# Patient Record
Sex: Female | Born: 1950
Health system: Southern US, Community
[De-identification: ages and names within clinical notes are randomized; demographics above are authoritative.]

## PROBLEM LIST (undated history)

## (undated) DIAGNOSIS — E559 Vitamin D deficiency, unspecified: Secondary | ICD-10-CM

## (undated) DIAGNOSIS — N921 Excessive and frequent menstruation with irregular cycle: Secondary | ICD-10-CM

## (undated) DIAGNOSIS — I2510 Atherosclerotic heart disease of native coronary artery without angina pectoris: Secondary | ICD-10-CM

## (undated) DIAGNOSIS — K5792 Diverticulitis of intestine, part unspecified, without perforation or abscess without bleeding: Secondary | ICD-10-CM

## (undated) DIAGNOSIS — E039 Hypothyroidism, unspecified: Secondary | ICD-10-CM

## (undated) DIAGNOSIS — R011 Cardiac murmur, unspecified: Secondary | ICD-10-CM

## (undated) DIAGNOSIS — G4730 Sleep apnea, unspecified: Secondary | ICD-10-CM

## (undated) DIAGNOSIS — Z9221 Personal history of antineoplastic chemotherapy: Secondary | ICD-10-CM

## (undated) DIAGNOSIS — Z7901 Long term (current) use of anticoagulants: Secondary | ICD-10-CM

## (undated) DIAGNOSIS — I499 Cardiac arrhythmia, unspecified: Secondary | ICD-10-CM

## (undated) DIAGNOSIS — H9190 Unspecified hearing loss, unspecified ear: Secondary | ICD-10-CM

## (undated) DIAGNOSIS — G43909 Migraine, unspecified, not intractable, without status migrainosus: Secondary | ICD-10-CM

## (undated) DIAGNOSIS — N979 Female infertility, unspecified: Secondary | ICD-10-CM

## (undated) DIAGNOSIS — I219 Acute myocardial infarction, unspecified: Secondary | ICD-10-CM

## (undated) DIAGNOSIS — I509 Heart failure, unspecified: Secondary | ICD-10-CM

## (undated) DIAGNOSIS — C50919 Malignant neoplasm of unspecified site of unspecified female breast: Secondary | ICD-10-CM

## (undated) DIAGNOSIS — I10 Essential (primary) hypertension: Secondary | ICD-10-CM

## (undated) DIAGNOSIS — Z923 Personal history of irradiation: Secondary | ICD-10-CM

## (undated) DIAGNOSIS — A64 Unspecified sexually transmitted disease: Secondary | ICD-10-CM

## (undated) DIAGNOSIS — I639 Cerebral infarction, unspecified: Secondary | ICD-10-CM

## (undated) DIAGNOSIS — I4821 Permanent atrial fibrillation: Principal | ICD-10-CM

## (undated) DIAGNOSIS — M1990 Unspecified osteoarthritis, unspecified site: Secondary | ICD-10-CM

## (undated) HISTORY — DX: Essential (primary) hypertension: I10

## (undated) HISTORY — PX: PORT-A-CATH REMOVAL: SHX5289

## (undated) HISTORY — DX: Vitamin D deficiency, unspecified: E55.9

## (undated) HISTORY — DX: Excessive and frequent menstruation with irregular cycle: N92.1

## (undated) HISTORY — DX: Unspecified sexually transmitted disease: A64

## (undated) HISTORY — DX: Migraine, unspecified, not intractable, without status migrainosus: G43.909

## (undated) HISTORY — DX: Female infertility, unspecified: N97.9

## (undated) HISTORY — PX: BREAST LUMPECTOMY: SHX2

## (undated) HISTORY — DX: Permanent atrial fibrillation: I48.21

## (undated) HISTORY — DX: Cerebral infarction, unspecified: I63.9

## (undated) HISTORY — DX: Malignant neoplasm of unspecified site of unspecified female breast: C50.919

## (undated) HISTORY — DX: Acute myocardial infarction, unspecified: I21.9

## (undated) HISTORY — DX: Diverticulitis of intestine, part unspecified, without perforation or abscess without bleeding: K57.92

---
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
## 1974-06-10 DIAGNOSIS — A64 Unspecified sexually transmitted disease: Secondary | ICD-10-CM

## 1974-06-10 HISTORY — DX: Unspecified sexually transmitted disease: A64

## 2000-02-09 DIAGNOSIS — N979 Female infertility, unspecified: Secondary | ICD-10-CM

## 2000-02-09 DIAGNOSIS — G43909 Migraine, unspecified, not intractable, without status migrainosus: Secondary | ICD-10-CM

## 2000-02-09 HISTORY — DX: Female infertility, unspecified: N97.9

## 2000-02-09 HISTORY — DX: Migraine, unspecified, not intractable, without status migrainosus: G43.909

## 2000-02-26 ENCOUNTER — Other Ambulatory Visit: Admission: RE | Admit: 2000-02-26 | Discharge: 2000-02-26 | Payer: Self-pay | Admitting: *Deleted

## 2001-03-11 ENCOUNTER — Other Ambulatory Visit: Admission: RE | Admit: 2001-03-11 | Discharge: 2001-03-11 | Payer: Self-pay | Admitting: Obstetrics and Gynecology

## 2001-06-10 DIAGNOSIS — Z923 Personal history of irradiation: Secondary | ICD-10-CM

## 2001-06-10 DIAGNOSIS — Z9221 Personal history of antineoplastic chemotherapy: Secondary | ICD-10-CM

## 2001-06-10 HISTORY — PX: PORTA CATH INSERTION: CATH118285

## 2001-06-10 HISTORY — DX: Personal history of antineoplastic chemotherapy: Z92.21

## 2001-06-10 HISTORY — DX: Personal history of irradiation: Z92.3

## 2001-11-08 DIAGNOSIS — C50919 Malignant neoplasm of unspecified site of unspecified female breast: Secondary | ICD-10-CM

## 2001-11-08 HISTORY — DX: Malignant neoplasm of unspecified site of unspecified female breast: C50.919

## 2001-11-08 HISTORY — PX: BREAST SURGERY: SHX581

## 2001-11-27 ENCOUNTER — Encounter: Payer: Self-pay | Admitting: General Surgery

## 2001-11-27 ENCOUNTER — Encounter: Admission: RE | Admit: 2001-11-27 | Discharge: 2001-11-27 | Payer: Self-pay | Admitting: General Surgery

## 2001-11-27 ENCOUNTER — Encounter (INDEPENDENT_AMBULATORY_CARE_PROVIDER_SITE_OTHER): Payer: Self-pay | Admitting: Specialist

## 2001-11-30 ENCOUNTER — Other Ambulatory Visit: Admission: RE | Admit: 2001-11-30 | Discharge: 2001-11-30 | Payer: Self-pay | Admitting: *Deleted

## 2001-12-07 ENCOUNTER — Encounter: Payer: Self-pay | Admitting: General Surgery

## 2001-12-07 ENCOUNTER — Encounter: Admission: RE | Admit: 2001-12-07 | Discharge: 2001-12-07 | Payer: Self-pay | Admitting: General Surgery

## 2001-12-08 ENCOUNTER — Encounter (INDEPENDENT_AMBULATORY_CARE_PROVIDER_SITE_OTHER): Payer: Self-pay | Admitting: *Deleted

## 2001-12-08 ENCOUNTER — Encounter: Payer: Self-pay | Admitting: General Surgery

## 2001-12-08 ENCOUNTER — Ambulatory Visit (HOSPITAL_BASED_OUTPATIENT_CLINIC_OR_DEPARTMENT_OTHER): Admission: RE | Admit: 2001-12-08 | Discharge: 2001-12-08 | Payer: Self-pay | Admitting: General Surgery

## 2001-12-15 ENCOUNTER — Ambulatory Visit: Admission: RE | Admit: 2001-12-15 | Discharge: 2002-03-15 | Payer: Self-pay | Admitting: Radiation Oncology

## 2002-01-07 ENCOUNTER — Encounter: Payer: Self-pay | Admitting: General Surgery

## 2002-01-07 ENCOUNTER — Ambulatory Visit (HOSPITAL_BASED_OUTPATIENT_CLINIC_OR_DEPARTMENT_OTHER): Admission: RE | Admit: 2002-01-07 | Discharge: 2002-01-07 | Payer: Self-pay | Admitting: General Surgery

## 2002-01-15 ENCOUNTER — Inpatient Hospital Stay (HOSPITAL_COMMUNITY): Admission: EM | Admit: 2002-01-15 | Discharge: 2002-01-18 | Payer: Self-pay | Admitting: Oncology

## 2002-01-15 ENCOUNTER — Encounter: Payer: Self-pay | Admitting: Oncology

## 2002-02-27 ENCOUNTER — Encounter: Payer: Self-pay | Admitting: *Deleted

## 2002-02-27 ENCOUNTER — Inpatient Hospital Stay (HOSPITAL_COMMUNITY): Admission: EM | Admit: 2002-02-27 | Discharge: 2002-03-01 | Payer: Self-pay

## 2002-03-29 ENCOUNTER — Ambulatory Visit: Admission: RE | Admit: 2002-03-29 | Discharge: 2002-05-28 | Payer: Self-pay | Admitting: Radiation Oncology

## 2002-04-22 ENCOUNTER — Ambulatory Visit (HOSPITAL_BASED_OUTPATIENT_CLINIC_OR_DEPARTMENT_OTHER): Admission: RE | Admit: 2002-04-22 | Discharge: 2002-04-22 | Payer: Self-pay | Admitting: General Surgery

## 2002-05-25 ENCOUNTER — Other Ambulatory Visit: Admission: RE | Admit: 2002-05-25 | Discharge: 2002-05-25 | Payer: Self-pay | Admitting: Obstetrics and Gynecology

## 2002-06-10 HISTORY — PX: PORT-A-CATH REMOVAL: SHX5289

## 2002-07-01 ENCOUNTER — Ambulatory Visit: Admission: RE | Admit: 2002-07-01 | Discharge: 2002-07-01 | Payer: Self-pay | Admitting: Radiation Oncology

## 2002-07-26 ENCOUNTER — Ambulatory Visit (HOSPITAL_COMMUNITY): Admission: RE | Admit: 2002-07-26 | Discharge: 2002-07-26 | Payer: Self-pay | Admitting: Gastroenterology

## 2002-08-24 ENCOUNTER — Encounter: Admission: RE | Admit: 2002-08-24 | Discharge: 2002-08-24 | Payer: Self-pay | Admitting: General Surgery

## 2002-08-24 ENCOUNTER — Encounter: Payer: Self-pay | Admitting: General Surgery

## 2003-06-13 ENCOUNTER — Other Ambulatory Visit: Admission: RE | Admit: 2003-06-13 | Discharge: 2003-06-13 | Payer: Self-pay | Admitting: Obstetrics and Gynecology

## 2003-08-25 ENCOUNTER — Encounter: Admission: RE | Admit: 2003-08-25 | Discharge: 2003-08-25 | Payer: Self-pay | Admitting: General Surgery

## 2003-11-03 ENCOUNTER — Ambulatory Visit (HOSPITAL_BASED_OUTPATIENT_CLINIC_OR_DEPARTMENT_OTHER): Admission: RE | Admit: 2003-11-03 | Discharge: 2003-11-03 | Payer: Self-pay | Admitting: Otolaryngology

## 2003-11-03 ENCOUNTER — Ambulatory Visit (HOSPITAL_COMMUNITY): Admission: RE | Admit: 2003-11-03 | Discharge: 2003-11-03 | Payer: Self-pay | Admitting: Otolaryngology

## 2004-06-22 ENCOUNTER — Ambulatory Visit: Payer: Self-pay | Admitting: Hematology & Oncology

## 2004-06-25 ENCOUNTER — Other Ambulatory Visit: Admission: RE | Admit: 2004-06-25 | Discharge: 2004-06-25 | Payer: Self-pay | Admitting: Obstetrics and Gynecology

## 2004-08-27 ENCOUNTER — Encounter: Admission: RE | Admit: 2004-08-27 | Discharge: 2004-08-27 | Payer: Self-pay | Admitting: General Surgery

## 2004-12-14 ENCOUNTER — Ambulatory Visit: Payer: Self-pay | Admitting: Hematology & Oncology

## 2005-02-27 ENCOUNTER — Encounter: Admission: RE | Admit: 2005-02-27 | Discharge: 2005-02-27 | Payer: Self-pay | Admitting: Family Medicine

## 2005-06-14 ENCOUNTER — Ambulatory Visit: Payer: Self-pay | Admitting: Hematology & Oncology

## 2005-07-08 ENCOUNTER — Other Ambulatory Visit: Admission: RE | Admit: 2005-07-08 | Discharge: 2005-07-08 | Payer: Self-pay | Admitting: Obstetrics and Gynecology

## 2005-08-29 ENCOUNTER — Encounter: Admission: RE | Admit: 2005-08-29 | Discharge: 2005-08-29 | Payer: Self-pay | Admitting: General Surgery

## 2005-09-01 ENCOUNTER — Encounter: Admission: RE | Admit: 2005-09-01 | Discharge: 2005-09-01 | Payer: Self-pay | Admitting: General Surgery

## 2005-12-05 ENCOUNTER — Ambulatory Visit: Payer: Self-pay | Admitting: Hematology & Oncology

## 2005-12-13 LAB — COMPREHENSIVE METABOLIC PANEL
ALT: 15 U/L (ref 0–40)
AST: 16 U/L (ref 0–37)
Albumin: 4.3 g/dL (ref 3.5–5.2)
Alkaline Phosphatase: 46 U/L (ref 39–117)
BUN: 13 mg/dL (ref 6–23)
CO2: 25 mEq/L (ref 19–32)
Calcium: 9.3 mg/dL (ref 8.4–10.5)
Chloride: 104 mEq/L (ref 96–112)
Creatinine, Ser: 0.81 mg/dL (ref 0.40–1.20)
Glucose, Bld: 106 mg/dL — ABNORMAL HIGH (ref 70–99)
Potassium: 4.5 mEq/L (ref 3.5–5.3)
Sodium: 142 mEq/L (ref 135–145)
Total Bilirubin: 0.4 mg/dL (ref 0.3–1.2)
Total Protein: 7.5 g/dL (ref 6.0–8.3)

## 2005-12-13 LAB — CBC WITH DIFFERENTIAL/PLATELET
BASO%: 0.7 % (ref 0.0–2.0)
Basophils Absolute: 0 10*3/uL (ref 0.0–0.1)
EOS%: 1.9 % (ref 0.0–7.0)
Eosinophils Absolute: 0.1 10*3/uL (ref 0.0–0.5)
HCT: 37.3 % (ref 34.8–46.6)
HGB: 12.8 g/dL (ref 11.6–15.9)
LYMPH%: 46.5 % (ref 14.0–48.0)
MCH: 33.4 pg (ref 26.0–34.0)
MCHC: 34.3 g/dL (ref 32.0–36.0)
MCV: 97.4 fL (ref 81.0–101.0)
MONO#: 0.4 10*3/uL (ref 0.1–0.9)
MONO%: 8.4 % (ref 0.0–13.0)
NEUT#: 1.9 10*3/uL (ref 1.5–6.5)
NEUT%: 42.5 % (ref 39.6–76.8)
Platelets: 290 10*3/uL (ref 145–400)
RBC: 3.82 10*6/uL (ref 3.70–5.32)
RDW: 13.8 % (ref 11.3–14.5)
WBC: 4.4 10*3/uL (ref 3.9–10.0)
lymph#: 2 10*3/uL (ref 0.9–3.3)

## 2005-12-13 LAB — LACTATE DEHYDROGENASE: LDH: 129 U/L (ref 94–250)

## 2006-03-02 ENCOUNTER — Encounter: Admission: RE | Admit: 2006-03-02 | Discharge: 2006-03-02 | Payer: Self-pay | Admitting: General Surgery

## 2006-07-11 DIAGNOSIS — E559 Vitamin D deficiency, unspecified: Secondary | ICD-10-CM

## 2006-07-11 HISTORY — DX: Vitamin D deficiency, unspecified: E55.9

## 2006-07-28 ENCOUNTER — Other Ambulatory Visit: Admission: RE | Admit: 2006-07-28 | Discharge: 2006-07-28 | Payer: Self-pay | Admitting: Obstetrics & Gynecology

## 2006-09-01 ENCOUNTER — Encounter: Admission: RE | Admit: 2006-09-01 | Discharge: 2006-09-01 | Payer: Self-pay | Admitting: General Surgery

## 2006-12-10 ENCOUNTER — Ambulatory Visit: Payer: Self-pay | Admitting: Hematology & Oncology

## 2007-01-16 LAB — CBC WITH DIFFERENTIAL/PLATELET
BASO%: 0.3 % (ref 0.0–2.0)
Basophils Absolute: 0 10*3/uL (ref 0.0–0.1)
EOS%: 1.7 % (ref 0.0–7.0)
Eosinophils Absolute: 0.1 10*3/uL (ref 0.0–0.5)
HCT: 37.5 % (ref 34.8–46.6)
HGB: 13.4 g/dL (ref 11.6–15.9)
LYMPH%: 39.6 % (ref 14.0–48.0)
MCH: 33.9 pg (ref 26.0–34.0)
MCHC: 35.6 g/dL (ref 32.0–36.0)
MCV: 95 fL (ref 81.0–101.0)
MONO#: 0.4 10*3/uL (ref 0.1–0.9)
MONO%: 7.1 % (ref 0.0–13.0)
NEUT#: 2.5 10*3/uL (ref 1.5–6.5)
NEUT%: 51.3 % (ref 39.6–76.8)
Platelets: 293 10*3/uL (ref 145–400)
RBC: 3.94 10*6/uL (ref 3.70–5.32)
RDW: 12.8 % (ref 11.3–14.5)
WBC: 4.9 10*3/uL (ref 3.9–10.0)
lymph#: 2 10*3/uL (ref 0.9–3.3)

## 2007-01-16 LAB — COMPREHENSIVE METABOLIC PANEL
ALT: 30 U/L (ref 0–35)
AST: 27 U/L (ref 0–37)
Albumin: 4.5 g/dL (ref 3.5–5.2)
Alkaline Phosphatase: 51 U/L (ref 39–117)
BUN: 10 mg/dL (ref 6–23)
CO2: 23 mEq/L (ref 19–32)
Calcium: 9.4 mg/dL (ref 8.4–10.5)
Chloride: 103 mEq/L (ref 96–112)
Creatinine, Ser: 0.76 mg/dL (ref 0.40–1.20)
Glucose, Bld: 102 mg/dL — ABNORMAL HIGH (ref 70–99)
Potassium: 4.6 mEq/L (ref 3.5–5.3)
Sodium: 140 mEq/L (ref 135–145)
Total Bilirubin: 0.3 mg/dL (ref 0.3–1.2)
Total Protein: 7.5 g/dL (ref 6.0–8.3)

## 2007-06-11 DIAGNOSIS — K5792 Diverticulitis of intestine, part unspecified, without perforation or abscess without bleeding: Secondary | ICD-10-CM

## 2007-06-11 HISTORY — DX: Diverticulitis of intestine, part unspecified, without perforation or abscess without bleeding: K57.92

## 2007-07-30 ENCOUNTER — Other Ambulatory Visit: Admission: RE | Admit: 2007-07-30 | Discharge: 2007-07-30 | Payer: Self-pay | Admitting: Obstetrics and Gynecology

## 2007-09-02 ENCOUNTER — Encounter: Admission: RE | Admit: 2007-09-02 | Discharge: 2007-09-02 | Payer: Self-pay | Admitting: Obstetrics and Gynecology

## 2008-01-13 ENCOUNTER — Ambulatory Visit: Payer: Self-pay | Admitting: Hematology & Oncology

## 2008-01-15 LAB — CBC WITH DIFFERENTIAL (CANCER CENTER ONLY)
BASO#: 0 10*3/uL (ref 0.0–0.2)
BASO%: 0.5 % (ref 0.0–2.0)
EOS%: 2.4 % (ref 0.0–7.0)
Eosinophils Absolute: 0.1 10*3/uL (ref 0.0–0.5)
HCT: 39.5 % (ref 34.8–46.6)
HGB: 13.4 g/dL (ref 11.6–15.9)
LYMPH#: 2.1 10*3/uL (ref 0.9–3.3)
LYMPH%: 41.5 % (ref 14.0–48.0)
MCH: 32.8 pg (ref 26.0–34.0)
MCHC: 34 g/dL (ref 32.0–36.0)
MCV: 97 fL (ref 81–101)
MONO#: 0.4 10*3/uL (ref 0.1–0.9)
MONO%: 7.1 % (ref 0.0–13.0)
NEUT#: 2.5 10*3/uL (ref 1.5–6.5)
NEUT%: 48.5 % (ref 39.6–80.0)
Platelets: 303 10*3/uL (ref 145–400)
RBC: 4.08 10*6/uL (ref 3.70–5.32)
RDW: 11.2 % (ref 10.5–14.6)
WBC: 5.2 10*3/uL (ref 3.9–10.0)

## 2008-01-15 LAB — COMPREHENSIVE METABOLIC PANEL
ALT: 21 U/L (ref 0–35)
AST: 19 U/L (ref 0–37)
Albumin: 4.3 g/dL (ref 3.5–5.2)
Alkaline Phosphatase: 45 U/L (ref 39–117)
BUN: 13 mg/dL (ref 6–23)
CO2: 22 mEq/L (ref 19–32)
Calcium: 9.2 mg/dL (ref 8.4–10.5)
Chloride: 102 mEq/L (ref 96–112)
Creatinine, Ser: 0.81 mg/dL (ref 0.40–1.20)
Glucose, Bld: 121 mg/dL — ABNORMAL HIGH (ref 70–99)
Potassium: 4.4 mEq/L (ref 3.5–5.3)
Sodium: 138 mEq/L (ref 135–145)
Total Bilirubin: 0.4 mg/dL (ref 0.3–1.2)
Total Protein: 7.2 g/dL (ref 6.0–8.3)

## 2008-08-17 ENCOUNTER — Other Ambulatory Visit: Admission: RE | Admit: 2008-08-17 | Discharge: 2008-08-17 | Payer: Self-pay | Admitting: Obstetrics and Gynecology

## 2008-09-02 ENCOUNTER — Encounter: Admission: RE | Admit: 2008-09-02 | Discharge: 2008-09-02 | Payer: Self-pay | Admitting: Obstetrics and Gynecology

## 2009-01-12 ENCOUNTER — Ambulatory Visit: Payer: Self-pay | Admitting: Hematology & Oncology

## 2009-01-13 LAB — CBC WITH DIFFERENTIAL (CANCER CENTER ONLY)
BASO#: 0 10*3/uL (ref 0.0–0.2)
BASO%: 0.5 % (ref 0.0–2.0)
EOS%: 3.3 % (ref 0.0–7.0)
Eosinophils Absolute: 0.2 10*3/uL (ref 0.0–0.5)
HCT: 40.8 % (ref 34.8–46.6)
HGB: 13.6 g/dL (ref 11.6–15.9)
LYMPH#: 2.2 10*3/uL (ref 0.9–3.3)
LYMPH%: 39.5 % (ref 14.0–48.0)
MCH: 32.4 pg (ref 26.0–34.0)
MCHC: 33.3 g/dL (ref 32.0–36.0)
MCV: 97 fL (ref 81–101)
MONO#: 0.5 10*3/uL (ref 0.1–0.9)
MONO%: 8.2 % (ref 0.0–13.0)
NEUT#: 2.7 10*3/uL (ref 1.5–6.5)
NEUT%: 48.5 % (ref 39.6–80.0)
Platelets: 260 10*3/uL (ref 145–400)
RBC: 4.2 10*6/uL (ref 3.70–5.32)
RDW: 10.6 % (ref 10.5–14.6)
WBC: 5.5 10*3/uL (ref 3.9–10.0)

## 2009-01-13 LAB — COMPREHENSIVE METABOLIC PANEL
ALT: 18 U/L (ref 0–35)
AST: 17 U/L (ref 0–37)
Albumin: 4.5 g/dL (ref 3.5–5.2)
Alkaline Phosphatase: 46 U/L (ref 39–117)
BUN: 11 mg/dL (ref 6–23)
CO2: 25 mEq/L (ref 19–32)
Calcium: 9.5 mg/dL (ref 8.4–10.5)
Chloride: 103 mEq/L (ref 96–112)
Creatinine, Ser: 0.75 mg/dL (ref 0.40–1.20)
Glucose, Bld: 102 mg/dL — ABNORMAL HIGH (ref 70–99)
Potassium: 4.7 mEq/L (ref 3.5–5.3)
Sodium: 139 mEq/L (ref 135–145)
Total Bilirubin: 0.8 mg/dL (ref 0.3–1.2)
Total Protein: 7.5 g/dL (ref 6.0–8.3)

## 2009-04-18 ENCOUNTER — Emergency Department (HOSPITAL_COMMUNITY): Admission: EM | Admit: 2009-04-18 | Discharge: 2009-04-18 | Payer: Self-pay | Admitting: Emergency Medicine

## 2009-06-29 ENCOUNTER — Encounter: Admission: RE | Admit: 2009-06-29 | Discharge: 2009-06-29 | Payer: Self-pay | Admitting: Family Medicine

## 2009-09-22 ENCOUNTER — Encounter: Admission: RE | Admit: 2009-09-22 | Discharge: 2009-09-22 | Payer: Self-pay | Admitting: Hematology & Oncology

## 2010-01-12 ENCOUNTER — Ambulatory Visit: Payer: Self-pay | Admitting: Hematology & Oncology

## 2010-01-12 LAB — CBC WITH DIFFERENTIAL (CANCER CENTER ONLY)
BASO#: 0 10*3/uL (ref 0.0–0.2)
BASO%: 0.6 % (ref 0.0–2.0)
EOS%: 1.7 % (ref 0.0–7.0)
Eosinophils Absolute: 0.1 10*3/uL (ref 0.0–0.5)
HCT: 40 % (ref 34.8–46.6)
HGB: 13.4 g/dL (ref 11.6–15.9)
LYMPH#: 2.8 10*3/uL (ref 0.9–3.3)
LYMPH%: 44.8 % (ref 14.0–48.0)
MCH: 33.7 pg (ref 26.0–34.0)
MCHC: 33.4 g/dL (ref 32.0–36.0)
MCV: 101 fL (ref 81–101)
MONO#: 0.4 10*3/uL (ref 0.1–0.9)
MONO%: 6.1 % (ref 0.0–13.0)
NEUT#: 2.9 10*3/uL (ref 1.5–6.5)
NEUT%: 46.8 % (ref 39.6–80.0)
Platelets: 265 10*3/uL (ref 145–400)
RBC: 3.96 10*6/uL (ref 3.70–5.32)
RDW: 10.8 % (ref 10.5–14.6)
WBC: 6.2 10*3/uL (ref 3.9–10.0)

## 2010-01-13 LAB — COMPREHENSIVE METABOLIC PANEL
ALT: 26 U/L (ref 0–35)
AST: 22 U/L (ref 0–37)
Albumin: 4.4 g/dL (ref 3.5–5.2)
Alkaline Phosphatase: 51 U/L (ref 39–117)
BUN: 13 mg/dL (ref 6–23)
CO2: 26 mEq/L (ref 19–32)
Calcium: 9.3 mg/dL (ref 8.4–10.5)
Chloride: 105 mEq/L (ref 96–112)
Creatinine, Ser: 0.86 mg/dL (ref 0.40–1.20)
Glucose, Bld: 106 mg/dL — ABNORMAL HIGH (ref 70–99)
Potassium: 4.6 mEq/L (ref 3.5–5.3)
Sodium: 141 mEq/L (ref 135–145)
Total Bilirubin: 0.6 mg/dL (ref 0.3–1.2)
Total Protein: 7 g/dL (ref 6.0–8.3)

## 2010-01-13 LAB — VITAMIN D 25 HYDROXY (VIT D DEFICIENCY, FRACTURES): Vit D, 25-Hydroxy: 55 ng/mL (ref 30–89)

## 2010-01-17 ENCOUNTER — Encounter: Admission: RE | Admit: 2010-01-17 | Discharge: 2010-01-17 | Payer: Self-pay | Admitting: Hematology & Oncology

## 2010-09-12 LAB — POCT I-STAT, CHEM 8
BUN: 14 mg/dL (ref 6–23)
Calcium, Ion: 1.18 mmol/L (ref 1.12–1.32)
Chloride: 103 mEq/L (ref 96–112)
Creatinine, Ser: 0.8 mg/dL (ref 0.4–1.2)
Glucose, Bld: 96 mg/dL (ref 70–99)
HCT: 40 % (ref 36.0–46.0)
Hemoglobin: 13.6 g/dL (ref 12.0–15.0)
Potassium: 3.7 mEq/L (ref 3.5–5.1)
Sodium: 139 mEq/L (ref 135–145)
TCO2: 26 mmol/L (ref 0–100)

## 2010-09-12 LAB — POCT CARDIAC MARKERS
CKMB, poc: <1 ng/mL — ABNORMAL LOW (ref 1.0–8.0)
Myoglobin, poc: 47.1 ng/mL (ref 12–200)
Troponin i, poc: <0.05 ng/mL (ref 0.00–0.09)

## 2010-10-08 ENCOUNTER — Other Ambulatory Visit: Payer: Self-pay | Admitting: Hematology & Oncology

## 2010-10-08 DIAGNOSIS — Z1231 Encounter for screening mammogram for malignant neoplasm of breast: Secondary | ICD-10-CM

## 2010-10-17 ENCOUNTER — Ambulatory Visit
Admission: RE | Admit: 2010-10-17 | Discharge: 2010-10-17 | Disposition: A | Discharge status: Home / Self Care | Payer: BC Managed Care – PPO | Source: Ambulatory Visit | Attending: Hematology & Oncology | Admitting: Hematology & Oncology

## 2010-10-17 DIAGNOSIS — Z1231 Encounter for screening mammogram for malignant neoplasm of breast: Secondary | ICD-10-CM

## 2010-10-26 NOTE — Op Note (Signed)
   NAME:  Rhonda Paul, Rhonda Paul                        ACCOUNT NO.:  [id number]   MEDICAL RECORD NO.:  [id number]                   PATIENT TYPE:   LOCATION:                                       FACILITY:   PHYSICIAN:  Rose Phi. Maple Hudson, M.D.                DATE OF BIRTH:  1951-01-24   DATE OF PROCEDURE:  01/07/2002  DATE OF DISCHARGE:                                 OPERATIVE REPORT   PREOPERATIVE DIAGNOSIS:  Stage 2 carcinoma of the right breast.   POSTOPERATIVE DIAGNOSIS:  Stage 2 carcinoma of the right breast.   OPERATION:  Insertion of Port-A-Cath.   SURGEON:  Rose Phi. Maple Hudson, M.D.   ANESTHESIA:  MAC.   DESCRIPTION OF PROCEDURE:  The patient was placed on the operating table  with a roll between the shoulders and the left upper chest and neck prepped  and draped in the usual fashion.   Under local anesthesia a left subclavian puncture was carried out without  difficulty and the guidewire inserted and a proper position of the guidewire  confirmed by fl uroscopy.   We then made a transverse incision on the upper chest wall and developed a  pocket for the implantable port.  I tunneled between the newly-developed  pocket and the subclavian puncture site and then we passed the catheter  through this.  We attached it to the [employer] and then anchored the port  in the pocket with two 2-0 Prolene sutures.  The catheter tip was then  trimmed to go to the fourth interspace.  The dilator and peel-away sheath  were then passed over the wire and the wire removed, followed by the  dilator, and then the catheter passed through the peel-away sheath which was  then removed.  Proper positioning of the catheter was then confirmed by  fluoroscopy and there was no kinking in the system.   The incisions were closed with 3-0 Vicryl and subcuticular 4-0 Monocryl and  Steri-Strips.  The system did aspirate for blood, and we then fully  heparinized it and left it accessed for chemotherapy tomorrow.   Dressings  were applied and the patient was transferred to the recovery room in  satisfactory condition, having tolerated the procedure well.                                               Rose Phi. Maple Hudson, M.D.    PRY/MEDQ  D:  01/07/2002  T:  01/13/2002  Job:  [id number]

## 2010-10-26 NOTE — Op Note (Signed)
NAME:  JODELL, WEITMAN                        ACCOUNT NO.:  [id number]   MEDICAL RECORD NO.:  [id number]                   PATIENT TYPE:  AMB   LOCATION:  DSC                                  FACILITY:  [facility]   PHYSICIAN:  Suzanna Obey, M.D.                    DATE OF BIRTH:  [birth year]   DATE OF PROCEDURE:  11/03/2003  DATE OF DISCHARGE:                                 OPERATIVE REPORT   PREOPERATIVE DIAGNOSIS:  Deviated septum and turbinate hypertrophy.   POSTOPERATIVE DIAGNOSIS:  Deviated septum and turbinate hypertrophy.   OPERATION PERFORMED:  Septoplasty and submucous resection of inferior  turbinates.   SURGEON:  Suzanna Obey, M.D.   ANESTHESIA:  General endotracheal tube.   ESTIMATED BLOOD LOSS:  Less than 10 mL.   INDICATIONS FOR PROCEDURE:  This is a 60 year old who has had problems with  chronic nasal obstruction and congestion.  It has been refractory to medical  therapy.  The patient was informed of the risks and benefits of the  procedure including bleeding, infection, perforation of the septum, change  in the external appearance of the nose, tip ptosis chronic crusting and  drying, numbness of the teeth, and risks of the anesthetic.  All questions  were answered and consent was obtained.   DESCRIPTION OF PROCEDURE:  The patient was taken to the operating room and  placed in supine position.  After adequate general endotracheal tube  anesthesia, she was placed in supine position, prepped and draped in the  usual sterile manner.  The oxymetazoline pledgets were placed into the nose  bilaterally and then the septum and inferior turbinates were injected with  1% lidocaine with 1:100,000 epinephrine.  The left hemitransfixion incision  was performed raising mucoperichondrial and mucoperiosteal flap.  The  cartilage was divided about 1 cm posterior to the caudal strut and this  cartilage was removed with a Therapist, nutritional.  The posterior bone was removed  with a  Jansen-Middleton forceps after raising opposite flap.  The spur was  removed with the Glorious Peach as well as a Jansen's.  This corrected the septal  deflection.  The turbinates were infractured, midline incision made with 15  blade, mucosal flap elevated superiorly, inferior mucosa and bone were  removed with the turbinate scissors.  The edge was cauterized with suction  cautery and both flaps were laid back down over the raw surface and  outfractured.  Hemitransfixion incision was closed with interrupted 4-0  chromic and 4-0 plain gut quilting stitch placed to the  septum.  The Telfa roll soaked in bacitracin were placed in the nose  bilaterally and secured with a 3-0 nylon.  Nasopharynx was cleaned out of  all blood and debris in the oral cavity and oropharynx cleaned out under  direct visualization.  The patient was awakened and brought to recovery in  stable condition.  Counts correct.  Suzanna Obey, M.D.    Cordelia Pen  D:  11/03/2003  T:  11/03/2003  Job:  [id number]   cc:   Jethro Bastos, M.D.  [address]  [place]  [place] [address]  Fax: [phone]

## 2010-10-26 NOTE — Op Note (Signed)
NAME:  Rhonda Paul, Rhonda Paul                        ACCOUNT NO.:  [id number]   MEDICAL RECORD NO.:  [id number]                   PATIENT TYPE:  AMB   LOCATION:  ENDO                                 FACILITY:  [facility]   PHYSICIAN:  Anselmo Rod, M.D.               DATE OF BIRTH:  [birth year]   DATE OF PROCEDURE:  07/26/2002  DATE OF DISCHARGE:                                 OPERATIVE REPORT   PROCEDURE:  Screening colonoscopy.   ENDOSCOPIST:  Anselmo Rod, M.D.   INSTRUMENT USED:  Pediatric adjustable Olympus colonoscope.   INDICATION FOR PROCEDURE:  A 60 year old white female who underwent  screening colonoscopy.  The patient has a personal history of breast cancer  and a family history of colon cancer in the maternal grandmother and  maternal aunt.  Rule out colonic polyps, masses, etc.   PREPROCEDURE PREPARATION:  Informed consent was procured from the patient.  The patient had fasted for eight hours prior to the procedure and prepped  with a bottle of magnesium citrate and a gallon of GoLYTELY the night prior  to the procedure.   PREPROCEDURE PHYSICAL:  VITAL SIGNS:  The patient had stable vital signs.  NECK:  Supple.  CHEST:  Clear to auscultation.  S1, S2 regular.  ABDOMEN:  Soft with normal bowel sounds.   DESCRIPTION OF PROCEDURE:  The patient was placed in the left lateral  decubitus position and sedated with 60 mg of Demerol and 6 mg of Versed  intravenously.  Once the patient was adequately sedate and maintained on low-  flow oxygen and continuous cardiac monitoring, the Olympus video colonoscope  was advanced from the rectum to the cecum and terminal ileum without  difficulty.  The patient had a fairly good prep.  No masses, polyps,  erosions, or ulcerations were seen.  A few small early diverticula were seen  in the left colon.  The patient had small internal hemorrhoids seen on  retroflexion in the rectum and a small external hemorrhoid seen on  withdrawal of  the scope.  The rest of the colon including the transverse  colon, right colon, cecum,a nondistended terminal ileum appeared normal and  without lesions.   IMPRESSION:  1. A healthy-appearing colon except for a few early left-sided diverticula.  2. Small internal and external hemorrhoid.  3. Normal terminal ileum.           RECOMMENDATIONS:  1. A high-fiber diet with liberal fluid intake has been advocated, 15-20 g     of fiber have been recommended in the diet.  2. Repeat CRC screening is recommended in the next five years unless the     patient develops any abnormal symptoms in the interim.  3. Outpatient follow-up on a p.r.n. basis.  Anselmo Rod, M.D.    JNM/MEDQ  D:  07/26/2002  T:  07/26/2002  Job:  [id number]   cc:   Laqueta Linden, M.D.  [address]  [place]  [place] [address]  Fax: [phone]   Rose Phi. Myna Hidalgo, M.D.  [address] [facility]  [place], [place] [address]  Fax: [phone]   Rose Phi. Young, M.D.  [address]  [place]  [place] [address]  Fax: [phone]

## 2010-10-26 NOTE — Discharge Summary (Signed)
   NAME:  Rhonda Paul, Rhonda Paul                        ACCOUNT NO.:  [id number]   MEDICAL RECORD NO.:  [id number]                   PATIENT TYPE:  INP   LOCATION:  0281                                 FACILITY:  [facility]   PHYSICIAN:  Rose Phi. Myna Hidalgo, M.D.              DATE OF BIRTH:  [birth year]   DATE OF ADMISSION:  02/27/2002  DATE OF DISCHARGE:  03/01/2002                                 DISCHARGE SUMMARY   FINAL DIAGNOSES:  1. Fever of unknown etiology.  2. Neutropenia secondary to chemotherapy.  3. Anemia secondary to chemotherapy.  4. Stage IIA infiltrating ductal carcinoma of the right breast.   CONDITION ON DISCHARGE:  Stable.   ACTIVITY:  As tolerated.   DIET:  No restrictions.   FOLLOW UP:  I will see the patient back in the office on the 26th prior to  her fifth cycle of chemotherapy.   DISCHARGE MEDICATIONS:  1. Coumadin 1 mg p.o. q.d.  2. Nasonex nasal spray as needed.  3. Skelaxin as needed.   ADMISSION HISTORY AND PHYSICAL:  In the admission H&P, please see that for  full details.   HOSPITAL COURSE:  The patient received her fourth cycle of chemotherapy  (FEC) on February 19, 2002.  She did well during the week but then on the  20th, she began to have some sore throat.  She felt worse throughout the  evening.  On the morning of February 27, 2002 she went to the emergency  room with a temperature of 101.1.  Her white cell count was 500 with an ANC  of less than 100.  She had been receiving Neulasta.  She also was somewhat  anemic with a hemoglobin of 9.4.  After hydration, her hemoglobin was 7.9.  She received a dose of Procrit in the hospital.   She was started on Neupogen.  Cultures were taken, all were negative.   She began to feel better.  She had no problems with nausea and vomiting.  There was no diarrhea.  She had no headache.  She just felt a little  fatigued.   She subsequently defervesced.  She was afebrile on discharge.   Her CBC on discharge  showed a white cell count of 4.7, hemoglobin 8.1,  hematocrit 23.4 and platelet count of 79,000.                                                 Rose Phi. Myna Hidalgo, M.D.    PRE/MEDQ  D:  03/01/2002  T:  03/01/2002  Job:  [id number]

## 2010-10-26 NOTE — H&P (Addendum)
NAME:  Rhonda Paul, Rhonda Paul                        ACCOUNT NO.:  [id number]   MEDICAL RECORD NO.:  [id number]                   PATIENT TYPE:  INP   LOCATION:  0259                                 FACILITY:  [facility]   PHYSICIAN:  Lennis P. Darrold Span, M.D.             DATE OF BIRTH:  August 23, 1950   DATE OF ADMISSION:  01/15/2002  DATE OF DISCHARGE:                                HISTORY & PHYSICAL   HISTORY OF PRESENT ILLNESS:  The patient is a 60 year old white female  recently begun on adjuvant chemotherapy for breast cancer by Dr. Myna Hidalgo,  and also followed by Drs. Francina Ames, Tomasita Crumble, Hillis Range, who was admitted with febrile neutropenia, day #8, cycle #1, adjuvant  Cytoxan, epirubicin, F-5U for T2 N0 right breast carcinoma.   The patient had initially been diagnosed [facility].  She subsequently had lumpectomy with three sentinel node  evaluation by Dr. Francina Ames on 12/08/01.  Tumor was 3.5 cm invasive ductal  grade 3, with high grade DCIF, ERPR negative, Her-2 0, high proliferation  fraction.  She had Port-A-Cath placed, and received her first chemotherapy  with epirubicin, 5-FU, Cytoxan on 01/08/02, at our office, with CBC then  hemoglobin 13.6, white count 6.2, platelets 245.  She did receive Neulasta  and ____ QA MARKER: 97 ____on 8/2  diarrhea or constipation.  Last evening she felt somewhat feverish and cold  with a sore throat that has persisted, but did not take her temperature  then.  This morning, she had a temperature of 101 degrees without shaking  chills.  She has had some sinus congestion and drainage, consistent with her  usual environmental allergies, mild headache not different from what she  usually has with the allergies, but not improved with Tylenol that she used  last evening (she has been avoiding the nonsteroidals since her  chemotherapy, which have usually been helpful for her).  No cough or  shortness of breath, no problem with the Port-A-Cath site which did have  good blood return at access in the office now.  No urinary symptoms, no  bleeding on 1 mg of Coumadin for the Port-A-Cath.   ALLERGIES:  No known drug allergies, though she is allergic to adhesive and  all wool, including Lanolin.  Environmental allergies:  She has been using  Nasonex p.r.n.   PAST SURGICAL HISTORY:  Had an appendectomy in 1959.   REVIEW OF SYMPTOMS:  As above.   FAMILY HISTORY:  Positive for an aunt with breast cancer.   SOCIAL HISTORY:  No tobacco, occasional alcohol, is a Therapist, occupational, is  married, no children.   PHYSICAL EXAMINATION:  GENERAL:  She is a pleasant lady who appears ill, but not in acute distress.  Acute historian, accompanied by her husband who is very supportive.  VITAL SIGNS:  Temperature now 100.2, blood pressure 112/66,  which is about  her baseline.  Heart rate 100 and regular.  Respirations 20 and not labored  on room air.  Weight is stable at 151.  No alopecia.  HEENT:  Pupils equal and reactive.  She is not icteric.  Posterior pharynx  has very marked erythema without exudate or ulcerations.  Mouth is slightly  dry.  NECK:  Supple, no peripheral adenopathy.  LUNGS:  Clear.  Port site is unremarkable.  BREASTS:  Not repeated in the office.  ABDOMEN:  Soft and nontender, few bowel sounds, no hepatosplenomegaly.  EXTREMITIES:  Lower extremities without edema or cords.  SKIN:  Not remarkable.  NEUROLOGIC:  Nonfocal.   LABORATORY DATA:  Today, white count 0.4, absolute neutrophil count 0.008,  hemoglobin 12.6, platelets 101.  Chemistries from 12/07/01, most recent on  our chart, BUN of 11, creatinine 0.9, otherwise all essentially  unremarkable.   IMPRESSION AND PLAN:  1. Fever and profound neutropenia, day #8, cycle #1, CEF chemotherapy in a     patient who did receive Neulasta day #2.  Admit, culture, empiric     intravenous antibiotics, given Neupogen daily  for now.  Source may be     chemotherapy induced esophagitis.  2. T2 N0 breast carcinoma.  3. Port-A-Cath in place.  4. History of environmental allergies.                                               Lennis P. Darrold Span, M.D.    LPL/MEDQ  D:  01/15/2002  T:  01/15/2002  Job:  [id number]   cc:   Rose Phi. Myna Hidalgo, M.D.  [address] [facility]  [place], [place] [address]  Fax: [phone]   Rose Phi. Maple Hudson, M.D.  Fax: [phone]   Jethro Bastos, M.D.   Maryln Gottron, M.D.

## 2010-10-26 NOTE — Op Note (Signed)
[place]. [facility]  Patient:    OMER, MONTER Visit Number: [id number] MRN: [id number]          Service Type: DSU Location: [facility] Attending Physician:  Janalyn Rouse Dictated by:   Rose Phi. Maple Hudson, M.D. Proc. Date: 12/08/01 Admit Date:  12/08/2001 Discharge Date: 12/08/2001   CC:         Jethro Bastos, M.D.  Andres Ege, M.D.   Operative Report  PREOPERATIVE DIAGNOSIS:  Clinical stage I carcinoma of the right breast.  POSTOPERATIVE DIAGNOSIS:  Clinical stage I carcinoma of the right breast.  OPERATION PERFORMED: 1. Blue dye injection. 2. Right partial mastectomy. 3. Right axillary sentinel lymph node biopsy.  SURGEON:  Rose Phi. Maple Hudson, M.D.  ANESTHESIA:  General.  INDICATIONS FOR PROCEDURE:  This patient had presented with a palpable mass at the 12 oclock position of the right breast which, on core biopsy, was an invasive mammary cancer.  She is scheduled now for partial mastectomy and sentinel lymph node biopsy.  DESCRIPTION OF PROCEDURE:  Prior to coming to the operating room 1 mCi of technetium sulfur colloid was injected intradermally.  We injected the blue dye after she had undergone general anesthesia.  After suitable general anesthesia was induced the patient was placed in supine position with the right arm extended on the arm board.  4 cc of Lymphazurin blue was then injected in the subareolar and the breast massaged for about three minutes.  We then massaged for about 3 minutes.  We then prepped and draped the breast and axilla.  A short transverse axillary incision was made with dissection through the subcutaneous tissue to the clavipectoral fascia.  Prior to making the incision, we had scanned the supraclavicular, internal mammary and axilla and only a hot spot was in the axilla.  We then exposed clavipectoral fascia and incised it and ended up with three separate hot and blue lymph nodes.  The primary node  had a count of about 2000 and was quite blue.  There was a second sentinel which had a count of about 1100 and also blue.  Then there was a third sentinel node which was partially blue and had a count of about 3000.  There were no other counts and no palpable nodes in the axilla after removing these three.  That incision was closed with 3-0 Vicryl and subcuticular 4-0 Monocryl.  We then did the right partial mastectomy taking a wedge of skin over the palpable mass at the 12 oclock position.  I did a wide excision of this and then oriented the specimen for the pathologist.  Touch Preps showed clean margins.  With good good hemostasis, we closed the subcutaneous tissues with 3-0 Vicryl suture and the skin with subcuticular 4-0 Monocryl and Steri-Strips were used for both incisions.  Dressings applied and the patient transferred to the recovery room in satisfactory condition having tolerated the procedure well. Dictated by:   Rose Phi. Maple Hudson, M.D. Attending Physician:  Janalyn Rouse DD:  12/08/01 TD:  12/09/01 Job: [id number]

## 2010-10-26 NOTE — H&P (Signed)
   NAME:  Rhonda Paul, Rhonda Paul                        ACCOUNT NO.:  [id number]   MEDICAL RECORD NO.:  [id number]                   PATIENT TYPE:  INP   LOCATION:  0101                                 FACILITY:  [facility]   PHYSICIAN:  Merilynn Finland, M.D.                DATE OF BIRTH:  1950/12/20   DATE OF ADMISSION:  02/27/2002  DATE OF DISCHARGE:                                HISTORY & PHYSICAL   HISTORY OF PRESENT ILLNESS:  The patient is a very pleasant 60 year old  female with a history of a stage II breast cancer now status post fourth  cycle of FEC chemotherapy.  The last dose was given last week.  After cycle  of chemotherapy, she has suffered profound neutropenia even in spite of  Neulasta support which she also received 24 hours after her last dose.  She  comes in today complaining of a history of fever and chills.  She has no  other symptomatology, no cough, and no difficulty urinating, and no bowel  and bladder disturbances.  She is admitted for febrile neutropenia with a  white count of 0.5.   PAST MEDICAL HISTORY:  1. Breast cancer diagnosed June 2003 diagnosed as a T2 N0 right breast     cancer.  2. Appendectomy in 1959.   ALLERGIES:  No known drug allergies.   MEDICATIONS:  1. Coumadin  2. Nasonex.   SOCIAL HISTORY:  No tobacco.   REVIEW OF SYSTEMS:  Fever, chills, and weakness.   PHYSICAL EXAMINATION:  VITAL SIGNS:  T-max was 101.1, blood pressure 100/69,  pulse 69, respirations 18.  HEENT:  Oropharynx clear.  CHEST:  Clear.  HEART:  Regular rate and rhythm.  ABDOMEN:  Soft, nontender, no hepatosplenomegaly.  EXTREMITIES:  Range of motion, no edema.   LABORATORY DATA:  White count 0.5, hemoglobin 9.4, platelet count 87.    IMPRESSION:  Febrile neutropenia.  We are going to start the neutropenia  pathway to include cefepime 2 grams IV q.12 h., Neupogen __________ , and IV  fluids.                                               Merilynn Finland, M.D.    JSS/MEDQ  D:  02/27/2002  T:  02/27/2002  Job:  [id number]   cc:   Rose Phi. Myna Hidalgo, M.D.  [address] [facility]  [place], [place] [address]  Fax: [phone]

## 2010-10-26 NOTE — Discharge Summary (Signed)
   NAME:  Rhonda Paul, Rhonda Paul                        ACCOUNT NO.:  [id number]   MEDICAL RECORD NO.:  [id number]                   PATIENT TYPE:  INP   LOCATION:  0259                                 FACILITY:  [facility]   PHYSICIAN:  Rose Phi. Myna Hidalgo, M.D.              DATE OF BIRTH:  Apr 06, 1951   DATE OF ADMISSION:  01/15/2002  DATE OF DISCHARGE:  01/18/2002                                 DISCHARGE SUMMARY   DISCHARGE DIAGNOSES:  1. Febrile neutropenia.  2. Stage II (T2, N0, M0) infiltrating ductal carcinoma of the right breast.  3. Pharyngitis.   CONDITION ON DISCHARGE:  Stable.   ACTIVITY:  As tolerated.   DIET:  No restrictions.   FOLLOW-UP:  The patient will come back to the office her for her  chemotherapy on January 22, 2002.   MEDICATIONS:  1. Coumadin 1 mg p.o. q.d.  2. Nasonex nasal spray as needed.  3. Skelaxin 800 mg p.o. q.8h. p.r.n.   HISTORY OF PRESENT ILLNESS:  Admission history and physical are stated in  the admit note.  Please see that for further details.   HOSPITAL COURSE:  The patient was admitted because of a temperature of 101.  She had an ANC of basically 8.  She did receive chemotherapy on January 08, 2002.  She got Neulasta.  She did complain of odynophagia.  She had cultures  that were all negative.  She had a strep test from her throat which was  negative.  She was placed on antibiotics with vancomycin and Maxipime.  She  also received Diflucan.  She defervesced quite quickly.  She did receive  Neupogen during the hospital stay.  Her white cell count increased quite  nicely.  On January 17, 2002, her white cell count was 1700.  On the day of  discharge her white cell count was 10,000.  Her hemoglobin was 10.4,  hematocrit was 30, platelet count was 154.  She had normal electrolytes.  Her SGPT was slightly elevated at 154.  SGOT was 41.   She had a chest x-ray which was negative.   The pharyngitis resolved.  She was eating.  She had no diarrhea.  She  had  one episode on January 16, 2002.  This was relieved with Imodium.   She felt well enough that she wished to be discharged on the morning of  January 18, 2002.  I felt that this would be okay.                                                    Rose Phi. Myna Hidalgo, M.D.    PRE/MEDQ  D:  01/18/2002  T:  01/21/2002  Job:  [id number]

## 2011-01-18 ENCOUNTER — Other Ambulatory Visit: Payer: Self-pay | Admitting: Hematology & Oncology

## 2011-01-18 ENCOUNTER — Encounter (HOSPITAL_BASED_OUTPATIENT_CLINIC_OR_DEPARTMENT_OTHER): Payer: BC Managed Care – PPO | Admitting: Hematology & Oncology

## 2011-01-18 DIAGNOSIS — Z853 Personal history of malignant neoplasm of breast: Secondary | ICD-10-CM

## 2011-01-18 LAB — CBC WITH DIFFERENTIAL (CANCER CENTER ONLY)
BASO#: 0 10*3/uL (ref 0.0–0.2)
BASO%: 0.6 % (ref 0.0–2.0)
EOS%: 1.7 % (ref 0.0–7.0)
Eosinophils Absolute: 0.1 10*3/uL (ref 0.0–0.5)
HCT: 40.8 % (ref 34.8–46.6)
HGB: 14.3 g/dL (ref 11.6–15.9)
LYMPH#: 2.6 10*3/uL (ref 0.9–3.3)
LYMPH%: 38.9 % (ref 14.0–48.0)
MCH: 34.5 pg — ABNORMAL HIGH (ref 26.0–34.0)
MCHC: 35 g/dL (ref 32.0–36.0)
MCV: 99 fL (ref 81–101)
MONO#: 0.7 10*3/uL (ref 0.1–0.9)
MONO%: 10.7 % (ref 0.0–13.0)
NEUT#: 3.2 10*3/uL (ref 1.5–6.5)
NEUT%: 48.1 % (ref 39.6–80.0)
Platelets: 244 10*3/uL (ref 145–400)
RBC: 4.14 10*6/uL (ref 3.70–5.32)
RDW: 12.3 % (ref 11.1–15.7)
WBC: 6.6 10*3/uL (ref 3.9–10.0)

## 2011-01-18 LAB — COMPREHENSIVE METABOLIC PANEL
ALT: 21 U/L (ref 0–35)
AST: 19 U/L (ref 0–37)
Albumin: 4.7 g/dL (ref 3.5–5.2)
Alkaline Phosphatase: 52 U/L (ref 39–117)
BUN: 15 mg/dL (ref 6–23)
CO2: 25 mEq/L (ref 19–32)
Calcium: 9.9 mg/dL (ref 8.4–10.5)
Chloride: 102 mEq/L (ref 96–112)
Creatinine, Ser: 0.83 mg/dL (ref 0.50–1.10)
Glucose, Bld: 98 mg/dL (ref 70–99)
Potassium: 4.7 mEq/L (ref 3.5–5.3)
Sodium: 138 mEq/L (ref 135–145)
Total Bilirubin: 0.4 mg/dL (ref 0.3–1.2)
Total Protein: 7.5 g/dL (ref 6.0–8.3)

## 2011-01-18 LAB — VITAMIN D 25 HYDROXY (VIT D DEFICIENCY, FRACTURES): Vit D, 25-Hydroxy: 61 ng/mL (ref 30–89)

## 2011-10-07 ENCOUNTER — Other Ambulatory Visit: Payer: Self-pay | Admitting: Obstetrics & Gynecology

## 2011-10-07 DIAGNOSIS — Z1231 Encounter for screening mammogram for malignant neoplasm of breast: Secondary | ICD-10-CM

## 2011-10-21 ENCOUNTER — Ambulatory Visit
Admission: RE | Admit: 2011-10-21 | Discharge: 2011-10-21 | Disposition: A | Discharge status: Home / Self Care | Payer: BC Managed Care – PPO | Source: Ambulatory Visit | Attending: Obstetrics & Gynecology | Admitting: Obstetrics & Gynecology

## 2011-10-21 DIAGNOSIS — Z1231 Encounter for screening mammogram for malignant neoplasm of breast: Secondary | ICD-10-CM

## 2011-10-21 LAB — HM MAMMOGRAPHY: HM Mammogram: NEGATIVE

## 2011-11-14 LAB — HM PAP SMEAR: HM Pap smear: NORMAL

## 2012-01-17 ENCOUNTER — Ambulatory Visit (HOSPITAL_BASED_OUTPATIENT_CLINIC_OR_DEPARTMENT_OTHER): Payer: BC Managed Care – PPO | Admitting: Medical

## 2012-01-17 ENCOUNTER — Other Ambulatory Visit (HOSPITAL_BASED_OUTPATIENT_CLINIC_OR_DEPARTMENT_OTHER): Payer: BC Managed Care – PPO | Admitting: Lab

## 2012-01-17 VITALS — BP 109/78 | HR 68 | Temp 97.2°F | Resp 18 | Ht 66.0 in | Wt 168.0 lb

## 2012-01-17 DIAGNOSIS — C50911 Malignant neoplasm of unspecified site of right female breast: Secondary | ICD-10-CM

## 2012-01-17 DIAGNOSIS — Z171 Estrogen receptor negative status [ER-]: Secondary | ICD-10-CM

## 2012-01-17 DIAGNOSIS — Z853 Personal history of malignant neoplasm of breast: Secondary | ICD-10-CM

## 2012-01-17 LAB — CBC WITH DIFFERENTIAL (CANCER CENTER ONLY)
BASO#: 0 10*3/uL (ref 0.0–0.2)
BASO%: 0.6 % (ref 0.0–2.0)
EOS%: 1.8 % (ref 0.0–7.0)
Eosinophils Absolute: 0.1 10*3/uL (ref 0.0–0.5)
HCT: 38.9 % (ref 34.8–46.6)
HGB: 13.2 g/dL (ref 11.6–15.9)
LYMPH#: 2.7 10*3/uL (ref 0.9–3.3)
LYMPH%: 43.3 % (ref 14.0–48.0)
MCH: 34 pg (ref 26.0–34.0)
MCHC: 33.9 g/dL (ref 32.0–36.0)
MCV: 100 fL (ref 81–101)
MONO#: 0.5 10*3/uL (ref 0.1–0.9)
MONO%: 8.3 % (ref 0.0–13.0)
NEUT#: 2.9 10*3/uL (ref 1.5–6.5)
NEUT%: 46 % (ref 39.6–80.0)
Platelets: 248 10*3/uL (ref 145–400)
RBC: 3.88 10*6/uL (ref 3.70–5.32)
RDW: 12.3 % (ref 11.1–15.7)
WBC: 6.3 10*3/uL (ref 3.9–10.0)

## 2012-01-17 NOTE — Progress Notes (Signed)
Patient Name : Rhonda Paul, Rhonda Paul MR #[id number] DOB: May 22, 1951 Encounter Date: 01/17/2012 Dictated by Eunice Blase, PA-C  Diagnosis: Stage II a (T2, N0M0) ductal carcinoma of the right breast.  Current therapy observation.  Interim history: Ms. Venturella presents today for an office followup visit.  Overall, she does not report any new problems.  She has been doing quite well.  She's not having any problems with any bony pain.  She continues on her calcium and vitamin D.  We are checking her vitamin D level today.  She is now 10 years out from treatment.  We are following up with her on a yearly basis.  She still continues to receive yearly mammograms.  Her last mammogram was back in April and it was negative.  She denies any changes in bowel or bladder, habits.  She denies any headaches, visual changes, or any rashes.  She denies any nausea, vomiting, diarrhea, constipation, cough, chest pain, shortness of breath.  She denies any fevers, chills, or night sweats.  Review of Systems: Pt. Denies any changes in their vision, hearing, adenopathy, fevers, chills, nausea, vomiting, diarrhea, constipation, chest pain, shortness of breath, passing blood, passing out, blacking out,  any changes in skin, joints, neurologic or psychiatric except as noted.  Physical Exam: This is a pleasant, 61 year old, well-developed, well-nourished, white female, in no obvious distress Vitals: Temperature 97.2 degrees.  Pulse 60, respirations 18, blood pressure 109/78, weight 168 pounds HEENT reveals a normocephalic, atraumatic skull, no scleral icterus, no oral lesions  Neck is supple without any cervical or supraclavicular adenopathy.  Lungs are clear to auscultation bilaterally. There are no wheezes, rales or rhonci Cardiac is regular rate and rhythm with a normal S1 and S2. There are no murmurs, rubs, or bruits.  Abdomen is soft with good bowel sounds, there is no palpable mass. There is no palpable hepatosplenomegaly.  There is no palpable fluid wave.  Musculoskeletal no tenderness of the spine, ribs, or hips.  Extremities there are no clubbing, cyanosis, or edema.  Skin no petechia, purpura or ecchymosis Neurologic is nonfocal. Breast exam:Right-well healed lumpectomy at the 12 o clock position.  There is slight contraction of the right breast.  There is no right axillary adenopathy. Left breast exam-there are no masses, edema, or erythema.  There is no left axillary adenopathy.  Laboratory Data: White count 6.3, hemoglobin 13.2, hematocrit 38.9, platelets 248,000  Current Outpatient Prescriptions on File Prior to Visit  Medication Sig Dispense Refill  . Calcium Carbonate-Vit D-Min (CALCIUM 1200) 1200-1000 MG-UNIT CHEW Chew by mouth 3 (three) times daily.      Marland Kitchen levothyroxine (SYNTHROID, LEVOTHROID) 50 MCG tablet 50 mcg daily.       . metoprolol succinate (TOPROL-XL) 50 MG 24 hr tablet Take 50 mg by mouth. Take 1 1/2 tabs 75 mg daily.       Assessment/Plan: This is a pleasant, 61 year old, white female, with the following issues: #1 stage II A.  Infiltrating ductal carcinoma of the right breast.  She is node negative.  She was ER negative.  She did complete 6 cycles chemotherapy with CEF followed by radiation therapy.  She continues to be in remission.  Given the fact that she is ER negative.  We will like to believe that she is cured.  #2 followup-we will see her back in one year, but before then should there be questions or concerns

## 2012-01-18 LAB — LACTATE DEHYDROGENASE: LDH: 149 U/L (ref 94–250)

## 2012-01-18 LAB — COMPREHENSIVE METABOLIC PANEL
ALT: 40 U/L — ABNORMAL HIGH (ref 0–35)
AST: 34 U/L (ref 0–37)
Albumin: 4.1 g/dL (ref 3.5–5.2)
Alkaline Phosphatase: 46 U/L (ref 39–117)
BUN: 11 mg/dL (ref 6–23)
CO2: 26 mEq/L (ref 19–32)
Calcium: 9.8 mg/dL (ref 8.4–10.5)
Chloride: 104 mEq/L (ref 96–112)
Creatinine, Ser: 0.91 mg/dL (ref 0.50–1.10)
Glucose, Bld: 94 mg/dL (ref 70–99)
Potassium: 4.8 mEq/L (ref 3.5–5.3)
Sodium: 140 mEq/L (ref 135–145)
Total Bilirubin: 0.5 mg/dL (ref 0.3–1.2)
Total Protein: 7.4 g/dL (ref 6.0–8.3)

## 2012-01-18 LAB — VITAMIN D 25 HYDROXY (VIT D DEFICIENCY, FRACTURES): Vit D, 25-Hydroxy: 50 ng/mL (ref 30–89)

## 2012-01-20 ENCOUNTER — Telehealth: Payer: Self-pay | Admitting: *Deleted

## 2012-01-20 NOTE — Telephone Encounter (Signed)
Called patient to let her know that her labs and Vitamin d are normal

## 2012-01-20 NOTE — Telephone Encounter (Signed)
Message copied by Anselm Jungling on Mon Jan 20, 2012  9:22 AM ------      Message from: Josph Macho      Created: Sun Jan 19, 2012  8:12 PM       Call - labs and vit D are normal.  pete

## 2012-09-28 ENCOUNTER — Other Ambulatory Visit: Payer: Self-pay

## 2012-09-28 DIAGNOSIS — Z1231 Encounter for screening mammogram for malignant neoplasm of breast: Secondary | ICD-10-CM

## 2012-09-29 ENCOUNTER — Encounter: Payer: Self-pay | Admitting: *Deleted

## 2012-10-01 ENCOUNTER — Ambulatory Visit: Payer: Self-pay | Admitting: Nurse Practitioner

## 2012-10-21 ENCOUNTER — Ambulatory Visit
Admission: RE | Admit: 2012-10-21 | Discharge: 2012-10-21 | Disposition: A | Discharge status: Home / Self Care | Payer: BC Managed Care – PPO | Source: Ambulatory Visit

## 2012-10-21 DIAGNOSIS — Z1231 Encounter for screening mammogram for malignant neoplasm of breast: Secondary | ICD-10-CM

## 2012-11-08 HISTORY — PX: COLONOSCOPY: SHX174

## 2012-11-10 ENCOUNTER — Encounter: Payer: Self-pay | Admitting: *Deleted

## 2012-11-17 ENCOUNTER — Ambulatory Visit: Payer: Self-pay | Admitting: Nurse Practitioner

## 2012-12-07 LAB — HM COLONOSCOPY: HM Colonoscopy: NORMAL

## 2012-12-10 ENCOUNTER — Other Ambulatory Visit: Payer: Self-pay | Admitting: Nurse Practitioner

## 2012-12-10 NOTE — Telephone Encounter (Signed)
last aex 11/14/2011. next aex 12/28/2012 with P. Berneice Gandy. Levothyroxine #30/0 refills sent to [facility] to maintain until next aex.

## 2012-12-28 ENCOUNTER — Ambulatory Visit (INDEPENDENT_AMBULATORY_CARE_PROVIDER_SITE_OTHER): Payer: BC Managed Care – PPO | Admitting: Nurse Practitioner

## 2012-12-28 ENCOUNTER — Encounter: Payer: Self-pay | Admitting: Nurse Practitioner

## 2012-12-28 VITALS — BP 124/68 | HR 74 | Resp 12 | Ht 65.0 in | Wt 165.4 lb

## 2012-12-28 DIAGNOSIS — N952 Postmenopausal atrophic vaginitis: Secondary | ICD-10-CM

## 2012-12-28 DIAGNOSIS — E039 Hypothyroidism, unspecified: Secondary | ICD-10-CM

## 2012-12-28 DIAGNOSIS — Z0000 Encounter for general adult medical examination without abnormal findings: Secondary | ICD-10-CM

## 2012-12-28 DIAGNOSIS — Z01419 Encounter for gynecological examination (general) (routine) without abnormal findings: Secondary | ICD-10-CM

## 2012-12-28 DIAGNOSIS — E559 Vitamin D deficiency, unspecified: Secondary | ICD-10-CM

## 2012-12-28 LAB — POCT URINALYSIS DIPSTICK
Leukocytes, UA: NEGATIVE
Spec Grav, UA: 1.02
Urobilinogen, UA: NEGATIVE
pH, UA: 5.5

## 2012-12-28 LAB — HEMOGLOBIN, FINGERSTICK: Hemoglobin, fingerstick: 13.4 g/dL (ref 12.0–16.0)

## 2012-12-28 LAB — TSH: TSH: 2.193 u[IU]/mL (ref 0.350–4.500)

## 2012-12-28 MED ORDER — LEVOTHYROXINE SODIUM 50 MCG PO TABS: ORAL_TABLET | ORAL | Status: DC

## 2012-12-28 MED ORDER — ESTRADIOL 2 MG VA RING: 2.0000 mg | VAGINAL_RING | VAGINAL | Status: DC

## 2012-12-28 NOTE — Progress Notes (Signed)
62 y.o. G0P0 Married Caucasian Fe here for annual exam.   New health problems. She continues on Estriig for Atrophic vaginitis.  No LMP recorded. Patient is postmenopausal.          Sexually active: yes  The current method of family planning is post menopausal status.    Exercising: yes  yoga 2x weekly and treadmill 3-4x weekly Smoker:  no  Health Maintenance: Pap:  11/14/2011  Normal with negative HR HPV MMG:  11/2012 normal Colonoscopy:  11/2012 2 polyps and diverticula recheck in 5 years BMD:   01/2010 T Score: spine -1.5/ hip -0.4 TDaP:  10/09/2010 Labs: Hgb- 13.4    reports that she has never smoked. She has never used smokeless tobacco. She reports that  drinks alcohol. She reports that she does not use illicit drugs.  Past Medical History  Diagnosis Date  . Hypertension   . Vitamin D deficiency   . Diverticulitis 2010  . Adenocarcinoma of breast 06/03    RIGHT  . Amenorrhea 09/01  . Migraine 09/01  . Infertility, female 09/01    4 SAB  . STD (sexually transmitted disease)     Hx of HSV  . Amenorrhea   . Metrorrhagia   . Chemotherapy adverse reaction     Past Surgical History  Procedure Laterality Date  . Appendectomy    . Colonoscopy      Current Outpatient Prescriptions  Medication Sig Dispense Refill  . aspirin 325 MG EC tablet Take 325 mg by mouth daily.      Marland Kitchen ESTRING 2 MG vaginal ring       . levothyroxine (SYNTHROID, LEVOTHROID) 50 MCG tablet TAKE 1 TABLET BY MOUTH EVERY DAY  30 tablet  0  . metoprolol succinate (TOPROL-XL) 50 MG 24 hr tablet Take 50 mg by mouth. Take 1 1/2 tabs 75 mg daily.       No current facility-administered medications for this visit.    Family History  Problem Relation Age of Onset  . Breast cancer Mother   . Colon cancer Mother   . Cervical cancer Mother   . Osteoporosis Mother   . Heart disease Father   . Multiple births Maternal Grandmother   . Multiple births Paternal Grandmother     ROS:  Pertinent items are noted in  HPI.  Otherwise, a comprehensive ROS was negative.  Exam:   BP 124/68  Pulse 74  Resp 12  Ht 5\' 5"  (1.651 m)  Wt 165 lb 6.4 oz (75.025 kg)  BMI 27.52 kg/m2 Height: 5\' 5"  (165.1 cm)  Ht Readings from Last 3 Encounters:  12/28/12 5\' 5"  (1.651 m)  01/17/12 5\' 6"  (1.676 m)    General appearance: alert, cooperative and appears stated age Head: Normocephalic, without obvious abnormality, atraumatic Neck: no adenopathy, supple, symmetrical, trachea midline and thyroid normal to inspection and palpation Lungs: clear to auscultation bilaterally Breasts: normal appearance, no masses or tenderness Heart: regular rate and rhythm Abdomen: soft, non-tender; no masses,  no organomegaly Extremities: extremities normal, atraumatic, no cyanosis or edema Skin: Skin color, texture, turgor normal. No rashes or lesions Lymph nodes: Cervical, supraclavicular, and axillary nodes normal. No abnormal inguinal nodes palpated Neurologic: Grossly normal   Pelvic: External genitalia:  no lesions              Urethra:  normal appearing urethra with no masses, tenderness or lesions              Bartholin's and Skene's: normal  Vagina: normal appearing vagina with normal color and discharge, no lesions              Cervix: anteverted              Pap taken: no Bimanual Exam:  Uterus:  normal size, contour, position, consistency, mobility, non-tender              Adnexa: no mass, fullness, tenderness               Rectovaginal: Confirms               Anus:  normal sphincter tone, no lesions  A:  Well Woman with normal exam  Postmenopausal  S/P Right Breast cancer 11/2001 with lumpectomy and treated with chemo and radiation, ER/PR negative  P:   Pap smear as per guidelines   Mammogram due 11/2013  Refill on Estring for 1 year  Patient is again counseled about risks of ERT with history of breast cancer and also DVT, CVA, cancer, etc.  counseled on adequate intake of calcium and vitamin D,  diet and exercise return annually or prn  An After Visit Summary was printed and given to the patient.

## 2012-12-28 NOTE — Patient Instructions (Signed)

## 2012-12-29 LAB — VITAMIN D 25 HYDROXY (VIT D DEFICIENCY, FRACTURES): Vit D, 25-Hydroxy: 47 ng/mL (ref 30–89)

## 2012-12-31 ENCOUNTER — Encounter: Payer: Self-pay | Admitting: Nurse Practitioner

## 2012-12-31 ENCOUNTER — Telehealth: Payer: Self-pay | Admitting: *Deleted

## 2012-12-31 NOTE — Telephone Encounter (Signed)
Message copied by Osie Bond on Thu Dec 31, 2012 10:41 AM ------      Message from: Roanna Banning      Created: Tue Dec 29, 2012  5:44 PM       Let patient know her results. Vit D is good on OTC med's. Continue same dosing of Thyroid med's. ------

## 2012-12-31 NOTE — Telephone Encounter (Signed)
LVM for pt to return my call in regards to lab results.  

## 2013-01-01 ENCOUNTER — Telehealth: Payer: Self-pay | Admitting: *Deleted

## 2013-01-01 NOTE — Telephone Encounter (Signed)
Message copied by Osie Bond on Fri Jan 01, 2013 10:37 AM ------      Message from: Roanna Banning      Created: Tue Dec 29, 2012  5:44 PM       Let patient know her results. Vit D is good on OTC med's. Continue same dosing of Thyroid med's. ------

## 2013-01-01 NOTE — Telephone Encounter (Signed)
Pt is aware of all lab results and will continue Vitamin D (otc) 600-800 IU daily.

## 2013-01-03 NOTE — Progress Notes (Signed)
Encounter reviewed by Dr. Rashied Corallo Silva.  

## 2013-01-15 ENCOUNTER — Ambulatory Visit (HOSPITAL_BASED_OUTPATIENT_CLINIC_OR_DEPARTMENT_OTHER): Payer: BC Managed Care – PPO | Admitting: Hematology & Oncology

## 2013-01-15 ENCOUNTER — Other Ambulatory Visit (HOSPITAL_BASED_OUTPATIENT_CLINIC_OR_DEPARTMENT_OTHER): Payer: BC Managed Care – PPO | Admitting: Lab

## 2013-01-15 ENCOUNTER — Telehealth: Payer: Self-pay | Admitting: Hematology & Oncology

## 2013-01-15 DIAGNOSIS — Z171 Estrogen receptor negative status [ER-]: Secondary | ICD-10-CM

## 2013-01-15 DIAGNOSIS — C50911 Malignant neoplasm of unspecified site of right female breast: Secondary | ICD-10-CM

## 2013-01-15 DIAGNOSIS — Z853 Personal history of malignant neoplasm of breast: Secondary | ICD-10-CM

## 2013-01-15 LAB — CBC WITH DIFFERENTIAL (CANCER CENTER ONLY)
BASO#: 0 10*3/uL (ref 0.0–0.2)
BASO%: 0.7 % (ref 0.0–2.0)
EOS%: 1.7 % (ref 0.0–7.0)
Eosinophils Absolute: 0.1 10*3/uL (ref 0.0–0.5)
HCT: 38.5 % (ref 34.8–46.6)
HGB: 13.6 g/dL (ref 11.6–15.9)
LYMPH#: 2.5 10*3/uL (ref 0.9–3.3)
LYMPH%: 40.7 % (ref 14.0–48.0)
MCH: 35.7 pg — ABNORMAL HIGH (ref 26.0–34.0)
MCHC: 35.3 g/dL (ref 32.0–36.0)
MCV: 101 fL (ref 81–101)
MONO#: 0.6 10*3/uL (ref 0.1–0.9)
MONO%: 10 % (ref 0.0–13.0)
NEUT#: 2.8 10*3/uL (ref 1.5–6.5)
NEUT%: 46.9 % (ref 39.6–80.0)
Platelets: 218 10*3/uL (ref 145–400)
RBC: 3.81 10*6/uL (ref 3.70–5.32)
RDW: 12.2 % (ref 11.1–15.7)
WBC: 6 10*3/uL (ref 3.9–10.0)

## 2013-01-15 NOTE — Progress Notes (Signed)
This office note has been dictated.

## 2013-01-15 NOTE — Telephone Encounter (Signed)
Mailed 01-14-14 schedule

## 2013-01-16 LAB — COMPREHENSIVE METABOLIC PANEL
ALT: 20 U/L (ref 0–35)
AST: 20 U/L (ref 0–37)
Albumin: 4.3 g/dL (ref 3.5–5.2)
Alkaline Phosphatase: 45 U/L (ref 39–117)
BUN: 11 mg/dL (ref 6–23)
CO2: 30 mEq/L (ref 19–32)
Calcium: 9.5 mg/dL (ref 8.4–10.5)
Chloride: 103 mEq/L (ref 96–112)
Creatinine, Ser: 0.91 mg/dL (ref 0.50–1.10)
Glucose, Bld: 109 mg/dL — ABNORMAL HIGH (ref 70–99)
Potassium: 4.6 mEq/L (ref 3.5–5.3)
Sodium: 138 mEq/L (ref 135–145)
Total Bilirubin: 0.7 mg/dL (ref 0.3–1.2)
Total Protein: 7.2 g/dL (ref 6.0–8.3)

## 2013-01-16 LAB — VITAMIN D 25 HYDROXY (VIT D DEFICIENCY, FRACTURES): Vit D, 25-Hydroxy: 51 ng/mL (ref 30–89)

## 2013-01-16 NOTE — Progress Notes (Signed)
CC:   Rhonda Paul, M.D.  DIAGNOSIS:  Stage IIA (T2 N0 M0) ductal carcinoma of the right breast.  CURRENT THERAPY:  Observation.  INTERIM HISTORY:  Rhonda Paul comes in for her followup.  She is doing nicely.  She has had no problems since we last saw her.  She has had a recent exam by Dr. Edward Jolly.  Everything turned out okay on the exam.  She is exercising.  She is doing yoga.  She really enjoys doing yoga. She has been traveling.  She did go to [place] with her 2 grand kids.  She has had no problems with fever, sweats, or chills.  There has been no change in bowel or bladder habits.  She has had no rashes.  Her last, I think, mammogram was done in May.  The mammogram looked fine with no suspicious calcifications.  PHYSICAL EXAMINATION:  General:  This is a well-developed, well- nourished white female in no obvious distress.  Vital Signs:  Show a temperature of 98, pulse 66, respiratory rate 14, blood pressure 116/64. Weight is 165.  Head and Neck:  Show a normocephalic, atraumatic skull. There are no ocular or oral lesions.  Lymph:  There are no palpable, cervical, or supraclavicular lymph nodes.  Lungs:  Clear bilaterally. Cardiac:  Regular rate and rhythm with a normal S1, S2.  There are no murmurs, rubs, or bruits.  Abdomen:  Soft, and she has good bowel sounds.  There is no palpable abdominal mass.  There is no fluid wave. There is no palpable hepatosplenomegaly.  Extremities:  Show no clubbing, cyanosis, or edema.  Breasts:  Show the left breast with no masses, edema, or erythema.  There is no left axillary adenopathy. Right breast is somewhat contracted from radiation surgery.  She has a well-healed lumpectomy scar at about the 12 o'clock position.  There is some firmness at the lumpectomy site but no distinct mass.  There is no right axillary adenopathy.  Skin:  Shows no rashes, ecchymosis, or petechia.  Back:  Shows no kyphosis.  LABORATORY STUDIES:  White cell count is  6.  Hemoglobin 13.6, hematocrit 38.5.  Platelet count is 218.  IMPRESSION:  Rhonda Paul is a very charming 62 year old white female who is postmenopausal.  She presented with node-negative, stage IIA ductal carcinoma.  Her tumor was ER negative.  She completed 6 cycles of CEF. She had radiation therapy following this.  I really have to believe that she is cured.  Typically, with ER-negative tumors recurrences are typically within 10 years.  We will still plan to get her back yearly.  I probably will see her for a total of 15 years before we can let her go.    ______________________________ Josph Macho, M.D. PRE/MEDQ  D:  01/15/2013  T:  01/16/2013  Job:  [id number]

## 2013-11-08 ENCOUNTER — Other Ambulatory Visit: Payer: Self-pay

## 2013-11-08 DIAGNOSIS — Z1231 Encounter for screening mammogram for malignant neoplasm of breast: Secondary | ICD-10-CM

## 2013-11-17 ENCOUNTER — Ambulatory Visit
Admission: RE | Admit: 2013-11-17 | Discharge: 2013-11-17 | Disposition: A | Discharge status: Home / Self Care | Payer: BC Managed Care – PPO | Source: Ambulatory Visit

## 2013-11-17 ENCOUNTER — Encounter (INDEPENDENT_AMBULATORY_CARE_PROVIDER_SITE_OTHER): Payer: Self-pay

## 2013-11-17 DIAGNOSIS — Z1231 Encounter for screening mammogram for malignant neoplasm of breast: Secondary | ICD-10-CM

## 2013-12-15 ENCOUNTER — Other Ambulatory Visit: Payer: Self-pay | Admitting: *Deleted

## 2013-12-15 DIAGNOSIS — E039 Hypothyroidism, unspecified: Secondary | ICD-10-CM

## 2013-12-15 MED ORDER — LEVOTHYROXINE SODIUM 50 MCG PO TABS: ORAL_TABLET | ORAL | Status: DC

## 2013-12-15 NOTE — Telephone Encounter (Addendum)
Fax From: [facility] for Levothyroxine 50 mcg Last Refilled: [facility] 12/28/12 #90/3 refills  Aex Scheduled: 01/03/14 Last TSH level checked at [facility]: 2.193  Okay to refill until [facility]?  (Routed to DL given Ms. Chong Sicilian is out of office today.)

## 2014-01-03 ENCOUNTER — Encounter: Payer: Self-pay | Admitting: Nurse Practitioner

## 2014-01-03 ENCOUNTER — Other Ambulatory Visit: Payer: Self-pay | Admitting: Nurse Practitioner

## 2014-01-03 ENCOUNTER — Ambulatory Visit (INDEPENDENT_AMBULATORY_CARE_PROVIDER_SITE_OTHER): Payer: BC Managed Care – PPO | Admitting: Nurse Practitioner

## 2014-01-03 VITALS — BP 116/66 | HR 60 | Ht 65.25 in | Wt 163.0 lb

## 2014-01-03 DIAGNOSIS — Z01419 Encounter for gynecological examination (general) (routine) without abnormal findings: Secondary | ICD-10-CM

## 2014-01-03 DIAGNOSIS — Z0000 Encounter for general adult medical examination without abnormal findings: Secondary | ICD-10-CM

## 2014-01-03 DIAGNOSIS — M899 Disorder of bone, unspecified: Secondary | ICD-10-CM

## 2014-01-03 DIAGNOSIS — M949 Disorder of cartilage, unspecified: Secondary | ICD-10-CM

## 2014-01-03 DIAGNOSIS — M8580 Other specified disorders of bone density and structure, unspecified site: Secondary | ICD-10-CM

## 2014-01-03 DIAGNOSIS — E039 Hypothyroidism, unspecified: Secondary | ICD-10-CM

## 2014-01-03 DIAGNOSIS — N952 Postmenopausal atrophic vaginitis: Secondary | ICD-10-CM

## 2014-01-03 LAB — POCT URINALYSIS DIPSTICK
Bilirubin, UA: NEGATIVE
Blood, UA: NEGATIVE
Glucose, UA: NEGATIVE
Ketones, UA: NEGATIVE
Leukocytes, UA: NEGATIVE
Nitrite, UA: NEGATIVE
Protein, UA: NEGATIVE
Urobilinogen, UA: NEGATIVE
pH, UA: 5

## 2014-01-03 LAB — LIPID PANEL
Cholesterol: 236 mg/dL — ABNORMAL HIGH (ref 0–200)
HDL: 73 mg/dL (ref >39)
LDL Cholesterol: 123 mg/dL — ABNORMAL HIGH (ref 0–99)
Total CHOL/HDL Ratio: 3.2 Ratio
Triglycerides: 199 mg/dL — ABNORMAL HIGH (ref <150)
VLDL: 40 mg/dL (ref 0–40)

## 2014-01-03 LAB — CBC WITH DIFFERENTIAL/PLATELET
Basophils Absolute: 0.1 10*3/uL (ref 0.0–0.1)
Basophils Relative: 1 % (ref 0–1)
Eosinophils Absolute: 0.2 10*3/uL (ref 0.0–0.7)
Eosinophils Relative: 3 % (ref 0–5)
HCT: 41.8 % (ref 36.0–46.0)
Hemoglobin: 13.9 g/dL (ref 12.0–15.0)
Lymphocytes Relative: 46 % (ref 12–46)
Lymphs Abs: 3.1 10*3/uL (ref 0.7–4.0)
MCH: 33.4 pg (ref 26.0–34.0)
MCHC: 33.3 g/dL (ref 30.0–36.0)
MCV: 100.5 fL — ABNORMAL HIGH (ref 78.0–100.0)
Monocytes Absolute: 0.5 10*3/uL (ref 0.1–1.0)
Monocytes Relative: 8 % (ref 3–12)
Neutro Abs: 2.9 10*3/uL (ref 1.7–7.7)
Neutrophils Relative %: 42 % — ABNORMAL LOW (ref 43–77)
Platelets: 264 10*3/uL (ref 150–400)
RBC: 4.16 MIL/uL (ref 3.87–5.11)
RDW: 13.4 % (ref 11.5–15.5)
WBC: 6.8 10*3/uL (ref 4.0–10.5)

## 2014-01-03 LAB — COMPREHENSIVE METABOLIC PANEL
ALT: 25 U/L (ref 0–35)
AST: 19 U/L (ref 0–37)
Albumin: 3.9 g/dL (ref 3.5–5.2)
Alkaline Phosphatase: 45 U/L (ref 39–117)
BUN: 13 mg/dL (ref 6–23)
CO2: 26 mEq/L (ref 19–32)
Calcium: 9.4 mg/dL (ref 8.4–10.5)
Chloride: 102 mEq/L (ref 96–112)
Creat: 0.92 mg/dL (ref 0.50–1.10)
Glucose, Bld: 109 mg/dL — ABNORMAL HIGH (ref 70–99)
Potassium: 5.4 mEq/L — ABNORMAL HIGH (ref 3.5–5.3)
Sodium: 138 mEq/L (ref 135–145)
Total Bilirubin: 0.4 mg/dL (ref 0.2–1.2)
Total Protein: 7 g/dL (ref 6.0–8.3)

## 2014-01-03 LAB — TSH: TSH: 4.916 u[IU]/mL — ABNORMAL HIGH (ref 0.350–4.500)

## 2014-01-03 LAB — HEMOGLOBIN, FINGERSTICK: Hemoglobin, fingerstick: 13.4 g/dL (ref 12.0–16.0)

## 2014-01-03 MED ORDER — ESTRADIOL 2 MG VA RING: 2.0000 mg | VAGINAL_RING | VAGINAL | Status: DC

## 2014-01-03 MED ORDER — LEVOTHYROXINE SODIUM 50 MCG PO TABS: ORAL_TABLET | ORAL | Status: DC

## 2014-01-03 NOTE — Progress Notes (Signed)
Patient ID: Rhonda Paul, female   DOB: [birth year], 63 y.o.   MRN: [id number] 63 y.o. G1P0 Married Caucasian Fe here for annual exam.  No new health problems.  No problems with Synthroid. Just returned from [place] for step daughter's wedding.   Patient's last menstrual period was 11/08/2001.          Sexually active: yes  The current method of family planning is post menopausal status.  Exercising: yes yoga 2 x weekly and treadmill 3-4 x weekly  Smoker: no   Health Maintenance:  Pap: 11/14/2011 Normal with negative HR HPV  MMG: 11/17/13, Bi-rads 1: negative  Colonoscopy: 11/2012 - 2 polyps and diverticula recheck in 5 years  BMD: 01/2010 T Score: spine -1.5/ hip -0.4  TDaP: 10/09/2010  Labs:  HB:  13.4  Urine: negative Ph: 5.0      reports that she has never smoked. She has never used smokeless tobacco. She reports that she drinks alcohol. She reports that she does not use illicit drugs.  Past Medical History  Diagnosis Date  . Vitamin D deficiency 07/2006  . Diverticulitis 2009    diverticulitis  . Adenocarcinoma of breast 06/03    RIGHT  . Amenorrhea 09/01  . Migraine 09/01  . Infertility, female 09/01    4 SAB  . STD (sexually transmitted disease)     Hx of HSV  . Amenorrhea   . Metrorrhagia   . Chemotherapy adverse reaction   . Hypertension     Past Surgical History  Procedure Laterality Date  . Colonoscopy  11/2012    diverticulitis, 2 polyps negative, repeat in 5 years  . Appendectomy  1959  . Breast surgery Right 11/2001    lumpectomy with sentinel node biopsy X 3 all negative, ER/PR negative , chemo 6 doses, and radiation daily X 6 weeks    Current Outpatient Prescriptions  Medication Sig Dispense Refill  . aspirin 325 MG EC tablet Take 325 mg by mouth daily.      Marland Kitchen estradiol (ESTRING) 2 MG vaginal ring Place 2 mg vaginally every 3 (three) months.  1 each  3  . levothyroxine (SYNTHROID, LEVOTHROID) 50 MCG tablet TAKE 1 TABLET BY MOUTH EVERY DAY  90 tablet  3  .  metoprolol succinate (TOPROL-XL) 50 MG 24 hr tablet Take 75 mg by mouth.        No current facility-administered medications for this visit.    Family History  Problem Relation Age of Onset  . Colon cancer Mother   . Cervical cancer Mother   . Osteoporosis Mother   . Cancer Mother   . Heart disease Father   . Multiple births Maternal Grandmother   . Multiple births Paternal Grandmother   . Heart disease Brother   . Breast cancer Maternal Aunt   . Cancer Maternal Aunt     ROS:  Pertinent items are noted in HPI.  Otherwise, a comprehensive ROS was negative.  Exam:   BP 116/66  Pulse 60  Ht 5' 5.25" (1.657 m)  Wt 163 lb (73.936 kg)  BMI 26.93 kg/m2  LMP 11/08/2001 Height: 5' 5.25" (165.7 cm)  Ht Readings from Last 3 Encounters:  01/03/14 5' 5.25" (1.657 m)  12/28/12 5\' 5"  (1.651 m)  01/17/12 5\' 6"  (1.676 m)    General appearance: alert, cooperative and appears stated age Head: Normocephalic, without obvious abnormality, atraumatic Neck: no adenopathy, supple, symmetrical, trachea midline and thyroid normal to inspection and palpation Lungs: clear to auscultation bilaterally Breasts:  normal appearance, no masses or tenderness, positive findings: on the left.  Surgical and radiation changes on the right. Heart: regular rate and rhythm Abdomen: soft, non-tender; no masses,  no organomegaly Extremities: extremities normal, atraumatic, no cyanosis or edema Skin: Skin color, texture, turgor normal. No rashes or lesions Lymph nodes: Cervical, supraclavicular, and axillary nodes normal. No abnormal inguinal nodes palpated Neurologic: Grossly normal   Pelvic: External genitalia:  no lesions              Urethra:  normal appearing urethra with no masses, tenderness or lesions              Bartholin's and Skene's: normal                 Vagina: slight atrophic appearing vagina with normal color and discharge, no lesions              Cervix: anteverted              Pap taken:  No. Bimanual Exam:  Uterus:  normal size, contour, position, consistency, mobility, non-tender              Adnexa: no mass, fullness, tenderness               Rectovaginal: Confirms               Anus:  normal sphincter tone, no lesions  A:  Well Woman with normal exam  Postmenopausal  Atrophic vaginitis on Estring  P:   Reviewed health and wellness pertinent to exam  Pap smear not taken today  Mammogram is due 6/16  Refill Synthroid 50 mcg daily for a year - pending labs  Order for BMD is placed  Refill on Estring for a year - she will go online and see about a download for a coupon  Counseled about risk of CVA, DVT, cancer, etc  Will follow with fasting labs and take a copy to Dr. Jonette Eva  Counseled on breast self exam, mammography screening. return annually or prn  An After Visit Summary was printed and given to the patient.

## 2014-01-03 NOTE — Patient Instructions (Signed)

## 2014-01-04 LAB — HEMOGLOBIN A1C
Hgb A1c MFr Bld: 5.9 % — ABNORMAL HIGH (ref <5.7)
Mean Plasma Glucose: 123 mg/dL — ABNORMAL HIGH (ref <117)

## 2014-01-04 LAB — VITAMIN D 25 HYDROXY (VIT D DEFICIENCY, FRACTURES): Vit D, 25-Hydroxy: 51 ng/mL (ref 30–89)

## 2014-01-06 NOTE — Progress Notes (Signed)
Encounter reviewed by Dr. Brook Silva.  

## 2014-01-07 ENCOUNTER — Telehealth: Payer: Self-pay | Admitting: Nurse Practitioner

## 2014-01-07 DIAGNOSIS — R7989 Other specified abnormal findings of blood chemistry: Secondary | ICD-10-CM

## 2014-01-07 NOTE — Telephone Encounter (Signed)
Encounter closed in error without routing.   Routing to [employer], FNP for review and advise.

## 2014-01-07 NOTE — Telephone Encounter (Signed)
Spoke with patient. Patient states that she was given refills for synthroid 12mcg but found out her thyroid level was elevated. Wants to make sure that no adjustment needs to be made to medication before she fills and starts taking it. Patient also would like to know if there is something other than the Estring that she can use. Patient states that the Estring will cost $300 and with the savings card is only 100 dollars cheaper. Patient would like to know if there is something else that she can try. Advised would send a message over to [place] Cage, [place]. [place] and give patient a call back with further instructions and recommendations. Patient agreeable.

## 2014-01-07 NOTE — Telephone Encounter (Signed)
We could adjust her Synthroid now but this dose has been very good for her in the past - which is why I suggested a recheck lab in 4 weeks.

## 2014-01-07 NOTE — Telephone Encounter (Signed)
About her Estring - even though quite effective also quite expensive.   She may try Vagifem 10 mcg twice weekly and see if that is less expensive and not as messy as creams.

## 2014-01-07 NOTE — Telephone Encounter (Signed)
Patient has some questions regarding the prescriptions given to her at her appointment this week.

## 2014-01-10 ENCOUNTER — Ambulatory Visit: Payer: BC Managed Care – PPO | Admitting: Hematology & Oncology

## 2014-01-10 ENCOUNTER — Other Ambulatory Visit: Payer: BC Managed Care – PPO | Admitting: Lab

## 2014-01-10 NOTE — Telephone Encounter (Signed)
Spoke with patient. Advised of message as seen below from Milford Cage, [place]. Patient is agreeable and verbalizes understanding. Patient would like to contact her insurance regarding coverage about the Estring and see why it is not covered. Would aldo like to see if anything else will be. Patient will call back if she would like to change prescriptions. Lab appointment made for 4 week follow up TSH level for 8/27 at 8:30am. Patient agreeable to date and time.  Routing to provider for final review. Patient agreeable to disposition. Will close encounter

## 2014-01-10 NOTE — Addendum Note (Signed)
Addended by: Jasmine Awe on: 01/10/2014 09:48 AM   Modules accepted: Orders

## 2014-01-12 ENCOUNTER — Telehealth: Payer: Self-pay | Admitting: Hematology & Oncology

## 2014-01-12 NOTE — Telephone Encounter (Signed)
Pt moved 8-7 to 8-26

## 2014-01-14 ENCOUNTER — Ambulatory Visit: Payer: BC Managed Care – PPO | Admitting: Hematology & Oncology

## 2014-01-14 ENCOUNTER — Other Ambulatory Visit: Payer: BC Managed Care – PPO | Admitting: Lab

## 2014-02-02 ENCOUNTER — Other Ambulatory Visit (HOSPITAL_BASED_OUTPATIENT_CLINIC_OR_DEPARTMENT_OTHER): Payer: BC Managed Care – PPO | Admitting: Lab

## 2014-02-02 ENCOUNTER — Encounter: Payer: Self-pay | Admitting: Hematology & Oncology

## 2014-02-02 ENCOUNTER — Ambulatory Visit (HOSPITAL_BASED_OUTPATIENT_CLINIC_OR_DEPARTMENT_OTHER): Payer: BC Managed Care – PPO | Admitting: Hematology & Oncology

## 2014-02-02 VITALS — BP 120/82 | HR 72 | Temp 98.2°F | Resp 14 | Ht 65.0 in | Wt 164.0 lb

## 2014-02-02 DIAGNOSIS — Z853 Personal history of malignant neoplasm of breast: Secondary | ICD-10-CM

## 2014-02-02 DIAGNOSIS — C50911 Malignant neoplasm of unspecified site of right female breast: Secondary | ICD-10-CM

## 2014-02-02 LAB — CBC WITH DIFFERENTIAL (CANCER CENTER ONLY)
BASO#: 0 10*3/uL (ref 0.0–0.2)
BASO%: 0.6 % (ref 0.0–2.0)
EOS%: 1.3 % (ref 0.0–7.0)
Eosinophils Absolute: 0.1 10*3/uL (ref 0.0–0.5)
HCT: 40.3 % (ref 34.8–46.6)
HGB: 13.6 g/dL (ref 11.6–15.9)
LYMPH#: 2.4 10*3/uL (ref 0.9–3.3)
LYMPH%: 34.9 % (ref 14.0–48.0)
MCH: 34.7 pg — ABNORMAL HIGH (ref 26.0–34.0)
MCHC: 33.7 g/dL (ref 32.0–36.0)
MCV: 103 fL — ABNORMAL HIGH (ref 81–101)
MONO#: 0.7 10*3/uL (ref 0.1–0.9)
MONO%: 10.1 % (ref 0.0–13.0)
NEUT#: 3.6 10*3/uL (ref 1.5–6.5)
NEUT%: 53.1 % (ref 39.6–80.0)
Platelets: 235 10*3/uL (ref 145–400)
RBC: 3.92 10*6/uL (ref 3.70–5.32)
RDW: 12.4 % (ref 11.1–15.7)
WBC: 6.8 10*3/uL (ref 3.9–10.0)

## 2014-02-02 NOTE — Progress Notes (Signed)
Hematology and Oncology Follow Up Visit  Rhonda Paul [id number] 06-09-1951 63 y.o. 02/02/2014   Principle Diagnosis:  Stage IIA (T2 N0 M0) ductal carcinoma of the right breast.  Current Therapy:    Observation     Interim History:  Ms.  Rhonda Paul is back for her yearly followup. She's doing well. Since he last saw her, she really had no problems. She is still working.  She feels good. She's had no problems with nausea vomiting. There's been no bony pain. Patient had her mammogram done in June. Everything was fine.  She recently had a bone density test done. I don't have the results back yet.  There's been no problems with bowels or bladder. She's had no cough. She's had no skin lesions. She's had no leg swelling. There's been no change in her medications.  Medications: Current outpatient prescriptions:aspirin 325 MG EC tablet, Take 325 mg by mouth daily., Disp: , Rfl: ;  estradiol (ESTRING) 2 MG vaginal ring, Place 2 mg vaginally every 3 (three) months., Disp: 1 each, Rfl: 3;  levothyroxine (SYNTHROID, LEVOTHROID) 50 MCG tablet, TAKE 1 TABLET BY MOUTH EVERY DAY, Disp: 90 tablet, Rfl: 3;  metoprolol succinate (TOPROL-XL) 50 MG 24 hr tablet, Take 75 mg by mouth. , Disp: , Rfl:   Allergies: No Known Allergies  Past Medical History, Surgical history, Social history, and Family History were reviewed and updated.  Review of Systems: As above  Physical Exam:  height is 5\' 5"  (1.651 m) and weight is 164 lb (74.39 kg). Her oral temperature is 98.2 F (36.8 C). Her blood pressure is 120/82 and her pulse is 72. Her respiration is 14.    well-developed and well-nourished white female. Head and neck exam shows no ocular or oral lesions. She has no palpable cervical or supraclavicular lymph nodes. Lungs are clear. Cardiac exam regular in rhythm with no murmurs rubs or bruits. Breast exam shows left breast with no masses, edema or erythema. There is no left axillary adenopathy. Right breast  shows a well-healed lumpectomy at the 12:00 position. She has some slight contraction from radiation and surgery of the right breast. No distinct masses noted. There is no right axillary adenopathy. Abdomen is soft. Has good bowel sounds. There is no fluid wave. There is no palpable liver or spleen tip. Extremities shows no clubbing cyanosis or edema. Neurological exam shows no focal neurological deficits.  Lab Results  Component Value Date   WBC 6.8 02/02/2014   HGB 13.6 02/02/2014   HCT 40.3 02/02/2014   MCV 103* 02/02/2014   PLT 235 02/02/2014     Chemistry      Component Value Date/Time   NA 138 01/03/2014 1110   K 5.4* 01/03/2014 1110   CL 102 01/03/2014 1110   CO2 26 01/03/2014 1110   BUN 13 01/03/2014 1110   CREATININE 0.92 01/03/2014 1110   CREATININE 0.91 01/15/2013 0835      Component Value Date/Time   CALCIUM 9.4 01/03/2014 1110   ALKPHOS 45 01/03/2014 1110   AST 19 01/03/2014 1110   ALT 25 01/03/2014 1110   BILITOT 0.4 01/03/2014 1110         Impression and Plan: Ms. Rhonda Paul is a 63 year old female with a history of stage II wave ductal carcinoma right breast. She was diagnosed about 12-13 years ago. Her tumor was ER negative. She received 6 cycles of CEF. She then received radiation.  We will continue to follow her along. I really don't think that she  is going to be recurrent.  We'll see her back in one year.   Rhonda Napoleon, MD 8/26/20155:58 PM

## 2014-02-03 ENCOUNTER — Other Ambulatory Visit (INDEPENDENT_AMBULATORY_CARE_PROVIDER_SITE_OTHER): Payer: BC Managed Care – PPO

## 2014-02-03 DIAGNOSIS — R7989 Other specified abnormal findings of blood chemistry: Secondary | ICD-10-CM

## 2014-02-03 DIAGNOSIS — R946 Abnormal results of thyroid function studies: Secondary | ICD-10-CM

## 2014-02-03 LAB — COMPREHENSIVE METABOLIC PANEL
ALT: 61 U/L — ABNORMAL HIGH (ref 0–35)
AST: 32 U/L (ref 0–37)
Albumin: 4.4 g/dL (ref 3.5–5.2)
Alkaline Phosphatase: 48 U/L (ref 39–117)
BUN: 13 mg/dL (ref 6–23)
CO2: 27 mEq/L (ref 19–32)
Calcium: 9.5 mg/dL (ref 8.4–10.5)
Chloride: 99 mEq/L (ref 96–112)
Creatinine, Ser: 0.87 mg/dL (ref 0.50–1.10)
Glucose, Bld: 100 mg/dL — ABNORMAL HIGH (ref 70–99)
Potassium: 4.4 mEq/L (ref 3.5–5.3)
Sodium: 137 mEq/L (ref 135–145)
Total Bilirubin: 0.9 mg/dL (ref 0.2–1.2)
Total Protein: 7.3 g/dL (ref 6.0–8.3)

## 2014-02-03 LAB — VITAMIN D 25 HYDROXY (VIT D DEFICIENCY, FRACTURES): Vit D, 25-Hydroxy: 41 ng/mL (ref 30–89)

## 2014-02-03 LAB — TSH: TSH: 3.744 u[IU]/mL (ref 0.350–4.500)

## 2014-02-07 ENCOUNTER — Encounter: Payer: Self-pay | Admitting: *Deleted

## 2014-02-11 ENCOUNTER — Telehealth: Payer: Self-pay | Admitting: Nurse Practitioner

## 2014-02-11 NOTE — Telephone Encounter (Signed)
BMD dated 8/13/ from [facility].  BMD results show T Score: spine -0.7; right hip neck -0.3; left hip neck -0.5.  Comparison from 2008 shows a decrease at all sites but this is also a normal postmenopausal bone loss.  We do not want to see further loss.  Her numbers are still in the normal range.  FRAX score: hip 0.3% (risk is > 3% for 10 yrs)  The major osteoporotic fracture is 6.9% (risk is > than 20% in 10 yrs.)  She is to repeat in 3-5 yrs. She must continue calcium, Vit D, exercise including upper body weights and walking on the treadmill, but increase to 45 minutes 4X week.  Avoid falls.

## 2014-02-15 NOTE — Telephone Encounter (Signed)
Patient calling to check on status of results °

## 2014-02-17 NOTE — Telephone Encounter (Signed)
Pt notified of results.  MyChart message sent with BMD values.

## 2014-03-09 ENCOUNTER — Telehealth: Payer: Self-pay | Admitting: *Deleted

## 2014-03-09 MED ORDER — METOPROLOL SUCCINATE ER 50 MG PO TB24: ORAL_TABLET | ORAL | Status: DC

## 2014-03-09 NOTE — Telephone Encounter (Signed)
Patient requests metoprolol refill. Can you check the [facility] system and let me know the dose and strength? Thanks, MI

## 2014-03-09 NOTE — Telephone Encounter (Signed)
Metoprolol refilled for (75mg ) qd 45tab R-1

## 2014-04-01 ENCOUNTER — Encounter: Payer: Self-pay | Admitting: Interventional Cardiology

## 2014-04-01 ENCOUNTER — Ambulatory Visit (INDEPENDENT_AMBULATORY_CARE_PROVIDER_SITE_OTHER): Payer: BC Managed Care – PPO | Admitting: Interventional Cardiology

## 2014-04-01 VITALS — BP 112/76 | HR 62 | Ht 65.0 in | Wt 164.8 lb

## 2014-04-01 DIAGNOSIS — I4821 Permanent atrial fibrillation: Secondary | ICD-10-CM

## 2014-04-01 DIAGNOSIS — Z23 Encounter for immunization: Secondary | ICD-10-CM

## 2014-04-01 DIAGNOSIS — I340 Nonrheumatic mitral (valve) insufficiency: Secondary | ICD-10-CM | POA: Insufficient documentation

## 2014-04-01 DIAGNOSIS — I482 Chronic atrial fibrillation: Secondary | ICD-10-CM

## 2014-04-01 DIAGNOSIS — E785 Hyperlipidemia, unspecified: Secondary | ICD-10-CM

## 2014-04-01 DIAGNOSIS — C50919 Malignant neoplasm of unspecified site of unspecified female breast: Secondary | ICD-10-CM | POA: Insufficient documentation

## 2014-04-01 HISTORY — DX: Permanent atrial fibrillation: I48.21

## 2014-04-01 MED ORDER — METOPROLOL SUCCINATE ER 50 MG PO TB24: ORAL_TABLET | ORAL | Status: DC

## 2014-04-01 NOTE — Patient Instructions (Signed)
Your physician recommends that you continue on your current medications as directed. Please refer to the Current Medication list given to you today.  Your physician has requested that you have an echocardiogram. Echocardiography is a painless test that uses sound waves to create images of your heart. It provides your doctor with information about the size and shape of your heart and how well your heart's chambers and valves are working. This procedure takes approximately one hour. There are no restrictions for this procedure.  Your physician wants you to follow-up in: 1 year with Dr. Smith.  You will receive a reminder letter in the mail two months in advance. If you don't receive a letter, please call our office to schedule the follow-up appointment.  

## 2014-04-01 NOTE — Progress Notes (Signed)
Patient ID: Rhonda Paul, female   DOB: 07/11/1950, 63 y.o.   MRN: [id number]    [address] [place], [place]  [address] Phone: [phone] Fax:  [phone]  Date:  04/01/2014   ID:  Rhonda Paul, DOB [birth year], MRN [id number]  PCP:  No PCP Per Patient   ASSESSMENT:  1. Permanent atrial fibrillation with controlled rate 2. History of breast cancer  PLAN:  1. Chads/vascular score is 1 which with dictate continuation of aspirin daily 2. 2-D Doppler echocardiogram to reassess structurally of her heart and to rule out late chemotherapy side effects 3. Clinical followup in one year   SUBJECTIVE: Rhonda Paul is a 63 y.o. female who is doing well. She is now 13 years status post surgery, radiation, and chemotherapy for breast cancer. She's been found to have atrial fibrillation and is asymptomatic. Echo previously demonstrated mild to moderate mitral regurgitation and 2012. She is totally asymptomatic. She has not had neurological complaints.   Wt Readings from Last 3 Encounters:  04/01/14 164 lb 12.8 oz (74.753 kg)  02/02/14 164 lb (74.39 kg)  01/03/14 163 lb (73.936 kg)     Past Medical History  Diagnosis Date  . Vitamin D deficiency 07/2006  . Diverticulitis 2009    diverticulitis  . Adenocarcinoma of breast 06/03    RIGHT  . Amenorrhea 09/01  . Migraine 09/01  . Infertility, female 09/01    4 SAB  . STD (sexually transmitted disease)     Hx of HSV  . Amenorrhea   . Metrorrhagia   . Chemotherapy adverse reaction   . Hypertension   . Permanent atrial fibrillation 04/01/2014    Current Outpatient Prescriptions  Medication Sig Dispense Refill  . aspirin 325 MG EC tablet Take 325 mg by mouth daily.      Marland Kitchen estradiol (ESTRING) 2 MG vaginal ring Place 2 mg vaginally every 3 (three) months.  1 each  3  . levothyroxine (SYNTHROID, LEVOTHROID) 50 MCG tablet TAKE 1 TABLET BY MOUTH EVERY DAY  90 tablet  3  . metoprolol succinate (TOPROL-XL) 50 MG  24 hr tablet Take 1 and 1/2 tablet (75mg ) Daily  45 tablet  1   No current facility-administered medications for this visit.    Allergies:   No Known Allergies  Social History:  The patient  reports that she has never smoked. She has never used smokeless tobacco. She reports that she drinks alcohol. She reports that she does not use illicit drugs.   ROS:  Please see the history of present illness.   No edema, orthopnea, PND, or other complaints   All other systems reviewed and negative.   OBJECTIVE: VS:  BP 112/76  Pulse 62  Ht 5\' 5"  (1.651 m)  Wt 164 lb 12.8 oz (74.753 kg)  BMI 27.42 kg/m2  LMP 11/08/2001 Well nourished, well developed, in no acute distress, older than stated age 9: normal Neck: JVD flat. Carotid bruit absent  Cardiac:  normal S1, S2; IIRR; no murmur Lungs:  clear to auscultation bilaterally, no wheezing, rhonchi or rales Abd: soft, nontender, no hepatomegaly Ext: Edema absent. Pulses 2+ Skin: warm and dry Neuro:  CNs 2-12 intact, no focal abnormalities noted  EKG:  Atrial fibrillation with controlled rate and leftward axis. Poor R-wave progression.       Signed, Illene Labrador III, MD 04/01/2014 3:00 PM

## 2014-04-05 ENCOUNTER — Ambulatory Visit (HOSPITAL_COMMUNITY): Payer: BC Managed Care – PPO | Attending: Cardiology

## 2014-04-05 DIAGNOSIS — I10 Essential (primary) hypertension: Secondary | ICD-10-CM | POA: Insufficient documentation

## 2014-04-05 DIAGNOSIS — I340 Nonrheumatic mitral (valve) insufficiency: Secondary | ICD-10-CM | POA: Insufficient documentation

## 2014-04-05 NOTE — Progress Notes (Signed)
2D Echo completed. 04/05/2014

## 2014-04-06 ENCOUNTER — Telehealth: Payer: Self-pay | Admitting: Nurse Practitioner

## 2014-04-06 NOTE — Telephone Encounter (Signed)
Spoke with patient. Advised order for Shingles Vaccine signed and ready for pick up. Patient states that she will come by tomorrow morning to pick it up. Order left at front for patient.  Routing to Regina Eck CNM as covering  Routing to provider for final review. Patient agreeable to disposition. Will close encounter

## 2014-04-06 NOTE — Telephone Encounter (Signed)
Patient calling requesting an RX for the shingles vaccine. She wants to come pick it up from our office.

## 2014-04-06 NOTE — Telephone Encounter (Signed)
Order to Regina Eck CNM for review and signature as Milford Cage, FNP is out of the office.

## 2014-04-11 ENCOUNTER — Encounter: Payer: Self-pay | Admitting: Interventional Cardiology

## 2014-04-14 ENCOUNTER — Telehealth: Payer: Self-pay

## 2014-04-14 NOTE — Telephone Encounter (Signed)
-----   Message from Sinclair Grooms, MD sent at 04/13/2014  2:02 PM EST ----- Heart is okay. No change in strategy needed right now.

## 2014-04-14 NOTE — Telephone Encounter (Signed)
Pt aware of echo results.Heart is okay. No change in strategy needed right now.pt verbalized understanding.

## 2014-11-02 ENCOUNTER — Other Ambulatory Visit: Payer: Self-pay

## 2014-11-02 DIAGNOSIS — Z1231 Encounter for screening mammogram for malignant neoplasm of breast: Secondary | ICD-10-CM

## 2014-11-16 ENCOUNTER — Ambulatory Visit: Payer: Self-pay

## 2014-11-22 ENCOUNTER — Ambulatory Visit
Admission: RE | Admit: 2014-11-22 | Discharge: 2014-11-22 | Disposition: A | Discharge status: Home / Self Care | Payer: 59 | Source: Ambulatory Visit

## 2014-11-22 DIAGNOSIS — Z1231 Encounter for screening mammogram for malignant neoplasm of breast: Secondary | ICD-10-CM

## 2015-01-03 ENCOUNTER — Inpatient Hospital Stay (HOSPITAL_COMMUNITY): Payer: 59

## 2015-01-03 ENCOUNTER — Encounter (HOSPITAL_COMMUNITY)
Admission: EM | Disposition: A | Discharge status: Home / Self Care | Payer: Self-pay | Source: Home / Self Care | Attending: Neurology

## 2015-01-03 ENCOUNTER — Emergency Department (HOSPITAL_COMMUNITY): Payer: 59

## 2015-01-03 ENCOUNTER — Encounter (HOSPITAL_COMMUNITY): Payer: Self-pay

## 2015-01-03 ENCOUNTER — Emergency Department (HOSPITAL_COMMUNITY): Payer: 59 | Admitting: Certified Registered Nurse Anesthetist

## 2015-01-03 ENCOUNTER — Inpatient Hospital Stay (HOSPITAL_COMMUNITY)
Admission: EM | Admit: 2015-01-03 | Discharge: 2015-01-06 | DRG: 023 | Disposition: A | Discharge status: Home / Self Care | Payer: 59 | Attending: Neurology | Admitting: Neurology

## 2015-01-03 DIAGNOSIS — Z7982 Long term (current) use of aspirin: Secondary | ICD-10-CM | POA: Diagnosis not present

## 2015-01-03 DIAGNOSIS — G8194 Hemiplegia, unspecified affecting left nondominant side: Secondary | ICD-10-CM | POA: Diagnosis not present

## 2015-01-03 DIAGNOSIS — R4781 Slurred speech: Secondary | ICD-10-CM | POA: Diagnosis present

## 2015-01-03 DIAGNOSIS — I63411 Cerebral infarction due to embolism of right middle cerebral artery: Principal | ICD-10-CM | POA: Diagnosis present

## 2015-01-03 DIAGNOSIS — Z9221 Personal history of antineoplastic chemotherapy: Secondary | ICD-10-CM

## 2015-01-03 DIAGNOSIS — Z79899 Other long term (current) drug therapy: Secondary | ICD-10-CM | POA: Diagnosis not present

## 2015-01-03 DIAGNOSIS — J9601 Acute respiratory failure with hypoxia: Secondary | ICD-10-CM | POA: Diagnosis not present

## 2015-01-03 DIAGNOSIS — Z4659 Encounter for fitting and adjustment of other gastrointestinal appliance and device: Secondary | ICD-10-CM

## 2015-01-03 DIAGNOSIS — H5340 Unspecified visual field defects: Secondary | ICD-10-CM | POA: Diagnosis present

## 2015-01-03 DIAGNOSIS — Z853 Personal history of malignant neoplasm of breast: Secondary | ICD-10-CM

## 2015-01-03 DIAGNOSIS — I639 Cerebral infarction, unspecified: Secondary | ICD-10-CM | POA: Diagnosis not present

## 2015-01-03 DIAGNOSIS — G43909 Migraine, unspecified, not intractable, without status migrainosus: Secondary | ICD-10-CM | POA: Diagnosis present

## 2015-01-03 DIAGNOSIS — Z8249 Family history of ischemic heart disease and other diseases of the circulatory system: Secondary | ICD-10-CM

## 2015-01-03 DIAGNOSIS — I10 Essential (primary) hypertension: Secondary | ICD-10-CM | POA: Diagnosis present

## 2015-01-03 DIAGNOSIS — I482 Chronic atrial fibrillation: Secondary | ICD-10-CM | POA: Diagnosis present

## 2015-01-03 DIAGNOSIS — D7589 Other specified diseases of blood and blood-forming organs: Secondary | ICD-10-CM | POA: Diagnosis present

## 2015-01-03 DIAGNOSIS — E785 Hyperlipidemia, unspecified: Secondary | ICD-10-CM | POA: Diagnosis present

## 2015-01-03 DIAGNOSIS — E039 Hypothyroidism, unspecified: Secondary | ICD-10-CM | POA: Diagnosis present

## 2015-01-03 DIAGNOSIS — Z9011 Acquired absence of right breast and nipple: Secondary | ICD-10-CM

## 2015-01-03 DIAGNOSIS — I63031 Cerebral infarction due to thrombosis of right carotid artery: Secondary | ICD-10-CM | POA: Diagnosis not present

## 2015-01-03 DIAGNOSIS — R29810 Facial weakness: Secondary | ICD-10-CM | POA: Diagnosis present

## 2015-01-03 DIAGNOSIS — J9811 Atelectasis: Secondary | ICD-10-CM | POA: Insufficient documentation

## 2015-01-03 DIAGNOSIS — R739 Hyperglycemia, unspecified: Secondary | ICD-10-CM | POA: Diagnosis present

## 2015-01-03 DIAGNOSIS — Z923 Personal history of irradiation: Secondary | ICD-10-CM | POA: Diagnosis not present

## 2015-01-03 DIAGNOSIS — I6789 Other cerebrovascular disease: Secondary | ICD-10-CM | POA: Diagnosis not present

## 2015-01-03 HISTORY — DX: Cerebral infarction, unspecified: I63.9

## 2015-01-03 HISTORY — PX: RADIOLOGY WITH ANESTHESIA: SHX6223

## 2015-01-03 LAB — COMPREHENSIVE METABOLIC PANEL
ALT: 93 U/L — ABNORMAL HIGH (ref 14–54)
AST: 51 U/L — ABNORMAL HIGH (ref 15–41)
Albumin: 4 g/dL (ref 3.5–5.0)
Alkaline Phosphatase: 50 U/L (ref 38–126)
Anion gap: 10 (ref 5–15)
BUN: 14 mg/dL (ref 6–20)
CO2: 24 mmol/L (ref 22–32)
Calcium: 9.2 mg/dL (ref 8.9–10.3)
Chloride: 102 mmol/L (ref 101–111)
Creatinine, Ser: 0.88 mg/dL (ref 0.44–1.00)
GFR calc Af Amer: >60 mL/min (ref >60)
GFR calc non Af Amer: >60 mL/min (ref >60)
Glucose, Bld: 126 mg/dL — ABNORMAL HIGH (ref 65–99)
Potassium: 4.5 mmol/L (ref 3.5–5.1)
Sodium: 136 mmol/L (ref 135–145)
Total Bilirubin: 1 mg/dL (ref 0.3–1.2)
Total Protein: 7.7 g/dL (ref 6.5–8.1)

## 2015-01-03 LAB — DIFFERENTIAL
Basophils Absolute: 0 10*3/uL (ref 0.0–0.1)
Basophils Relative: 1 % (ref 0–1)
Eosinophils Absolute: 0.1 10*3/uL (ref 0.0–0.7)
Eosinophils Relative: 2 % (ref 0–5)
Lymphocytes Relative: 35 % (ref 12–46)
Lymphs Abs: 2.4 10*3/uL (ref 0.7–4.0)
Monocytes Absolute: 0.7 10*3/uL (ref 0.1–1.0)
Monocytes Relative: 10 % (ref 3–12)
Neutro Abs: 3.7 10*3/uL (ref 1.7–7.7)
Neutrophils Relative %: 52 % (ref 43–77)

## 2015-01-03 LAB — I-STAT CHEM 8, ED
BUN: 19 mg/dL (ref 6–20)
Calcium, Ion: 1.14 mmol/L (ref 1.13–1.30)
Chloride: 102 mmol/L (ref 101–111)
Creatinine, Ser: 0.8 mg/dL (ref 0.44–1.00)
Glucose, Bld: 127 mg/dL — ABNORMAL HIGH (ref 65–99)
HCT: 46 % (ref 36.0–46.0)
Hemoglobin: 15.6 g/dL — ABNORMAL HIGH (ref 12.0–15.0)
Potassium: 4.5 mmol/L (ref 3.5–5.1)
Sodium: 140 mmol/L (ref 135–145)
TCO2: 25 mmol/L (ref 0–100)

## 2015-01-03 LAB — GLUCOSE, CAPILLARY
Glucose-Capillary: 91 mg/dL (ref 65–99)
Glucose-Capillary: 75 mg/dL (ref 65–99)

## 2015-01-03 LAB — RAPID URINE DRUG SCREEN, HOSP PERFORMED
Amphetamines: NOT DETECTED
Barbiturates: NOT DETECTED
Benzodiazepines: NOT DETECTED
Cocaine: NOT DETECTED
Opiates: NOT DETECTED
Tetrahydrocannabinol: NOT DETECTED

## 2015-01-03 LAB — CBC
HCT: 40.5 % (ref 36.0–46.0)
Hemoglobin: 13.6 g/dL (ref 12.0–15.0)
MCH: 34.7 pg — ABNORMAL HIGH (ref 26.0–34.0)
MCHC: 33.6 g/dL (ref 30.0–36.0)
MCV: 103.3 fL — ABNORMAL HIGH (ref 78.0–100.0)
Platelets: 210 10*3/uL (ref 150–400)
RBC: 3.92 MIL/uL (ref 3.87–5.11)
RDW: 13.1 % (ref 11.5–15.5)
WBC: 7 10*3/uL (ref 4.0–10.5)

## 2015-01-03 LAB — BLOOD GAS, ARTERIAL
Acid-base deficit: 2.5 mmol/L — ABNORMAL HIGH (ref 0.0–2.0)
Bicarbonate: 22.1 mEq/L (ref 20.0–24.0)
Drawn by: 225631
FIO2: 0.4
MECHVT: 500 mL
O2 Saturation: 98.6 %
PEEP: 5 cmH2O
Patient temperature: 98.6
RATE: 17 resp/min
TCO2: 23.4 mmol/L (ref 0–100)
pCO2 arterial: 40.2 mmHg (ref 35.0–45.0)
pH, Arterial: 7.36 (ref 7.350–7.450)
pO2, Arterial: 135 mmHg — ABNORMAL HIGH (ref 80.0–100.0)

## 2015-01-03 LAB — URINALYSIS, ROUTINE W REFLEX MICROSCOPIC
Bilirubin Urine: NEGATIVE
Glucose, UA: NEGATIVE mg/dL
Hgb urine dipstick: NEGATIVE
Ketones, ur: NEGATIVE mg/dL
Leukocytes, UA: NEGATIVE
Nitrite: NEGATIVE
Protein, ur: NEGATIVE mg/dL
Specific Gravity, Urine: 1.024 (ref 1.005–1.030)
Urobilinogen, UA: 0.2 mg/dL (ref 0.0–1.0)
pH: 6.5 (ref 5.0–8.0)

## 2015-01-03 LAB — ETHANOL: Alcohol, Ethyl (B): <5 mg/dL (ref <5)

## 2015-01-03 LAB — CBG MONITORING, ED: Glucose-Capillary: 111 mg/dL — ABNORMAL HIGH (ref 65–99)

## 2015-01-03 LAB — PROTIME-INR
INR: 1.02 (ref 0.00–1.49)
Prothrombin Time: 13.6 seconds (ref 11.6–15.2)

## 2015-01-03 LAB — MRSA PCR SCREENING: MRSA by PCR: NEGATIVE

## 2015-01-03 LAB — APTT: aPTT: 27 seconds (ref 24–37)

## 2015-01-03 LAB — I-STAT TROPONIN, ED: Troponin i, poc: 0 ng/mL (ref 0.00–0.08)

## 2015-01-03 LAB — TRIGLYCERIDES: Triglycerides: 151 mg/dL — ABNORMAL HIGH (ref <150)

## 2015-01-03 SURGERY — RADIOLOGY WITH ANESTHESIA
Anesthesia: General

## 2015-01-03 MED ORDER — NICARDIPINE HCL IN NACL 20-0.86 MG/200ML-% IV SOLN
5.0000 mg/h | INTRAVENOUS | Status: DC
  Administered 2015-01-03: 5 mg/h via INTRAVENOUS

## 2015-01-03 MED ORDER — NICARDIPINE HCL IN NACL 20-0.86 MG/200ML-% IV SOLN
INTRAVENOUS | Status: AC
  Filled 2015-01-03: qty 200

## 2015-01-03 MED ORDER — STROKE: EARLY STAGES OF RECOVERY BOOK
Freq: Once | Status: DC
  Filled 2015-01-03: qty 1

## 2015-01-03 MED ORDER — ACETAMINOPHEN 650 MG RE SUPP: 650.0000 mg | RECTAL | Status: DC | PRN

## 2015-01-03 MED ORDER — IOHEXOL 300 MG/ML  SOLN
150.0000 mL | Freq: Once | INTRAMUSCULAR | Status: AC | PRN
  Administered 2015-01-03: 70 mL via INTRAVENOUS

## 2015-01-03 MED ORDER — LIDOCAINE HCL 1 % IJ SOLN
INTRAMUSCULAR | Status: AC
  Filled 2015-01-03: qty 20

## 2015-01-03 MED ORDER — FENTANYL CITRATE (PF) 250 MCG/5ML IJ SOLN
INTRAMUSCULAR | Status: AC
  Filled 2015-01-03: qty 5

## 2015-01-03 MED ORDER — FENTANYL CITRATE (PF) 100 MCG/2ML IJ SOLN
100.0000 ug | INTRAMUSCULAR | Status: DC | PRN
  Administered 2015-01-03: 100 ug via INTRAVENOUS

## 2015-01-03 MED ORDER — SODIUM CHLORIDE 0.9 % IV SOLN: INTRAVENOUS | Status: DC

## 2015-01-03 MED ORDER — PROPOFOL INFUSION 10 MG/ML OPTIME
INTRAVENOUS | Status: DC | PRN
  Administered 2015-01-03: 50 ug/kg/min via INTRAVENOUS

## 2015-01-03 MED ORDER — SUCCINYLCHOLINE CHLORIDE 20 MG/ML IJ SOLN
INTRAMUSCULAR | Status: DC | PRN
  Administered 2015-01-03: 140 mg via INTRAVENOUS

## 2015-01-03 MED ORDER — NITROGLYCERIN 1 MG/10 ML FOR IR/CATH LAB
INTRA_ARTERIAL | Status: AC
  Administered 2015-01-03 (×2): 25 ug
  Filled 2015-01-03: qty 10

## 2015-01-03 MED ORDER — ROCURONIUM BROMIDE 100 MG/10ML IV SOLN
INTRAVENOUS | Status: DC | PRN
  Administered 2015-01-03: 50 mg via INTRAVENOUS

## 2015-01-03 MED ORDER — FAMOTIDINE IN NACL 20-0.9 MG/50ML-% IV SOLN
20.0000 mg | Freq: Two times a day (BID) | INTRAVENOUS | Status: DC
  Administered 2015-01-03 – 2015-01-05 (×5): 20 mg via INTRAVENOUS
  Filled 2015-01-03 (×6): qty 50

## 2015-01-03 MED ORDER — ACETAMINOPHEN 500 MG PO TABS: 1000.0000 mg | ORAL_TABLET | Freq: Four times a day (QID) | ORAL | Status: DC | PRN

## 2015-01-03 MED ORDER — ACETAMINOPHEN 325 MG PO TABS
650.0000 mg | ORAL_TABLET | ORAL | Status: DC | PRN
  Administered 2015-01-05 – 2015-01-06 (×6): 650 mg via ORAL
  Filled 2015-01-03 (×6): qty 2

## 2015-01-03 MED ORDER — SODIUM CHLORIDE 0.9 % IV SOLN
INTRAVENOUS | Status: DC | PRN
  Administered 2015-01-03: 15:00:00 via INTRAVENOUS

## 2015-01-03 MED ORDER — ALTEPLASE (STROKE) FULL DOSE INFUSION: 0.9000 mg/kg | Freq: Once | INTRAVENOUS | Status: DC

## 2015-01-03 MED ORDER — LABETALOL HCL 5 MG/ML IV SOLN: 10.0000 mg | INTRAVENOUS | Status: DC | PRN

## 2015-01-03 MED ORDER — CEFAZOLIN SODIUM-DEXTROSE 2-3 GM-% IV SOLR
2.0000 g | Freq: Once | INTRAVENOUS | Status: AC
  Administered 2015-01-03: 2 g via INTRAVENOUS

## 2015-01-03 MED ORDER — ALTEPLASE (STROKE) FULL DOSE INFUSION
69.0000 mg | Freq: Once | INTRAVENOUS | Status: AC
  Administered 2015-01-03: 62 mg via INTRAVENOUS
  Filled 2015-01-03: qty 69

## 2015-01-03 MED ORDER — FENTANYL CITRATE (PF) 100 MCG/2ML IJ SOLN
100.0000 ug | INTRAMUSCULAR | Status: DC | PRN
  Administered 2015-01-04: 100 ug via INTRAVENOUS
  Filled 2015-01-03 (×3): qty 2

## 2015-01-03 MED ORDER — PHENYLEPHRINE HCL 10 MG/ML IJ SOLN
10.0000 mg | INTRAVENOUS | Status: DC | PRN
  Administered 2015-01-03: 20 ug/min via INTRAVENOUS

## 2015-01-03 MED ORDER — PANTOPRAZOLE SODIUM 40 MG IV SOLR
40.0000 mg | Freq: Every day | INTRAVENOUS | Status: DC
  Administered 2015-01-03 – 2015-01-05 (×3): 40 mg via INTRAVENOUS
  Filled 2015-01-03 (×4): qty 40

## 2015-01-03 MED ORDER — PHENYLEPHRINE HCL 10 MG/ML IJ SOLN
0.0000 ug/min | INTRAMUSCULAR | Status: DC
  Filled 2015-01-03: qty 1

## 2015-01-03 MED ORDER — ACETAMINOPHEN 650 MG RE SUPP: 650.0000 mg | Freq: Four times a day (QID) | RECTAL | Status: DC | PRN

## 2015-01-03 MED ORDER — ALTEPLASE 30 MG/30 ML FOR INTERV. RAD
1.0000 mg | INTRA_ARTERIAL | Status: AC | PRN
  Administered 2015-01-03: 5 mg via INTRA_ARTERIAL
  Administered 2015-01-03: 2 mg via INTRA_ARTERIAL
  Filled 2015-01-03: qty 30

## 2015-01-03 MED ORDER — ONDANSETRON HCL 4 MG/2ML IJ SOLN: 4.0000 mg | Freq: Four times a day (QID) | INTRAMUSCULAR | Status: DC | PRN

## 2015-01-03 MED ORDER — INSULIN ASPART 100 UNIT/ML ~~LOC~~ SOLN: 2.0000 [IU] | SUBCUTANEOUS | Status: DC

## 2015-01-03 MED ORDER — PROPOFOL 1000 MG/100ML IV EMUL
0.0000 ug/kg/min | INTRAVENOUS | Status: DC
  Administered 2015-01-03 – 2015-01-04 (×2): 50 ug/kg/min via INTRAVENOUS
  Administered 2015-01-04: 40 ug/kg/min via INTRAVENOUS
  Administered 2015-01-04: 50 ug/kg/min via INTRAVENOUS
  Administered 2015-01-04: 40 ug/kg/min via INTRAVENOUS
  Filled 2015-01-03 (×6): qty 100

## 2015-01-03 MED ORDER — SODIUM CHLORIDE 0.9 % IV SOLN: 250.0000 mL | INTRAVENOUS | Status: DC | PRN

## 2015-01-03 MED ORDER — SENNOSIDES-DOCUSATE SODIUM 8.6-50 MG PO TABS
1.0000 | ORAL_TABLET | Freq: Every evening | ORAL | Status: DC | PRN
  Filled 2015-01-03: qty 1

## 2015-01-03 MED ORDER — FENTANYL CITRATE (PF) 100 MCG/2ML IJ SOLN
INTRAMUSCULAR | Status: DC | PRN
  Administered 2015-01-03: 100 ug via INTRAVENOUS
  Administered 2015-01-03: 150 ug via INTRAVENOUS

## 2015-01-03 MED ORDER — CETYLPYRIDINIUM CHLORIDE 0.05 % MT LIQD
7.0000 mL | Freq: Four times a day (QID) | OROMUCOSAL | Status: DC
  Administered 2015-01-03 – 2015-01-04 (×3): 7 mL via OROMUCOSAL

## 2015-01-03 MED ORDER — PROPOFOL 10 MG/ML IV BOLUS
INTRAVENOUS | Status: DC | PRN
  Administered 2015-01-03: 150 mg via INTRAVENOUS
  Administered 2015-01-03: 50 mg via INTRAVENOUS

## 2015-01-03 MED ORDER — SODIUM CHLORIDE 0.9 % IV SOLN
INTRAVENOUS | Status: DC
  Administered 2015-01-03 – 2015-01-04 (×3): via INTRAVENOUS

## 2015-01-03 MED ORDER — CEFAZOLIN SODIUM-DEXTROSE 2-3 GM-% IV SOLR
INTRAVENOUS | Status: AC
  Filled 2015-01-03: qty 50

## 2015-01-03 MED ORDER — CHLORHEXIDINE GLUCONATE 0.12 % MT SOLN
15.0000 mL | Freq: Two times a day (BID) | OROMUCOSAL | Status: DC
  Administered 2015-01-03 – 2015-01-04 (×2): 15 mL via OROMUCOSAL
  Filled 2015-01-03 (×2): qty 15

## 2015-01-03 NOTE — Transfer of Care (Signed)
Immediate Anesthesia Transfer of Care Note  Patient: Rhonda Paul  Procedure(s) Performed: Procedure(s): RADIOLOGY WITH ANESTHESIA (N/A)  Patient Location: PACU and NICU  Anesthesia Type:General  Level of Consciousness: Patient remains intubated per anesthesia plan  Airway & Oxygen Therapy: Patient remains intubated per anesthesia plan and Patient placed on Ventilator (see vital sign flow sheet for setting)  Post-op Assessment: Report given to RN and Post -op Vital signs reviewed and stable  Post vital signs: Reviewed and stable  Last Vitals:  Filed Vitals:   01/03/15 1446  BP: 134/75  Pulse:   Resp:     Complications: No apparent anesthesia complications

## 2015-01-03 NOTE — Code Documentation (Signed)
64yo female arriving to [facility] via [place] at 35.  EMS reports that the patient had been at work and went to lunch at her baseline.  After returning to work she went into her coworker's office at 3 and was noted to have garbled speech.  EMS was called and assessed patient to have garbled speech, left hemiplegia and right gaze and activated a Code Stroke.  Patient with h/o atrial fibrillation and noted to be in atrial fibrillation on arrival.  Stroke team at the bedside on arrival.  Labs drawn and patient cleared by Dr. Johnney Killian.  Patient to CT. NIHSS 10, see documentation for details and code stroke times.  Patient reports 3/10 headache.  Patient with improvement in symptoms en route and noted to have left arm and leg drift on arrival, however, patient noted to worsen after CT.  CT showing increased attenuation in the right middle cerebral artery concerning for earliest changes of infarct/thrombosis in this region.  Pharmacy called to mix tPA at 1423.  Foley catheter placed.  NS IVF started.  IR notified of patient.  tPA delivered at 1432.  BP in tPA parameters.  Proceed with tPA per Dr. Armida Sans.  tPA 7mg  bolus given at 1433 over one minute followed by 62mg /hr for a total of 69mg  per pharmacy dosing.  IR RN to the bedside.  Bedside handoff given to Amy, RN and patient transported to IR with IR RN and RRT RN.  Patient's husband to the bedside and updated on plan of care by Dr. Armida Sans.  Dr. Estanislado Pandy explained the endovascular procedure to patient's husband who consented.  Husband shown to radiology waiting area by Stroke RN.

## 2015-01-03 NOTE — ED Notes (Signed)
Pt in via [facility] from work, LSN 13:20 & symptom onset 13:20, hx of a fib without tx, pt reported to have R sided gaze, & L arm & leg flaccid, symptoms improving upon arrival to ED

## 2015-01-03 NOTE — ED Provider Notes (Signed)
CSN: [id number]     Arrival date & time 01/03/15  1409 History   First MD Initiated Contact with Patient 01/03/15 1413     No chief complaint on file.   An emergency department physician performed an initial assessment on this suspected stroke patient at 85. (Consider location/radiation/quality/duration/timing/severity/associated sxs/prior Treatment) HPI Comments: 64 year old female with history of atrial fibrillation on aspirin, lipids, nonsmoker presents with left-sided paralysis and mild right-sided gaze that started 1:20 PM today. No history of stroke, no fevers chills or other symptoms. Symptoms have had mild improvement since EMS arrival. Code stroke was called the field.  The history is provided by the patient.    Past Medical History  Diagnosis Date  . Vitamin D deficiency 07/2006  . Diverticulitis 2009    diverticulitis  . Adenocarcinoma of breast 06/03    RIGHT  . Amenorrhea 09/01  . Migraine 09/01  . Infertility, female 09/01    4 SAB  . STD (sexually transmitted disease)     Hx of HSV  . Amenorrhea   . Metrorrhagia   . Chemotherapy adverse reaction   . Hypertension   . Permanent atrial fibrillation 04/01/2014   Past Surgical History  Procedure Laterality Date  . Colonoscopy  11/2012    diverticulitis, 2 polyps negative, repeat in 5 years  . Appendectomy  1959  . Breast surgery Right 11/2001    lumpectomy with sentinel node biopsy X 3 all negative, ER/PR negative , chemo 6 doses, and radiation daily X 6 weeks   Family History  Problem Relation Age of Onset  . Colon cancer Mother   . Cervical cancer Mother   . Osteoporosis Mother   . Cancer Mother   . Heart disease Father   . Multiple births Maternal Grandmother   . Multiple births Paternal Grandmother   . Heart disease Brother   . Breast cancer Maternal Aunt   . Cancer Maternal Aunt    History  Substance Use Topics  . Smoking status: Never Smoker   . Smokeless tobacco: Never Used     Comment: never  used tobacco  . Alcohol Use: 0.0 oz/week    7-14 Glasses of wine per week   OB History    Gravida Para Term Preterm AB TAB SAB Ectopic Multiple Living   1              Review of Systems  Constitutional: Negative for fever and chills.  HENT: Negative for congestion.   Eyes: Negative for visual disturbance.  Respiratory: Negative for shortness of breath.   Cardiovascular: Negative for chest pain.  Gastrointestinal: Negative for vomiting and abdominal pain.  Genitourinary: Negative for dysuria and flank pain.  Musculoskeletal: Negative for back pain, neck pain and neck stiffness.  Skin: Negative for rash.  Neurological: Positive for weakness and numbness. Negative for light-headedness and headaches.      Allergies  Review of patient's allergies indicates no known allergies.  Home Medications   Prior to Admission medications   Medication Sig Start Date End Date Taking? Authorizing Provider  aspirin 325 MG EC tablet Take 325 mg by mouth daily.    Historical Provider, MD  estradiol (ESTRING) 2 MG vaginal ring Place 2 mg vaginally every 3 (three) months. 01/03/14   Kem Boroughs, FNP  levothyroxine (SYNTHROID, LEVOTHROID) 50 MCG tablet TAKE 1 TABLET BY MOUTH EVERY DAY 01/03/14   Kem Boroughs, FNP  metoprolol succinate (TOPROL-XL) 50 MG 24 hr tablet Take 1 and 1/2 tablet (75mg ) Daily 04/01/14  Belva Crome, MD   BP 120/54 mmHg  Pulse 73  SpO2 98%  LMP 11/08/2001 Physical Exam  Constitutional: She is oriented to person, place, and time. She appears well-developed and well-nourished.  HENT:  Head: Normocephalic and atraumatic.  Eyes: Conjunctivae are normal. Right eye exhibits no discharge. Left eye exhibits no discharge.  Neck: Normal range of motion. Neck supple. No tracheal deviation present.  Cardiovascular: Normal rate and regular rhythm.   Pulmonary/Chest: Effort normal and breath sounds normal.  Abdominal: Soft. She exhibits no distension. There is no tenderness. There  is no guarding.  Musculoskeletal: She exhibits no edema.  Neurological: She is alert and oriented to person, place, and time. A cranial nerve deficit is present. GCS eye subscore is 4. GCS verbal subscore is 5. GCS motor subscore is 6.  Patient has left arm and left leg weakness, 5+ strength right arm and right leg.   Skin: Skin is warm. No rash noted.  Psychiatric: She has a normal mood and affect.  Nursing note and vitals reviewed.   ED Course  Procedures (including critical care time) CRITICAL CARE Performed by: Mariea Clonts   Total critical care time: 30 min  Critical care time was exclusive of separately billable procedures and treating other patients.  Critical care was necessary to treat or prevent imminent or life-threatening deterioration.  Critical care was time spent personally by me on the following activities: development of treatment plan with patient and/or surrogate as well as nursing, discussions with consultants, evaluation of patient's response to treatment, examination of patient, obtaining history from patient or surrogate, ordering and performing treatments and interventions, ordering and review of laboratory studies, ordering and review of radiographic studies, pulse oximetry and re-evaluation of patient's condition.   Labs Review Labs Reviewed  CBC - Abnormal; Notable for the following:    MCV 103.3 (*)    MCH 34.7 (*)    All other components within normal limits  COMPREHENSIVE METABOLIC PANEL - Abnormal; Notable for the following:    Glucose, Bld 126 (*)    AST 51 (*)    ALT 93 (*)    All other components within normal limits  I-STAT CHEM 8, ED - Abnormal; Notable for the following:    Glucose, Bld 127 (*)    Hemoglobin 15.6 (*)    All other components within normal limits  CBG MONITORING, ED - Abnormal; Notable for the following:    Glucose-Capillary 111 (*)    All other components within normal limits  ETHANOL  PROTIME-INR  APTT   DIFFERENTIAL  URINE RAPID DRUG SCREEN, HOSP PERFORMED  URINALYSIS, ROUTINE W REFLEX MICROSCOPIC (NOT AT [facility])  I-STAT TROPOININ, ED    Imaging Review Ct Head Wo Contrast  01/03/2015   CLINICAL DATA:  Left-sided weakness with slurred speech  EXAM: CT HEAD WITHOUT CONTRAST  TECHNIQUE: Contiguous axial images were obtained from the base of the skull through the vertex without intravenous contrast.  COMPARISON:  April 18, 2009  FINDINGS: The ventricles are normal in size and configuration. There is no intracranial mass, hemorrhage, extra-axial fluid collection or midline shift. There is diffuse increased attenuation in the right middle cerebral artery, concerning for thrombosis in this vessel/early right middle cerebral artery dystrophy new shin infarct. There is subtle loss of detail of the right insula, concerning for early edema in this area. Elsewhere gray-white compartments are normal. Bony calvarium appears intact. The mastoid air cells are clear.  IMPRESSION: Increased attenuation in the right middle cerebral  artery concerning for earliest changes of infarct/thrombosis in this region. Subtle loss of right insular cortex detail, and other finding suggesting early right middle cerebral artery distribution infarct. Elsewhere gray-white compartments appear within normal limits. No hemorrhage or mass effect.  Critical Value/emergent results were called by telephone at the time of interpretation on 01/03/2015 at 2:31 pm to Dr. Aram Beecham, neurology, who verbally acknowledged these results.   Electronically Signed   By: Lowella Grip III M.D.   On: 01/03/2015 14:32     EKG Interpretation None     EKG reviewed heart rate 68, atrial fibrillation/flutter, nonspecific ST changes, normal QT MDM   Final diagnoses:  Stroke   Patient presents with code stroke, see stroke team note for further details. Patient protecting airway in the ER, CT scan reviewed by myself and radiology. Radiology concern for  early middle cerebral infarct. Neurologist discussed risk and benefits of TPA, TPA infusing in the ER. With acute presentation with an time window and NIH score of 10 plan for interventional.  The patients results and plan were reviewed and discussed.   Any x-rays performed were independently reviewed by myself.   Differential diagnosis were considered with the presenting HPI.  Medications  alteplase (ACTIVASE) 1 mg/mL infusion 69 mg (62 mg Intravenous New Bag/Given 01/03/15 1433)    Filed Vitals:   01/03/15 1430 01/03/15 1446  BP: 120/54 134/75  Pulse: 73   SpO2: 98%     Final diagnoses:  Stroke    Admission/ observation were discussed with the admitting physician, patient and/or family and they are comfortable with the plan.      Elnora Morrison, MD 01/03/15 [phone]

## 2015-01-03 NOTE — Procedures (Signed)
S/P rt common carotiod arteriogram,followed by complete revascularization of RT MCA  prox M1 occlusion withx1 pass with Solitaire 25mm x 40 mm Fr retrieval device and 7 mg of superselective IA tpa achieving a TICI 3 revascularization

## 2015-01-03 NOTE — Progress Notes (Signed)
eLink Physician-Brief Progress Note Patient Name: Rhonda Paul DOB: 1951-03-02 MRN: [id number]   Date of Service  01/03/2015  HPI/Events of Note  Bedside nurse request R leg and bilateral wrist restraints.  eICU Interventions  Will order requested restraints.      Intervention Category Minor Interventions: Routine modifications to care plan (e.g. PRN medications for pain, fever)  Mael Delap Eugene 01/03/2015, 7:31 PM

## 2015-01-03 NOTE — Progress Notes (Signed)
Pt transported to 3M08 from CT. Pt tolerated well. Placed on current settings, bedside RT aware.  RT will continue to monitor

## 2015-01-03 NOTE — Anesthesia Preprocedure Evaluation (Signed)
Anesthesia Evaluation  Patient identified by MRN, date of birth, ID band Patient awake    Reviewed: Allergy & Precautions, NPO status , Patient's Chart, lab work & pertinent test results  Airway Mallampati: III  TM Distance: >3 FB Neck ROM: Full    Dental   Pulmonary    Pulmonary exam normal       Cardiovascular hypertension, Pt. on medications Normal cardiovascular exam    Neuro/Psych CVA    GI/Hepatic   Endo/Other    Renal/GU      Musculoskeletal   Abdominal   Peds  Hematology   Anesthesia Other Findings   Reproductive/Obstetrics                             Anesthesia Physical Anesthesia Plan  ASA: IV and emergent  Anesthesia Plan: General   Post-op Pain Management:    Induction: Intravenous  Airway Management Planned: Oral ETT and Video Laryngoscope Planned  Additional Equipment:   Intra-op Plan:   Post-operative Plan: Post-operative intubation/ventilation  Informed Consent: I have reviewed the patients History and Physical, chart, labs and discussed the procedure including the risks, benefits and alternatives for the proposed anesthesia with the patient or authorized representative who has indicated his/her understanding and acceptance.     Plan Discussed with: CRNA and Surgeon  Anesthesia Plan Comments:         Anesthesia Quick Evaluation

## 2015-01-03 NOTE — H&P (Signed)
H&P    Chief Complaint: Code stroke  HPI:                                                                                                                                         Rhonda Paul is an 64 y.o. female with history of HTN, Afib taking ASA, breast cancer s/p right mastectomy+XRT+chemotherapy, and migraine.  Today she had gone out to lunch and when she returned to work it was noted she was slurring her words. EMS was called and on arrival she had a right gaze deviation and left flaccid paralysis.  On arrival to ED she no longer had a right gaze preference but continued to show a left hemiplegia and left field cut.CT head was personally reviewed and showed subtle loss of right insular cortex (ASPECTS 9) as well as hyperdense right MCA sign. IV tPA was given at 1434 and IR was consulted for possible intervention .  Complains of HA that has been present since earlier today but denies vertigo, double vision, difficulty swallowing, or visual disturbances. Date last known well: Date: 01/03/2015 Time last known well: Time: 13:20 tPA Given: Yes Modified Rankin: Rankin Score=0  NIHSS 10 ASPECTS: 9    Past Medical History  Diagnosis Date  . Vitamin D deficiency 07/2006  . Diverticulitis 2009    diverticulitis  . Adenocarcinoma of breast 06/03    RIGHT  . Amenorrhea 09/01  . Migraine 09/01  . Infertility, female 09/01    4 SAB  . STD (sexually transmitted disease)     Hx of HSV  . Amenorrhea   . Metrorrhagia   . Chemotherapy adverse reaction   . Hypertension   . Permanent atrial fibrillation 04/01/2014    Past Surgical History  Procedure Laterality Date  . Colonoscopy  11/2012    diverticulitis, 2 polyps negative, repeat in 5 years  . Appendectomy  1959  . Breast surgery Right 11/2001    lumpectomy with sentinel node biopsy X 3 all negative, ER/PR negative , chemo 6 doses, and radiation daily X 6 weeks    Family History  Problem Relation Age of Onset  . Colon  cancer Mother   . Cervical cancer Mother   . Osteoporosis Mother   . Cancer Mother   . Heart disease Father   . Multiple births Maternal Grandmother   . Multiple births Paternal Grandmother   . Heart disease Brother   . Breast cancer Maternal Aunt   . Cancer Maternal Aunt    Social History:  reports that she has never smoked. She has never used smokeless tobacco. She reports that she drinks alcohol. She reports that she does not use illicit drugs.  Allergies: No Known Allergies  Medications:  Current Facility-Administered Medications  Medication Dose Route Frequency Provider Last Rate Last Dose  . alteplase (ACTIVASE) 1 mg/mL infusion 0.9 mg/kg  0.9 mg/kg Intravenous Once Elnora Morrison, MD      . alteplase (ACTIVASE) 1 mg/mL infusion 69 mg  69 mg Intravenous Once Elnora Morrison, MD       Current Outpatient Prescriptions  Medication Sig Dispense Refill  . aspirin 325 MG EC tablet Take 325 mg by mouth daily.    Marland Kitchen estradiol (ESTRING) 2 MG vaginal ring Place 2 mg vaginally every 3 (three) months. 1 each 3  . levothyroxine (SYNTHROID, LEVOTHROID) 50 MCG tablet TAKE 1 TABLET BY MOUTH EVERY DAY 90 tablet 3  . metoprolol succinate (TOPROL-XL) 50 MG 24 hr tablet Take 1 and 1/2 tablet (75mg ) Daily 45 tablet 11     ROS:                                                                                                                                       History obtained from the patient  General ROS: negative for - chills, fatigue, fever, night sweats, weight gain or weight loss Psychological ROS: negative for - behavioral disorder, hallucinations, memory difficulties, mood swings or suicidal ideation Ophthalmic ROS: negative for - blurry vision, double vision, eye pain or loss of vision ENT ROS: negative for - epistaxis, nasal discharge, oral lesions, sore throat, tinnitus  or vertigo Allergy and Immunology ROS: negative for - hives or itchy/watery eyes Hematological and Lymphatic ROS: negative for - bleeding problems, bruising or swollen lymph nodes Endocrine ROS: negative for - galactorrhea, hair pattern changes, polydipsia/polyuria or temperature intolerance Respiratory ROS: negative for - cough, hemoptysis, shortness of breath or wheezing Cardiovascular ROS: negative for - chest pain, dyspnea on exertion, edema or irregular heartbeat Gastrointestinal ROS: negative for - abdominal pain, diarrhea, hematemesis, nausea/vomiting or stool incontinence Genito-Urinary ROS: negative for - dysuria, hematuria, incontinence or urinary frequency/urgency Musculoskeletal ROS: negative for - joint swelling or muscular weakness Neurological ROS: as noted in HPI Dermatological ROS: negative for rash and skin lesion changes  Physical Examination:                                                                                                      Last menstrual period 11/08/2001.  HEENT-  Normocephalic, no lesions, without obvious abnormality.  Normal external eye and conjunctiva.  Normal TM's bilaterally.  Normal auditory canals and external ears. Normal external nose, mucus membranes and septum.  Normal pharynx.  Cardiovascular- S1, S2 normal, pulses palpable throughout   Lungs- chest clear, no wheezing, rales, normal symmetric air entry Abdomen- normal findings: bowel sounds normal Extremities- no edema Lymph-no adenopathy palpable Musculoskeletal-no joint tenderness, deformity or swelling Skin-warm and dry, no hyperpigmentation, vitiligo, or suspicious lesions  Neurological Examination Mental Status: Alert, oriented, thought content appropriate.  Speech dysarthric without evidence of aphasia.  Able to follow 3 step commands without difficulty. Cranial Nerves: II: Discs flat bilaterally; Visual fields shows a left hemianopsia, pupils equal, round, reactive to light and  accommodation III,IV, VI: ptosis not present, extra-ocular motions intact bilaterally V,VII: smile asymmetric on the left, facial light touch sensation neglects the left VIII: hearing normal bilaterally IX,X: uvula rises symmetrically XI: bilateral shoulder shrug XII: midline tongue extension Motor: Right : Upper extremity   5/5    Left:     Upper extremity   3/5  Lower extremity   5/5     Lower extremity   4/5 Tone and bulk:normal tone throughout; no atrophy noted Sensory: Pinprick and light touch intact throughout but neglects the left leg with DSS Deep Tendon Reflexes: 2+ and symmetric throughout UE and 1+ LE Plantars: Right: downgoing   Left: up going Cerebellar: normal finger-to-nose on the right and normal heel-to-shin test on the right Gait: not tested       Lab Results: Basic Metabolic Panel:  Recent Labs Lab 01/03/15 1419  NA 140  K 4.5  CL 102  GLUCOSE 127*  BUN 19  CREATININE 0.80    Liver Function Tests: No results for input(s): AST, ALT, ALKPHOS, BILITOT, PROT, ALBUMIN in the last 168 hours. No results for input(s): LIPASE, AMYLASE in the last 168 hours. No results for input(s): AMMONIA in the last 168 hours.  CBC:  Recent Labs Lab 01/03/15 1419 01/03/15 1420  WBC  --  7.0  NEUTROABS  --  3.7  HGB 15.6* 13.6  HCT 46.0 40.5  MCV  --  103.3*  PLT  --  210    Cardiac Enzymes: No results for input(s): CKTOTAL, CKMB, CKMBINDEX, TROPONINI in the last 168 hours.  Lipid Panel: No results for input(s): CHOL, TRIG, HDL, CHOLHDL, VLDL, LDLCALC in the last 168 hours.  CBG: No results for input(s): GLUCAP in the last 168 hours.  Microbiology: No results found for this or any previous visit.  Coagulation Studies: No results for input(s): LABPROT, INR in the last 72 hours.  Imaging: No results found.   Assessment: 64 y.o. female with history of afib now presenting to hospital with acute onset left sided deficits and CT brain with subtle loss of  right insular cortex (ASPECTS 9) as well as hyperdense right MCA sign.  Patient was given IV tPA and taken to the IR suite for further intervention.  Admit to NICU. Complete stroke work up and follow post IV tpa and endovascular treatment protocol. Stroke team will follow up tomorrow.   Stroke Risk Factors - atrial fibrillation and hypertension  Dorian Pod, MD [facility] [phone]  01/03/2015, 2:31 PM

## 2015-01-03 NOTE — Consult Note (Signed)
PULMONARY / CRITICAL CARE MEDICINE   Name: Rhonda Paul MRN: [id number] DOB: [birth year]    ADMISSION DATE:  01/03/2015 CONSULTATION DATE:  01/03/15  REFERRING MD :  Dr. Leonie Man   CHIEF COMPLAINT:  AMS  INITIAL PRESENTATION: 64 y/o F   STUDIES:  7/26  CT Head >> increased attenuation in the R MCA concerning for infarct/thrombosis in this region, subtle loss of R insular cortex detail suggestive of early R MCA CVA  SIGNIFICANT EVENTS: 7/26  Admit with L sided weakness. Work up concerning for R MCA infarct.  To neuro IR.     HISTORY OF PRESENT ILLNESS:  64 y/o F, non-smoker,  with PMH of adenocarcinoma of the breast (2003) s/p chemotherapy, migraines, HTN, permanent atrial fibrillation (not on anticoagulation, ASA only + lopressor) who presented to [facility] on 7/26 with acute onset left sided weakness.  The patient was last seen normal 1320.  Reported to have right sided gaze and left sided weakness upon arrival to ER.  CT of the head was evaluated which showed increased attenuation in the R MCA concerning for infarct/thrombosis in this region, subtle loss of R insular cortex detail suggestive of early R MCA CVA.  UDS was negative.  Initial lab work:  Troponin negative, Na 136, K 4.5, Cl 102, sr cr 0.88, AST 51, ALT 93, glucose 126, WBC 7.0, hgb 13.6, MCV 103.3, and platelets 210.  The patient was taken emergently to neuro-interventional radiology for potential revascularization.  PCCM consulted for post-operative ventilator management.    PAST MEDICAL HISTORY :   has a past medical history of Vitamin D deficiency (07/2006); Diverticulitis (2009); Adenocarcinoma of breast (06/03); Amenorrhea (09/01); Migraine (09/01); Infertility, female (09/01); STD (sexually transmitted disease); Amenorrhea; Metrorrhagia; Chemotherapy adverse reaction; Hypertension; and Permanent atrial fibrillation (04/01/2014).  has past surgical history that includes Colonoscopy (11/2012); Appendectomy (1959); and Breast  surgery (Right, 11/2001).    Prior to Admission medications   Medication Sig Start Date End Date Taking? Authorizing Provider  aspirin 325 MG EC tablet Take 325 mg by mouth daily.    Historical Provider, MD  estradiol (ESTRING) 2 MG vaginal ring Place 2 mg vaginally every 3 (three) months. 01/03/14   Kem Boroughs, FNP  levothyroxine (SYNTHROID, LEVOTHROID) 50 MCG tablet TAKE 1 TABLET BY MOUTH EVERY DAY 01/03/14   Kem Boroughs, FNP  metoprolol succinate (TOPROL-XL) 50 MG 24 hr tablet Take 1 and 1/2 tablet (75mg ) Daily 04/01/14   Belva Crome, MD   No Known Allergies  FAMILY HISTORY:  indicated that her mother is alive. She indicated that her father is deceased. She indicated that all of her three sisters are alive. She indicated that both of her brothers are alive. She indicated that her maternal aunt is alive.    SOCIAL HISTORY:  reports that she has never smoked. She has never used smokeless tobacco. She reports that she drinks alcohol. She reports that she does not use illicit drugs.  REVIEW OF SYSTEMS:  Unable to complete as pt is on mechanical ventilation.   SUBJECTIVE:   VITAL SIGNS: Pulse Rate:  [60-73] 71 (07/26 1440) Resp:  [13-15] 13 (07/26 1440) BP: (120-134)/(54-81) 134/75 mmHg (07/26 1446) SpO2:  [88 %-98 %] 96 % (07/26 1440)   HEMODYNAMICS:     VENTILATOR SETTINGS:     INTAKE / OUTPUT:  Intake/Output Summary (Last 24 hours) at 01/03/15 1641 Last data filed at 01/03/15 1634  Gross per 24 hour  Intake    700 ml  Output  200 ml  Net    500 ml    PHYSICAL EXAMINATION: General:  Overweight female on vent in NAD Neuro:  Sedated on vent, moving LUE with good strength now HEENT:  [place]/AT, no JVD noted Cardiovascular:  IRIR, No MRG Lungs:  Coarse L > R Abdomen:  Soft, non-tender, non-distended Musculoskeletal:  No acute deformity or edema Skin:  Grossly intact  LABS:  CBC  Recent Labs Lab 01/03/15 1419 01/03/15 1420  WBC  --  7.0  HGB 15.6* 13.6   HCT 46.0 40.5  PLT  --  210   Coag's  Recent Labs Lab 01/03/15 1420  APTT 27  INR 1.02   BMET  Recent Labs Lab 01/03/15 1419 01/03/15 1420  NA 140 136  K 4.5 4.5  CL 102 102  CO2  --  24  BUN 19 14  CREATININE 0.80 0.88  GLUCOSE 127* 126*   Electrolytes  Recent Labs Lab 01/03/15 1420  CALCIUM 9.2   Sepsis Markers No results for input(s): LATICACIDVEN, PROCALCITON, O2SATVEN in the last 168 hours.   ABG No results for input(s): PHART, PCO2ART, PO2ART in the last 168 hours.   Liver Enzymes  Recent Labs Lab 01/03/15 1420  AST 51*  ALT 93*  ALKPHOS 50  BILITOT 1.0  ALBUMIN 4.0   Cardiac Enzymes No results for input(s): TROPONINI, PROBNP in the last 168 hours.   Glucose  Recent Labs Lab 01/03/15 1429  GLUCAP 111*    Imaging Ct Head Wo Contrast  01/03/2015   CLINICAL DATA:  Left-sided weakness with slurred speech  EXAM: CT HEAD WITHOUT CONTRAST  TECHNIQUE: Contiguous axial images were obtained from the base of the skull through the vertex without intravenous contrast.  COMPARISON:  April 18, 2009  FINDINGS: The ventricles are normal in size and configuration. There is no intracranial mass, hemorrhage, extra-axial fluid collection or midline shift. There is diffuse increased attenuation in the right middle cerebral artery, concerning for thrombosis in this vessel/early right middle cerebral artery dystrophy new shin infarct. There is subtle loss of detail of the right insula, concerning for early edema in this area. Elsewhere gray-white compartments are normal. Bony calvarium appears intact. The mastoid air cells are clear.  IMPRESSION: Increased attenuation in the right middle cerebral artery concerning for earliest changes of infarct/thrombosis in this region. Subtle loss of right insular cortex detail, and other finding suggesting early right middle cerebral artery distribution infarct. Elsewhere gray-white compartments appear within normal limits. No  hemorrhage or mass effect.  Critical Value/emergent results were called by telephone at the time of interpretation on 01/03/2015 at 2:31 pm to Dr. Aram Beecham, neurology, who verbally acknowledged these results.   Electronically Signed   By: Lowella Grip III M.D.   On: 01/03/2015 14:32     ASSESSMENT / PLAN:  NEUROLOGIC A:   R MCA CVA - s/p tpa, neuro-intervention   P:   RASS goal: -1 Propofol for sedation Neuro checks per protocol Stroke team following  PULMONARY OETT 7/26 >>  A: Acute Respiratory Failure - in setting of neurologic dysfunction / CVA   P:   MV support, 8 cc/kg Daily SBT / WUA  CXR for ETT placement ABG now  CARDIOVASCULAR A:  HTN  Permanent Atrial Fibrillation - [place] Vasc score 1 as of last cardiology OV, aspirin only.   P:  Blood pressure parameters per Neuro  ICU monitoring of hemodynamics  Assess ECHO, carotid dopplers  Phenylephrine PRN Nicardipine PRN  RENAL A:   No  acute issues   P:   Trend BMP / UOP   GASTROINTESTINAL A:   Vent associated Dysphagia   P:   NPO  OGT  Pepcid for SUP Consider TF in am 7/28  Will need SLP evaluation post extubation   HEMATOLOGIC A:   Macrocytosis  P:  Trend CBC  DVT ppx:  Per stroke team  INFECTIOUS A:   No acute infectious process   P:   Monitor fever curve / leukocytosis   ENDOCRINE A:   Hyperglycemia     P:   CBG monitoring and SSI    FAMILY  - Updates:   - Inter-disciplinary family meet or Palliative Care meeting due by:  01/10/2015   Georgann Housekeeper, AGACNP-BC [place] [facility] Pager [phone] or [phone]  01/03/2015 5:00 PM

## 2015-01-03 NOTE — Anesthesia Procedure Notes (Signed)
Procedure Name: Intubation Date/Time: 01/03/2015 3:12 PM Performed by: [place] Pink Pre-anesthesia Checklist: Patient identified, Emergency Drugs available, Suction available, Patient being monitored and Timeout performed Patient Re-evaluated:Patient Re-evaluated prior to inductionOxygen Delivery Method: Circle system utilized Preoxygenation: Pre-oxygenation with 100% oxygen Intubation Type: IV induction and Rapid sequence Laryngoscope Size: Mac and 3 Grade View: Grade III Tube type: Oral Tube size: 7.0 mm Number of attempts: 3 Airway Equipment and Method: Stylet and Video-laryngoscopy Placement Confirmation: ETT inserted through vocal cords under direct vision,  positive ETCO2 and breath sounds checked- equal and bilateral Tube secured with: Tape Dental Injury: Bloody posterior oropharynx  Difficulty Due To: Difficult Airway- due to limited oral opening Future Recommendations: Recommend- induction with short-acting agent, and alternative techniques readily available Comments: Cricoid pressure held throughout attempted intubations. DL x 1 by Shelton Silvas, CRNA view of epiglottis only. DL x 2 Lillia Abed, MD esophageal intubation, ETT removed and attempted mask ventilation with oral airway, two handed mask ventilation required. VSS DLx 3 by Lillia Abed, MD with Meredith Staggers oral ETT placed through vocal cords, positive EtCO2, bilateral breath sounds present and equal. Cricoid pressure released. OGT placed, stomach decompressed. Unable to pass subglottic suction tube, smaller 7.0 regular Oral ETT placed in lieu of subglottic suction ETT.

## 2015-01-04 ENCOUNTER — Inpatient Hospital Stay (HOSPITAL_COMMUNITY): Payer: 59

## 2015-01-04 ENCOUNTER — Encounter (HOSPITAL_COMMUNITY): Payer: Self-pay | Admitting: Radiology

## 2015-01-04 ENCOUNTER — Ambulatory Visit (HOSPITAL_COMMUNITY): Payer: 59

## 2015-01-04 DIAGNOSIS — I639 Cerebral infarction, unspecified: Secondary | ICD-10-CM | POA: Insufficient documentation

## 2015-01-04 DIAGNOSIS — J9601 Acute respiratory failure with hypoxia: Secondary | ICD-10-CM | POA: Insufficient documentation

## 2015-01-04 DIAGNOSIS — J9811 Atelectasis: Secondary | ICD-10-CM | POA: Insufficient documentation

## 2015-01-04 DIAGNOSIS — I63031 Cerebral infarction due to thrombosis of right carotid artery: Secondary | ICD-10-CM

## 2015-01-04 LAB — BASIC METABOLIC PANEL
Anion gap: 6 (ref 5–15)
BUN: 8 mg/dL (ref 6–20)
CO2: 24 mmol/L (ref 22–32)
Calcium: 7.8 mg/dL — ABNORMAL LOW (ref 8.9–10.3)
Chloride: 106 mmol/L (ref 101–111)
Creatinine, Ser: 0.79 mg/dL (ref 0.44–1.00)
GFR calc Af Amer: >60 mL/min (ref >60)
GFR calc non Af Amer: >60 mL/min (ref >60)
Glucose, Bld: 118 mg/dL — ABNORMAL HIGH (ref 65–99)
Potassium: 3.9 mmol/L (ref 3.5–5.1)
Sodium: 136 mmol/L (ref 135–145)

## 2015-01-04 LAB — CBC WITH DIFFERENTIAL/PLATELET
Basophils Absolute: 0 10*3/uL (ref 0.0–0.1)
Basophils Relative: 0 % (ref 0–1)
Eosinophils Absolute: 0.1 10*3/uL (ref 0.0–0.7)
Eosinophils Relative: 1 % (ref 0–5)
HCT: 33 % — ABNORMAL LOW (ref 36.0–46.0)
Hemoglobin: 11.2 g/dL — ABNORMAL LOW (ref 12.0–15.0)
Lymphocytes Relative: 17 % (ref 12–46)
Lymphs Abs: 1.6 10*3/uL (ref 0.7–4.0)
MCH: 34.5 pg — ABNORMAL HIGH (ref 26.0–34.0)
MCHC: 33.9 g/dL (ref 30.0–36.0)
MCV: 101.5 fL — ABNORMAL HIGH (ref 78.0–100.0)
Monocytes Absolute: 0.9 10*3/uL (ref 0.1–1.0)
Monocytes Relative: 9 % (ref 3–12)
Neutro Abs: 6.9 10*3/uL (ref 1.7–7.7)
Neutrophils Relative %: 73 % (ref 43–77)
Platelets: 190 10*3/uL (ref 150–400)
RBC: 3.25 MIL/uL — ABNORMAL LOW (ref 3.87–5.11)
RDW: 13.1 % (ref 11.5–15.5)
WBC: 9.4 10*3/uL (ref 4.0–10.5)

## 2015-01-04 LAB — LIPID PANEL
Cholesterol: 236 mg/dL — ABNORMAL HIGH (ref 0–200)
HDL: 61 mg/dL (ref >40)
LDL Cholesterol: 142 mg/dL — ABNORMAL HIGH (ref 0–99)
Total CHOL/HDL Ratio: 3.9 RATIO
Triglycerides: 166 mg/dL — ABNORMAL HIGH (ref <150)
VLDL: 33 mg/dL (ref 0–40)

## 2015-01-04 LAB — GLUCOSE, CAPILLARY
Glucose-Capillary: 104 mg/dL — ABNORMAL HIGH (ref 65–99)
Glucose-Capillary: 80 mg/dL (ref 65–99)
Glucose-Capillary: 89 mg/dL (ref 65–99)
Glucose-Capillary: 95 mg/dL (ref 65–99)
Glucose-Capillary: 98 mg/dL (ref 65–99)

## 2015-01-04 MED ORDER — INSULIN ASPART 100 UNIT/ML ~~LOC~~ SOLN: 2.0000 [IU] | Freq: Three times a day (TID) | SUBCUTANEOUS | Status: DC

## 2015-01-04 MED ORDER — MENTHOL 3 MG MT LOZG
1.0000 | LOZENGE | OROMUCOSAL | Status: DC | PRN
  Filled 2015-01-04: qty 9

## 2015-01-04 MED ORDER — ASPIRIN 300 MG RE SUPP
300.0000 mg | Freq: Every day | RECTAL | Status: DC
  Administered 2015-01-04: 300 mg via RECTAL
  Filled 2015-01-04 (×2): qty 1

## 2015-01-04 NOTE — Progress Notes (Signed)
STROKE TEAM PROGRESS NOTE   HISTORY Rhonda Paul is an 64 y.o. female with history of HTN, Afib taking ASA, breast cancer s/p right mastectomy+XRT+chemotherapy, and migraine. Today 01/03/2015 she had gone out to lunch and when she returned to work it was noted she was slurring her words (LKW 01/03/2015 at 1320). EMS was called and on arrival she had a right gaze deviation and left flaccid paralysis. On arrival to ED she no longer had a right gaze preference but continued to show a left hemiplegia and left field cut. Complains of HA that has been present since earlier today but denies vertigo, double vision, difficulty swallowing, or visual disturbances. Modified Rankin: Rankin Score=0. NIHSS 10. CT head was reviewed and showed subtle loss of right insular cortex (ASPECTS 9) as well as hyperdense right MCA sign. IV tPA was given at 1434 and IR was consulted. Pt had complete revascularization of RT MCA prox M1 occlusion with x1 pass with Solitaire and 7 mg of IA tpa achieving a TICI 3 revascularization. She was admitted to the neuro ICU for further evaluation and treatment.   SUBJECTIVE (INTERVAL HISTORY) Her RN is at the bedside.  Overall she feels her condition is stable. Patient still has sheath in and is intubated. She has been moving the left side purposefully. Blood pressure has been adequately controlled. No family is available at the bedside   OBJECTIVE Temp:  [96.1 F (35.6 C)-99.3 F (37.4 C)] 98.7 F (37.1 C) (07/27 0800) Pulse Rate:  [25-118] 72 (07/27 0800) Cardiac Rhythm:  [-] Atrial fibrillation (07/27 0800) Resp:  [13-22] 14 (07/27 0830) BP: (91-138)/(52-84) 105/61 mmHg (07/27 0830) SpO2:  [82 %-100 %] 100 % (07/27 0800) Arterial Line BP: (92-153)/(52-78) 100/78 mmHg (07/27 0830) FiO2 (%):  [40 %] 40 % (07/27 0800) Weight:  [79.4 kg (175 lb 0.7 oz)-79.9 kg (176 lb 2.4 oz)] 79.4 kg (175 lb 0.7 oz) (07/27 0458)   Recent Labs Lab 01/03/15 1429 01/03/15 1812 01/03/15 2025  01/04/15 01/04/15 0423  GLUCAP 111* 91 75 80 89    Recent Labs Lab 01/03/15 1419 01/03/15 1420 01/04/15 0500  NA 140 136 136  K 4.5 4.5 3.9  CL 102 102 106  CO2  --  24 24  GLUCOSE 127* 126* 118*  BUN 19 14 8   CREATININE 0.80 0.88 0.79  CALCIUM  --  9.2 7.8*    Recent Labs Lab 01/03/15 1420  AST 51*  ALT 93*  ALKPHOS 50  BILITOT 1.0  PROT 7.7  ALBUMIN 4.0    Recent Labs Lab 01/03/15 1419 01/03/15 1420 01/04/15 0500  WBC  --  7.0 9.4  NEUTROABS  --  3.7 6.9  HGB 15.6* 13.6 11.2*  HCT 46.0 40.5 33.0*  MCV  --  103.3* 101.5*  PLT  --  210 190   No results for input(s): CKTOTAL, CKMB, CKMBINDEX, TROPONINI in the last 168 hours.  Recent Labs  01/03/15 1420  LABPROT 13.6  INR 1.02    Recent Labs  01/03/15 1433  COLORURINE YELLOW  LABSPEC 1.024  PHURINE 6.5  GLUCOSEU NEGATIVE  HGBUR NEGATIVE  BILIRUBINUR NEGATIVE  KETONESUR NEGATIVE  PROTEINUR NEGATIVE  UROBILINOGEN 0.2  NITRITE NEGATIVE  LEUKOCYTESUR NEGATIVE       Component Value Date/Time   CHOL 236* 01/04/2015 0500   TRIG 166* 01/04/2015 0500   HDL 61 01/04/2015 0500   CHOLHDL 3.9 01/04/2015 0500   VLDL 33 01/04/2015 0500   LDLCALC 142* 01/04/2015 0500   Lab Results  Component Value Date   HGBA1C 5.9* 01/03/2014      Component Value Date/Time   LABOPIA NONE DETECTED 01/03/2015 1433   COCAINSCRNUR NONE DETECTED 01/03/2015 1433   LABBENZ NONE DETECTED 01/03/2015 1433   AMPHETMU NONE DETECTED 01/03/2015 1433   THCU NONE DETECTED 01/03/2015 1433   LABBARB NONE DETECTED 01/03/2015 1433     Recent Labs Lab 01/03/15 1420  ETH <5    Ct Head Wo Contrast  01/03/2015   CLINICAL DATA:  Status post revascularization of the right middle cerebral artery. Code stroke. Acute onset of slurred speech, right gaze deviation and left sided paralysis.  EXAM: CT HEAD WITHOUT CONTRAST  TECHNIQUE: Contiguous axial images were obtained from the base of the skull through the vertex without intravenous  contrast.  COMPARISON:  CT head without contrast from the same day.  FINDINGS: No significant hemorrhage is present. Basal ganglia are intact. The decreased density the anterior right insular cortex persists. No additional cortical defects are evident. Mild periventricular white matter hypoattenuation is stable.  The ventricles are of normal size. No significant extra-axial fluid collection is present.  Fluid is present in the nasopharynx. The patient is intubated. Mild mucosal thickening is present in the anterior ethmoid air cells and inferior frontal sinuses. The mastoid air cells are clear. The calvarium is intact. The globes and orbits are within normal limits.  IMPRESSION: 1. Stable slight loss of density in the anterior right insular cortex. 2. No evidence for progression of infarct following revascularization of the right middle cerebral artery. 3. No acute hemorrhage.   Electronically Signed   By: San Morelle M.D.   On: 01/03/2015 16:57   Ct Head Wo Contrast  01/03/2015   CLINICAL DATA:  Left-sided weakness with slurred speech  EXAM: CT HEAD WITHOUT CONTRAST  TECHNIQUE: Contiguous axial images were obtained from the base of the skull through the vertex without intravenous contrast.  COMPARISON:  April 18, 2009  FINDINGS: The ventricles are normal in size and configuration. There is no intracranial mass, hemorrhage, extra-axial fluid collection or midline shift. There is diffuse increased attenuation in the right middle cerebral artery, concerning for thrombosis in this vessel/early right middle cerebral artery dystrophy new shin infarct. There is subtle loss of detail of the right insula, concerning for early edema in this area. Elsewhere gray-white compartments are normal. Bony calvarium appears intact. The mastoid air cells are clear.  IMPRESSION: Increased attenuation in the right middle cerebral artery concerning for earliest changes of infarct/thrombosis in this region. Subtle loss of  right insular cortex detail, and other finding suggesting early right middle cerebral artery distribution infarct. Elsewhere gray-white compartments appear within normal limits. No hemorrhage or mass effect.  Critical Value/emergent results were called by telephone at the time of interpretation on 01/03/2015 at 2:31 pm to Dr. Aram Beecham, neurology, who verbally acknowledged these results.   Electronically Signed   By: Lowella Grip III M.D.   On: 01/03/2015 14:32   Dg Chest Port 1 View  01/03/2015   CLINICAL DATA:  Status post intubation and OG tube placement.  EXAM: PORTABLE CHEST - 1 VIEW  COMPARISON:  None.  FINDINGS: Endotracheal tube is in place with the tip in good position at the level of the clavicular heads well above the carina. OG tube is also in good position with the side-port in the stomach. There is a small left pleural effusion and mild basilar atelectasis. The right lung is clear. No pneumothorax identified. Heart size is normal.  IMPRESSION:  ET tube and NG tube in good position.  Small left pleural effusion and basilar atelectasis.   Electronically Signed   By: Inge Rise M.D.   On: 01/03/2015 17:58   Cerebral angiogram 7/26/216 S/P rt common carotiod arteriogram,followed by complete revascularization of RT MCA prox M1 occlusion withx1 pass with Solitaire 66mm x 40 mm Fr retrieval device and 7 mg of superselective IA tpa achieving a TICI 3 revascularization   PHYSICAL EXAM Pleasant middle-aged Caucasian lady whose intubated and sedated. She has right groin arterial sheath. . Afebrile. Head is nontraumatic. Neck is supple without bruit.    Cardiac exam no murmur or gallop. Lungs are clear to auscultation. Distal pulses are well felt. Neurological Exam : Drowsy but awakens easily and follows commands well. Slight right gaze preference but able to look to the left past midline. Blinks to threat bilaterally. Pupils equal reactive. Fundi were not visualized. Left lower facial weakness.  Tongue midline. Able to move the left side against gravity but suspect mild weakness. Good purposeful antigravity movements on the right side. Sensation and coordination cannot be reliably tested. Left plantar equivocal right downgoing. Gait cannot be tested  ASSESSMENT/PLAN Ms. Rhonda Paul is a 64 y.o. female with history of hypertension, atrial fibrillation on ASA, R mastectomy+XRT+chemo and migraine presenting with slurring her words, right gaze deviation and left flaccid paralysis. She IV alteplase at 1434, s/p complete revascularization of proximal R M1 with Solitaire and IA alteplase.   Stroke:  Non-dominant right brain infarct s/p IV tPA and complete revascularization prox R M1 w/ IA tPA and mechanical thrombectomy. Stroke embolic secondary to known atrial fibrillation   Sheath remains in place post IR  ]Resultant  Left hemiparesis  Cerebral angio prox R M1 occlusion  MRI  pending   2D Echo  pending   LDL 142  HgbA1c pending  SCDs for VTE prophylaxis Diet NPO time specified  aspirin 325 mg orally every day prior to admission, now on no antithrombotic post intervention  D/c sheath today  Ongoing aggressive stroke risk factor management  Therapy recommendations:  pending   Disposition:  pending   Respiratory Failure  Intubated for neurointervention  Requiring sedation this as fighting vent  Review imaging prior to extubating  CCM following  Atrial Fibrillation  Home anticoagulation:  None, documented  ChadsVasc2 of 1 PTA. Given stroke stroke, will add 2. Now, CHA2DS2-VASc Score = 3, ?2 oral anticoagulation recommended  On toprol at home   Hypertension  Home meds:   none  Stable  Hyperglycemia, HgbA1c pending, goal < 7.0  Hyperlipidemia  Home meds:  No statin  LDL 142, goal < 70  Add statin once able to swallow  Other Stroke Risk Factors  ETOH use  Migraines  Other Active Problems  Hypothyroid  Macrocytosis  Currently using  Estradiol vaginal ring   Other Pertinent History  R breast adenocarcinoma s/p chemo 11/2001  Hospital day # [place] for Pager information 01/04/2015 9:50 AM  I have personally examined this patient, reviewed notes, independently viewed imaging studies, participated in medical decision making and plan of care. I have made any additions or clarifications directly to the above note. Agree with note above. She presented with right MCA infarct secondary to embolization from atrial fibrillation-flutter. She was treated with IV TPA and mechanical embolectomy with complete recanalization of the right MCA. She remains at risk for neurological worsening, hemorrhage and needs ongoing close neurological monitoring and tight  blood pressure control. Plan to DC groin sheath first and then extubate later. Check MRI scan later. May need anticoagulation if no hemorrhage noted on MRI. Family not available for discussion at bedside This patient is critically ill and at significant risk of neurological worsening, death and care requires constant monitoring of vital signs, hemodynamics,respiratory and cardiac monitoring, extensive review of multiple databases, frequent neurological assessment, discussion with family, other specialists and medical decision making of high complexity.I have made any additions or clarifications directly to the above note.This critical care time does not reflect procedure time, or teaching time or supervisory time of PA/NP/Med Resident etc but could involve care discussion time.  I spent 40 minutes of neurocritical care time  in the care of  this patient.      Antony Contras, MD Medical Director [place] Pager: [phone] 01/04/2015 12:23 PM    To contact Stroke Continuity provider, please refer to [email or web address] After hours, contact General Neurology

## 2015-01-04 NOTE — Progress Notes (Signed)
Patient ID: Rhonda Paul, female   DOB: Jul 23, 1950, 64 y.o.   MRN: [id number]    Referring Physician(s): Sethi,P  Chief Complaint:  Right MCA CVA  Subjective:  Intubated but F/C; awaiting MRI brain; rt femoral artery sheath removed earlier today  Allergies: Lambs quarters and Other  Medications: Prior to Admission medications   Medication Sig Start Date End Date Taking? Authorizing Provider  aspirin 325 MG EC tablet Take 325 mg by mouth at bedtime.    Yes Historical Provider, MD  levothyroxine (SYNTHROID, LEVOTHROID) 50 MCG tablet TAKE 1 TABLET BY MOUTH EVERY DAY Patient taking differently: Take 50 mcg by mouth at bedtime.  01/03/14  Yes Kem Boroughs, FNP  metoprolol succinate (TOPROL-XL) 50 MG 24 hr tablet Take 1 and 1/2 tablet (75mg ) Daily 04/01/14  Yes Belva Crome, MD  estradiol [facility]) 2 MG vaginal ring Place 2 mg vaginally every 3 (three) months. 01/03/14   Kem Boroughs, FNP     Vital Signs: BP 103/63 mmHg  Pulse 79  Temp(Src) 98.7 F (37.1 C) (Axillary)  Resp 14  Ht 5\' 6"  (1.676 m)  Wt 175 lb 0.7 oz (79.4 kg)  BMI 28.27 kg/m2  SpO2 100%  LMP 11/08/2001  Physical Exam intubated, F/C, moving all fours, PERRL, rt CFA site clean, soft, no sig hematoma, 1+ distal pulses  Imaging: Ct Head Wo Contrast  01/03/2015   CLINICAL DATA:  Status post revascularization of the right middle cerebral artery. Code stroke. Acute onset of slurred speech, right gaze deviation and left sided paralysis.  EXAM: CT HEAD WITHOUT CONTRAST  TECHNIQUE: Contiguous axial images were obtained from the base of the skull through the vertex without intravenous contrast.  COMPARISON:  CT head without contrast from the same day.  FINDINGS: No significant hemorrhage is present. Basal ganglia are intact. The decreased density the anterior right insular cortex persists. No additional cortical defects are evident. Mild periventricular white matter hypoattenuation is stable.  The ventricles are of  normal size. No significant extra-axial fluid collection is present.  Fluid is present in the nasopharynx. The patient is intubated. Mild mucosal thickening is present in the anterior ethmoid air cells and inferior frontal sinuses. The mastoid air cells are clear. The calvarium is intact. The globes and orbits are within normal limits.  IMPRESSION: 1. Stable slight loss of density in the anterior right insular cortex. 2. No evidence for progression of infarct following revascularization of the right middle cerebral artery. 3. No acute hemorrhage.   Electronically Signed   By: San Morelle M.D.   On: 01/03/2015 16:57   Ct Head Wo Contrast  01/03/2015   CLINICAL DATA:  Left-sided weakness with slurred speech  EXAM: CT HEAD WITHOUT CONTRAST  TECHNIQUE: Contiguous axial images were obtained from the base of the skull through the vertex without intravenous contrast.  COMPARISON:  April 18, 2009  FINDINGS: The ventricles are normal in size and configuration. There is no intracranial mass, hemorrhage, extra-axial fluid collection or midline shift. There is diffuse increased attenuation in the right middle cerebral artery, concerning for thrombosis in this vessel/early right middle cerebral artery dystrophy new shin infarct. There is subtle loss of detail of the right insula, concerning for early edema in this area. Elsewhere gray-white compartments are normal. Bony calvarium appears intact. The mastoid air cells are clear.  IMPRESSION: Increased attenuation in the right middle cerebral artery concerning for earliest changes of infarct/thrombosis in this region. Subtle loss of right insular cortex detail, and other finding suggesting  early right middle cerebral artery distribution infarct. Elsewhere gray-white compartments appear within normal limits. No hemorrhage or mass effect.  Critical Value/emergent results were called by telephone at the time of interpretation on 01/03/2015 at 2:31 pm to Dr. Aram Beecham,  neurology, who verbally acknowledged these results.   Electronically Signed   By: Lowella Grip III M.D.   On: 01/03/2015 14:32   Dg Chest Port 1 View  01/03/2015   CLINICAL DATA:  Status post intubation and OG tube placement.  EXAM: PORTABLE CHEST - 1 VIEW  COMPARISON:  None.  FINDINGS: Endotracheal tube is in place with the tip in good position at the level of the clavicular heads well above the carina. OG tube is also in good position with the side-port in the stomach. There is a small left pleural effusion and mild basilar atelectasis. The right lung is clear. No pneumothorax identified. Heart size is normal.  IMPRESSION: ET tube and NG tube in good position.  Small left pleural effusion and basilar atelectasis.   Electronically Signed   By: Inge Rise M.D.   On: 01/03/2015 17:58    Labs:  CBC:  Recent Labs  02/02/14 1518 01/03/15 1419 01/03/15 1420 01/04/15 0500  WBC 6.8  --  7.0 9.4  HGB 13.6 15.6* 13.6 11.2*  HCT 40.3 46.0 40.5 33.0*  PLT 235  --  210 190    COAGS:  Recent Labs  01/03/15 1420  INR 1.02  APTT 27    BMP:  Recent Labs  02/02/14 1518 01/03/15 1419 01/03/15 1420 01/04/15 0500  NA 137 140 136 136  K 4.4 4.5 4.5 3.9  CL 99 102 102 106  CO2 27  --  24 24  GLUCOSE 100* 127* 126* 118*  BUN 13 19 14 8   CALCIUM 9.5  --  9.2 7.8*  CREATININE 0.87 0.80 0.88 0.79  GFRNONAA  --   --  >60 >60  GFRAA  --   --  >60 >60    LIVER FUNCTION TESTS:  Recent Labs  02/02/14 1518 01/03/15 1420  BILITOT 0.9 1.0  AST 32 51*  ALT 61* 93*  ALKPHOS 48 50  PROT 7.3 7.7  ALBUMIN 4.4 4.0    Assessment and Plan: Rt MCA CVA; s/p rt common carotiod arteriogram,followed by complete revascularization of RT MCA prox M1 occlusion withx1 pass with Solitaire 31mm x 40 mm Fr retrieval device and 7 mg of superselective IA tpa achieving a TICI 3 revascularization 7/26; check MRI brain; possible extubation later today; other plans as per neuro/CCM   Signed: D.  Rowe Robert 01/04/2015, 1:54 PM   I spent a total of 15 minutes in face to face in clinical consultation/evaluation, greater than 50% of which was counseling/coordinating care for post cerebral arteriogram/endovascular intervention

## 2015-01-04 NOTE — Progress Notes (Signed)
58f sheath removed no complications  Pt needed to be sedated   Exoseal deployed pressure dressing  10lb sandbag applied

## 2015-01-04 NOTE — Progress Notes (Signed)
VASCULAR LAB PRELIMINARY  PRELIMINARY  PRELIMINARY  PRELIMINARY  Carotid duplex  completed.    Preliminary report:  Bilateral:  1-39% ICA stenosis.  Vertebral artery flow is antegrade.      Iver Miklas, RVT 01/04/2015, 5:25 PM

## 2015-01-04 NOTE — Procedures (Signed)
Extubation Procedure Note  Patient Details:   Name: Rhonda Paul DOB: 1950/10/07 MRN: [id number]   Airway Documentation:     Evaluation  O2 sats: stable throughout Complications: No apparent complications Patient did tolerate procedure well. Bilateral Breath Sounds: Diminished Suctioning: Oral Yes Order placed for Extubation once pt. wakes up more, pt. Extubated without  difficulty to 2 lpm n/c, no s/p Stridor noted, RN @ bedside,RT to monitor. Winferd Humphrey 01/04/2015, 6:32 PM

## 2015-01-04 NOTE — Progress Notes (Signed)
SLP Cancellation Note  Patient Details Name: Rhonda Paul MRN: [id number] DOB: 1950/09/28   Cancelled treatment:       Reason Eval/Treat Not Completed: Patient not medically ready   Verbon Giangregorio, Katherene Ponto 01/04/2015, 7:35 AM

## 2015-01-04 NOTE — Progress Notes (Signed)
Utilization Review Completed.Gerald Kuehl T7/27/2016  

## 2015-01-04 NOTE — Progress Notes (Signed)
PULMONARY / CRITICAL CARE MEDICINE   Name: Rhonda Paul MRN: [id number] DOB: [birth year]    ADMISSION DATE:  01/03/2015 CONSULTATION DATE:  01/03/15  REFERRING MD :  Dr. Leonie Man   CHIEF COMPLAINT:  AMS  INITIAL PRESENTATION:   64 y/o F, non-smoker, with PMH of adenocarcinoma of the breast (2003) s/p chemotherapy, migraines, HTN, permanent atrial fibrillation (not on anticoagulation, ASA only + lopressor) who presented to [facility] on 7/26 with acute R MCA stroke. The patient was taken emergently to neuro-interventional radiology for potential revascularization. PCCM consulted for post-operative ventilator management.    STUDIES:  7/26  CT Head >> increased attenuation in the R MCA concerning for infarct/thrombosis in this region, subtle loss of R insular cortex detail suggestive of early R MCA CVA  SIGNIFICANT EVENTS: 7/26  Admit with L sided weakness. Work up concerning for R MCA infarct.  To neuro IR.  7/26: S/P rt common carotiod arteriogram,followed by complete revascularization of RT MCA prox M1 occlusion withx1 pass with Solitaire 90mm x 40 mm Fr retrieval device and 7 mg of superselective IA tpa achieving a TICI 3 revascularization        VITAL SIGNS: Temp:  [96.1 F (35.6 C)-99.3 F (37.4 C)] 98.7 F (37.1 C) (07/27 0800) Pulse Rate:  [25-118] 71 (07/27 0859) Resp:  [13-22] 14 (07/27 0859) BP: (91-138)/(52-84) 111/80 mmHg (07/27 0859) SpO2:  [82 %-100 %] 97 % (07/27 0859) Arterial Line BP: (92-153)/(52-78) 100/78 mmHg (07/27 0830) FiO2 (%):  [40 %] 40 % (07/27 0900) Weight:  [79.4 kg (175 lb 0.7 oz)-79.9 kg (176 lb 2.4 oz)] 79.4 kg (175 lb 0.7 oz) (07/27 0458)   HEMODYNAMICS:     VENTILATOR SETTINGS: Vent Mode:  [-] PRVC FiO2 (%):  [40 %] 40 % Set Rate:  [14 bmp] 14 bmp Vt Set:  [500 mL] 500 mL PEEP:  [5 cmH20] 5 cmH20 Plateau Pressure:  [13 cmH20-18 cmH20] 16 cmH20   INTAKE / OUTPUT:  Intake/Output Summary (Last 24 hours) at 01/04/15 3419 Last data filed at  01/04/15 0800  Gross per 24 hour  Intake 2203.25 ml  Output   1950 ml  Net 253.25 ml    PHYSICAL EXAMINATION: General:  Overweight female on vent in NAD Neuro:  Sedated on vent, moving all ext; follows commands  HEENT:  [place]/AT, no JVD noted Cardiovascular:  IRIR, No MRG Lungs:  Coarse L > R Abdomen:  Soft, non-tender, non-distended Musculoskeletal:  No acute deformity or edema Skin:  Grossly intact  LABS:  CBC  Recent Labs Lab 01/03/15 1419 01/03/15 1420 01/04/15 0500  WBC  --  7.0 9.4  HGB 15.6* 13.6 11.2*  HCT 46.0 40.5 33.0*  PLT  --  210 190   Coag's  Recent Labs Lab 01/03/15 1420  APTT 27  INR 1.02   BMET  Recent Labs Lab 01/03/15 1419 01/03/15 1420 01/04/15 0500  NA 140 136 136  K 4.5 4.5 3.9  CL 102 102 106  CO2  --  24 24  BUN 19 14 8   CREATININE 0.80 0.88 0.79  GLUCOSE 127* 126* 118*   Electrolytes  Recent Labs Lab 01/03/15 1420 01/04/15 0500  CALCIUM 9.2 7.8*   Sepsis Markers No results for input(s): LATICACIDVEN, PROCALCITON, O2SATVEN in the last 168 hours.   ABG  Recent Labs Lab 01/03/15 1800  PHART 7.360  PCO2ART 40.2  PO2ART 135*     Liver Enzymes  Recent Labs Lab 01/03/15 1420  AST 51*  ALT 93*  ALKPHOS 50  BILITOT 1.0  ALBUMIN 4.0   Cardiac Enzymes No results for input(s): TROPONINI, PROBNP in the last 168 hours.   Glucose  Recent Labs Lab 01/03/15 1429 01/03/15 1812 01/03/15 2025 01/04/15 01/04/15 0423 01/04/15 0816  GLUCAP 111* 91 75 80 89 98    Imaging Ct Head Wo Contrast  01/03/2015   CLINICAL DATA:  Status post revascularization of the right middle cerebral artery. Code stroke. Acute onset of slurred speech, right gaze deviation and left sided paralysis.  EXAM: CT HEAD WITHOUT CONTRAST  TECHNIQUE: Contiguous axial images were obtained from the base of the skull through the vertex without intravenous contrast.  COMPARISON:  CT head without contrast from the same day.  FINDINGS: No significant  hemorrhage is present. Basal ganglia are intact. The decreased density the anterior right insular cortex persists. No additional cortical defects are evident. Mild periventricular white matter hypoattenuation is stable.  The ventricles are of normal size. No significant extra-axial fluid collection is present.  Fluid is present in the nasopharynx. The patient is intubated. Mild mucosal thickening is present in the anterior ethmoid air cells and inferior frontal sinuses. The mastoid air cells are clear. The calvarium is intact. The globes and orbits are within normal limits.  IMPRESSION: 1. Stable slight loss of density in the anterior right insular cortex. 2. No evidence for progression of infarct following revascularization of the right middle cerebral artery. 3. No acute hemorrhage.   Electronically Signed   By: San Morelle M.D.   On: 01/03/2015 16:57   Ct Head Wo Contrast  01/03/2015   CLINICAL DATA:  Left-sided weakness with slurred speech  EXAM: CT HEAD WITHOUT CONTRAST  TECHNIQUE: Contiguous axial images were obtained from the base of the skull through the vertex without intravenous contrast.  COMPARISON:  April 18, 2009  FINDINGS: The ventricles are normal in size and configuration. There is no intracranial mass, hemorrhage, extra-axial fluid collection or midline shift. There is diffuse increased attenuation in the right middle cerebral artery, concerning for thrombosis in this vessel/early right middle cerebral artery dystrophy new shin infarct. There is subtle loss of detail of the right insula, concerning for early edema in this area. Elsewhere gray-white compartments are normal. Bony calvarium appears intact. The mastoid air cells are clear.  IMPRESSION: Increased attenuation in the right middle cerebral artery concerning for earliest changes of infarct/thrombosis in this region. Subtle loss of right insular cortex detail, and other finding suggesting early right middle cerebral artery  distribution infarct. Elsewhere gray-white compartments appear within normal limits. No hemorrhage or mass effect.  Critical Value/emergent results were called by telephone at the time of interpretation on 01/03/2015 at 2:31 pm to Dr. Aram Beecham, neurology, who verbally acknowledged these results.   Electronically Signed   By: Lowella Grip III M.D.   On: 01/03/2015 14:32   Dg Chest Port 1 View  01/03/2015   CLINICAL DATA:  Status post intubation and OG tube placement.  EXAM: PORTABLE CHEST - 1 VIEW  COMPARISON:  None.  FINDINGS: Endotracheal tube is in place with the tip in good position at the level of the clavicular heads well above the carina. OG tube is also in good position with the side-port in the stomach. There is a small left pleural effusion and mild basilar atelectasis. The right lung is clear. No pneumothorax identified. Heart size is normal.  IMPRESSION: ET tube and NG tube in good position.  Small left pleural effusion and basilar atelectasis.   Electronically Signed  By: Inge Rise M.D.   On: 01/03/2015 17:58     ASSESSMENT / PLAN:  NEUROLOGIC A:   R MCA CVA - s/p tpa, neuro-intervention   P:   RASS goal: -1 Propofol for sedation Neuro checks per protocol Stroke team following  PULMONARY OETT 7/26 >>  A: Acute Respiratory Failure - in setting of neurologic dysfunction / CVA  > appears ready for extubation  P:   SBT as soon as sheath removed Aspiration precautions pulm hygiene   CARDIOVASCULAR A:  HTN  Permanent Atrial Fibrillation - [place] Vasc score 1 as of last cardiology OV, aspirin only.   P:  Blood pressure parameters per Neuro  ICU monitoring of hemodynamics  Assess ECHO, carotid dopplers  Phenylephrine PRN Nicardipine PRN  RENAL A:   No acute issues   P:   Trend BMP / UOP   GASTROINTESTINAL A:   Vent associated Dysphagia   P:   NPO  OGT  Pepcid for SUP Consider TF in am 7/28  Will need SLP evaluation post extubation    HEMATOLOGIC A:   Macrocytosis  P:  Trend CBC  DVT ppx:  Per stroke team  INFECTIOUS A:   No acute infectious process   P:   Monitor fever curve / leukocytosis   ENDOCRINE A:   Hyperglycemia     P:   CBG monitoring and SSI    FAMILY  - Updates:   - Inter-disciplinary family meet or Palliative Care meeting due by:  01/10/2015  Should be ready to extubate. Still has sheath. Would rather hold off on removing ETT until she can have head up. Have discussed w/ nursing staff. Plan will be to wean w/ hopes to extubate 4 hrs after arterial sheath has been removed.   Erick Colace ACNP-BC [facility] Pulmonary/Critical Care Pager # [phone] OR # [phone] if no answer  Attending Note:  64 year old female with history of adenoca of the lung presenting with a LMCA CVA, not on anticoag with afib.  Patient was taken to IR yesterday.  Sheaths are out today.  Going to MRI now.  More responsive on exam.  Will MRI today and if stable will consider weaning to extubate later on today.  The patient is critically ill with multiple organ systems failure and requires high complexity decision making for assessment and support, frequent evaluation and titration of therapies, application of advanced monitoring technologies and extensive interpretation of multiple databases.   Critical Care Time devoted to patient care services described in this note is  35  Minutes. This time reflects time of care of this signee Dr Jennet Maduro. This critical care time does not reflect procedure time, or teaching time or supervisory time of PA/NP/Med student/Med Resident etc but could involve care discussion time.  Rush Farmer, M.D. [facility] Pulmonary/Critical Care Medicine. Pager: [phone]. After hours pager: [phone].

## 2015-01-04 NOTE — Anesthesia Postprocedure Evaluation (Signed)
Anesthesia Post Note  Patient: Rhonda Paul  Procedure(s) Performed: Procedure(s) (LRB): RADIOLOGY WITH ANESTHESIA (N/A)  Anesthesia type: General  Patient location: ICU  Post pain: Pain level controlled  Post assessment: Post-op Vital signs reviewed  Last Vitals:  Filed Vitals:   01/04/15 1500  BP: 106/69  Pulse:   Temp:   Resp:     Post vital signs: stable  Level of consciousness: Patient remains intubated per anesthesia plan  Complications: No apparent anesthesia complications

## 2015-01-04 NOTE — Progress Notes (Signed)
PT Cancellation Note  Patient Details Name: Rhonda Paul MRN: [id number] DOB: 08-08-1950   Cancelled Treatment:    Reason Eval/Treat Not Completed: Patient not medically ready (active bedrest orders at this time)   Duncan Dull 01/04/2015, 7:15 AM Alben Deeds, PT DPT  [phone]

## 2015-01-04 NOTE — Progress Notes (Signed)
OT Cancellation Note  Patient Details Name: JISSEL SLAVENS MRN: [id number] DOB: 1951/03/09   Cancelled Treatment:    Reason Eval/Treat Not Completed: Other (comment) Pt on bedrest. Please update activity orders when appropriate to begin therapy. Thanks Og[place], OTR/L  [phone] 01/04/2015 01/04/2015, 7:07 AM

## 2015-01-05 ENCOUNTER — Ambulatory Visit (HOSPITAL_COMMUNITY): Payer: 59

## 2015-01-05 ENCOUNTER — Other Ambulatory Visit: Payer: Self-pay | Admitting: Radiology

## 2015-01-05 ENCOUNTER — Encounter (HOSPITAL_COMMUNITY): Payer: Self-pay | Admitting: *Deleted

## 2015-01-05 ENCOUNTER — Inpatient Hospital Stay (HOSPITAL_COMMUNITY): Payer: 59

## 2015-01-05 DIAGNOSIS — I6789 Other cerebrovascular disease: Secondary | ICD-10-CM

## 2015-01-05 DIAGNOSIS — I63411 Cerebral infarction due to embolism of right middle cerebral artery: Secondary | ICD-10-CM

## 2015-01-05 DIAGNOSIS — I482 Chronic atrial fibrillation: Secondary | ICD-10-CM

## 2015-01-05 LAB — GLUCOSE, CAPILLARY: Glucose-Capillary: 76 mg/dL (ref 65–99)

## 2015-01-05 LAB — CBC
HCT: 34.8 % — ABNORMAL LOW (ref 36.0–46.0)
Hemoglobin: 11.4 g/dL — ABNORMAL LOW (ref 12.0–15.0)
MCH: 34.4 pg — ABNORMAL HIGH (ref 26.0–34.0)
MCHC: 32.8 g/dL (ref 30.0–36.0)
MCV: 105.1 fL — ABNORMAL HIGH (ref 78.0–100.0)
Platelets: 152 10*3/uL (ref 150–400)
RBC: 3.31 MIL/uL — ABNORMAL LOW (ref 3.87–5.11)
RDW: 13.3 % (ref 11.5–15.5)
WBC: 7.1 10*3/uL (ref 4.0–10.5)

## 2015-01-05 LAB — COMPREHENSIVE METABOLIC PANEL
ALT: 42 U/L (ref 14–54)
AST: 22 U/L (ref 15–41)
Albumin: 2.9 g/dL — ABNORMAL LOW (ref 3.5–5.0)
Alkaline Phosphatase: 44 U/L (ref 38–126)
Anion gap: 9 (ref 5–15)
BUN: <5 mg/dL — ABNORMAL LOW (ref 6–20)
CO2: 22 mmol/L (ref 22–32)
Calcium: 8.3 mg/dL — ABNORMAL LOW (ref 8.9–10.3)
Chloride: 111 mmol/L (ref 101–111)
Creatinine, Ser: 0.8 mg/dL (ref 0.44–1.00)
GFR calc Af Amer: >60 mL/min (ref >60)
GFR calc non Af Amer: >60 mL/min (ref >60)
Glucose, Bld: 100 mg/dL — ABNORMAL HIGH (ref 65–99)
Potassium: 3.5 mmol/L (ref 3.5–5.1)
Sodium: 142 mmol/L (ref 135–145)
Total Bilirubin: 1.4 mg/dL — ABNORMAL HIGH (ref 0.3–1.2)
Total Protein: 5.8 g/dL — ABNORMAL LOW (ref 6.5–8.1)

## 2015-01-05 LAB — HEMOGLOBIN A1C
Hgb A1c MFr Bld: 6 % — ABNORMAL HIGH (ref 4.8–5.6)
Mean Plasma Glucose: 126 mg/dL

## 2015-01-05 MED ORDER — ASPIRIN 325 MG PO TABS
325.0000 mg | ORAL_TABLET | Freq: Every day | ORAL | Status: DC
  Administered 2015-01-05 – 2015-01-06 (×2): 325 mg via ORAL
  Filled 2015-01-05 (×2): qty 1

## 2015-01-05 NOTE — Progress Notes (Signed)
Patient transferred form unit 35M to room 4N02 at 1515. Alert and in stable condition.

## 2015-01-05 NOTE — Progress Notes (Signed)
SLP Cancellation Note  Patient Details Name: Rhonda Paul MRN: [id number] DOB: [birth year]   Cancelled treatment:       Reason Eval/Treat Not Completed: SLP screened, no needs identified, will sign off.  Spoke with OT and patient, who confirm that patient has returned to baseline level of functioning. Please see OT evaluation for full details.  SLP signing off at this time.  Please reorder if needed.    Gunnar Fusi, M.A., CCC-SLP [phone]  [employer] 01/05/2015, 2:15 PM

## 2015-01-05 NOTE — Progress Notes (Signed)
  Echocardiogram 2D Echocardiogram has been performed.  Donata Clay 01/05/2015, 4:08 PM

## 2015-01-05 NOTE — Progress Notes (Signed)
STROKE TEAM PROGRESS NOTE   HISTORY Rhonda Paul is an 64 y.o. female with history of HTN, Afib taking ASA, breast cancer s/p right mastectomy+XRT+chemotherapy, and migraine. Today 01/03/2015 she had gone out to lunch and when she returned to work it was noted she was slurring her words (LKW 01/03/2015 at 1320). EMS was called and on arrival she had a right gaze deviation and left flaccid paralysis. On arrival to ED she no longer had a right gaze preference but continued to show a left hemiplegia and left field cut. Complains of HA that has been present since earlier today but denies vertigo, double vision, difficulty swallowing, or visual disturbances. Modified Rankin: Rankin Score=0. NIHSS 10. CT head was reviewed and showed subtle loss of right insular cortex (ASPECTS 9) as well as hyperdense right MCA sign. IV tPA was given at 1434 and IR was consulted. Pt had complete revascularization of RT MCA prox M1 occlusion with x1 pass with Solitaire and 7 mg of IA tpa achieving a TICI 3 revascularization. She was admitted to the neuro ICU for further evaluation and treatment.   SUBJECTIVE (INTERVAL HISTORY) Her RN is at the bedside.  Overall she feels her condition is stable. She was extubated yesterday and has done well. She appears to have made a full neurological recovery with no deficits. She remains in atrial fibrillation/flutter OBJECTIVE Temp:  [98 F (36.7 C)-98.6 F (37 C)] 98 F (36.7 C) (07/28 0801) Pulse Rate:  [68-87] 81 (07/28 1300) Cardiac Rhythm:  [-] Atrial fibrillation (07/28 0800) Resp:  [14-24] 22 (07/28 1300) BP: (96-143)/(53-86) 132/75 mmHg (07/28 1300) SpO2:  [91 %-100 %] 91 % (07/28 1300) Arterial Line BP: (88-145)/(80-111) 139/80 mmHg (07/28 1000) FiO2 (%):  [40 %] 40 % (07/28 0800) Weight:  [174 lb 2.6 oz (79 kg)] 174 lb 2.6 oz (79 kg) (07/28 0500)   Recent Labs Lab 01/04/15 0423 01/04/15 0816 01/04/15 1147 01/04/15 2139 01/05/15 0759  GLUCAP 89 98 95 104* 76     Recent Labs Lab 01/03/15 1419 01/03/15 1420 01/04/15 0500 01/05/15 0317  NA 140 136 136 142  K 4.5 4.5 3.9 3.5  CL 102 102 106 111  CO2  --  24 24 22   GLUCOSE 127* 126* 118* 100*  BUN 19 14 8  <5*  CREATININE 0.80 0.88 0.79 0.80  CALCIUM  --  9.2 7.8* 8.3*    Recent Labs Lab 01/03/15 1420 01/05/15 0317  AST 51* 22  ALT 93* 42  ALKPHOS 50 44  BILITOT 1.0 1.4*  PROT 7.7 5.8*  ALBUMIN 4.0 2.9*    Recent Labs Lab 01/03/15 1419 01/03/15 1420 01/04/15 0500 01/05/15 0317  WBC  --  7.0 9.4 7.1  NEUTROABS  --  3.7 6.9  --   HGB 15.6* 13.6 11.2* 11.4*  HCT 46.0 40.5 33.0* 34.8*  MCV  --  103.3* 101.5* 105.1*  PLT  --  210 190 152   No results for input(s): CKTOTAL, CKMB, CKMBINDEX, TROPONINI in the last 168 hours.  Recent Labs  01/03/15 1420  LABPROT 13.6  INR 1.02    Recent Labs  01/03/15 1433  COLORURINE YELLOW  LABSPEC 1.024  PHURINE 6.5  GLUCOSEU NEGATIVE  HGBUR NEGATIVE  BILIRUBINUR NEGATIVE  KETONESUR NEGATIVE  PROTEINUR NEGATIVE  UROBILINOGEN 0.2  NITRITE NEGATIVE  LEUKOCYTESUR NEGATIVE       Component Value Date/Time   CHOL 236* 01/04/2015 0500   TRIG 166* 01/04/2015 0500   HDL 61 01/04/2015 0500   CHOLHDL 3.9  01/04/2015 0500   VLDL 33 01/04/2015 0500   LDLCALC 142* 01/04/2015 0500   Lab Results  Component Value Date   HGBA1C 6.0* 01/04/2015      Component Value Date/Time   LABOPIA NONE DETECTED 01/03/2015 1433   COCAINSCRNUR NONE DETECTED 01/03/2015 1433   LABBENZ NONE DETECTED 01/03/2015 1433   AMPHETMU NONE DETECTED 01/03/2015 1433   THCU NONE DETECTED 01/03/2015 1433   LABBARB NONE DETECTED 01/03/2015 1433     Recent Labs Lab 01/03/15 1420  ETH <5    Ct Head Wo Contrast  01/03/2015   CLINICAL DATA:  Status post revascularization of the right middle cerebral artery. Code stroke. Acute onset of slurred speech, right gaze deviation and left sided paralysis.  EXAM: CT HEAD WITHOUT CONTRAST  TECHNIQUE: Contiguous axial  images were obtained from the base of the skull through the vertex without intravenous contrast.  COMPARISON:  CT head without contrast from the same day.  FINDINGS: No significant hemorrhage is present. Basal ganglia are intact. The decreased density the anterior right insular cortex persists. No additional cortical defects are evident. Mild periventricular white matter hypoattenuation is stable.  The ventricles are of normal size. No significant extra-axial fluid collection is present.  Fluid is present in the nasopharynx. The patient is intubated. Mild mucosal thickening is present in the anterior ethmoid air cells and inferior frontal sinuses. The mastoid air cells are clear. The calvarium is intact. The globes and orbits are within normal limits.  IMPRESSION: 1. Stable slight loss of density in the anterior right insular cortex. 2. No evidence for progression of infarct following revascularization of the right middle cerebral artery. 3. No acute hemorrhage.   Electronically Signed   By: San Morelle M.D.   On: 01/03/2015 16:57   Ct Head Wo Contrast  01/03/2015   CLINICAL DATA:  Left-sided weakness with slurred speech  EXAM: CT HEAD WITHOUT CONTRAST  TECHNIQUE: Contiguous axial images were obtained from the base of the skull through the vertex without intravenous contrast.  COMPARISON:  April 18, 2009  FINDINGS: The ventricles are normal in size and configuration. There is no intracranial mass, hemorrhage, extra-axial fluid collection or midline shift. There is diffuse increased attenuation in the right middle cerebral artery, concerning for thrombosis in this vessel/early right middle cerebral artery dystrophy new shin infarct. There is subtle loss of detail of the right insula, concerning for early edema in this area. Elsewhere gray-white compartments are normal. Bony calvarium appears intact. The mastoid air cells are clear.  IMPRESSION: Increased attenuation in the right middle cerebral artery  concerning for earliest changes of infarct/thrombosis in this region. Subtle loss of right insular cortex detail, and other finding suggesting early right middle cerebral artery distribution infarct. Elsewhere gray-white compartments appear within normal limits. No hemorrhage or mass effect.  Critical Value/emergent results were called by telephone at the time of interpretation on 01/03/2015 at 2:31 pm to Dr. Aram Beecham, neurology, who verbally acknowledged these results.   Electronically Signed   By: Lowella Grip III M.D.   On: 01/03/2015 14:32   Mr Brain Wo Contrast  01/04/2015   CLINICAL DATA:  Right-sided CVA. Revascularization of the right middle cerebral artery yesterday. The patient remains intubated but as purposeful movements on the left.  EXAM: MRI HEAD WITHOUT CONTRAST  TECHNIQUE: Multiplanar, multiecho pulse sequences of the brain and surrounding structures were obtained without intravenous contrast.  COMPARISON:  CT head from 01/03/2015.  FINDINGS: The diffusion-weighted images confirm an acute nonhemorrhagic infarct within  the anterior insular cortex. An acute nonhemorrhagic infarct is evident in the posterior right lentiform nucleus as well. Acute nonhemorrhagic cortical infarct is present within the anterior right temporal lobe extending to the temporal tip. More subtle acute nonhemorrhagic infarct is present in the posterior right temporal and anterior inferior right parietal lobe.  T2 and FLAIR signal changes are associated with the areas of acute infarct. There is no evidence for acute or chronic hemorrhage. Focal T2 changes are present within the left parietal lobe suggesting a more remote cortical infarct. No acute infarct is present on the left.  Slight signal abnormalities are present in the distal right M1 segment suggesting slow flow. There is flow in the MCA branch vessels. Flow is present in the major intracranial arteries otherwise.  The patient is intubated. Fluid is present within  the nasopharynx. Mild mucosal thickening is present throughout the ethmoid air cells and frontal sinuses. There is a small fluid level in the left sphenoid sinus. A small amount of mastoid fluid is present bilaterally as well.  IMPRESSION: 1. Acute nonhemorrhagic infarcts involving the anterior right temporal lobe, anterior inferior right parietal lobe, anterior insular cortex, and posterior right lentiform nucleus. 2. Focal remote left parietal lobe infarct. 3. Extensive sinus disease with fluid in the nasopharynx. The symptoms are all likely related to the presence of the endotracheal tube. 4. Minimal signal abnormality is within the distal right M1 segment suggesting slow flow. There is no obstruction evident on today's study.   Electronically Signed   By: San Morelle M.D.   On: 01/04/2015 16:02   Dg Chest Port 1 View  01/05/2015   CLINICAL DATA:  Acute respiratory failure with hypoxia, atelectasis, and CVA.  EXAM: PORTABLE CHEST - 1 VIEW  COMPARISON:  Portable chest x-ray of January 03, 2015  FINDINGS: There has been interval extubation of the trachea and of the esophagus. The lungs are adequately inflated. There is increased retrocardiac density on the left today. A small left pleural effusion persists. New right infrahilar subsegmental atelectasis is noted as well. The pulmonary interstitial markings are more conspicuous bilaterally as well. The heart is normal in size. The pulmonary vascularity is not engorged.  IMPRESSION: 1. Increased bibasilar atelectasis with increased interstitial edema elsewhere in both lungs. Stable small left pleural effusion. 2. Interval extubation of the trachea and esophagus.   Electronically Signed   By: David  Martinique M.D.   On: 01/05/2015 08:01   Dg Chest Port 1 View  01/03/2015   CLINICAL DATA:  Status post intubation and OG tube placement.  EXAM: PORTABLE CHEST - 1 VIEW  COMPARISON:  None.  FINDINGS: Endotracheal tube is in place with the tip in good position at the  level of the clavicular heads well above the carina. OG tube is also in good position with the side-port in the stomach. There is a small left pleural effusion and mild basilar atelectasis. The right lung is clear. No pneumothorax identified. Heart size is normal.  IMPRESSION: ET tube and NG tube in good position.  Small left pleural effusion and basilar atelectasis.   Electronically Signed   By: Inge Rise M.D.   On: 01/03/2015 17:58   Cerebral angiogram 7/26/216 S/P rt common carotiod arteriogram,followed by complete revascularization of RT MCA prox M1 occlusion withx1 pass with Solitaire 72mm x 40 mm Fr retrieval device and 7 mg of superselective IA tpa achieving a TICI 3 revascularization   PHYSICAL EXAM Pleasant middle-aged Caucasian lady . Afebrile. Head is nontraumatic.  Neck is supple without bruit.    Cardiac exam no murmur or gallop. Lungs are clear to auscultation. Distal pulses are well felt. Neurological Exam :  Awake alert oriented 3 with normal speech and language function. Follows commands well. Blinks to threat bilaterally. Pupils equal reactive. Fundi were not visualized. No facial weakness. Tongue midline. No upper extremity drift. Diminished fine finger movements on the left. Orbits right over left upper extremity. Minimum left grip weakness. Good purposeful antigravity movements on the right side. Sensation and coordination normal  d. Left plantar equivocal right downgoing. Gait cannot be tested  NIH  stroke scale score 0  ASSESSMENT/PLAN Rhonda Paul is a 64 y.o. female with history of hypertension, atrial fibrillation on ASA, R mastectomy+XRT+chemo and migraine presenting with slurring her words, right gaze deviation and left flaccid paralysis. She IV alteplase at 1434, s/p complete revascularization of proximal R M1 with Solitaire and IA alteplase.   Stroke:  Non-dominant right brain infarct s/p IV tPA and complete revascularization prox R M1 w/ IA tPA and mechanical  thrombectomy. Stroke embolic secondary to known atrial fibrillation. Patient has made a near complete recovery  Sheath remains in place post IR  ]Resultant Minimalleft-sided weakness  Cerebral angio prox R M1 occlusion  MRI  pending   2D Echo  pending   LDL 142  HgbA1c pending  SCDs for VTE prophylaxis Diet clear liquid Room service appropriate?: Yes; Fluid consistency:: Thin  aspirin 325 mg orally every day prior to admission, now on no antithrombotic post intervention  D/c sheath today  Ongoing aggressive stroke risk factor management  Therapy recommendations:  pending   Disposition:  pending   Respiratory Failure  Intubated for neurointervention  Requiring sedation this as fighting vent  Review imaging prior to extubating  CCM following  Atrial Fibrillation  Home anticoagulation:  None, documented  ChadsVasc2 of 1 PTA. Given stroke stroke, will add 2. Now, CHA2DS2-VASc Score = 3, ?2 oral anticoagulation recommended  On toprol at home   Hypertension  Home meds:   none  Stable  Hyperglycemia, HgbA1c pending, goal < 7.0  Hyperlipidemia  Home meds:  No statin  LDL 142, goal < 70  Add statin once able to swallow  Other Stroke Risk Factors  ETOH use  Migraines  Other Active Problems  Hypothyroid  Macrocytosis  Currently using Estradiol vaginal ring   Other Pertinent History  R breast adenocarcinoma s/p chemo 11/2001  Hospital day # 2  I have personally examined this patient, reviewed notes, independently viewed imaging studies, participated in medical decision making and plan of care. I have made any additions or clarifications directly to the above note.   She presented with right MCA infarct secondary to embolization from atrial fibrillation-flutter. She was treated with IV TPA and mechanical embolectomy with complete recanalization of the right MCA. She remains at risk for neurological worsening, hemorrhage and needs ongoing close  neurological monitoring and tight blood pressure control. Plan to transfer her to the neurological floor today. Mobilize out of bed. Physical occupational therapy consult. Likely discharge home for the next few days after transition from aspirin to anticoagulation with eliquis. Cardiology consult for atrial flutter.   Antony Contras, MD Medical Director [facility] Pager: [phone] 01/05/2015 1:33 PM    To contact Stroke Continuity provider, please refer to [email or web address] After hours, contact General Neurology

## 2015-01-05 NOTE — Evaluation (Signed)
Occupational Therapy Evaluation Patient Details Name: MATHILDA MAGUIRE MRN: [id number] DOB: 1951/05/17 Today's Date: 01/05/2015    History of Present Illness KEELEIGH TERRIS is an 64 y.o. female with history of HTN, Afib taking ASA, breast cancer s/p right mastectomy+XRT+chemotherapy, and migraine.Initially on admission she had a right aze deviation and left flaccid paralysis.  MRI showed acute nonhemorrhagic infarcts involving the anterior right temporal lobe, anterior inferior right parietal lobe, anterior insular cortex, and posterior right lentiform nucleus.  Pt taken to IR and M1 was completed revascularized.   Clinical Impression   Pt admitted with above.  She appears to be back to baseline.   She is able to perform ADLs independently.  Cognition and vision appear intact.  She was instructed in BEFAST.   No further OT recommended, will sign off.     Follow Up Recommendations  No OT follow up;Supervision - Intermittent    Equipment Recommendations  None recommended by OT    Recommendations for Other Services       Precautions / Restrictions Precautions Precautions: None Restrictions Weight Bearing Restrictions: No      Mobility Bed Mobility Overal bed mobility: Independent                Transfers Overall transfer level: Independent Equipment used: None                  Balance Overall balance assessment: No apparent balance deficits (not formally assessed) Sitting-balance support: Feet supported;No upper extremity supported Sitting balance-Leahy Scale: Good     Standing balance support: During functional activity;No upper extremity supported Standing balance-Leahy Scale: Good                              ADL Overall ADL's : Independent                                             Vision Vision Assessment?: Yes Eye Alignment: Within Functional Limits Ocular Range of Motion: Within Functional  Limits Alignment/Gaze Preference: Within Defined Limits Tracking/Visual Pursuits: Able to track stimulus in all quads without difficulty Saccades: Within functional limits Visual Fields: No apparent deficits Additional Comments: Pt does not have reading glasses with her.  She was, however, able to read bold print of newspaper without difficulty    Perception Perception Perception Tested?: Yes   Praxis Praxis Praxis tested?: Within functional limits    Pertinent Vitals/Pain Pain Assessment: No/denies pain     Hand Dominance Right   Extremity/Trunk Assessment Upper Extremity Assessment Upper Extremity Assessment: Overall WFL for tasks assessed   Lower Extremity Assessment Lower Extremity Assessment: Defer to PT evaluation   Cervical / Trunk Assessment Cervical / Trunk Assessment: Normal   Communication Communication Communication: No difficulties   Cognition Arousal/Alertness: Awake/alert Behavior During Therapy: WFL for tasks assessed/performed Overall Cognitive Status: Within Functional Limits for tasks assessed                     General Comments       Exercises       Shoulder Instructions      Home Living Family/patient expects to be discharged to:: Private residence Living Arrangements: Spouse/significant other Available Help at Discharge: Family Type of Home: House Home Access: Stairs to enter [employer] of Steps: several Entrance Stairs-Rails: Right;Left Home  Layout: Able to live on main level with bedroom/bathroom;Two level;Other (Comment) (doesn't have to go to basement often)                          Prior Functioning/Environment Level of Independence: Independent        Comments: Pt works full time as a Land - owns own company     OT Diagnosis:     OT Problem List:     OT Treatment/Interventions:      OT Goals(Current goals can be found in the care plan section) Acute Rehab OT Goals OT Goal  Formulation: All assessment and education complete, DC therapy  OT Frequency:     Barriers to D/C:            Co-evaluation              End of Session Nurse Communication: Mobility status  Activity Tolerance: Patient tolerated treatment well Patient left: in bed;with call bell/phone within reach   Time: 1238-1259 OT Time Calculation (min): 21 min Charges:  OT General Charges $OT Visit: 1 Procedure OT Evaluation $Initial OT Evaluation Tier I: 1 Procedure G-Codes:    Eriel Dunckel, Ellard Artis M Jan 23, 2015, 1:48 PM

## 2015-01-05 NOTE — Progress Notes (Signed)
Referring Physician(s): Dr. Leonie Man  Chief Complaint: Right MCA CVA S/p successful Rt common carotiod arteriogram,followed by complete revascularization of RT MCA prox M1 occlusion withx1 pass with Solitaire 97mm x 40 mm Fr retrieval device and 7 mg of superselective IA tpa achieving a TICI 3 revascularization  Subjective: Patient is doing well, extubated, denies any extremity weakness or right groin site pain. She denies any headache.   Allergies: Lambs quarters and Other  Medications: Prior to Admission medications   Medication Sig Start Date End Date Taking? Authorizing Provider  aspirin 325 MG EC tablet Take 325 mg by mouth at bedtime.    Yes Historical Provider, MD  levothyroxine (SYNTHROID, LEVOTHROID) 50 MCG tablet TAKE 1 TABLET BY MOUTH EVERY DAY Patient taking differently: Take 50 mcg by mouth at bedtime.  01/03/14  Yes Kem Boroughs, FNP  metoprolol succinate (TOPROL-XL) 50 MG 24 hr tablet Take 1 and 1/2 tablet (75mg ) Daily 04/01/14  Yes Belva Crome, MD  estradiol (ESTRING) 2 MG vaginal ring Place 2 mg vaginally every 3 (three) months. 01/03/14   Kem Boroughs, FNP   Vital Signs: BP 135/62 mmHg  Pulse 73  Temp(Src) 98 F (36.7 C) (Oral)  Resp 20  Ht 5\' 6"  (1.676 m)  Wt 174 lb 2.6 oz (79 kg)  BMI 28.12 kg/m2  SpO2 97%  LMP 11/08/2001  Physical Exam General: A&Ox3, NAD Abd: Soft, NT, ND Ext: RCFA access site dressing C/D/I, soft, NT, no signs of bleeding or hematoma, DP inatct b/l Neuro: Speech clear, smile symmetrical, tongue midline, EOMI, fine motor intact, no ataxia, equal strength upper and lower extremities   Imaging: Ct Head Wo Contrast  01/03/2015   CLINICAL DATA:  Status post revascularization of the right middle cerebral artery. Code stroke. Acute onset of slurred speech, right gaze deviation and left sided paralysis.  EXAM: CT HEAD WITHOUT CONTRAST  TECHNIQUE: Contiguous axial images were obtained from the base of the skull through the vertex without  intravenous contrast.  COMPARISON:  CT head without contrast from the same day.  FINDINGS: No significant hemorrhage is present. Basal ganglia are intact. The decreased density the anterior right insular cortex persists. No additional cortical defects are evident. Mild periventricular white matter hypoattenuation is stable.  The ventricles are of normal size. No significant extra-axial fluid collection is present.  Fluid is present in the nasopharynx. The patient is intubated. Mild mucosal thickening is present in the anterior ethmoid air cells and inferior frontal sinuses. The mastoid air cells are clear. The calvarium is intact. The globes and orbits are within normal limits.  IMPRESSION: 1. Stable slight loss of density in the anterior right insular cortex. 2. No evidence for progression of infarct following revascularization of the right middle cerebral artery. 3. No acute hemorrhage.   Electronically Signed   By: San Morelle M.D.   On: 01/03/2015 16:57   Ct Head Wo Contrast  01/03/2015   CLINICAL DATA:  Left-sided weakness with slurred speech  EXAM: CT HEAD WITHOUT CONTRAST  TECHNIQUE: Contiguous axial images were obtained from the base of the skull through the vertex without intravenous contrast.  COMPARISON:  April 18, 2009  FINDINGS: The ventricles are normal in size and configuration. There is no intracranial mass, hemorrhage, extra-axial fluid collection or midline shift. There is diffuse increased attenuation in the right middle cerebral artery, concerning for thrombosis in this vessel/early right middle cerebral artery dystrophy new shin infarct. There is subtle loss of detail of the right insula, concerning  for early edema in this area. Elsewhere gray-white compartments are normal. Bony calvarium appears intact. The mastoid air cells are clear.  IMPRESSION: Increased attenuation in the right middle cerebral artery concerning for earliest changes of infarct/thrombosis in this region. Subtle  loss of right insular cortex detail, and other finding suggesting early right middle cerebral artery distribution infarct. Elsewhere gray-white compartments appear within normal limits. No hemorrhage or mass effect.  Critical Value/emergent results were called by telephone at the time of interpretation on 01/03/2015 at 2:31 pm to Dr. Aram Beecham, neurology, who verbally acknowledged these results.   Electronically Signed   By: Lowella Grip III M.D.   On: 01/03/2015 14:32   Mr Brain Wo Contrast  01/04/2015   CLINICAL DATA:  Right-sided CVA. Revascularization of the right middle cerebral artery yesterday. The patient remains intubated but as purposeful movements on the left.  EXAM: MRI HEAD WITHOUT CONTRAST  TECHNIQUE: Multiplanar, multiecho pulse sequences of the brain and surrounding structures were obtained without intravenous contrast.  COMPARISON:  CT head from 01/03/2015.  FINDINGS: The diffusion-weighted images confirm an acute nonhemorrhagic infarct within the anterior insular cortex. An acute nonhemorrhagic infarct is evident in the posterior right lentiform nucleus as well. Acute nonhemorrhagic cortical infarct is present within the anterior right temporal lobe extending to the temporal tip. More subtle acute nonhemorrhagic infarct is present in the posterior right temporal and anterior inferior right parietal lobe.  T2 and FLAIR signal changes are associated with the areas of acute infarct. There is no evidence for acute or chronic hemorrhage. Focal T2 changes are present within the left parietal lobe suggesting a more remote cortical infarct. No acute infarct is present on the left.  Slight signal abnormalities are present in the distal right M1 segment suggesting slow flow. There is flow in the MCA branch vessels. Flow is present in the major intracranial arteries otherwise.  The patient is intubated. Fluid is present within the nasopharynx. Mild mucosal thickening is present throughout the ethmoid air  cells and frontal sinuses. There is a small fluid level in the left sphenoid sinus. A small amount of mastoid fluid is present bilaterally as well.  IMPRESSION: 1. Acute nonhemorrhagic infarcts involving the anterior right temporal lobe, anterior inferior right parietal lobe, anterior insular cortex, and posterior right lentiform nucleus. 2. Focal remote left parietal lobe infarct. 3. Extensive sinus disease with fluid in the nasopharynx. The symptoms are all likely related to the presence of the endotracheal tube. 4. Minimal signal abnormality is within the distal right M1 segment suggesting slow flow. There is no obstruction evident on today's study.   Electronically Signed   By: San Morelle M.D.   On: 01/04/2015 16:02   Dg Chest Port 1 View  01/05/2015   CLINICAL DATA:  Acute respiratory failure with hypoxia, atelectasis, and CVA.  EXAM: PORTABLE CHEST - 1 VIEW  COMPARISON:  Portable chest x-ray of January 03, 2015  FINDINGS: There has been interval extubation of the trachea and of the esophagus. The lungs are adequately inflated. There is increased retrocardiac density on the left today. A small left pleural effusion persists. New right infrahilar subsegmental atelectasis is noted as well. The pulmonary interstitial markings are more conspicuous bilaterally as well. The heart is normal in size. The pulmonary vascularity is not engorged.  IMPRESSION: 1. Increased bibasilar atelectasis with increased interstitial edema elsewhere in both lungs. Stable small left pleural effusion. 2. Interval extubation of the trachea and esophagus.   Electronically Signed   By: Shanon Brow  [place] M.D.   On: 01/05/2015 08:01   Dg Chest Port 1 View  01/03/2015   CLINICAL DATA:  Status post intubation and OG tube placement.  EXAM: PORTABLE CHEST - 1 VIEW  COMPARISON:  None.  FINDINGS: Endotracheal tube is in place with the tip in good position at the level of the clavicular heads well above the carina. OG tube is also in good  position with the side-port in the stomach. There is a small left pleural effusion and mild basilar atelectasis. The right lung is clear. No pneumothorax identified. Heart size is normal.  IMPRESSION: ET tube and NG tube in good position.  Small left pleural effusion and basilar atelectasis.   Electronically Signed   By: Inge Rise M.D.   On: 01/03/2015 17:58    Labs:  CBC:  Recent Labs  02/02/14 1518 01/03/15 1419 01/03/15 1420 01/04/15 0500 01/05/15 0317  WBC 6.8  --  7.0 9.4 7.1  HGB 13.6 15.6* 13.6 11.2* 11.4*  HCT 40.3 46.0 40.5 33.0* 34.8*  PLT 235  --  210 190 152    COAGS:  Recent Labs  01/03/15 1420  INR 1.02  APTT 27    BMP:  Recent Labs  02/02/14 1518 01/03/15 1419 01/03/15 1420 01/04/15 0500 01/05/15 0317  NA 137 140 136 136 142  K 4.4 4.5 4.5 3.9 3.5  CL 99 102 102 106 111  CO2 27  --  24 24 22   GLUCOSE 100* 127* 126* 118* 100*  BUN 13 19 14 8  <5*  CALCIUM 9.5  --  9.2 7.8* 8.3*  CREATININE 0.87 0.80 0.88 0.79 0.80  GFRNONAA  --   --  >60 >60 >60  GFRAA  --   --  >60 >60 >60    LIVER FUNCTION TESTS:  Recent Labs  02/02/14 1518 01/03/15 1420 01/05/15 0317  BILITOT 0.9 1.0 1.4*  AST 32 51* 22  ALT 61* 93* 42  ALKPHOS 48 50 44  PROT 7.3 7.7 5.8*  ALBUMIN 4.4 4.0 2.9*    Assessment and Plan: Right MCA CVA S/p successful Rt common carotiod arteriogram,followed by complete revascularization of RT MCA prox M1 occlusion withx1 pass with Solitaire 25mm x 40 mm Fr retrieval device and 7 mg of superselective IA tpa achieving a TICI 3 revascularization Extubated, moving all extremities and doing well On ASA 325 mg daily, MRI Brain reviewed.  Will need 2 week F/U with Dr. Estanislado Pandy    Signed: Tsosie Billing D 01/05/2015, 9:56 AM   I spent a total of 15 Minutes in face to face in clinical consultation/evaluation, greater than 50% of which was counseling/coordinating care for right MCA CVA

## 2015-01-05 NOTE — Evaluation (Signed)
Physical Therapy Evaluation Patient Details Name: Rhonda Paul MRN: [id number] DOB: [birth year] Today's Date: 01/05/2015   History of Present Illness  Rhonda Paul is an 64 y.o. female with history of HTN, Afib taking ASA, breast cancer s/p right mastectomy+XRT+chemotherapy, and migraine.Initially on admission she had a right aze deviation and left flaccid paralysis.  MRI showed acute nonhemorrhagic infarcts involving the anterior right temporal lobe, anterior inferior right parietal lobe, anterior insular cortex, and posterior right lentiform nucleus.  Pt taken to IR and M1 was completed revascularized.  Clinical Impression  Pt is at or close to baseline functioning and should be safe at home with available assist of husband. There are no further acute PT needs.  Will sign off at this time.     Follow Up Recommendations No PT follow up    Equipment Recommendations  None recommended by PT    Recommendations for Other Services       Precautions / Restrictions Precautions Precautions: None Restrictions Weight Bearing Restrictions: No      Mobility  Bed Mobility Overal bed mobility: Independent                Transfers Overall transfer level: Independent                  Ambulation/Gait Ambulation/Gait assistance: Supervision Ambulation Distance (Feet): 240 Feet Assistive device: None Gait Pattern/deviations: Step-through pattern Gait velocity: functional Gait velocity interpretation: >2.62 ft/sec, indicative of independent community ambulator General Gait Details: steady gait including with scanning, abrupt turns, backing up, speed changes  Stairs            Wheelchair Mobility    Modified Rankin (Stroke Patients Only) Modified Rankin (Stroke Patients Only) Pre-Morbid Rankin Score: No symptoms Modified Rankin: No symptoms     Balance Overall balance assessment: Needs assistance Sitting-balance support: Feet supported;No upper extremity  supported Sitting balance-Leahy Scale: Good     Standing balance support: During functional activity;No upper extremity supported Standing balance-Leahy Scale: Good                               Pertinent Vitals/Pain Pain Assessment: No/denies pain    Home Living Family/patient expects to be discharged to:: Private residence Living Arrangements: Spouse/significant other Available Help at Discharge: Family Type of Home: House Home Access: Stairs to enter Entrance Stairs-Rails: Psychiatric nurse of Steps: several Home Layout: Able to live on main level with bedroom/bathroom;Two level;Other (Comment) (doesn't have to go to basement often)        Prior Function Level of Independence: Independent         Comments: still working     Journalist, newspaper        Extremity/Trunk Assessment   Upper Extremity Assessment: Defer to OT evaluation           Lower Extremity Assessment: Overall WFL for tasks assessed      Cervical / Trunk Assessment: Normal  Communication   Communication: No difficulties  Cognition Arousal/Alertness: Awake/alert Behavior During Therapy: WFL for tasks assessed/performed Overall Cognitive Status: Within Functional Limits for tasks assessed                      General Comments      Exercises        Assessment/Plan    PT Assessment Patent does not need any further PT services  PT Diagnosis     PT Problem List  PT Treatment Interventions     PT Goals (Current goals can be found in the Care Plan section) Acute Rehab PT Goals PT Goal Formulation: All assessment and education complete, DC therapy    Frequency     Barriers to discharge        Co-evaluation               End of Session   Activity Tolerance: Patient tolerated treatment well Patient left: in bed;with call bell/phone within reach;with family/visitor present Nurse Communication: Mobility status         Time:  3007-6226 PT Time Calculation (min) (ACUTE ONLY): 19 min   Charges:   PT Evaluation $Initial PT Evaluation Tier I: 1 Procedure     PT G Codes:        Cadance Raus, Tessie Fass 01/05/2015, 12:16 PM  01/05/2015  Donnella Sham, PT [phone]  (pager)

## 2015-01-05 NOTE — Consult Note (Signed)
Name: Rhonda Paul is a 64 y.o. female Admit date: 01/03/2015 Referring Physician:  Leonie Man, M.D. Primary Physician:  Oliver Pila, MD Primary Cardiologist:  Valli Glance Blenda Bridegroom, M.D.  Reason for Consultation:  Embolic CVA in setting of chronic A. fib  ASSESSMENT:  1. Chronic atrial fibrillation with rate control. For years we have discussed the eventual need for oral anticoagulation therapy but on our last office visit her CHADS2 VASC score was 1. Aspirin was continued. 2. 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 Treated successfully with TPA and mechanical thrombectomy!!!! Doristine Devoid Job) 3. Status post right breast cancer, status post resection and chemotherapy 4. Essential hypertension  PLAN:  1. Long-term anticoagulation. I have discussed this with the patient. I discussed the pros and cons of new oral anticoagulant therapy(which I favor - Eliquis) versus Coumadin. She is leaning towards Coumadin therapy because of cost and high deductible on her insurance plan. CHADS VASC is now 3. 2. I would recommend starting oral anticoagulation therapy as soon as felt safe. 3. Continue beta blocker therapy for rate control and blood pressure   HPI: 64 year old female who suddenly developed facial drooping, left flaccid paralysis, slurred speech, and right gaze deviation. Code stroke was activated with resultant administration of intravenous TPA, intra-arterial TPA and mechanical thrombectomy leading to a dramatic improvement. Today her neurological deficits have resolved. She is ambulated. She feels "back to normal" she has no other significant medical problems other then treated breast cancer, status post chemotherapy, and history of hypertension.  She has been reluctant to use anticoagulation in the past. We had long discussion concerning anticoagulation today. We discussed the pros and cons of new oral anticoagulation agents versus Coumadin therapy. She is leaning towards  Coumadin therapy because of coverage and financial concerns.  PMH:   Past Medical History  Diagnosis Date  . Vitamin D deficiency 07/2006  . Diverticulitis 2009    diverticulitis  . Adenocarcinoma of breast 06/03    RIGHT  . Amenorrhea 09/01  . Migraine 09/01  . Infertility, female 09/01    4 SAB  . STD (sexually transmitted disease)     Hx of HSV  . Amenorrhea   . Metrorrhagia   . Chemotherapy adverse reaction   . Hypertension   . Permanent atrial fibrillation 04/01/2014    PSH:   Past Surgical History  Procedure Laterality Date  . Colonoscopy  11/2012    diverticulitis, 2 polyps negative, repeat in 5 years  . Appendectomy  1959  . Breast surgery Right 11/2001    lumpectomy with sentinel node biopsy X 3 all negative, ER/PR negative , chemo 6 doses, and radiation daily X 6 weeks  . Radiology with anesthesia N/A 01/03/2015    Procedure: RADIOLOGY WITH ANESTHESIA;  Surgeon: Medication Radiologist, MD;  Location: [facility];  Service: Radiology;  Laterality: N/A;   Allergies:  Lambs quarters and Other Prior to Admit Meds:   Prescriptions prior to admission  Medication Sig Dispense Refill Last Dose  . aspirin 325 MG EC tablet Take 325 mg by mouth at bedtime.    01/02/2015 at Unknown time  . levothyroxine (SYNTHROID, LEVOTHROID) 50 MCG tablet TAKE 1 TABLET BY MOUTH EVERY DAY (Patient taking differently: Take 50 mcg by mouth at bedtime. ) 90 tablet 3 01/02/2015 at Unknown time  . metoprolol succinate (TOPROL-XL) 50 MG 24 hr tablet Take 1 and 1/2 tablet (75mg ) Daily 45 tablet 11 01/02/2015 at 1900  . estradiol (ESTRING) 2 MG vaginal ring Place 2  mg vaginally every 3 (three) months. 1 each 3 11/03/2014   Fam HX:    Family History  Problem Relation Age of Onset  . Colon cancer Mother   . Cervical cancer Mother   . Osteoporosis Mother   . Cancer Mother   . Heart disease Father   . Multiple births Maternal Grandmother   . Multiple births Paternal Grandmother   . Heart disease Brother   .  Breast cancer Maternal Aunt   . Cancer Maternal Aunt    Social HX:    History   Social History  . Marital Status: Married    Spouse Name: N/A  . Number of Children: N/A  . Years of Education: N/A   Occupational History  . Not on file.   Social History Main Topics  . Smoking status: Never Smoker   . Smokeless tobacco: Never Used     Comment: never used tobacco  . Alcohol Use: 0.0 oz/week    7-14 Glasses of wine per week  . Drug Use: No  . Sexual Activity: Yes    Birth Control/ Protection: Post-menopausal   Other Topics Concern  . Not on file   Social History Narrative     Review of Systems: Denies nausea, vomiting, weight loss, and probably neurological complaints.  Physical Exam: Blood pressure 132/75, pulse 81, temperature 98 F (36.7 C), temperature source Oral, resp. rate 22, height 5\' 6"  (1.676 m), weight 79 kg (174 lb 2.6 oz), last menstrual period 11/08/2001, SpO2 91 %. Weight change: -0.9 kg (-1 lb 15.8 oz)   The patient is in no acute distress. She is alert and conversant. HEENT exam reveals no ocular movement abnormalities or gaze preference. Neck exam reveals no carotid bruit. Cardiac exam reveals no murmur, rub, or gallop. Rhythm is irregularly irregular. Chest: Auscultation of percussion Extremities reveal no edema Abdomen is soft Neurological exam reveals no focal deficit` Labs: Lab Results  Component Value Date   WBC 7.1 01/05/2015   HGB 11.4* 01/05/2015   HCT 34.8* 01/05/2015   MCV 105.1* 01/05/2015   PLT 152 01/05/2015    Recent Labs Lab 01/05/15 0317  NA 142  K 3.5  CL 111  CO2 22  BUN <5*  CREATININE 0.80  CALCIUM 8.3*  PROT 5.8*  BILITOT 1.4*  ALKPHOS 44  ALT 42  AST 22  GLUCOSE 100*   No results found for: PTT Lab Results  Component Value Date   INR 1.02 01/03/2015   No results found for: CKTOTAL, CKMB, CKMBINDEX, TROPONINI   Lab Results  Component Value Date   CHOL 236* 01/04/2015   CHOL 236* 01/03/2014   Lab  Results  Component Value Date   HDL 61 01/04/2015   HDL 73 01/03/2014   Lab Results  Component Value Date   LDLCALC 142* 01/04/2015   LDLCALC 123* 01/03/2014   Lab Results  Component Value Date   TRIG 166* 01/04/2015   TRIG 151* 01/03/2015   TRIG 199* 01/03/2014   Lab Results  Component Value Date   CHOLHDL 3.9 01/04/2015   CHOLHDL 3.2 01/03/2014   No results found for: LDLDIRECT    Radiology:  Ct Head Wo Contrast  01/03/2015   CLINICAL DATA:  Status post revascularization of the right middle cerebral artery. Code stroke. Acute onset of slurred speech, right gaze deviation and left sided paralysis.  EXAM: CT HEAD WITHOUT CONTRAST  TECHNIQUE: Contiguous axial images were obtained from the base of the skull through the vertex without intravenous contrast.  COMPARISON:  CT head without contrast from the same day.  FINDINGS: No significant hemorrhage is present. Basal ganglia are intact. The decreased density the anterior right insular cortex persists. No additional cortical defects are evident. Mild periventricular white matter hypoattenuation is stable.  The ventricles are of normal size. No significant extra-axial fluid collection is present.  Fluid is present in the nasopharynx. The patient is intubated. Mild mucosal thickening is present in the anterior ethmoid air cells and inferior frontal sinuses. The mastoid air cells are clear. The calvarium is intact. The globes and orbits are within normal limits.  IMPRESSION: 1. Stable slight loss of density in the anterior right insular cortex. 2. No evidence for progression of infarct following revascularization of the right middle cerebral artery. 3. No acute hemorrhage.   Electronically Signed   By: San Morelle M.D.   On: 01/03/2015 16:57   Ct Head Wo Contrast  01/03/2015   CLINICAL DATA:  Left-sided weakness with slurred speech  EXAM: CT HEAD WITHOUT CONTRAST  TECHNIQUE: Contiguous axial images were obtained from the base of the  skull through the vertex without intravenous contrast.  COMPARISON:  April 18, 2009  FINDINGS: The ventricles are normal in size and configuration. There is no intracranial mass, hemorrhage, extra-axial fluid collection or midline shift. There is diffuse increased attenuation in the right middle cerebral artery, concerning for thrombosis in this vessel/early right middle cerebral artery dystrophy new shin infarct. There is subtle loss of detail of the right insula, concerning for early edema in this area. Elsewhere gray-white compartments are normal. Bony calvarium appears intact. The mastoid air cells are clear.  IMPRESSION: Increased attenuation in the right middle cerebral artery concerning for earliest changes of infarct/thrombosis in this region. Subtle loss of right insular cortex detail, and other finding suggesting early right middle cerebral artery distribution infarct. Elsewhere gray-white compartments appear within normal limits. No hemorrhage or mass effect.  Critical Value/emergent results were called by telephone at the time of interpretation on 01/03/2015 at 2:31 pm to Dr. Aram Beecham, neurology, who verbally acknowledged these results.   Electronically Signed   By: Lowella Grip III M.D.   On: 01/03/2015 14:32   Mr Brain Wo Contrast  01/04/2015   CLINICAL DATA:  Right-sided CVA. Revascularization of the right middle cerebral artery yesterday. The patient remains intubated but as purposeful movements on the left.  EXAM: MRI HEAD WITHOUT CONTRAST  TECHNIQUE: Multiplanar, multiecho pulse sequences of the brain and surrounding structures were obtained without intravenous contrast.  COMPARISON:  CT head from 01/03/2015.  FINDINGS: The diffusion-weighted images confirm an acute nonhemorrhagic infarct within the anterior insular cortex. An acute nonhemorrhagic infarct is evident in the posterior right lentiform nucleus as well. Acute nonhemorrhagic cortical infarct is present within the anterior right  temporal lobe extending to the temporal tip. More subtle acute nonhemorrhagic infarct is present in the posterior right temporal and anterior inferior right parietal lobe.  T2 and FLAIR signal changes are associated with the areas of acute infarct. There is no evidence for acute or chronic hemorrhage. Focal T2 changes are present within the left parietal lobe suggesting a more remote cortical infarct. No acute infarct is present on the left.  Slight signal abnormalities are present in the distal right M1 segment suggesting slow flow. There is flow in the MCA branch vessels. Flow is present in the major intracranial arteries otherwise.  The patient is intubated. Fluid is present within the nasopharynx. Mild mucosal thickening is present throughout the ethmoid air  cells and frontal sinuses. There is a small fluid level in the left sphenoid sinus. A small amount of mastoid fluid is present bilaterally as well.  IMPRESSION: 1. Acute nonhemorrhagic infarcts involving the anterior right temporal lobe, anterior inferior right parietal lobe, anterior insular cortex, and posterior right lentiform nucleus. 2. Focal remote left parietal lobe infarct. 3. Extensive sinus disease with fluid in the nasopharynx. The symptoms are all likely related to the presence of the endotracheal tube. 4. Minimal signal abnormality is within the distal right M1 segment suggesting slow flow. There is no obstruction evident on today's study.   Electronically Signed   By: San Morelle M.D.   On: 01/04/2015 16:02   Dg Chest Port 1 View  01/05/2015   CLINICAL DATA:  Acute respiratory failure with hypoxia, atelectasis, and CVA.  EXAM: PORTABLE CHEST - 1 VIEW  COMPARISON:  Portable chest x-ray of January 03, 2015  FINDINGS: There has been interval extubation of the trachea and of the esophagus. The lungs are adequately inflated. There is increased retrocardiac density on the left today. A small left pleural effusion persists. New right  infrahilar subsegmental atelectasis is noted as well. The pulmonary interstitial markings are more conspicuous bilaterally as well. The heart is normal in size. The pulmonary vascularity is not engorged.  IMPRESSION: 1. Increased bibasilar atelectasis with increased interstitial edema elsewhere in both lungs. Stable small left pleural effusion. 2. Interval extubation of the trachea and esophagus.   Electronically Signed   By: David  Martinique M.D.   On: 01/05/2015 08:01   Dg Chest Port 1 View  01/03/2015   CLINICAL DATA:  Status post intubation and OG tube placement.  EXAM: PORTABLE CHEST - 1 VIEW  COMPARISON:  None.  FINDINGS: Endotracheal tube is in place with the tip in good position at the level of the clavicular heads well above the carina. OG tube is also in good position with the side-port in the stomach. There is a small left pleural effusion and mild basilar atelectasis. The right lung is clear. No pneumothorax identified. Heart size is normal.  IMPRESSION: ET tube and NG tube in good position.  Small left pleural effusion and basilar atelectasis.   Electronically Signed   By: Inge Rise M.D.   On: 01/03/2015 17:58    EKG:  On admission revealed atrial fibrillation with controlled ventricular response. No acute changes compared to prior studies  ECHOCARDIOGRAM: 04/05/14 Study Conclusions  - Left ventricle: The cavity size was normal. Wall thickness was normal. Systolic function was normal. The estimated ejection fraction was in the range of 55% to 60%. Wall motion was normal; there were no regional wall motion abnormalities. - Mitral valve: There was moderate regurgitation. - Left atrium: The atrium was moderately dilated. - Pulmonary arteries: Systolic pressure was mildly increased. PA peak pressure: 37 mm Hg (S).   Sinclair Grooms 01/05/2015 1:54 PM

## 2015-01-05 NOTE — Progress Notes (Signed)
PULMONARY / CRITICAL CARE MEDICINE   Name: Rhonda Paul MRN: [id number] DOB: 12-20-1950    ADMISSION DATE:  01/03/2015 CONSULTATION DATE:  01/03/15  REFERRING MD :  Dr. Leonie Man   CHIEF COMPLAINT:  AMS  INITIAL PRESENTATION:   64 y/o F, non-smoker, with PMH of adenocarcinoma of the breast (2003) s/p chemotherapy, migraines, HTN, permanent atrial fibrillation (not on anticoagulation, ASA only + lopressor) who presented to [facility] on 7/26 with acute R MCA stroke. The patient was taken emergently to neuro-interventional radiology for potential revascularization. PCCM consulted for post-operative ventilator management.    STUDIES:  7/26  CT Head >> increased attenuation in the R MCA concerning for infarct/thrombosis in this region, subtle loss of R insular cortex detail suggestive of early R MCA CVA  SIGNIFICANT EVENTS: 7/26  Admit with L sided weakness. Work up concerning for R MCA infarct.  To neuro IR.  7/26: S/P rt common carotiod arteriogram,followed by complete revascularization of RT MCA prox M1 occlusion withx1 pass with Solitaire 79mm x 40 mm Fr retrieval device and 7 mg of superselective IA tpa achieving a TICI 3 revascularization        VITAL SIGNS: Temp:  [98 F (36.7 C)-98.6 F (37 C)] 98 F (36.7 C) (07/28 0801) Pulse Rate:  [68-87] 81 (07/28 1300) Resp:  [14-24] 22 (07/28 1300) BP: (96-143)/(53-86) 132/75 mmHg (07/28 1300) SpO2:  [91 %-100 %] 91 % (07/28 1300) Arterial Line BP: (88-145)/(80-111) 139/80 mmHg (07/28 1000) FiO2 (%):  [40 %] 40 % (07/28 0800) Weight:  [79 kg (174 lb 2.6 oz)] 79 kg (174 lb 2.6 oz) (07/28 0500)   HEMODYNAMICS:     VENTILATOR SETTINGS: Vent Mode:  [-]  FiO2 (%):  [40 %] 40 %   INTAKE / OUTPUT:  Intake/Output Summary (Last 24 hours) at 01/05/15 1351 Last data filed at 01/05/15 1300  Gross per 24 hour  Intake   2044 ml  Output   1045 ml  Net    999 ml   PHYSICAL EXAMINATION: General:  Overweight female extubated and doing  well. Neuro:  Alert and interactive, moving all ext to command. HEENT:  [place]/AT, no JVD noted Cardiovascular:  IRIR, No MRG Lungs:  Coarse L > R Abdomen:  Soft, non-tender, non-distended Musculoskeletal:  No acute deformity or edema Skin:  Grossly intact  LABS:  CBC  Recent Labs Lab 01/03/15 1420 01/04/15 0500 01/05/15 0317  WBC 7.0 9.4 7.1  HGB 13.6 11.2* 11.4*  HCT 40.5 33.0* 34.8*  PLT 210 190 152   Coag's  Recent Labs Lab 01/03/15 1420  APTT 27  INR 1.02   BMET  Recent Labs Lab 01/03/15 1420 01/04/15 0500 01/05/15 0317  NA 136 136 142  K 4.5 3.9 3.5  CL 102 106 111  CO2 24 24 22   BUN 14 8 <5*  CREATININE 0.88 0.79 0.80  GLUCOSE 126* 118* 100*   Electrolytes  Recent Labs Lab 01/03/15 1420 01/04/15 0500 01/05/15 0317  CALCIUM 9.2 7.8* 8.3*   Sepsis Markers No results for input(s): LATICACIDVEN, PROCALCITON, O2SATVEN in the last 168 hours.   ABG  Recent Labs Lab 01/03/15 1800  PHART 7.360  PCO2ART 40.2  PO2ART 135*     Liver Enzymes  Recent Labs Lab 01/03/15 1420 01/05/15 0317  AST 51* 22  ALT 93* 42  ALKPHOS 50 44  BILITOT 1.0 1.4*  ALBUMIN 4.0 2.9*   Cardiac Enzymes No results for input(s): TROPONINI, PROBNP in the last 168 hours.   Glucose  Recent Labs Lab 01/04/15 01/04/15 0423 01/04/15 0816 01/04/15 1147 01/04/15 2139 01/05/15 0759  GLUCAP 80 89 98 95 104* 29    Imaging Mr Brain Wo Contrast  01/04/2015   CLINICAL DATA:  Right-sided CVA. Revascularization of the right middle cerebral artery yesterday. The patient remains intubated but as purposeful movements on the left.  EXAM: MRI HEAD WITHOUT CONTRAST  TECHNIQUE: Multiplanar, multiecho pulse sequences of the brain and surrounding structures were obtained without intravenous contrast.  COMPARISON:  CT head from 01/03/2015.  FINDINGS: The diffusion-weighted images confirm an acute nonhemorrhagic infarct within the anterior insular cortex. An acute nonhemorrhagic  infarct is evident in the posterior right lentiform nucleus as well. Acute nonhemorrhagic cortical infarct is present within the anterior right temporal lobe extending to the temporal tip. More subtle acute nonhemorrhagic infarct is present in the posterior right temporal and anterior inferior right parietal lobe.  T2 and FLAIR signal changes are associated with the areas of acute infarct. There is no evidence for acute or chronic hemorrhage. Focal T2 changes are present within the left parietal lobe suggesting a more remote cortical infarct. No acute infarct is present on the left.  Slight signal abnormalities are present in the distal right M1 segment suggesting slow flow. There is flow in the MCA branch vessels. Flow is present in the major intracranial arteries otherwise.  The patient is intubated. Fluid is present within the nasopharynx. Mild mucosal thickening is present throughout the ethmoid air cells and frontal sinuses. There is a small fluid level in the left sphenoid sinus. A small amount of mastoid fluid is present bilaterally as well.  IMPRESSION: 1. Acute nonhemorrhagic infarcts involving the anterior right temporal lobe, anterior inferior right parietal lobe, anterior insular cortex, and posterior right lentiform nucleus. 2. Focal remote left parietal lobe infarct. 3. Extensive sinus disease with fluid in the nasopharynx. The symptoms are all likely related to the presence of the endotracheal tube. 4. Minimal signal abnormality is within the distal right M1 segment suggesting slow flow. There is no obstruction evident on today's study.   Electronically Signed   By: San Morelle M.D.   On: 01/04/2015 16:02   Dg Chest Port 1 View  01/05/2015   CLINICAL DATA:  Acute respiratory failure with hypoxia, atelectasis, and CVA.  EXAM: PORTABLE CHEST - 1 VIEW  COMPARISON:  Portable chest x-ray of January 03, 2015  FINDINGS: There has been interval extubation of the trachea and of the esophagus. The  lungs are adequately inflated. There is increased retrocardiac density on the left today. A small left pleural effusion persists. New right infrahilar subsegmental atelectasis is noted as well. The pulmonary interstitial markings are more conspicuous bilaterally as well. The heart is normal in size. The pulmonary vascularity is not engorged.  IMPRESSION: 1. Increased bibasilar atelectasis with increased interstitial edema elsewhere in both lungs. Stable small left pleural effusion. 2. Interval extubation of the trachea and esophagus.   Electronically Signed   By: David  Martinique M.D.   On: 01/05/2015 08:01   ASSESSMENT / PLAN:  NEUROLOGIC A:   R MCA CVA - s/p tpa, neuro-intervention  P:   RASS goal: -1 Propofol for sedation Neuro checks per protocol Stroke team following  PULMONARY OETT 7/26 >>  A: Acute Respiratory Failure - in setting of neurologic dysfunction / CVA  > appears ready for extubation  P:   Titrate O2 to off, was not on home O2. Pulm hygiene.  CARDIOVASCULAR A:  HTN  Permanent Atrial Fibrillation -  Mali Vasc score 1 as of last cardiology OV, aspirin only.  P:  Blood pressure parameters per Neuro. ICU monitoring of hemodynamics. Assess ECHO, carotid dopplers per neuro. D/C Neo. D/C Nicardipine.  RENAL A:   No acute issues  P:   Trend BMP / UOP   GASTROINTESTINAL A:   Vent associated Dysphagia  P:   Diet as ordered. D/Ced OGT on 7/27. Pepcid for SUP  HEMATOLOGIC A:   Macrocytosis P:  Trend CBC  DVT ppx:  Per stroke team  INFECTIOUS A:   No acute infectious process  P:   Monitor fever curve / leukocytosis   ENDOCRINE A:   Hyperglycemia    P:   CBG monitoring and SSI   FAMILY  - Updates: Patient updated bedside.  - Inter-disciplinary family meet or Palliative Care meeting due by:  01/10/2015  PCCM will sign off, please call back if needed.  Rush Farmer, M.D. [facility] Pulmonary/Critical Care Medicine. Pager: [phone]. After hours  pager: [phone].

## 2015-01-06 DIAGNOSIS — I639 Cerebral infarction, unspecified: Secondary | ICD-10-CM

## 2015-01-06 DIAGNOSIS — E785 Hyperlipidemia, unspecified: Secondary | ICD-10-CM

## 2015-01-06 MED ORDER — PANTOPRAZOLE SODIUM 40 MG PO TBEC: 40.0000 mg | DELAYED_RELEASE_TABLET | Freq: Every day | ORAL | Status: DC

## 2015-01-06 MED ORDER — ATORVASTATIN CALCIUM 40 MG PO TABS: 40.0000 mg | ORAL_TABLET | Freq: Every day | ORAL | Status: DC

## 2015-01-06 MED ORDER — WARFARIN - PHARMACIST DOSING INPATIENT: Freq: Every day | Status: DC

## 2015-01-06 MED ORDER — APIXABAN 5 MG PO TABS: 5.0000 mg | ORAL_TABLET | Freq: Two times a day (BID) | ORAL | Status: DC

## 2015-01-06 MED ORDER — WARFARIN VIDEO: Freq: Once | Status: DC

## 2015-01-06 MED ORDER — COUMADIN BOOK
Freq: Once | Status: DC
  Filled 2015-01-06: qty 1

## 2015-01-06 MED ORDER — WARFARIN SODIUM 5 MG PO TABS: 5.0000 mg | ORAL_TABLET | Freq: Once | ORAL | Status: DC

## 2015-01-06 NOTE — Care Management Note (Signed)
Case Management Note  Patient Details  Name: Rhonda Paul MRN: [id number] Date of Birth: 08-28-1950  Subjective/Objective:                    Action/Plan: Benefits check completed for Eliquis: ELIQUIS is covered. No Prior Authorization required. Patient's co-payment will be 30% of the medication's total cost. Preferred retail pharmacies are: [facility], [facility], and [facility]. CM spoke with Burnetta Sabin, NP with stroke team, who states that Eliquis is the preferred anticoagulant.  Patient had shared concerns regarding prescription copay. CM met with patient to provide Eliquis 30 day free card and copay assistance card. Plan is to discharge home on Eliquis. Bedside RN updated.  Expected Discharge Date:                  Expected Discharge Plan:  Home/Self Care  In-House Referral:     Discharge planning Services  CM Consult  Post Acute Care Choice:    Choice offered to:     DME Arranged:    DME Agency:     HH Arranged:    [place] Agency:     Status of Service:  Completed, signed off  Medicare Important Message Given:    Date Medicare IM Given:    Medicare IM give by:    Date Additional Medicare IM Given:    Additional Medicare Important Message give by:     If discussed at [place] of Stay Meetings, dates discussed:    Additional Comments:  Rolm Baptise, RN 01/06/2015, 2:38 PM

## 2015-01-06 NOTE — Progress Notes (Addendum)
Full cardiology consultation done yesterday. Nothing new to add this morning.  Daryel November, MD

## 2015-01-06 NOTE — Discharge Summary (Addendum)
Physician Discharge Summary  Patient ID: NA WALDRIP MRN: [id number] DOB/AGE: [birth year] 64 y.o.  Admit date: 01/03/2015 Discharge date: 01/06/2015  Admission Diagnoses: Code Stroke  Discharge Diagnoses: Embolic right MCA infarct secondary to atrial fibrillation treated with IV TPA and mechanical embolectomy with solitaire device and 7 mg IA TPA with complete  TICI 3 recanalization and excellent clinical recovery. Active Problems:   CVA (cerebral infarction)   Atelectasis   Stroke   Acute respiratory failure with hypoxemia   Hyperlipemia Chronic Atrial Fibrillation  Discharged Condition: good  Hospital Course: Rhonda Paul is an 64 y.o. female with history of HTN, Afib taking ASA, breast cancer s/p right mastectomy+XRT+chemotherapy, and migraine. Today 01/03/2015 she had gone out to lunch and when she returned to work it was noted she was slurring her words (LKW 01/03/2015 at 1320). EMS was called and on arrival she had a right gaze deviation and left flaccid paralysis. On arrival to ED she no longer had a right gaze preference but continued to show a left hemiplegia and left field cut. Complains of HA that has been present since earlier today but denies vertigo, double vision, difficulty swallowing, or visual disturbances. Modified Rankin: Rankin Score=0. NIHSS 10. CT head was reviewed and showed subtle loss of right insular cortex (ASPECTS 9) as well as hyperdense right MCA sign. IV tPA was given at 1434 and IR was consulted. Pt had complete revascularization of RT MCA prox M1 occlusion with x1 pass with Solitaire and 7 mg of IA tpa achieving a TICI 3 revascularization. She was admitted to the neuro ICU for further evaluation and treatment. She did well and showed significant left-sided weakness. She was extubated after groin sheath removal. On exam she had no significant deficits and-stroke scale improved from 10 on admission  To 0. Transthoracic echo showed normal ejection fraction.  Cardiac monitoring showed atrial fibrillation. LDL cholesterol was elevated at 142 and she was started on atorvastatin 40 mg for that. Hemoglobin A1c was 6.0. Patient was seen by physical occupational speech therapy and felt not to need any therapies at home. She was started on eliquis for anticoagulation. She initially expressed concerns about the cost of co-pay but after discussion with social worker case manager she was given a coupon to help with her co-pay after it was first determined that eliquis was covered by her insurance. She was seen on consultation by Dr. Daneen Schick cardiology and she was asked to follow-up with him next month. MRI scan of the brain showed Acute nonhemorrhagic infarcts involving the anterior right temporal lobe, anterior inferior right parietal lobe, anterior insular cortex, and posterior right lentiform nucleus PHYSICAL EXAM Pleasant middle-aged Caucasian lady . Afebrile. Head is nontraumatic. Neck is supple without bruit. Cardiac exam no murmur or gallop. Lungs are clear to auscultation. Distal pulses are well felt. Neurological Exam : Awake alert oriented 3 with normal speech and language function. Follows commands well. Blinks to threat bilaterally. Pupils equal reactive. Fundi were not visualized. No facial weakness. Tongue midline. No upper extremity drift. Diminished fine finger movements on the left. Orbits right over left upper extremity. Minimum left grip weakness. Good purposeful antigravity movements on the right side. Sensation and coordination normal . Left plantar equivocal right downgoing. Gait cannot be tested NIH stroke scale score 0  PHYSICAL EXAM Pleasant middle-aged Caucasian lady . Afebrile. Head is nontraumatic. Neck is supple without bruit. Cardiac exam no murmur or gallop. Lungs are clear to auscultation. Distal pulses are well felt. Neurological Exam :  Awake alert oriented 3 with normal speech and language function. Follows commands well.  Blinks to threat bilaterally. Pupils equal reactive. Fundi were not visualized. No facial weakness. Tongue midline. No upper extremity drift. Diminished fine finger movements on the left. Orbits right over left upper extremity. Minimum left grip weakness. Good purposeful antigravity movements on the right side. Sensation and coordination normal . Left plantar equivocal right downgoing. Gait cannot be tested NIH stroke scale score 0   Consults: Cardiology, Critical Care Medicine and  Neurointerventional Radiology   Treatments:IV TPA followed by complete revascularization of RT MCA prox M1 occlusion withx1 pass with Solitaire 33mm x 40 mm Fr retrieval device and 7 mg of superselective IA tpa achieving a TICI 3 revascularization  Discharge Exam: Blood pressure 117/73, pulse 88, temperature 98.8 F (37.1 C), temperature source Oral, resp. rate 18, height 5\' 6"  (1.676 m), weight 116 lb 6.5 oz (52.8 kg), last menstrual period 11/08/2001, SpO2 93 %.  PHYSICAL EXAM Pleasant middle-aged Caucasian lady . Afebrile. Head is nontraumatic. Neck is supple without bruit. Cardiac exam no murmur or gallop. Lungs are clear to auscultation. Distal pulses are well felt. Neurological Exam : Awake alert oriented 3 with normal speech and language function. Follows commands well. Blinks to threat bilaterally. Pupils equal reactive. Fundi were not visualized. No facial weakness. Tongue midline. No upper extremity drift. Diminished fine finger movements on the left. Orbits right over left upper extremity. Minimum left grip weakness. Good purposeful antigravity movements on the right side. Sensation and coordination normal . Left plantar equivocal right downgoing. Gait cannot be tested NIH stroke scale score 0  Disposition:   Discharge Instructions    Diet - low sodium heart healthy    Complete by:  As directed      Discharge instructions    Complete by:  As directed   F/u Dr Leonie Man in stroke clinic in 2  months F/U Dr Daneen Schick next month . Call his office to schedule     Increase activity slowly    Complete by:  As directed             Medication List    STOP taking these medications        aspirin 325 MG EC tablet      TAKE these medications        apixaban 5 MG Tabs tablet  Commonly known as:  ELIQUIS  Take 1 tablet (5 mg total) by mouth 2 (two) times daily.     atorvastatin 40 MG tablet  Commonly known as:  LIPITOR  Take 1 tablet (40 mg total) by mouth daily at 6 PM.     estradiol 2 MG vaginal ring  Commonly known as:  ESTRING  Place 2 mg vaginally every 3 (three) months.     levothyroxine 50 MCG tablet  Commonly known as:  SYNTHROID, LEVOTHROID  TAKE 1 TABLET BY MOUTH EVERY DAY     metoprolol succinate 50 MG 24 hr tablet  Commonly known as:  TOPROL-XL  Take 1 and 1/2 tablet (75mg ) Daily           Follow-up Information    Follow up with DEVESHWAR, Fritz Pickerel, MD In 2 weeks.   Specialty:  Interventional Radiology   Why:  Our office will call with appointment [phone]   Contact information:   [address] [place] [address] [phone]       Follow up with SETHI,PRAMOD, MD In 2 months.   Specialties:  Neurology, Radiology   Why:  stroke clinic, office will call you for follow up appointment, call earlier if symptoms worsen or new neurologica   Contact information:   [address] [place] [address] [phone]       Follow up with Sinclair Grooms, MD. Call in 1 month.   Specialty:  Cardiology   Contact information:   [address] [address] [phone]      Total time spent on discharge summary 35 minutes  Signed: SETHI,PRAMOD 01/06/2015, 3:14 PM

## 2015-01-06 NOTE — Progress Notes (Signed)
Discharge orders received. Pt and spouse educated on instructions and stroke education. Pt verbalized understanding. Pt given discharge packet and prescriptions. IVs removed. [facility] consent obtained and order placed. Pt taken downstairs by staff via wheelchair.

## 2015-01-06 NOTE — Progress Notes (Signed)
STROKE TEAM PROGRESS NOTE   HISTORY Rhonda Paul is an 64 y.o. female with history of HTN, Afib taking ASA, breast cancer s/p right mastectomy+XRT+chemotherapy, and migraine. Today 01/03/2015 she had gone out to lunch and when she returned to work it was noted she was slurring her words (LKW 01/03/2015 at 1320). EMS was called and on arrival she had a right gaze deviation and left flaccid paralysis. On arrival to ED she no longer had a right gaze preference but continued to show a left hemiplegia and left field cut. Complains of HA that has been present since earlier today but denies vertigo, double vision, difficulty swallowing, or visual disturbances. Modified Rankin: Rankin Score=0. NIHSS 10. CT head was reviewed and showed subtle loss of right insular cortex (ASPECTS 9) as well as hyperdense right MCA sign. IV tPA was given at 1434 and IR was consulted. Pt had complete revascularization of RT MCA prox M1 occlusion with x1 pass with Solitaire and 7 mg of IA tpa achieving a TICI 3 revascularization. She was admitted to the neuro ICU for further evaluation and treatment.   SUBJECTIVE (INTERVAL HISTORY) Her  husband is at the bedside.  Overall she feels her condition is stable. . She appears to have made a full neurological recovery with no deficits. She remains in atrial fibrillation/flutter.appreciate Dr Smith`s consult. Patient leaning towards long-term anticoagulation with warfarin due to high co-pay with newer anticoagulants. OBJECTIVE Temp:  [98.3 F (36.8 C)-99.7 F (37.6 C)] 99.7 F (37.6 C) (07/29 0923) Pulse Rate:  [76-98] 98 (07/29 0923) Cardiac Rhythm:  [-]  Resp:  [18-20] 18 (07/29 0923) BP: (114-130)/(52-79) 128/73 mmHg (07/29 0923) SpO2:  [90 %-93 %] 91 % (07/29 0923) Weight:  [116 lb 6.5 oz (52.8 kg)] 116 lb 6.5 oz (52.8 kg) (07/29 0500)   Recent Labs Lab 01/04/15 0423 01/04/15 0816 01/04/15 1147 01/04/15 2139 01/05/15 0759  GLUCAP 89 98 95 104* 76    Recent  Labs Lab 01/03/15 1419 01/03/15 1420 01/04/15 0500 01/05/15 0317  NA 140 136 136 142  K 4.5 4.5 3.9 3.5  CL 102 102 106 111  CO2  --  24 24 22   GLUCOSE 127* 126* 118* 100*  BUN 19 14 8  <5*  CREATININE 0.80 0.88 0.79 0.80  CALCIUM  --  9.2 7.8* 8.3*    Recent Labs Lab 01/03/15 1420 01/05/15 0317  AST 51* 22  ALT 93* 42  ALKPHOS 50 44  BILITOT 1.0 1.4*  PROT 7.7 5.8*  ALBUMIN 4.0 2.9*    Recent Labs Lab 01/03/15 1419 01/03/15 1420 01/04/15 0500 01/05/15 0317  WBC  --  7.0 9.4 7.1  NEUTROABS  --  3.7 6.9  --   HGB 15.6* 13.6 11.2* 11.4*  HCT 46.0 40.5 33.0* 34.8*  MCV  --  103.3* 101.5* 105.1*  PLT  --  210 190 152   No results for input(s): CKTOTAL, CKMB, CKMBINDEX, TROPONINI in the last 168 hours.  Recent Labs  01/03/15 1420  LABPROT 13.6  INR 1.02    Recent Labs  01/03/15 1433  COLORURINE YELLOW  LABSPEC 1.024  PHURINE 6.5  GLUCOSEU NEGATIVE  HGBUR NEGATIVE  BILIRUBINUR NEGATIVE  KETONESUR NEGATIVE  PROTEINUR NEGATIVE  UROBILINOGEN 0.2  NITRITE NEGATIVE  LEUKOCYTESUR NEGATIVE       Component Value Date/Time   CHOL 236* 01/04/2015 0500   TRIG 166* 01/04/2015 0500   HDL 61 01/04/2015 0500   CHOLHDL 3.9 01/04/2015 0500   VLDL 33 01/04/2015 0500  LDLCALC 142* 01/04/2015 0500   Lab Results  Component Value Date   HGBA1C 6.0* 01/04/2015      Component Value Date/Time   LABOPIA NONE DETECTED 01/03/2015 1433   COCAINSCRNUR NONE DETECTED 01/03/2015 1433   LABBENZ NONE DETECTED 01/03/2015 1433   AMPHETMU NONE DETECTED 01/03/2015 1433   THCU NONE DETECTED 01/03/2015 1433   LABBARB NONE DETECTED 01/03/2015 1433     Recent Labs Lab 01/03/15 1420  ETH <5    Mr Brain Wo Contrast  01/04/2015   CLINICAL DATA:  Right-sided CVA. Revascularization of the right middle cerebral artery yesterday. The patient remains intubated but as purposeful movements on the left.  EXAM: MRI HEAD WITHOUT CONTRAST  TECHNIQUE: Multiplanar, multiecho pulse  sequences of the brain and surrounding structures were obtained without intravenous contrast.  COMPARISON:  CT head from 01/03/2015.  FINDINGS: The diffusion-weighted images confirm an acute nonhemorrhagic infarct within the anterior insular cortex. An acute nonhemorrhagic infarct is evident in the posterior right lentiform nucleus as well. Acute nonhemorrhagic cortical infarct is present within the anterior right temporal lobe extending to the temporal tip. More subtle acute nonhemorrhagic infarct is present in the posterior right temporal and anterior inferior right parietal lobe.  T2 and FLAIR signal changes are associated with the areas of acute infarct. There is no evidence for acute or chronic hemorrhage. Focal T2 changes are present within the left parietal lobe suggesting a more remote cortical infarct. No acute infarct is present on the left.  Slight signal abnormalities are present in the distal right M1 segment suggesting slow flow. There is flow in the MCA branch vessels. Flow is present in the major intracranial arteries otherwise.  The patient is intubated. Fluid is present within the nasopharynx. Mild mucosal thickening is present throughout the ethmoid air cells and frontal sinuses. There is a small fluid level in the left sphenoid sinus. A small amount of mastoid fluid is present bilaterally as well.  IMPRESSION: 1. Acute nonhemorrhagic infarcts involving the anterior right temporal lobe, anterior inferior right parietal lobe, anterior insular cortex, and posterior right lentiform nucleus. 2. Focal remote left parietal lobe infarct. 3. Extensive sinus disease with fluid in the nasopharynx. The symptoms are all likely related to the presence of the endotracheal tube. 4. Minimal signal abnormality is within the distal right M1 segment suggesting slow flow. There is no obstruction evident on today's study.   Electronically Signed   By: San Morelle M.D.   On: 01/04/2015 16:02   Dg Chest Port  1 View  01/05/2015   CLINICAL DATA:  Acute respiratory failure with hypoxia, atelectasis, and CVA.  EXAM: PORTABLE CHEST - 1 VIEW  COMPARISON:  Portable chest x-ray of January 03, 2015  FINDINGS: There has been interval extubation of the trachea and of the esophagus. The lungs are adequately inflated. There is increased retrocardiac density on the left today. A small left pleural effusion persists. New right infrahilar subsegmental atelectasis is noted as well. The pulmonary interstitial markings are more conspicuous bilaterally as well. The heart is normal in size. The pulmonary vascularity is not engorged.  IMPRESSION: 1. Increased bibasilar atelectasis with increased interstitial edema elsewhere in both lungs. Stable small left pleural effusion. 2. Interval extubation of the trachea and esophagus.   Electronically Signed   By: David  Martinique M.D.   On: 01/05/2015 08:01   Cerebral angiogram 7/26/216 S/P rt common carotiod arteriogram,followed by complete revascularization of RT MCA prox M1 occlusion withx1 pass with Solitaire 8mm x 40  mm Fr retrieval device and 7 mg of superselective IA tpa achieving a TICI 3 revascularization   PHYSICAL EXAM Pleasant middle-aged Caucasian lady . Afebrile. Head is nontraumatic. Neck is supple without bruit.    Cardiac exam no murmur or gallop. Lungs are clear to auscultation. Distal pulses are well felt. Neurological Exam :  Awake alert oriented 3 with normal speech and language function. Follows commands well. Blinks to threat bilaterally. Pupils equal reactive. Fundi were not visualized. No facial weakness. Tongue midline. No upper extremity drift. Diminished fine finger movements on the left. Orbits right over left upper extremity. Minimum left grip weakness. Good purposeful antigravity movements on the right side. Sensation and coordination normal . Left plantar equivocal right downgoing. Gait cannot be tested  NIH  stroke scale score 0  ASSESSMENT/PLAN Ms. DORRI OZTURK is a 64 y.o. female with history of hypertension, atrial fibrillation on ASA, R mastectomy+XRT+chemo and migraine presenting with slurring her words, right gaze deviation and left flaccid paralysis. She IV alteplase at 1434, s/p complete revascularization of proximal R M1 with Solitaire and IA alteplase.   Stroke:  Non-dominant right brain infarct s/p IV tPA and complete revascularization prox R M1 w/ IA tPA and mechanical thrombectomy. Stroke embolic secondary to known atrial fibrillation. Patient has made a near complete recovery  Sheath remains in place post IR  ]Resultant Minimalleft-sided weakness  Cerebral angio prox R M1 occlusion MRI  . Acute nonhemorrhagic infarcts involving the anterior right temporal lobe, anterior inferior right parietal lobe, anterior  insular cortex, and posterior right lentiform nucleus   2D Echo  Left ventricle: The cavity size was normal. Wall thickness was normal. The estimated ejection fraction was 55%. Wall motion was normal; there were no regional wall motion abnormalities. Indeterminant diastolic function (atrial fibrillation).  LDL 142  HgbA1c 6.0  SCDs for VTE prophylaxis Diet Heart Room service appropriate?: Yes; Fluid consistency:: Thin  aspirin 325 mg orally every day prior to admission, now on no antithrombotic post intervention  D/c sheath today  Ongoing aggressive stroke risk factor management  Therapy recommendations:  none Disposition:  home Respiratory Failure  Intubated for neurointervention  Requiring sedation this as fighting vent  Review imaging prior to extubating  CCM following  Atrial Fibrillation  Home anticoagulation:  None, documented  ChadsVasc2 of 1 PTA. Given stroke stroke, will add 2. Now, CHA2DS2-VASc Score = 3, ?2 oral anticoagulation recommended  On toprol at home   Hypertension  Home meds:   none  Stable  Hyperglycemia, HgbA1c 6.0, goal < 7.0  Hyperlipidemia  Home meds:  No  statin  LDL 142, goal < 70  Added for hyperlipidemia 40 mg started  Other Stroke Risk Factors  ETOH use  Migraines  Other Active Problems  Hypothyroid  Macrocytosis  Currently using Estradiol vaginal ring   Other Pertinent History  R breast adenocarcinoma s/p chemo 11/2001  Hospital day # 3  I have personally examined this patient, reviewed notes, independently viewed imaging studies, participated in medical decision making and plan of care. I have made any additions or clarifications directly to the above note.   She presented with right MCA infarct secondary to embolization from atrial fibrillation-flutter. She was treated with IV TPA and mechanical embolectomy with complete recanalization of the right MCA.  Marland Kitchen Atorvastatin added for hyperlipidemia. Likely discharge home later today after pharmacy consult for warfarin as she cannot afford NOACs at present time due to high deductible with her health insurance. Continue aspirin until INR  is optimal. Follow up at the Coumadin clinic at [facility]. Follow up with Dr. Daneen Schick next month and in the stroke clinic with me in 2 months. Long discussion with patient and husband and answered questions. Antony Contras, MD Medical Director [facility] Pager: [phone] 01/06/2015 1:35 PM    To contact Stroke Continuity provider, please refer to [email or web address] After hours, contact General Neurology

## 2015-01-06 NOTE — Progress Notes (Signed)
ANTICOAGULATION CONSULT NOTE - Initial Consult  Pharmacy Consult for Coumadin Indication: CVA  Allergies  Allergen Reactions  . Lambs Quarters Hives, Itching and Nausea And Vomiting  . Other Itching and Rash    wool    Patient Measurements: Height: 5\' 6"  (167.6 cm) Weight: 116 lb 6.5 oz (52.8 kg) IBW/kg (Calculated) : 59.3  Vital Signs: Temp: 99.7 F (37.6 C) (07/29 0923) Temp Source: Oral (07/29 0923) BP: 128/73 mmHg (07/29 0923) Pulse Rate: 98 (07/29 0923)  Labs:  Recent Labs  01/03/15 1420 01/04/15 0500 01/05/15 0317  HGB 13.6 11.2* 11.4*  HCT 40.5 33.0* 34.8*  PLT 210 190 152  APTT 27  --   --   LABPROT 13.6  --   --   INR 1.02  --   --   CREATININE 0.88 0.79 0.80    Estimated Creatinine Clearance: 60 mL/min (by C-G formula based on Cr of 0.8).   Medical History: Past Medical History  Diagnosis Date  . Vitamin D deficiency 07/2006  . Diverticulitis 2009    diverticulitis  . Adenocarcinoma of breast 06/03    RIGHT  . Amenorrhea 09/01  . Migraine 09/01  . Infertility, female 09/01    4 SAB  . STD (sexually transmitted disease)     Hx of HSV  . Amenorrhea   . Metrorrhagia   . Chemotherapy adverse reaction   . Hypertension   . Permanent atrial fibrillation 04/01/2014    Medications:  Prescriptions prior to admission  Medication Sig Dispense Refill Last Dose  . aspirin 325 MG EC tablet Take 325 mg by mouth at bedtime.    01/02/2015 at Unknown time  . levothyroxine (SYNTHROID, LEVOTHROID) 50 MCG tablet TAKE 1 TABLET BY MOUTH EVERY DAY (Patient taking differently: Take 50 mcg by mouth at bedtime. ) 90 tablet 3 01/02/2015 at Unknown time  . metoprolol succinate (TOPROL-XL) 50 MG 24 hr tablet Take 1 and 1/2 tablet (75mg ) Daily 45 tablet 11 01/02/2015 at 1900  . estradiol (ESTRING) 2 MG vaginal ring Place 2 mg vaginally every 3 (three) months. 1 each 3 11/03/2014    Assessment: CVA 64 y/o F presented 7/28 with R MCA CVA - s/p tpa, neuro-intervention .  Patient has a h/o afib, but was not on anticoagulation PTA other than ASA. Baseline INR 1.02. This patients CHA2DS2-VASc Score 4. LDL 142. Lipitor started. Patient leaning towards long-term anticoagulation with warfarin due to high co-pay with newer anticoagulants.  Goal of Therapy:  INR 2-3 Monitor platelets by anticoagulation protocol: Yes   Plan:  Coumadin 5mg  po x 1 tonight. Coumadin book, video Daily INR   Gevon Markus S. Alford Highland, PharmD, BCPS Clinical Staff Pharmacist Pager [phone]  Eilene Ghazi Stillinger 01/06/2015,1:38 PM

## 2015-01-10 ENCOUNTER — Telehealth: Payer: Self-pay | Admitting: Interventional Cardiology

## 2015-01-10 NOTE — Telephone Encounter (Signed)
New Message      Pt calling wanting to speak to Dr. Tamala Julian about medication changes when she recently had her stroke and was in the hospital. Please call back and advise.

## 2015-01-10 NOTE — Telephone Encounter (Signed)
Returned pt call. Pt has several medications that she would like to ask Dr.Smith. She was started on Eliquis 5mg  bid. She was given a free 30day supply, but is concerned about the out of pocked cost going forward. Stressed to pt the importance of not missing any doses Her husband has been on Coumadin for years and does well with it. She was also given an Rx for Atorvastatin 40mg , she has not picked up the Rx she would like Dr.Smith's opinion before starting. She would also like to know if she still needs to be on Metoprolol. Pt would also like to know when Dr.Smith would like to see her for an o/v. Adv her I will fwd a message to Dr.Smith and call back with his response. Pt agreeable and verbalized understanding.

## 2015-01-11 NOTE — Telephone Encounter (Signed)
Called to give pt Dr.Smith's response.lmtcb 

## 2015-01-11 NOTE — Telephone Encounter (Signed)
Follow up ° ° ° ° ° °Returning Rhonda Paul's call °

## 2015-01-11 NOTE — Telephone Encounter (Signed)
Returned pt call. Pt aware of Dr.Smith's recommendation Start statin therapy as recommended Work in appt scheduled with Dr.Smith for 9/8 @ 8:30am. We will supply samples of Eliquis to get pt to her appt on 9/8.  Samples left at the front desk. Pt appreciative and verbalizes understanding.

## 2015-01-11 NOTE — Telephone Encounter (Signed)
Stay on Eliquis for 1 month and then we can switch to heparin if we can switch to coumadin. I need to see in office prior to coumadin switch. Needs to be in 2-3 weeks.  With h/o CVA, I would advocate statin therapy.

## 2015-01-11 NOTE — Telephone Encounter (Signed)
2nd attempt. Returned pt call.lmtcb

## 2015-01-11 NOTE — Telephone Encounter (Signed)
Follow Up   Pt called back

## 2015-01-13 ENCOUNTER — Encounter: Payer: Self-pay | Admitting: Nurse Practitioner

## 2015-01-13 ENCOUNTER — Ambulatory Visit (INDEPENDENT_AMBULATORY_CARE_PROVIDER_SITE_OTHER): Payer: 59 | Admitting: Nurse Practitioner

## 2015-01-13 ENCOUNTER — Inpatient Hospital Stay (HOSPITAL_COMMUNITY)
Admission: EM | Admit: 2015-01-13 | Discharge: 2015-01-17 | DRG: 282 | Disposition: A | Discharge status: Home / Self Care | Payer: 59 | Attending: Cardiology | Admitting: Cardiology

## 2015-01-13 ENCOUNTER — Encounter (HOSPITAL_COMMUNITY): Payer: Self-pay | Admitting: Emergency Medicine

## 2015-01-13 ENCOUNTER — Emergency Department (HOSPITAL_COMMUNITY): Payer: 59

## 2015-01-13 VITALS — BP 100/60 | HR 60 | Ht 65.0 in | Wt 159.0 lb

## 2015-01-13 DIAGNOSIS — Z01419 Encounter for gynecological examination (general) (routine) without abnormal findings: Secondary | ICD-10-CM

## 2015-01-13 DIAGNOSIS — Z8673 Personal history of transient ischemic attack (TIA), and cerebral infarction without residual deficits: Secondary | ICD-10-CM

## 2015-01-13 DIAGNOSIS — I10 Essential (primary) hypertension: Secondary | ICD-10-CM | POA: Diagnosis present

## 2015-01-13 DIAGNOSIS — Z79899 Other long term (current) drug therapy: Secondary | ICD-10-CM

## 2015-01-13 DIAGNOSIS — Z853 Personal history of malignant neoplasm of breast: Secondary | ICD-10-CM

## 2015-01-13 DIAGNOSIS — Z91048 Other nonmedicinal substance allergy status: Secondary | ICD-10-CM

## 2015-01-13 DIAGNOSIS — I214 Non-ST elevation (NSTEMI) myocardial infarction: Secondary | ICD-10-CM | POA: Diagnosis not present

## 2015-01-13 DIAGNOSIS — Z91018 Allergy to other foods: Secondary | ICD-10-CM

## 2015-01-13 DIAGNOSIS — Z0000 Encounter for general adult medical examination without abnormal findings: Secondary | ICD-10-CM | POA: Diagnosis not present

## 2015-01-13 DIAGNOSIS — I482 Chronic atrial fibrillation: Secondary | ICD-10-CM | POA: Diagnosis present

## 2015-01-13 DIAGNOSIS — Z7901 Long term (current) use of anticoagulants: Secondary | ICD-10-CM

## 2015-01-13 DIAGNOSIS — I4821 Permanent atrial fibrillation: Secondary | ICD-10-CM | POA: Diagnosis present

## 2015-01-13 DIAGNOSIS — E785 Hyperlipidemia, unspecified: Secondary | ICD-10-CM | POA: Diagnosis present

## 2015-01-13 DIAGNOSIS — I272 Other secondary pulmonary hypertension: Secondary | ICD-10-CM | POA: Diagnosis present

## 2015-01-13 DIAGNOSIS — Z8049 Family history of malignant neoplasm of other genital organs: Secondary | ICD-10-CM

## 2015-01-13 DIAGNOSIS — E039 Hypothyroidism, unspecified: Secondary | ICD-10-CM

## 2015-01-13 DIAGNOSIS — Z888 Allergy status to other drugs, medicaments and biological substances status: Secondary | ICD-10-CM

## 2015-01-13 DIAGNOSIS — I25111 Atherosclerotic heart disease of native coronary artery with angina pectoris with documented spasm: Secondary | ICD-10-CM | POA: Diagnosis present

## 2015-01-13 DIAGNOSIS — D473 Essential (hemorrhagic) thrombocythemia: Secondary | ICD-10-CM | POA: Diagnosis present

## 2015-01-13 HISTORY — DX: Long term (current) use of anticoagulants: Z79.01

## 2015-01-13 LAB — CBC WITH DIFFERENTIAL/PLATELET
Basophils Absolute: 0.1 10*3/uL (ref 0.0–0.1)
Basophils Relative: 1 % (ref 0–1)
Eosinophils Absolute: 0.1 10*3/uL (ref 0.0–0.7)
Eosinophils Relative: 2 % (ref 0–5)
HCT: 40.2 % (ref 36.0–46.0)
Hemoglobin: 13.5 g/dL (ref 12.0–15.0)
Lymphocytes Relative: 31 % (ref 12–46)
Lymphs Abs: 2.7 10*3/uL (ref 0.7–4.0)
MCH: 34.5 pg — ABNORMAL HIGH (ref 26.0–34.0)
MCHC: 33.6 g/dL (ref 30.0–36.0)
MCV: 102.8 fL — ABNORMAL HIGH (ref 78.0–100.0)
Monocytes Absolute: 0.9 10*3/uL (ref 0.1–1.0)
Monocytes Relative: 10 % (ref 3–12)
Neutro Abs: 4.9 10*3/uL (ref 1.7–7.7)
Neutrophils Relative %: 56 % (ref 43–77)
Platelets: 462 10*3/uL — ABNORMAL HIGH (ref 150–400)
RBC: 3.91 MIL/uL (ref 3.87–5.11)
RDW: 12.8 % (ref 11.5–15.5)
WBC: 8.7 10*3/uL (ref 4.0–10.5)

## 2015-01-13 LAB — I-STAT TROPONIN, ED: Troponin i, poc: 0 ng/mL (ref 0.00–0.08)

## 2015-01-13 LAB — BASIC METABOLIC PANEL
Anion gap: 11 (ref 5–15)
BUN: 15 mg/dL (ref 6–20)
CO2: 22 mmol/L (ref 22–32)
Calcium: 9.8 mg/dL (ref 8.9–10.3)
Chloride: 103 mmol/L (ref 101–111)
Creatinine, Ser: 0.89 mg/dL (ref 0.44–1.00)
GFR calc Af Amer: >60 mL/min (ref >60)
GFR calc non Af Amer: >60 mL/min (ref >60)
Glucose, Bld: 142 mg/dL — ABNORMAL HIGH (ref 65–99)
Potassium: 5.1 mmol/L (ref 3.5–5.1)
Sodium: 136 mmol/L (ref 135–145)

## 2015-01-13 LAB — TROPONIN I
Troponin I: 1.21 ng/mL (ref <0.031)
Troponin I: 6.29 ng/mL (ref <0.031)

## 2015-01-13 MED ORDER — METOPROLOL TARTRATE 1 MG/ML IV SOLN
2.5000 mg | Freq: Once | INTRAVENOUS | Status: AC
  Administered 2015-01-13: 2.5 mg via INTRAVENOUS
  Filled 2015-01-13: qty 5

## 2015-01-13 MED ORDER — HEPARIN BOLUS VIA INFUSION
2000.0000 [IU] | Freq: Once | INTRAVENOUS | Status: AC
  Administered 2015-01-14: 2000 [IU] via INTRAVENOUS
  Filled 2015-01-13: qty 2000

## 2015-01-13 MED ORDER — LEVOTHYROXINE SODIUM 50 MCG PO TABS: 50.0000 ug | ORAL_TABLET | Freq: Every day | ORAL | Status: DC

## 2015-01-13 MED ORDER — HEPARIN (PORCINE) IN NACL 100-0.45 UNIT/ML-% IJ SOLN
1000.0000 [IU]/h | INTRAMUSCULAR | Status: DC
  Administered 2015-01-13 – 2015-01-15 (×3): 1000 [IU]/h via INTRAVENOUS
  Filled 2015-01-13 (×3): qty 250

## 2015-01-13 MED ORDER — ASPIRIN 81 MG PO CHEW
324.0000 mg | CHEWABLE_TABLET | Freq: Once | ORAL | Status: AC
  Administered 2015-01-13: 324 mg via ORAL
  Filled 2015-01-13: qty 4

## 2015-01-13 NOTE — Progress Notes (Signed)
ANTICOAGULATION CONSULT NOTE - Initial Consult  Pharmacy Consult for Heparin  Indication: chest pain/ACS, rising troponin   Allergies  Allergen Reactions  . Lambs Quarters Hives, Itching and Nausea And Vomiting  . Lanolin Acid [Lanolin] Rash  . Other Itching and Rash    wool    Patient Measurements: ~72 kg  Vital Signs: Temp: 97.6 F (36.4 C) (08/05 1616) Temp Source: Oral (08/05 1616) BP: 107/67 mmHg (08/05 2315) Pulse Rate: 70 (08/05 2315)  Labs:  Recent Labs  01/13/15 1616 01/13/15 1926 01/13/15 2203  HGB 13.5  --   --   HCT 40.2  --   --   PLT 462*  --   --   CREATININE 0.89  --   --   TROPONINI  --  1.21* 6.29*    Estimated Creatinine Clearance: 64.3 mL/min (by C-G formula based on Cr of 0.89).   Medical History: Past Medical History  Diagnosis Date  . Vitamin D deficiency 07/2006  . Diverticulitis 2009    diverticulitis  . Adenocarcinoma of breast 06/03    RIGHT  . Amenorrhea 09/01  . Migraine 09/01  . Infertility, female 09/01    4 SAB  . Amenorrhea   . Metrorrhagia   . Chemotherapy adverse reaction   . Hypertension   . Permanent atrial fibrillation 04/01/2014  . Stroke 01/03/15  . STD (sexually transmitted disease) 1976    Hx of HSV    Assessment: 64 y/o F with rising troponin to start heparin per pharmacy. Pt is on Apixaban PTA with last dose this AM. With rising troponin, will not wait for baseline heparin level. Will likely need to use aPTT to dose for 24-48 hours.   Goal of Therapy:  Heparin level 0.3-0.7 units/ml aPTT 66-102 seconds Monitor platelets by anticoagulation protocol: Yes   Plan:  -Heparin 2000 units BOLUS -Start heparin drip at 1000 units/hr -0700 HL -Daily CBC/HL -Monitor for bleeding  Narda Bonds 01/13/2015,11:38 PM

## 2015-01-13 NOTE — Progress Notes (Signed)
Patient ID: Rhonda Paul, female   DOB: 09-Apr-1951, 64 y.o.   MRN: [id number] 64 y.o. [id number] Married  Caucasian Fe here for annual exam.  She just had an episode of walking into her managers office and was having slurred speech.  She then was treated for a stroke with percurtanous removed of hematoma in the brain.  Now no residual weakness.  Not back to normal sleep.  Feels well except fatigue.  Patient's last menstrual period was 11/08/2001 (approximate).          Sexually active: Yes.    The current method of family planning is post menopausal status.    Exercising: Yes.    yoga twice weekly and treadmill for 30-45 minutes 3-4 days per week.  Also active with yard/housework. Smoker:  no  Health Maintenance: Pap: 11/14/2011 Normal with negative HR HPV  MMG: 11/22/14, Bi-Rads 1: negative Colonoscopy: 11/2012 - 2 polyps and diverticula recheck in 5 years due to family history BMD: 01/20/14, T Score: spine -0.7/right hip -0.6/left hip -0.4  TDaP: 10/09/2010  Labs: 01/03/15, will need labs for TSH Urine:  Declined   reports that she has never smoked. She has never used smokeless tobacco. She reports that she drinks alcohol. She reports that she does not use illicit drugs.  Past Medical History  Diagnosis Date  . Vitamin D deficiency 07/2006  . Diverticulitis 2009    diverticulitis  . Adenocarcinoma of breast 06/03    RIGHT  . Amenorrhea 09/01  . Migraine 09/01  . Infertility, female 09/01    4 SAB  . STD (sexually transmitted disease)     Hx of HSV  . Amenorrhea   . Metrorrhagia   . Chemotherapy adverse reaction   . Hypertension   . Permanent atrial fibrillation 04/01/2014  . Stroke 01/03/15    Past Surgical History  Procedure Laterality Date  . Colonoscopy  11/2012    diverticulitis, 2 polyps negative, repeat in 5 years  . Appendectomy  1959  . Breast surgery Right 11/2001    lumpectomy with sentinel node biopsy X 3 all negative, ER/PR negative , chemo 6 doses, and  radiation daily X 6 weeks  . Radiology with anesthesia N/A 01/03/2015    Procedure: RADIOLOGY WITH ANESTHESIA;  Surgeon: Medication Radiologist, MD;  Location: [facility];  Service: Radiology;  Laterality: N/A;    Current Outpatient Prescriptions  Medication Sig Dispense Refill  . apixaban (ELIQUIS) 5 MG TABS tablet Take 1 tablet (5 mg total) by mouth 2 (two) times daily. 60 tablet 3  . atorvastatin (LIPITOR) 40 MG tablet Take 1 tablet (40 mg total) by mouth daily at 6 PM. 30 tablet 3  . levothyroxine (SYNTHROID, LEVOTHROID) 50 MCG tablet Take 1 tablet (50 mcg total) by mouth at bedtime. 90 tablet 3  . metoprolol succinate (TOPROL-XL) 50 MG 24 hr tablet Take 1 and 1/2 tablet (75mg ) Daily 45 tablet 11   No current facility-administered medications for this visit.    Family History  Problem Relation Age of Onset  . Colon cancer Mother   . Cervical cancer Mother   . Osteoporosis Mother   . Cancer Mother   . Heart disease Father   . Multiple births Maternal Grandmother   . Multiple births Paternal Grandmother   . Heart disease Brother   . Breast cancer Maternal Aunt   . Cancer Maternal Aunt     ROS:  Pertinent items are noted in HPI.  Otherwise, a comprehensive ROS was negative.  Exam:  BP 100/60 mmHg  Pulse 60  Ht 5\' 5"  (1.651 m)  Wt 159 lb (72.122 kg)  BMI 26.46 kg/m2  LMP 11/08/2001 (Approximate) Height: 5\' 5"  (165.1 cm) Ht Readings from Last 3 Encounters:  01/13/15 5\' 5"  (1.651 m)  01/03/15 5\' 6"  (1.676 m)  04/01/14 5\' 5"  (1.651 m)    General appearance: alert, cooperative and appears stated age Head: Normocephalic, without obvious abnormality, atraumatic Neck: no adenopathy, supple, symmetrical, trachea midline and thyroid normal to inspection and palpation Lungs: clear to auscultation bilaterally Breasts: normal appearance, no masses or tenderness, with surgical and radiation changes on the right.  no new mass Heart: regular rate and rhythm Abdomen: soft, non-tender; no  masses,  no organomegaly Extremities: extremities normal, atraumatic, no cyanosis or edema Skin: Skin color, texture, turgor normal. No rashes or lesions Lymph nodes: Cervical, supraclavicular, and axillary nodes normal. No abnormal inguinal nodes palpated Neurologic: Grossly normal   Pelvic: External genitalia:  no lesions              Urethra:  normal appearing urethra with no masses, tenderness or lesions              Bartholin's and Skene's: normal                 Vagina: normal appearing vagina with normal color and discharge, no lesions              Cervix: anteverted              Pap taken: Yes.   Bimanual Exam:  Uterus:  normal size, contour, position, consistency, mobility, non-tender              Adnexa: no mass, fullness, tenderness               Rectovaginal: Confirms               Anus:  normal sphincter tone, no lesions  Chaperone present: yes  A:  Well Woman with normal exam  Postmenopausal Atrophic vaginitis on Estring - but will DC with new information of CVA  Right Breast cancer stage 2 ER/PR neg with lumpectomy 11/2001, treated with chemotherapy and radiation  Hypothyroid    P:   Reviewed health and wellness pertinent to exam  Pap smear as above  Mammogram is past due and will schedule  Will DC Estring and rationale was explained  Refill of Synthroid 50 mcg for a year and will follow with labs  Counseled on breast self exam, mammography screening, adequate intake of calcium and vitamin D, diet and exercise return annually or prn  An After Visit Summary was printed and given to the patient.

## 2015-01-13 NOTE — ED Notes (Signed)
Pt requesting IV be placed after 3rd troponin results.

## 2015-01-13 NOTE — ED Notes (Signed)
Pt here with mid sternal CP; pt seen here last week for stroke; pt denies SOB; pt sts some dizziness

## 2015-01-13 NOTE — ED Notes (Signed)
Lab called with troponin of 121

## 2015-01-13 NOTE — ED Provider Notes (Addendum)
CSN: [id number]     Arrival date & time 01/13/15  1607 History   First MD Initiated Contact with Patient 01/13/15 1900     Chief Complaint  Patient presents with  . Chest Pain     (Consider location/radiation/quality/duration/timing/severity/associated sxs/prior Treatment) HPI Comments: 64yo F w/ PMH including recent CVA requiring TPA, diverticulitis, A fib on eliquis, HTN who presents with chest pain and nausea. The patient states that earlier today, she was riding in the car and had a gradual onset of central, nonradiating chest pain. The pain persisted approximately 2 hours and eventually subsided. It was associated with nausea. She has had one episode of vomiting. She denies any associated shortness of breath. No radiation of the pain to her back. She has never had this before. Currently, she is symptom free and feels well. She has been recovering appropriately since her recent hospitalization for CVA. No fevers, cough/cold symptoms, abdominal pain, or recent illness. Last dose of Eliquis was this morning.  Patient is a 64 y.o. female presenting with chest pain. The history is provided by the patient.  Chest Pain   Past Medical History  Diagnosis Date  . Vitamin D deficiency 07/2006  . Diverticulitis 2009    diverticulitis  . Adenocarcinoma of breast 06/03    RIGHT  . Amenorrhea 09/01  . Migraine 09/01  . Infertility, female 09/01    4 SAB  . Amenorrhea   . Metrorrhagia   . Chemotherapy adverse reaction   . Hypertension   . Permanent atrial fibrillation 04/01/2014  . Stroke 01/03/15  . STD (sexually transmitted disease) 1976    Hx of HSV   Past Surgical History  Procedure Laterality Date  . Colonoscopy  11/2012    diverticulitis, 2 polyps negative, repeat in 5 years  . Appendectomy  1959  . Breast surgery Right 11/2001    lumpectomy with sentinel node biopsy X 3 all negative, ER/PR negative , chemo 6 doses, and radiation daily X 6 weeks  . Radiology with anesthesia N/A  01/03/2015    Procedure: RADIOLOGY WITH ANESTHESIA;  Surgeon: Medication Radiologist, MD;  Location: [place];  Service: Radiology;  Laterality: N/A;   Family History  Problem Relation Age of Onset  . Colon cancer Mother   . Cervical cancer Mother   . Osteoporosis Mother   . Cancer Mother   . Heart disease Father   . Multiple births Maternal Grandmother   . Multiple births Paternal Grandmother   . Heart disease Brother   . Breast cancer Maternal Aunt   . Cancer Maternal Aunt    History  Substance Use Topics  . Smoking status: Never Smoker   . Smokeless tobacco: Never Used     Comment: never used tobacco  . Alcohol Use: 0.0 oz/week    7-14 Glasses of wine per week   OB History    Gravida Para Term Preterm AB TAB SAB Ectopic Multiple Living   4    4 3 1         Review of Systems  Cardiovascular: Positive for chest pain.    10 Systems reviewed and are negative for acute change except as noted in the HPI.   Allergies  Lambs quarters; Lanolin acid; and Other  Home Medications   Prior to Admission medications   Medication Sig Start Date End Date Taking? Authorizing Provider  acetaminophen (TYLENOL) 500 MG tablet Take 1,000 mg by mouth every 6 (six) hours as needed for moderate pain or headache.   Yes  Historical Provider, MD  apixaban (ELIQUIS) 5 MG TABS tablet Take 1 tablet (5 mg total) by mouth 2 (two) times daily. 01/06/15  Yes Donzetta Starch, NP  atorvastatin (LIPITOR) 40 MG tablet Take 1 tablet (40 mg total) by mouth daily at 6 PM. 01/06/15  Yes Donzetta Starch, NP  levothyroxine (SYNTHROID, LEVOTHROID) 50 MCG tablet Take 1 tablet (50 mcg total) by mouth at bedtime. Patient taking differently: Take 50 mcg by mouth daily before breakfast.  01/13/15  Yes Kem Boroughs, FNP  metoprolol succinate (TOPROL-XL) 50 MG 24 hr tablet Take 1 and 1/2 tablet (75mg ) Daily 04/01/14  Yes Belva Crome, MD   BP 114/70 mmHg  Pulse 66  Temp(Src) 97.6 F (36.4 C) (Oral)  Resp 25  SpO2 98%  LMP  11/08/2001 (Approximate) Physical Exam  Constitutional: She is oriented to person, place, and time. She appears well-developed and well-nourished. No distress.  HENT:  Head: Normocephalic and atraumatic.  Moist mucous membranes  Eyes: Conjunctivae are normal. Pupils are equal, round, and reactive to light.  Neck: Neck supple.  Cardiovascular: Normal rate, regular rhythm and normal heart sounds.   No murmur heard. Pulmonary/Chest: Effort normal and breath sounds normal.  Abdominal: Soft. Bowel sounds are normal. She exhibits no distension. There is no tenderness.  Musculoskeletal: She exhibits no edema.  Neurological: She is alert and oriented to person, place, and time.  Fluent speech  Skin: Skin is warm and dry.  Psychiatric: She has a normal mood and affect. Judgment normal.  Nursing note and vitals reviewed.   ED Course  Procedures (including critical care time) Labs Review Labs Reviewed  BASIC METABOLIC PANEL - Abnormal; Notable for the following:    Glucose, Bld 142 (*)    All other components within normal limits  CBC WITH DIFFERENTIAL/PLATELET - Abnormal; Notable for the following:    MCV 102.8 (*)    MCH 34.5 (*)    Platelets 462 (*)    All other components within normal limits  TROPONIN I - Abnormal; Notable for the following:    Troponin I 1.21 (*)    All other components within normal limits  TROPONIN I - Abnormal; Notable for the following:    Troponin I 6.29 (*)    All other components within normal limits  HEPARIN LEVEL (UNFRACTIONATED)  CBC  I-STAT TROPOININ, ED    Imaging Review Dg Chest 2 View  01/13/2015   CLINICAL DATA:  Centralized inferior chest pain since this morning, vomiting this afternoon, stroke last week, history hypertension, atrial fibrillation  EXAM: CHEST  2 VIEW  COMPARISON:  01/05/2015  FINDINGS: Mild enlargement of cardiac silhouette.  Mediastinal contours and pulmonary vascularity normal.  Lungs mildly hyperinflated but clear.  No  pleural effusion or pneumothorax.  No acute osseous findings.  Surgical clips RIGHT breast and axilla.  IMPRESSION: Mild enlargement of cardiac silhouette.  No acute abnormalities.   Electronically Signed   By: Lavonia Dana M.D.   On: 01/13/2015 17:02     EKG Interpretation   Date/Time:  Friday January 13 2015 21:15:25 EDT Ventricular Rate:  74 PR Interval:    QRS Duration: 84 QT Interval:  446 QTC Calculation: 495 R Axis:   -47 Text Interpretation:  Atrial fibrillation/ flutter Left anterior  fascicular block Confirmed by LITTLE MD, RACHEL ([id number]) on 01/13/2015  11:53:46 PM      MDM   Final diagnoses:  NSTEMI (non-ST elevated myocardial infarction)   64 year old female presents with a two-hour episode  of central chest pain associated with nausea. Patient well-appearing and asymptomatic at presentation. Vital signs unremarkable. EKG showed atrial fibrillation with no ischemic changes. Obtained above lab work which showed an initial troponin of 0. Chest x-ray was unremarkable. The patient has been compliant with Eliquis and denies any shortness of breath, thus I feel that PE is unlikely. Obtained a 3 hour troponin which was elevated at 1.21. There was some concern over a possible mix up with the tube for second troponin thus it was repeated to ensure that it was not a lab error. Repeat troponin remains elevated, consistent with ACS. Initiated ACS protocol with aspirin and heparin. Spoke with Cardiology, and per their instructions gave the patient 2.5mg  lopressor as she currently has sBP over 100 and HR in 70s . Patient has remained chest pain-free in the emergency department with no hypertension or tachycardia. Patient admitted to cardiology for further care.  CRITICAL CARE Performed by: Wenda Overland Little   Total critical care time: 45 minutes  Critical care time was exclusive of separately billable procedures and treating other patients.  Critical care was necessary to treat or  prevent imminent or life-threatening deterioration.  Critical care was time spent personally by me on the following activities: development of treatment plan with patient and/or surrogate as well as nursing, discussions with consultants, evaluation of patient's response to treatment, examination of patient, obtaining history from patient or surrogate, ordering and performing treatments and interventions, ordering and review of laboratory studies, ordering and review of radiographic studies, pulse oximetry and re-evaluation of patient's condition.   Sharlett Iles, MD 01/14/15 Rhea, MD 01/14/15 [id number]

## 2015-01-13 NOTE — ED Notes (Signed)
Dr Rex Kras and the pt both confirmed that we can use the right extremity for IV access. Restricted extremity band taken off.

## 2015-01-13 NOTE — Patient Instructions (Signed)

## 2015-01-14 ENCOUNTER — Encounter (HOSPITAL_COMMUNITY): Payer: Self-pay | Admitting: *Deleted

## 2015-01-14 DIAGNOSIS — Z79899 Other long term (current) drug therapy: Secondary | ICD-10-CM | POA: Diagnosis not present

## 2015-01-14 DIAGNOSIS — E785 Hyperlipidemia, unspecified: Secondary | ICD-10-CM | POA: Diagnosis present

## 2015-01-14 DIAGNOSIS — Z91048 Other nonmedicinal substance allergy status: Secondary | ICD-10-CM | POA: Diagnosis not present

## 2015-01-14 DIAGNOSIS — I10 Essential (primary) hypertension: Secondary | ICD-10-CM | POA: Diagnosis present

## 2015-01-14 DIAGNOSIS — I2510 Atherosclerotic heart disease of native coronary artery without angina pectoris: Secondary | ICD-10-CM | POA: Diagnosis not present

## 2015-01-14 DIAGNOSIS — I272 Other secondary pulmonary hypertension: Secondary | ICD-10-CM | POA: Diagnosis present

## 2015-01-14 DIAGNOSIS — Z853 Personal history of malignant neoplasm of breast: Secondary | ICD-10-CM | POA: Diagnosis not present

## 2015-01-14 DIAGNOSIS — Z888 Allergy status to other drugs, medicaments and biological substances status: Secondary | ICD-10-CM | POA: Diagnosis not present

## 2015-01-14 DIAGNOSIS — Z91018 Allergy to other foods: Secondary | ICD-10-CM | POA: Diagnosis not present

## 2015-01-14 DIAGNOSIS — I482 Chronic atrial fibrillation: Secondary | ICD-10-CM | POA: Diagnosis present

## 2015-01-14 DIAGNOSIS — I214 Non-ST elevation (NSTEMI) myocardial infarction: Principal | ICD-10-CM

## 2015-01-14 DIAGNOSIS — Z7901 Long term (current) use of anticoagulants: Secondary | ICD-10-CM | POA: Diagnosis not present

## 2015-01-14 DIAGNOSIS — I25111 Atherosclerotic heart disease of native coronary artery with angina pectoris with documented spasm: Secondary | ICD-10-CM | POA: Diagnosis present

## 2015-01-14 DIAGNOSIS — D473 Essential (hemorrhagic) thrombocythemia: Secondary | ICD-10-CM | POA: Diagnosis present

## 2015-01-14 DIAGNOSIS — Z8673 Personal history of transient ischemic attack (TIA), and cerebral infarction without residual deficits: Secondary | ICD-10-CM | POA: Diagnosis not present

## 2015-01-14 DIAGNOSIS — Z8049 Family history of malignant neoplasm of other genital organs: Secondary | ICD-10-CM | POA: Diagnosis not present

## 2015-01-14 LAB — TROPONIN I
Troponin I: 8.62 ng/mL (ref <0.031)
Troponin I: 5.8 ng/mL (ref <0.031)
Troponin I: 3.47 ng/mL (ref <0.031)

## 2015-01-14 LAB — APTT
aPTT: 80 seconds — ABNORMAL HIGH (ref 24–37)
aPTT: 80 seconds — ABNORMAL HIGH (ref 24–37)

## 2015-01-14 LAB — BRAIN NATRIURETIC PEPTIDE: B Natriuretic Peptide: 434.2 pg/mL — ABNORMAL HIGH (ref 0.0–100.0)

## 2015-01-14 LAB — HEPARIN LEVEL (UNFRACTIONATED): Heparin Unfractionated: 1.6 IU/mL — ABNORMAL HIGH (ref 0.30–0.70)

## 2015-01-14 LAB — CBC
HCT: 35.8 % — ABNORMAL LOW (ref 36.0–46.0)
Hemoglobin: 12.1 g/dL (ref 12.0–15.0)
MCH: 33.7 pg (ref 26.0–34.0)
MCHC: 33.8 g/dL (ref 30.0–36.0)
MCV: 99.7 fL (ref 78.0–100.0)
Platelets: 450 10*3/uL — ABNORMAL HIGH (ref 150–400)
RBC: 3.59 MIL/uL — ABNORMAL LOW (ref 3.87–5.11)
RDW: 12.6 % (ref 11.5–15.5)
WBC: 9.3 10*3/uL (ref 4.0–10.5)

## 2015-01-14 LAB — TSH: TSH: 2.228 u[IU]/mL (ref 0.350–4.500)

## 2015-01-14 MED ORDER — METOPROLOL TARTRATE 25 MG PO TABS
25.0000 mg | ORAL_TABLET | Freq: Two times a day (BID) | ORAL | Status: DC
  Administered 2015-01-14 – 2015-01-17 (×6): 25 mg via ORAL
  Filled 2015-01-14 (×6): qty 1

## 2015-01-14 MED ORDER — LEVOTHYROXINE SODIUM 50 MCG PO TABS
50.0000 ug | ORAL_TABLET | Freq: Every day | ORAL | Status: DC
  Administered 2015-01-14 – 2015-01-17 (×4): 50 ug via ORAL
  Filled 2015-01-14 (×4): qty 1

## 2015-01-14 MED ORDER — METOPROLOL TARTRATE 12.5 MG HALF TABLET
12.5000 mg | ORAL_TABLET | Freq: Two times a day (BID) | ORAL | Status: DC
  Administered 2015-01-14: 12.5 mg via ORAL
  Filled 2015-01-14 (×2): qty 1

## 2015-01-14 MED ORDER — ATORVASTATIN CALCIUM 40 MG PO TABS
40.0000 mg | ORAL_TABLET | Freq: Every day | ORAL | Status: DC
  Administered 2015-01-14 – 2015-01-15 (×3): 40 mg via ORAL
  Filled 2015-01-14 (×3): qty 1

## 2015-01-14 MED ORDER — NITROGLYCERIN 0.4 MG SL SUBL: 0.4000 mg | SUBLINGUAL_TABLET | SUBLINGUAL | Status: DC | PRN

## 2015-01-14 MED ORDER — ASPIRIN EC 81 MG PO TBEC
81.0000 mg | DELAYED_RELEASE_TABLET | Freq: Every day | ORAL | Status: DC
  Administered 2015-01-14 – 2015-01-17 (×2): 81 mg via ORAL
  Filled 2015-01-14 (×3): qty 1

## 2015-01-14 MED ORDER — METOPROLOL TARTRATE 50 MG PO TABS: 50.0000 mg | ORAL_TABLET | Freq: Two times a day (BID) | ORAL | Status: DC

## 2015-01-14 MED ORDER — ACETAMINOPHEN 325 MG PO TABS: 650.0000 mg | ORAL_TABLET | ORAL | Status: DC | PRN

## 2015-01-14 MED ORDER — ONDANSETRON HCL 4 MG/2ML IJ SOLN: 4.0000 mg | Freq: Four times a day (QID) | INTRAMUSCULAR | Status: DC | PRN

## 2015-01-14 MED ORDER — CLOPIDOGREL BISULFATE 75 MG PO TABS
600.0000 mg | ORAL_TABLET | Freq: Once | ORAL | Status: AC
  Administered 2015-01-14: 600 mg via ORAL
  Filled 2015-01-14: qty 8

## 2015-01-14 MED ORDER — ATORVASTATIN CALCIUM 40 MG PO TABS: 40.0000 mg | ORAL_TABLET | Freq: Every day | ORAL | Status: DC

## 2015-01-14 MED ORDER — CLOPIDOGREL BISULFATE 75 MG PO TABS
75.0000 mg | ORAL_TABLET | Freq: Every day | ORAL | Status: DC
  Administered 2015-01-15 – 2015-01-16 (×2): 75 mg via ORAL
  Filled 2015-01-14 (×3): qty 1

## 2015-01-14 NOTE — Progress Notes (Signed)
Pt questioning why she is only on 12.5mg  of metoprolol. Takes 75mg  BID at home. Notified Cecilie Kicks NP, new dose ordered. Carroll Kinds RN

## 2015-01-14 NOTE — Progress Notes (Signed)
Utilization review completed.  

## 2015-01-14 NOTE — Progress Notes (Signed)
Subjective:  No recurrent chest pain, no shortness of breath, no neurologic defects  Objective:  Vital Signs in the last 24 hours: BP 127/71 mmHg  Pulse 74  Temp(Src) 98.1 F (36.7 C) (Oral)  Resp 14  Ht 5\' 5"  (1.651 m)  Wt 72.077 kg (158 lb 14.4 oz)  BMI 26.44 kg/m2  SpO2 96%  LMP 11/08/2001 (Approximate)  Physical Exam: Pleasant female in no acute distress Lungs:  Clear Cardiac:  Irregular rhythm, normal S1 and S2, no S3, no murmur Abdomen:  Soft, nontender, no masses Extremities:  No edema present  Intake/Output from previous day:    Weight Filed Weights   01/14/15 0107  Weight: 72.077 kg (158 lb 14.4 oz)    Lab Results: Basic Metabolic Panel:  Recent Labs  01/13/15 1616  NA 136  K 5.1  CL 103  CO2 22  GLUCOSE 142*  BUN 15  CREATININE 0.89   CBC:  Recent Labs  01/13/15 1616 01/14/15 0254  WBC 8.7 9.3  NEUTROABS 4.9  --   HGB 13.5 12.1  HCT 40.2 35.8*  MCV 102.8* 99.7  PLT 462* 450*   Cardiac Enzymes: Troponin (Point of Care Test)  Recent Labs  01/13/15 1635  TROPIPOC 0.00   Cardiac Panel (last 3 results)  Recent Labs  01/13/15 2203 01/14/15 0254 01/14/15 0735  TROPONINI 6.29* 8.62* 5.80*    Telemetry: Sinus rhythm  Assessment/Plan:  1.  Non-STEMI 2.  Recent stroke 3.  Chronic atrial fibrillation  Recommendations:  Continue heparin over weekend and plan catheterization Monday after Eliquis washes out.     Kerry Hough  MD [facility]  01/14/2015, 11:48 AM

## 2015-01-14 NOTE — Progress Notes (Signed)
ANTICOAGULATION CONSULT NOTE - [place] for Heparin  Indication: chest pain/ACS, rising troponin   Allergies  Allergen Reactions  . Lambs Quarters Hives, Itching and Nausea And Vomiting  . Lanolin Acid [Lanolin] Rash  . Other Itching and Rash    wool    Patient Measurements: ~72 kg  Vital Signs: Temp: 98.1 F (36.7 C) (08/06 0509) Temp Source: Oral (08/06 0509) BP: 127/71 mmHg (08/06 0509) Pulse Rate: 74 (08/06 0509)  Labs:  Recent Labs  01/13/15 1616  01/13/15 2203 01/14/15 0254 01/14/15 0735  HGB 13.5  --   --  12.1  --   HCT 40.2  --   --  35.8*  --   PLT 462*  --   --  450*  --   APTT  --   --   --  80* 80*  HEPARINUNFRC  --   --   --   --  1.60*  CREATININE 0.89  --   --   --   --   TROPONINI  --   < > 6.29* 8.62* 5.80*  < > = values in this interval not displayed.  Estimated Creatinine Clearance: 64.3 mL/min (by C-G formula based on Cr of 0.89).   Medical History: Past Medical History  Diagnosis Date  . Vitamin D deficiency 07/2006  . Diverticulitis 2009    diverticulitis  . Adenocarcinoma of breast 06/03    RIGHT  . Amenorrhea 09/01  . Migraine 09/01  . Infertility, female 09/01    4 SAB  . Amenorrhea   . Metrorrhagia   . Chemotherapy adverse reaction   . Hypertension   . Permanent atrial fibrillation 04/01/2014  . Stroke 01/03/15  . STD (sexually transmitted disease) 1976    Hx of HSV    Assessment: 64 y/o F with persistent afib presented to ED with CP and found to have NSTEMI. Pt recently admitted for a CVA on 01/03/15 (only taking ASA) w/ thrombolysis given and Eliquis started during that admission; last Eliquis dose 8/5 AM.   Heparin 2000 unit bolus given, drip started at 1000 units/hr. APTT 80, HL 1.6 (no baseline). No changes made to rate, no bleeding reported. Plt high 462, Hgb wnl.  Goal of Therapy:  Heparin level 0.3-0.7 units/ml aPTT 66-102 seconds Monitor platelets by anticoagulation protocol: Yes    Plan:  -Continue heparin gtt at 1000 units/hr -Repeat CBC/HL/aPTT in AM -Monitor for bleeding   Governor Specking, PharmD Clinical Pharmacy Resident Pager: [phone]  01/14/2015,9:31 AM

## 2015-01-14 NOTE — ED Notes (Signed)
Cardiology at bedside.

## 2015-01-14 NOTE — H&P (Signed)
CC: CP  HPI: 63 yo [place] woman with persistent AF (on ASA), HTN, HLD and recent CVA treated with thrombolytic and discharge on Eluiquis presents to ER with mid sternal CP. Started in afternoon suddenly, moderate intensity, non radiating, lasted couple hours, not relieved with one NTG her husband gave her. By the time arrived in ER she was CP free. No fever, N/V, SOB, syncope, bleeding, cough, diaphoresis, PND, orthopnea, edema. No known CAD or prior MI or DM. IN ER received full dose ASA and started on Heparin infusion. Her TnI 1.2 and 6.28.    Review of Systems:  10 systems reviewed unremarkable except as noted in HPI    Past Medical History  Diagnosis Date  . Vitamin D deficiency 07/2006  . Diverticulitis 2009    diverticulitis  . Adenocarcinoma of breast 06/03    RIGHT  . Amenorrhea 09/01  . Migraine 09/01  . Infertility, female 09/01    4 SAB  . Amenorrhea   . Metrorrhagia   . Chemotherapy adverse reaction   . Hypertension   . Permanent atrial fibrillation 04/01/2014  . Stroke 01/03/15  . STD (sexually transmitted disease) 1976    Hx of HSV   No current facility-administered medications on file prior to encounter.   Current Outpatient Prescriptions on File Prior to Encounter  Medication Sig Dispense Refill  . apixaban (ELIQUIS) 5 MG TABS tablet Take 1 tablet (5 mg total) by mouth 2 (two) times daily. 60 tablet 3  . atorvastatin (LIPITOR) 40 MG tablet Take 1 tablet (40 mg total) by mouth daily at 6 PM. 30 tablet 3  . levothyroxine (SYNTHROID, LEVOTHROID) 50 MCG tablet Take 1 tablet (50 mcg total) by mouth at bedtime. (Patient taking differently: Take 50 mcg by mouth daily before breakfast. ) 90 tablet 3  . metoprolol succinate (TOPROL-XL) 50 MG 24 hr tablet Take 1 and 1/2 tablet (75mg ) Daily 45 tablet 11     Allergies  Allergen Reactions  . Lambs Quarters Hives, Itching and Nausea And Vomiting  . Lanolin Acid [Lanolin] Rash  . Other Itching and Rash    wool     History   Social History  . Marital Status: Married    Spouse Name: N/A  . Number of Children: N/A  . Years of Education: N/A   Occupational History  . Not on file.   Social History Main Topics  . Smoking status: Never Smoker   . Smokeless tobacco: Never Used     Comment: never used tobacco  . Alcohol Use: 0.0 oz/week    7-14 Glasses of wine per week  . Drug Use: No  . Sexual Activity: Yes    Birth Control/ Protection: Post-menopausal   Other Topics Concern  . Not on file   Social History Narrative    Family History  Problem Relation Age of Onset  . Colon cancer Mother   . Cervical cancer Mother   . Osteoporosis Mother   . Cancer Mother   . Heart disease Father   . Multiple births Maternal Grandmother   . Multiple births Paternal Grandmother   . Heart disease Brother   . Breast cancer Maternal Aunt   . Cancer Maternal Aunt     PHYSICAL EXAM: Filed Vitals:   01/14/15 0030  BP: 108/68  Pulse: 71  Temp:   Resp: 21   General:  Well appearing. No respiratory difficulty HEENT: normal Neck: supple. no JVD. Carotids 2+ bilat; no bruits. No lymphadenopathy or thryomegaly appreciated.  Cor: PMI nondisplaced. Irregular rate & rhythm. No rubs, gallops or murmurs. Lungs: clear Abdomen: soft, nontender, nondistended. No hepatosplenomegaly. No bruits or masses. Good bowel sounds. Extremities: no cyanosis, clubbing, rash, edema Neuro: alert & oriented x 3, cranial nerves grossly intact. moves all 4 extremities w/o difficulty. Affect pleasant.  ECG: AF V rate 70 bpm , no STEMI  Results for orders placed or performed during the hospital encounter of 01/13/15 (from the past 24 hour(s))  Basic metabolic panel     Status: Abnormal   Collection Time: 01/13/15  4:16 PM  Result Value Ref Range   Sodium 136 135 - 145 mmol/L   Potassium 5.1 3.5 - 5.1 mmol/L   Chloride 103 101 - 111 mmol/L   CO2 22 22 - 32 mmol/L   Glucose, Bld 142 (H) 65 - 99 mg/dL   BUN 15 6 - 20  mg/dL   Creatinine, Ser 0.89 0.44 - 1.00 mg/dL   Calcium 9.8 8.9 - 10.3 mg/dL   GFR calc non Af Amer >60 >60 mL/min   GFR calc Af Amer >60 >60 mL/min   Anion gap 11 5 - 15  CBC with Differential     Status: Abnormal   Collection Time: 01/13/15  4:16 PM  Result Value Ref Range   WBC 8.7 4.0 - 10.5 K/uL   RBC 3.91 3.87 - 5.11 MIL/uL   Hemoglobin 13.5 12.0 - 15.0 g/dL   HCT 40.2 36.0 - 46.0 %   MCV 102.8 (H) 78.0 - 100.0 fL   MCH 34.5 (H) 26.0 - 34.0 pg   MCHC 33.6 30.0 - 36.0 g/dL   RDW 12.8 11.5 - 15.5 %   Platelets 462 (H) 150 - 400 K/uL   Neutrophils Relative % 56 43 - 77 %   Neutro Abs 4.9 1.7 - 7.7 K/uL   Lymphocytes Relative 31 12 - 46 %   Lymphs Abs 2.7 0.7 - 4.0 K/uL   Monocytes Relative 10 3 - 12 %   Monocytes Absolute 0.9 0.1 - 1.0 K/uL   Eosinophils Relative 2 0 - 5 %   Eosinophils Absolute 0.1 0.0 - 0.7 K/uL   Basophils Relative 1 0 - 1 %   Basophils Absolute 0.1 0.0 - 0.1 K/uL  I-stat troponin, ED     Status: None   Collection Time: 01/13/15  4:35 PM  Result Value Ref Range   Troponin i, poc 0.00 0.00 - 0.08 ng/mL   Comment 3          Troponin I     Status: Abnormal   Collection Time: 01/13/15  7:26 PM  Result Value Ref Range   Troponin I 1.21 (HH) <0.031 ng/mL  Troponin I     Status: Abnormal   Collection Time: 01/13/15 10:03 PM  Result Value Ref Range   Troponin I 6.29 (HH) <0.031 ng/mL   Dg Chest 2 View  01/13/2015   CLINICAL DATA:  Centralized inferior chest pain since this morning, vomiting this afternoon, stroke last week, history hypertension, atrial fibrillation  EXAM: CHEST  2 VIEW  COMPARISON:  01/05/2015  FINDINGS: Mild enlargement of cardiac silhouette.  Mediastinal contours and pulmonary vascularity normal.  Lungs mildly hyperinflated but clear.  No pleural effusion or pneumothorax.  No acute osseous findings.  Surgical clips RIGHT breast and axilla.  IMPRESSION: Mild enlargement of cardiac silhouette.  No acute abnormalities.   Electronically Signed    By: Lavonia Dana M.D.   On: 01/13/2015 17:02   TTE 01/05/15  Impressions:  - The patient was in atrial fibrillation. Normal LV size with EF 55%. Normal RV size and systolic function. Moderate biatrial enlargement. Mild MR and TR. Mild pulmonary hypertension.   ASSESSMENT:  1. NSTEMI - Recent CVA treated with thrombolytic in the setting of persistent AF and at that time she was not on anticoagulation; however Eliquis was started that admission  - Normal LVEF by recent echo  - Also was on ASA, lipitor and Toprol - Now CP free with normal BP and controlled V rate - No signs or symptoms to suggest acute HF   PLAN/DISCUSSION:   Admit to cardiology  ASA  Start Plavix  Continue Heparin infusion  Hold Eliquis Continue beta blocker and statin Plan for cath on Monday -- sooner if unstable signs or symptoms  Wandra Mannan, MD Cardiology

## 2015-01-14 NOTE — Progress Notes (Signed)
Pt HR maintained 50's - 70's today. Pt now feels 50mg  will be too much and requested I ask MD to decrease. Spoke with Cecilie Kicks NP. Dose decreased to 25mg  BID. Will continue to monitor. Carroll Kinds RN

## 2015-01-14 NOTE — Progress Notes (Signed)
Encounter reviewed by Dr. Aundria Rud. Mammogram up to date and normal 11/22/14.

## 2015-01-15 LAB — CBC
HCT: 38.9 % (ref 36.0–46.0)
Hemoglobin: 12.8 g/dL (ref 12.0–15.0)
MCH: 33.5 pg (ref 26.0–34.0)
MCHC: 32.9 g/dL (ref 30.0–36.0)
MCV: 101.8 fL — ABNORMAL HIGH (ref 78.0–100.0)
Platelets: 444 10*3/uL — ABNORMAL HIGH (ref 150–400)
RBC: 3.82 MIL/uL — ABNORMAL LOW (ref 3.87–5.11)
RDW: 12.9 % (ref 11.5–15.5)
WBC: 7.5 10*3/uL (ref 4.0–10.5)

## 2015-01-15 LAB — APTT: aPTT: 90 seconds — ABNORMAL HIGH (ref 24–37)

## 2015-01-15 LAB — HEPARIN LEVEL (UNFRACTIONATED): Heparin Unfractionated: 0.86 IU/mL — ABNORMAL HIGH (ref 0.30–0.70)

## 2015-01-15 MED ORDER — SODIUM CHLORIDE 0.9 % WEIGHT BASED INFUSION: 1.0000 mL/kg/h | INTRAVENOUS | Status: DC

## 2015-01-15 MED ORDER — SODIUM CHLORIDE 0.9 % IJ SOLN: 3.0000 mL | INTRAMUSCULAR | Status: DC | PRN

## 2015-01-15 MED ORDER — SODIUM CHLORIDE 0.9 % IJ SOLN: 3.0000 mL | Freq: Two times a day (BID) | INTRAMUSCULAR | Status: DC

## 2015-01-15 MED ORDER — SODIUM CHLORIDE 0.9 % WEIGHT BASED INFUSION
3.0000 mL/kg/h | INTRAVENOUS | Status: DC
  Administered 2015-01-16: 3 mL/kg/h via INTRAVENOUS

## 2015-01-15 MED ORDER — ASPIRIN 81 MG PO CHEW
81.0000 mg | CHEWABLE_TABLET | ORAL | Status: DC
  Filled 2015-01-15: qty 1

## 2015-01-15 MED ORDER — SODIUM CHLORIDE 0.9 % IV SOLN: 250.0000 mL | INTRAVENOUS | Status: DC | PRN

## 2015-01-15 NOTE — Progress Notes (Signed)
Subjective:  No recurrent chest pain, no shortness of breath, no neurologic defects, questions answered about catheterization and troponin elevations.  Objective:  Vital Signs in the last 24 hours: BP 105/68 mmHg  Pulse 53  Temp(Src) 97.7 F (36.5 C) (Oral)  Resp 14  Ht 5\' 5"  (1.651 m)  Wt 71.124 kg (156 lb 12.8 oz)  BMI 26.09 kg/m2  SpO2 99%  LMP 11/08/2001 (Approximate)  Physical Exam: Pleasant female in no acute distress Lungs:  Clear Cardiac:  Irregular rhythm, normal S1 and S2, no S3, no murmur Abdomen:  Soft, nontender, no masses Extremities:  No edema present  Intake/Output from previous day: 08/06 0701 - 08/07 0700 In: 240 [P.O.:120; I.V.:120] Out: -   Weight Filed Weights   01/14/15 0107 01/15/15 0457  Weight: 72.077 kg (158 lb 14.4 oz) 71.124 kg (156 lb 12.8 oz)    Lab Results: Basic Metabolic Panel:  Recent Labs  01/13/15 1616  NA 136  K 5.1  CL 103  CO2 22  GLUCOSE 142*  BUN 15  CREATININE 0.89   CBC:  Recent Labs  01/13/15 1616 01/14/15 0254 01/15/15 0358  WBC 8.7 9.3 7.5  NEUTROABS 4.9  --   --   HGB 13.5 12.1 12.8  HCT 40.2 35.8* 38.9  MCV 102.8* 99.7 101.8*  PLT 462* 450* 444*   Cardiac Enzymes: Troponin (Point of Care Test)  Recent Labs  01/13/15 1635  TROPIPOC 0.00   Cardiac Panel (last 3 results)  Recent Labs  01/14/15 0254 01/14/15 0735 01/14/15 1405  TROPONINI 8.62* 5.80* 3.47*    Telemetry: Sinus rhythm  Assessment/Plan:  1.  Non-STEMI 2.  Recent stroke 3.  Chronic atrial fibrillation  Recommendations:  Continue heparin for the time being.  Office Eliquus now for 2 days. Cardiac catheterization procedure was discussed with the patient fully including risks of myocardial infarction, death, stroke, bleeding, arrhythmia, dye allergy, or renal insufficiency.  Possibility of intervention also discussed with patient including PCI and risks.  The patient understands and is willing to proceed.   Kerry Hough  MD [facility]  01/15/2015, 11:04 AM

## 2015-01-15 NOTE — Progress Notes (Signed)
ANTICOAGULATION CONSULT NOTE - [facility]  Indication: chest pain/ACS, rising troponin   Allergies  Allergen Reactions  . Lambs Quarters Hives, Itching and Nausea And Vomiting  . Lanolin Acid [Lanolin] Rash  . Other Itching and Rash    wool    Patient Measurements: ~72 kg  Vital Signs: Temp: 97.7 F (36.5 C) (08/07 0457) Temp Source: Oral (08/07 0457) BP: 105/68 mmHg (08/07 0457) Pulse Rate: 53 (08/07 0457)  Labs:  Recent Labs  01/13/15 1616  01/14/15 0254 01/14/15 0735 01/14/15 1405 01/15/15 0358  HGB 13.5  --  12.1  --   --  12.8  HCT 40.2  --  35.8*  --   --  38.9  PLT 462*  --  450*  --   --  444*  APTT  --   --  80* 80*  --  90*  HEPARINUNFRC  --   --   --  1.60*  --  0.86*  CREATININE 0.89  --   --   --   --   --   TROPONINI  --   < > 8.62* 5.80* 3.47*  --   < > = values in this interval not displayed.  Estimated Creatinine Clearance: 63.9 mL/min (by C-G formula based on Cr of 0.89).   Medical History: Past Medical History  Diagnosis Date  . Vitamin D deficiency 07/2006  . Diverticulitis 2009    diverticulitis  . Adenocarcinoma of breast 06/03    RIGHT  . Amenorrhea 09/01  . Migraine 09/01  . Infertility, female 09/01    4 SAB  . Amenorrhea   . Metrorrhagia   . Chemotherapy adverse reaction   . Hypertension   . Permanent atrial fibrillation 04/01/2014  . Stroke 01/03/15  . STD (sexually transmitted disease) 1976    Hx of HSV    Assessment: 64 y/o F with persistent afib presented to ED with CP and found to have NSTEMI. Pt recently admitted for a CVA on 01/03/15 (only taking ASA) w/ thrombolysis given and Eliquis started during that admission; last Eliquis dose 8/5 AM.   Drip currently running at 1000 units/hr. This morning APTT remains therapeutic at 90, HL 0.86 (still elevated from Eliquis).   No changes made to rate, no bleeding reported. Plt high 444, Hgb 12.8 and stable.  Goal of Therapy:  Heparin  level 0.3-0.7 units/ml aPTT 66-102 seconds Monitor platelets by anticoagulation protocol: Yes   Plan:  -Continue heparin gtt at 1000 units/hr -Repeat CBC/HL in AM -Last aPTT tomorrow  -Monitor for bleeding   Governor Specking, PharmD Clinical Pharmacy Resident Pager: [phone]  01/15/2015,7:45 AM

## 2015-01-16 ENCOUNTER — Encounter (HOSPITAL_COMMUNITY)
Admission: EM | Disposition: A | Discharge status: Home / Self Care | Payer: Self-pay | Source: Home / Self Care | Attending: Cardiology

## 2015-01-16 DIAGNOSIS — Z7901 Long term (current) use of anticoagulants: Secondary | ICD-10-CM

## 2015-01-16 DIAGNOSIS — I482 Chronic atrial fibrillation: Secondary | ICD-10-CM

## 2015-01-16 DIAGNOSIS — I2510 Atherosclerotic heart disease of native coronary artery without angina pectoris: Secondary | ICD-10-CM

## 2015-01-16 DIAGNOSIS — E785 Hyperlipidemia, unspecified: Secondary | ICD-10-CM

## 2015-01-16 HISTORY — PX: CARDIAC CATHETERIZATION: SHX172

## 2015-01-16 LAB — BASIC METABOLIC PANEL
Anion gap: 9 (ref 5–15)
BUN: 12 mg/dL (ref 6–20)
CO2: 26 mmol/L (ref 22–32)
Calcium: 9.5 mg/dL (ref 8.9–10.3)
Chloride: 102 mmol/L (ref 101–111)
Creatinine, Ser: 0.9 mg/dL (ref 0.44–1.00)
GFR calc Af Amer: >60 mL/min (ref >60)
GFR calc non Af Amer: >60 mL/min (ref >60)
Glucose, Bld: 110 mg/dL — ABNORMAL HIGH (ref 65–99)
Potassium: 4.8 mmol/L (ref 3.5–5.1)
Sodium: 137 mmol/L (ref 135–145)

## 2015-01-16 LAB — PROTIME-INR
INR: 1.07 (ref 0.00–1.49)
Prothrombin Time: 14.1 seconds (ref 11.6–15.2)

## 2015-01-16 LAB — CBC
HCT: 38.8 % (ref 36.0–46.0)
Hemoglobin: 12.8 g/dL (ref 12.0–15.0)
MCH: 33.4 pg (ref 26.0–34.0)
MCHC: 33 g/dL (ref 30.0–36.0)
MCV: 101.3 fL — ABNORMAL HIGH (ref 78.0–100.0)
Platelets: 439 10*3/uL — ABNORMAL HIGH (ref 150–400)
RBC: 3.83 MIL/uL — ABNORMAL LOW (ref 3.87–5.11)
RDW: 12.7 % (ref 11.5–15.5)
WBC: 8.4 10*3/uL (ref 4.0–10.5)

## 2015-01-16 LAB — VITAMIN B12: Vitamin B-12: 441 pg/mL (ref 180–914)

## 2015-01-16 LAB — APTT: aPTT: 87 seconds — ABNORMAL HIGH (ref 24–37)

## 2015-01-16 LAB — HEPARIN LEVEL (UNFRACTIONATED): Heparin Unfractionated: 0.59 IU/mL (ref 0.30–0.70)

## 2015-01-16 SURGERY — LEFT HEART CATH AND CORONARY ANGIOGRAPHY

## 2015-01-16 MED ORDER — SODIUM CHLORIDE 0.9 % WEIGHT BASED INFUSION: 1.0000 mL/kg/h | INTRAVENOUS | Status: AC

## 2015-01-16 MED ORDER — SODIUM CHLORIDE 0.9 % IJ SOLN: 3.0000 mL | INTRAMUSCULAR | Status: DC | PRN

## 2015-01-16 MED ORDER — IOHEXOL 350 MG/ML SOLN
INTRAVENOUS | Status: DC | PRN
  Administered 2015-01-16: 105 mL via INTRA_ARTERIAL

## 2015-01-16 MED ORDER — HEPARIN SODIUM (PORCINE) 1000 UNIT/ML IJ SOLN
INTRAMUSCULAR | Status: AC
  Filled 2015-01-16: qty 1

## 2015-01-16 MED ORDER — SODIUM CHLORIDE 0.9 % IV SOLN: 250.0000 mL | INTRAVENOUS | Status: DC | PRN

## 2015-01-16 MED ORDER — NITROGLYCERIN 0.4 MG/SPRAY TL SOLN
Status: DC | PRN
  Administered 2015-01-16: 1 via SUBLINGUAL

## 2015-01-16 MED ORDER — NITROGLYCERIN 1 MG/10 ML FOR IR/CATH LAB
INTRA_ARTERIAL | Status: DC | PRN
  Administered 2015-01-16: 200 ug via INTRACORONARY

## 2015-01-16 MED ORDER — NITROGLYCERIN 1 MG/10 ML FOR IR/CATH LAB
INTRA_ARTERIAL | Status: AC
  Filled 2015-01-16: qty 10

## 2015-01-16 MED ORDER — MIDAZOLAM HCL 2 MG/2ML IJ SOLN
INTRAMUSCULAR | Status: AC
Start: 2015-01-16 — End: 2015-01-16
  Filled 2015-01-16: qty 4

## 2015-01-16 MED ORDER — NITROGLYCERIN 0.4 MG/SPRAY TL SOLN
Status: AC
  Filled 2015-01-16: qty 4.9

## 2015-01-16 MED ORDER — SODIUM CHLORIDE 0.9 % IJ SOLN: 3.0000 mL | Freq: Two times a day (BID) | INTRAMUSCULAR | Status: DC

## 2015-01-16 MED ORDER — ENOXAPARIN SODIUM 40 MG/0.4ML ~~LOC~~ SOLN: 40.0000 mg | SUBCUTANEOUS | Status: DC

## 2015-01-16 MED ORDER — HEPARIN (PORCINE) IN NACL 2-0.9 UNIT/ML-% IJ SOLN
INTRAMUSCULAR | Status: AC
  Filled 2015-01-16: qty 1000

## 2015-01-16 MED ORDER — SODIUM CHLORIDE 0.9 % IV SOLN
INTRAVENOUS | Status: DC | PRN
  Administered 2015-01-16: 500 mL via INTRAVENOUS

## 2015-01-16 MED ORDER — VERAPAMIL HCL 2.5 MG/ML IV SOLN
INTRAVENOUS | Status: DC | PRN
  Administered 2015-01-16: 15:00:00 via INTRA_ARTERIAL

## 2015-01-16 MED ORDER — LIDOCAINE HCL (PF) 1 % IJ SOLN
INTRAMUSCULAR | Status: DC | PRN
  Administered 2015-01-16: 16:00:00

## 2015-01-16 MED ORDER — HEPARIN SODIUM (PORCINE) 1000 UNIT/ML IJ SOLN
INTRAMUSCULAR | Status: DC | PRN
  Administered 2015-01-16: 3500 [IU] via INTRAVENOUS

## 2015-01-16 MED ORDER — LIDOCAINE HCL (PF) 1 % IJ SOLN
INTRAMUSCULAR | Status: AC
  Filled 2015-01-16: qty 30

## 2015-01-16 MED ORDER — ATORVASTATIN CALCIUM 80 MG PO TABS
80.0000 mg | ORAL_TABLET | Freq: Every day | ORAL | Status: DC
  Administered 2015-01-16: 80 mg via ORAL
  Filled 2015-01-16: qty 1

## 2015-01-16 MED ORDER — FENTANYL CITRATE (PF) 100 MCG/2ML IJ SOLN
INTRAMUSCULAR | Status: DC | PRN
  Administered 2015-01-16: 50 ug via INTRAVENOUS

## 2015-01-16 MED ORDER — ACETAMINOPHEN 325 MG PO TABS: 650.0000 mg | ORAL_TABLET | ORAL | Status: DC | PRN

## 2015-01-16 MED ORDER — MIDAZOLAM HCL 2 MG/2ML IJ SOLN
INTRAMUSCULAR | Status: DC | PRN
  Administered 2015-01-16: 1 mg via INTRAVENOUS

## 2015-01-16 MED ORDER — FENTANYL CITRATE (PF) 100 MCG/2ML IJ SOLN
INTRAMUSCULAR | Status: AC
  Filled 2015-01-16: qty 4

## 2015-01-16 SURGICAL SUPPLY — 9 items
CATH INFINITI 5 FR JL3.5 (CATHETERS) ×2 IMPLANT
CATH INFINITI JR4 5F (CATHETERS) ×2 IMPLANT
DEVICE RAD COMP TR BAND LRG (VASCULAR PRODUCTS) ×2 IMPLANT
GLIDESHEATH SLEND A-KIT 6F 22G (SHEATH) ×2 IMPLANT
KIT HEART LEFT (KITS) ×2 IMPLANT
PACK CARDIAC CATHETERIZATION (CUSTOM PROCEDURE TRAY) ×2 IMPLANT
TRANSDUCER W/STOPCOCK (MISCELLANEOUS) ×2 IMPLANT
TUBING CIL FLEX 10 FLL-RA (TUBING) ×2 IMPLANT
WIRE SAFE-T 1.5MM-J .035X260CM (WIRE) ×2 IMPLANT

## 2015-01-16 NOTE — Progress Notes (Signed)
ANTICOAGULATION CONSULT NOTE - [facility] for Heparin  Indication: chest pain/ACS, NSTEMI  Allergies  Allergen Reactions  . Lambs Quarters Hives, Itching and Nausea And Vomiting  . Lanolin Acid [Lanolin] Rash  . Other Itching and Rash    wool    Patient Measurements: ~72 kg  Vital Signs: Temp: 97.5 F (36.4 C) (08/08 0500) Temp Source: Oral (08/08 0500) BP: 102/62 mmHg (08/08 0845) Pulse Rate: 71 (08/08 0845)  Labs:  Recent Labs  01/13/15 1616  01/14/15 0254 01/14/15 0735 01/14/15 1405 01/15/15 0358 01/16/15 0439  HGB 13.5  --  12.1  --   --  12.8 12.8  HCT 40.2  --  35.8*  --   --  38.9 38.8  PLT 462*  --  450*  --   --  444* 439*  APTT  --   < > 80* 80*  --  90* 87*  LABPROT  --   --   --   --   --   --  14.1  INR  --   --   --   --   --   --  1.07  HEPARINUNFRC  --   --   --  1.60*  --  0.86* 0.59  CREATININE 0.89  --   --   --   --   --  0.90  TROPONINI  --   < > 8.62* 5.80* 3.47*  --   --   < > = values in this interval not displayed.  Estimated Creatinine Clearance: 63.2 mL/min (by C-G formula based on Cr of 0.9).   Medical History: Past Medical History  Diagnosis Date  . Vitamin D deficiency 07/2006  . Diverticulitis 2009    diverticulitis  . Adenocarcinoma of breast 06/03    RIGHT  . Amenorrhea 09/01  . Migraine 09/01  . Infertility, female 09/01    4 SAB  . Amenorrhea   . Metrorrhagia   . Chemotherapy adverse reaction   . Hypertension   . Permanent atrial fibrillation 04/01/2014  . Stroke 01/03/15  . STD (sexually transmitted disease) 1976    Hx of HSV    Assessment: 64 y/o F with persistent afib presented to ED with CP and found to have NSTEMI. Pt recently admitted for a CVA on 01/03/15 (only taking ASA) w/ thrombolysis given and Eliquis started during that admission; last Eliquis dose 8/5 AM.   Heparin currently running at 1000 units/hr. This morning APTT remains therapeutic at 87.  Heparin level now correlating  at 0.59.   No changes made to rate, no bleeding reported. Plt high 444, Hgb 12.8 and stable.  Goal of Therapy:  Heparin level 0.3-0.7 units/ml aPTT 66-102 seconds Monitor platelets by anticoagulation protocol: Yes   Plan:  Continue heparin gtt at 1000 units/hr Daily heparin level and CBC Monitor for bleeding  Follow-up after cath  [facility], Pharm.D., BCPS Clinical Pharmacist Pager [phone] 01/16/2015 11:16 AM

## 2015-01-16 NOTE — Interval H&P Note (Signed)
Cath Lab Visit (complete for each Cath Lab visit)  Clinical Evaluation Leading to the Procedure:   ACS: Yes.    Non-ACS:    Anginal Classification: CCS IV  Anti-ischemic medical therapy: Maximal Therapy (2 or more classes of medications)  Non-Invasive Test Results: No non-invasive testing performed  Prior CABG: No previous CABG      History and Physical Interval Note:  01/16/2015 2:43 PM  Rhonda Paul  has presented today for surgery, with the diagnosis of cp  The various methods of treatment have been discussed with the patient and family. After consideration of risks, benefits and other options for treatment, the patient has consented to  Procedure(s): Left Heart Cath and Coronary Angiography (N/A) as a surgical intervention .  The patient's history has been reviewed, patient examined, no change in status, stable for surgery.  I have reviewed the patient's chart and labs.  Questions were answered to the patient's satisfaction.     Sinclair Grooms

## 2015-01-16 NOTE — Progress Notes (Signed)
Patient: Rhonda Paul / Admit Date: 01/13/2015 / Date of Encounter: 01/16/2015, 8:46 AM   Subjective: No CP or SOB.   Objective: Telemetry: rate controlled atrial fib Physical Exam: Blood pressure 102/62, pulse 71, temperature 97.5 F (36.4 C), temperature source Oral, resp. rate 16, height 5\' 5"  (1.651 m), weight 156 lb 11.2 oz (71.079 kg), last menstrual period 11/08/2001, SpO2 99 %. General: Well developed, well nourished WF in no acute distress. Head: Normocephalic, atraumatic, sclera non-icteric, no xanthomas, nares are without discharge. Neck: Negative for carotid bruits. JVP not elevated. Lungs: Clear bilaterally to auscultation without wheezes, rales, or rhonchi. Breathing is unlabored. Heart: Irregularly irregular, rate controlled, S1 S2 without murmurs, rubs, or gallops.  Abdomen: Soft, non-tender, non-distended with normoactive bowel sounds. No rebound/guarding. Extremities: No clubbing or cyanosis. No edema. Distal pedal pulses are 2+ and equal bilaterally. Neuro: Alert and oriented X 3. Moves all extremities spontaneously. Psych:  Responds to questions appropriately with a normal affect.   Intake/Output Summary (Last 24 hours) at 01/16/15 0846 Last data filed at 01/16/15 0500  Gross per 24 hour  Intake    343 ml  Output      0 ml  Net    343 ml    Inpatient Medications:  . aspirin  81 mg Oral Pre-Cath  . aspirin EC  81 mg Oral Daily  . atorvastatin  40 mg Oral q1800  . clopidogrel  75 mg Oral Q breakfast  . levothyroxine  50 mcg Oral QAC breakfast  . metoprolol tartrate  25 mg Oral BID  . sodium chloride  3 mL Intravenous Q12H   Infusions:  . sodium chloride 1 mL/kg/hr (01/16/15 0655)  . heparin 1,000 Units/hr (01/15/15 2122)    Labs:  Recent Labs  01/13/15 1616 01/16/15 0439  NA 136 137  K 5.1 4.8  CL 103 102  CO2 22 26  GLUCOSE 142* 110*  BUN 15 12  CREATININE 0.89 0.90  CALCIUM 9.8 9.5   No results for input(s): AST, ALT, ALKPHOS, BILITOT,  PROT, ALBUMIN in the last 72 hours.  Recent Labs  01/13/15 1616  01/15/15 0358 01/16/15 0439  WBC 8.7  < > 7.5 8.4  NEUTROABS 4.9  --   --   --   HGB 13.5  < > 12.8 12.8  HCT 40.2  < > 38.9 38.8  MCV 102.8*  < > 101.8* 101.3*  PLT 462*  < > 444* 439*  < > = values in this interval not displayed.  Recent Labs  01/13/15 2203 01/14/15 0254 01/14/15 0735 01/14/15 1405  TROPONINI 6.29* 8.62* 5.80* 3.47*    Radiology/Studies:  Dg Chest 2 View  01/13/2015   CLINICAL DATA:  Centralized inferior chest pain since this morning, vomiting this afternoon, stroke last week, history hypertension, atrial fibrillation  EXAM: CHEST  2 VIEW  COMPARISON:  01/05/2015  FINDINGS: Mild enlargement of cardiac silhouette.  Mediastinal contours and pulmonary vascularity normal.  Lungs mildly hyperinflated but clear.  No pleural effusion or pneumothorax.  No acute osseous findings.  Surgical clips RIGHT breast and axilla.  IMPRESSION: Mild enlargement of cardiac silhouette.  No acute abnormalities.   Electronically Signed   By: Lavonia Dana M.D.   On: 01/13/2015 17:02     Assessment and Plan   1. NSTEMI (no prior hx of CAD) - peak trop 8.6 2. Recent stroke 01/03/15 treated with TPA and mechanical thrombectomy, sent home on Eliquis 3. Chronic atrial fib, now on Eliquis as of recently  4. HTN, controlled 5. Hyperlipidemia - recent LDL 142 - recently started on Lipitor 6. Thrombocytosis - will follow  Eliquis held since admission - covered with heparin per pharmacy. Also started on aspirin and Plavix this admission by fellow. She declined aspirin this AM. Will discuss tentative med plan with MD given very recent stroke - triple anticoagulant therapy will need to be broached with caution. Plan cardiac cath today. If + CAD would increase Lipitor to 80mg  qpm.  Signed, Melina Copa PA-C Pager: [phone]   Patient seen and examined. Agree with assessment and plan. No recurrent chest pain since admission.  She is  s/p a thrombotic CVA on 01/03/15 and was started on eliquis at that time.  Last dose was 8/5; started on Plavix this admission.  Trop positive for NSTEMI.  Now on atorvastatin for hyperlipidememia. The risks and benefits of a cardiac catheterization including, but not limited to, death, stroke, MI, kidney damage and bleeding were discussed with the patient who indicates understanding and agrees to proceed.    Troy Sine, MD, [facility] 01/16/2015 9:40 AM

## 2015-01-16 NOTE — H&P (View-Only) (Signed)
Patient: Rhonda Paul / Admit Date: 01/13/2015 / Date of Encounter: 01/16/2015, 8:46 AM   Subjective: No CP or SOB.   Objective: Telemetry: rate controlled atrial fib Physical Exam: Blood pressure 102/62, pulse 71, temperature 97.5 F (36.4 C), temperature source Oral, resp. rate 16, height 5\' 5"  (1.651 m), weight 156 lb 11.2 oz (71.079 kg), last menstrual period 11/08/2001, SpO2 99 %. General: Well developed, well nourished WF in no acute distress. Head: Normocephalic, atraumatic, sclera non-icteric, no xanthomas, nares are without discharge. Neck: Negative for carotid bruits. JVP not elevated. Lungs: Clear bilaterally to auscultation without wheezes, rales, or rhonchi. Breathing is unlabored. Heart: Irregularly irregular, rate controlled, S1 S2 without murmurs, rubs, or gallops.  Abdomen: Soft, non-tender, non-distended with normoactive bowel sounds. No rebound/guarding. Extremities: No clubbing or cyanosis. No edema. Distal pedal pulses are 2+ and equal bilaterally. Neuro: Alert and oriented X 3. Moves all extremities spontaneously. Psych:  Responds to questions appropriately with a normal affect.   Intake/Output Summary (Last 24 hours) at 01/16/15 0846 Last data filed at 01/16/15 0500  Gross per 24 hour  Intake    343 ml  Output      0 ml  Net    343 ml    Inpatient Medications:  . aspirin  81 mg Oral Pre-Cath  . aspirin EC  81 mg Oral Daily  . atorvastatin  40 mg Oral q1800  . clopidogrel  75 mg Oral Q breakfast  . levothyroxine  50 mcg Oral QAC breakfast  . metoprolol tartrate  25 mg Oral BID  . sodium chloride  3 mL Intravenous Q12H   Infusions:  . sodium chloride 1 mL/kg/hr (01/16/15 0655)  . heparin 1,000 Units/hr (01/15/15 2122)    Labs:  Recent Labs  01/13/15 1616 01/16/15 0439  NA 136 137  K 5.1 4.8  CL 103 102  CO2 22 26  GLUCOSE 142* 110*  BUN 15 12  CREATININE 0.89 0.90  CALCIUM 9.8 9.5   No results for input(s): AST, ALT, ALKPHOS, BILITOT,  PROT, ALBUMIN in the last 72 hours.  Recent Labs  01/13/15 1616  01/15/15 0358 01/16/15 0439  WBC 8.7  < > 7.5 8.4  NEUTROABS 4.9  --   --   --   HGB 13.5  < > 12.8 12.8  HCT 40.2  < > 38.9 38.8  MCV 102.8*  < > 101.8* 101.3*  PLT 462*  < > 444* 439*  < > = values in this interval not displayed.  Recent Labs  01/13/15 2203 01/14/15 0254 01/14/15 0735 01/14/15 1405  TROPONINI 6.29* 8.62* 5.80* 3.47*    Radiology/Studies:  Dg Chest 2 View  01/13/2015   CLINICAL DATA:  Centralized inferior chest pain since this morning, vomiting this afternoon, stroke last week, history hypertension, atrial fibrillation  EXAM: CHEST  2 VIEW  COMPARISON:  01/05/2015  FINDINGS: Mild enlargement of cardiac silhouette.  Mediastinal contours and pulmonary vascularity normal.  Lungs mildly hyperinflated but clear.  No pleural effusion or pneumothorax.  No acute osseous findings.  Surgical clips RIGHT breast and axilla.  IMPRESSION: Mild enlargement of cardiac silhouette.  No acute abnormalities.   Electronically Signed   By: Lavonia Dana M.D.   On: 01/13/2015 17:02     Assessment and Plan   1. NSTEMI (no prior hx of CAD) - peak trop 8.6 2. Recent stroke 01/03/15 treated with TPA and mechanical thrombectomy, sent home on Eliquis 3. Chronic atrial fib, now on Eliquis as of recently  4. HTN, controlled 5. Hyperlipidemia - recent LDL 142 - recently started on Lipitor 6. Thrombocytosis - will follow  Eliquis held since admission - covered with heparin per pharmacy. Also started on aspirin and Plavix this admission by fellow. She declined aspirin this AM. Will discuss tentative med plan with MD given very recent stroke - triple anticoagulant therapy will need to be broached with caution. Plan cardiac cath today. If + CAD would increase Lipitor to 80mg  qpm.  Signed, Melina Copa PA-C Pager: [phone]   Patient seen and examined. Agree with assessment and plan. No recurrent chest pain since admission.  She is  s/p a thrombotic CVA on 01/03/15 and was started on eliquis at that time.  Last dose was 8/5; started on Plavix this admission.  Trop positive for NSTEMI.  Now on atorvastatin for hyperlipidememia. The risks and benefits of a cardiac catheterization including, but not limited to, death, stroke, MI, kidney damage and bleeding were discussed with the patient who indicates understanding and agrees to proceed.    Troy Sine, MD, [facility] 01/16/2015 9:40 AM

## 2015-01-17 ENCOUNTER — Encounter (HOSPITAL_COMMUNITY): Payer: Self-pay | Admitting: Interventional Cardiology

## 2015-01-17 DIAGNOSIS — Z7901 Long term (current) use of anticoagulants: Secondary | ICD-10-CM

## 2015-01-17 LAB — CBC
HCT: 38.8 % (ref 36.0–46.0)
Hemoglobin: 13 g/dL (ref 12.0–15.0)
MCH: 33.7 pg (ref 26.0–34.0)
MCHC: 33.5 g/dL (ref 30.0–36.0)
MCV: 100.5 fL — ABNORMAL HIGH (ref 78.0–100.0)
Platelets: 417 10*3/uL — ABNORMAL HIGH (ref 150–400)
RBC: 3.86 MIL/uL — ABNORMAL LOW (ref 3.87–5.11)
RDW: 12.6 % (ref 11.5–15.5)
WBC: 7.6 10*3/uL (ref 4.0–10.5)

## 2015-01-17 LAB — LIPID PANEL
Cholesterol: 234 mg/dL — ABNORMAL HIGH (ref 0–200)
HDL: 50 mg/dL (ref >40)
LDL Cholesterol: 154 mg/dL — ABNORMAL HIGH (ref 0–99)
Total CHOL/HDL Ratio: 4.7 RATIO
Triglycerides: 148 mg/dL (ref <150)
VLDL: 30 mg/dL (ref 0–40)

## 2015-01-17 LAB — BASIC METABOLIC PANEL
Anion gap: 8 (ref 5–15)
BUN: 8 mg/dL (ref 6–20)
CO2: 25 mmol/L (ref 22–32)
Calcium: 9.5 mg/dL (ref 8.9–10.3)
Chloride: 104 mmol/L (ref 101–111)
Creatinine, Ser: 0.9 mg/dL (ref 0.44–1.00)
GFR calc Af Amer: >60 mL/min (ref >60)
GFR calc non Af Amer: >60 mL/min (ref >60)
Glucose, Bld: 103 mg/dL — ABNORMAL HIGH (ref 65–99)
Potassium: 4.2 mmol/L (ref 3.5–5.1)
Sodium: 137 mmol/L (ref 135–145)

## 2015-01-17 LAB — HEPATIC FUNCTION PANEL
ALT: 41 U/L (ref 14–54)
AST: 40 U/L (ref 15–41)
Albumin: 3.6 g/dL (ref 3.5–5.0)
Alkaline Phosphatase: 56 U/L (ref 38–126)
Bilirubin, Direct: 0.1 mg/dL (ref 0.1–0.5)
Indirect Bilirubin: 0.8 mg/dL (ref 0.3–0.9)
Total Bilirubin: 0.9 mg/dL (ref 0.3–1.2)
Total Protein: 7 g/dL (ref 6.5–8.1)

## 2015-01-17 LAB — FOLATE RBC
Folate, Hemolysate: 454.8 ng/mL
Folate, RBC: 1226 ng/mL (ref >498)
Hematocrit: 37.1 % (ref 34.0–46.6)

## 2015-01-17 LAB — IPS PAP TEST WITH HPV

## 2015-01-17 MED ORDER — METOPROLOL SUCCINATE ER 50 MG PO TB24: 50.0000 mg | ORAL_TABLET | Freq: Every day | ORAL | Status: DC

## 2015-01-17 MED ORDER — NITROGLYCERIN 0.4 MG SL SUBL: 0.4000 mg | SUBLINGUAL_TABLET | SUBLINGUAL | Status: DC | PRN

## 2015-01-17 MED ORDER — ATORVASTATIN CALCIUM 80 MG PO TABS: 80.0000 mg | ORAL_TABLET | Freq: Every day | ORAL | Status: DC

## 2015-01-17 MED ORDER — ASPIRIN 81 MG PO TBEC: 81.0000 mg | DELAYED_RELEASE_TABLET | Freq: Every day | ORAL | Status: DC

## 2015-01-17 MED ORDER — ISOSORBIDE MONONITRATE ER 30 MG PO TB24: 30.0000 mg | ORAL_TABLET | Freq: Every day | ORAL | Status: DC

## 2015-01-17 NOTE — Discharge Summary (Signed)
CARDIOLOGY DISCHARGE SUMMARY   Patient ID: Rhonda Paul MRN: [id number] DOB/AGE: 1951/03/12 64 y.o.  Admit date: 01/13/2015 Discharge date: 01/17/2015  PCP: No PCP Per Patient Primary Cardiologist: Dr Tamala Julian  Primary Discharge Diagnosis:  NSTEMI (non-ST elevated myocardial infarction) - med rx, culprit lesion 75% OM1 Secondary Discharge Diagnosis:    Permanent atrial fibrillation   Chronic anticoagulation - Eliquis  Procedures: Cardiac catheterization, coronary arteriogram, left ventriculogram  Hospital Course: Rhonda Paul is a 64 y.o. female with a history of afib, HTN, HL, and CVA 12/2014 (d/c on Eliquis). She had chest pain and came to the hospital, where her troponin was elevated and she was admitted for further evaluation and treatment.  Her cardiac troponin remained elevated, peaking at 8.62. She was pain-free on ASA, high-dose statin, and metoprolol. She was loaded with Plavix. Her atrial fibrillation is permanent, but because of the need for cath, the Eliquis was held and she was started on heparin.   This patients CHA2DS2-VASc Score and unadjusted Ischemic Stroke Rate (% per year) is equal to 4.8 % stroke rate/year from a score of 4 Above score calculated as 1 point each if present [CHF, HTN, DM, Vascular=MI/PAD/Aortic Plaque, Age if 65-74, or Female], 2 points each if present [Age > 75, or Stroke/TIA/TE]  She was seen by cardiac rehab and ambulated well with them. Because of the spasm, she will be started on low-dose Imdur, up-titrate as tolerated.   On 08/09, she was seen by Dr Claiborne Billings and all data were reviewed. VS were stable and she was having no chest pain or SOB. No further inpatient workup was indicated and she is considered stable for discharge, to follow up as an outpatient.   Labs:   Lab Results  Component Value Date   WBC 7.6 01/17/2015   HGB 13.0 01/17/2015   HCT 38.8 01/17/2015   MCV 100.5* 01/17/2015   PLT 417* 01/17/2015     Recent Labs Lab  01/17/15 0442  NA 137  K 4.2  CL 104  CO2 25  BUN 8  CREATININE 0.90  CALCIUM 9.5  PROT 7.0  BILITOT 0.9  ALKPHOS 56  ALT 41  AST 40  GLUCOSE 103*    Recent Labs  01/14/15 1405  TROPONINI 3.47*   Lipid Panel     Component Value Date/Time   CHOL 234* 01/17/2015 0442   TRIG 148 01/17/2015 0442   HDL 50 01/17/2015 0442   CHOLHDL 4.7 01/17/2015 0442   VLDL 30 01/17/2015 0442   LDLCALC 154* 01/17/2015 0442    B NATRIURETIC PEPTIDE  Date/Time Value Ref Range Status  01/14/2015 02:54 AM 434.2* 0.0 - 100.0 pg/mL Final    Recent Labs  01/16/15 0439  INR 1.07      Radiology: Dg Chest 2 View 01/13/2015   CLINICAL DATA:  Centralized inferior chest pain since this morning, vomiting this afternoon, stroke last week, history hypertension, atrial fibrillation  EXAM: CHEST  2 VIEW  COMPARISON:  01/05/2015  FINDINGS: Mild enlargement of cardiac silhouette.  Mediastinal contours and pulmonary vascularity normal.  Lungs mildly hyperinflated but clear.  No pleural effusion or pneumothorax.  No acute osseous findings.  Surgical clips RIGHT breast and axilla.  IMPRESSION: Mild enlargement of cardiac silhouette.  No acute abnormalities.   Electronically Signed   By: Lavonia Dana M.D.   On: 01/13/2015 17:02   Ir Percutaneous Art Thrombectomy/infusion Intracranial Inc Diag Angio 01/06/2015   CLINICAL DATA:  Left-sided weakness, right gaze deviation.  Hyperdense right MCA sign.  EXAM: IR PERCUTANEOUS ART THORMBECTOMY/INFUSION INTRACRANIAL INCLUDE DIAG ANGIO RIGHT COMMON CAROTID ARTERIOGRAM FOLLOWED BY ENDOVASCULAR COMPLETE REVASCULARIZATION OF OCCLUDED RIGHT MIDDLE CEREBRAL ARTERY PROXIMALLY USING SUPER SELECTIVE INTRACRANIAL INTRA-ARTERIAL TPA, AND 1 PASS WITH THE SOLITAIRE FR 4 MM X 40 MM STENT RETRIEVAL DEVICE. RIGHT VERTEBRAL ARTERY ANGIOGRAM  PROCEDURE: Contrast: 67mL OMNIPAQUE IOHEXOL 300 MG/ML  SOLN   IMPRESSION: Status post endovascular complete revascularization of occluded right middle  cerebral artery proximal M1 segment using approximately 7 mg of super selective intracranial intra-arterial tPA followed by 1 pass with the 4 mm x 40 mm Solitaire FR stent retrieval device achieving a TICI 3 revascularization.   Electronically Signed   By: Luanne Bras M.D.   On: 01/04/2015 10:01   Cath: 01/16/2015 1. 1st Mrg lesion, 75% stenosed. 2. Prox RCA lesion, 30% stenosed. 3. The left ventricular systolic function is normal.  Moderately severe first obtuse marginal stenosis. The marginal is a relatively small vessel in distribution and diameter. This likely represents the culprit for the patient's presentation.  30% proximal RCA narrowing with more severe narrowing on catheter engagement due to spasm. The spasm was relieved with intracoronary nitroglycerin.  Widely patent LAD  Normal LV function Recommendations:  Sublingual nitroglycerin for chest discomfort  Consider low-dose long-acting nitrates if recurring episodes of chest discomfort.  Eligible for discharge in a.m. assuming no complications.  EKG: 01/17/2015 Atrial fib, rate controlled  FOLLOW UP PLANS AND APPOINTMENTS Allergies  Allergen Reactions  . Lambs Quarters Hives, Itching and Nausea And Vomiting  . Lanolin Acid [Lanolin] Rash  . Other Itching and Rash    wool     Medication List    TAKE these medications        acetaminophen 500 MG tablet  Commonly known as:  TYLENOL  Take 1,000 mg by mouth every 6 (six) hours as needed for moderate pain or headache.     apixaban 5 MG Tabs tablet  Commonly known as:  ELIQUIS  Take 1 tablet (5 mg total) by mouth 2 (two) times daily.     aspirin 81 MG EC tablet  Take 1 tablet (81 mg total) by mouth daily.     atorvastatin 80 MG tablet  Commonly known as:  LIPITOR  Take 1 tablet (80 mg total) by mouth daily at 6 PM.  Notes to Patient:  OK to take 2 of the 40 mg tablets instead of the 80 mg tablets to use up the 40 mg.     isosorbide mononitrate 30 MG  24 hr tablet  Commonly known as:  IMDUR  Take 1 tablet (30 mg total) by mouth daily.     levothyroxine 50 MCG tablet  Commonly known as:  SYNTHROID, LEVOTHROID  Take 1 tablet (50 mcg total) by mouth at bedtime.     metoprolol succinate 50 MG 24 hr tablet  Commonly known as:  TOPROL-XL  Take 1 tablet (50 mg total) by mouth daily. Take 1 and 1/2 tablet (75mg ) Daily     nitroGLYCERIN 0.4 MG SL tablet  Commonly known as:  NITROSTAT  Place 1 tablet (0.4 mg total) under the tongue every 5 (five) minutes as needed for chest pain.        Discharge Instructions    Amb Referral to Cardiac Rehabilitation    Complete by:  As directed   Congestive Heart Failure: If diagnosis is Heart Failure, patient MUST meet each of the CMS criteria: 1. Left Ventricular Ejection Fraction </= 35% 2.  NYHA class II-IV symptoms despite being on optimal heart failure therapy for at least 6 weeks. 3. Stable = have not had a recent (<6 weeks) or planned (<6 months) major cardiovascular hospitalization or procedure  Program Details: - Physician supervised classes - 1-3 classes per week over a 12-18 week period, generally for a total of 36 sessions  Physician Certification: I certify that the above Cardiac Rehabilitation treatment is medically necessary and is medically approved by me for treatment of this patient. The patient is willing and cooperative, able to ambulate and medically stable to participate in exercise rehabilitation. The participant's progress and Individualized Treatment Plan will be reviewed by the Medical Director, Cardiac Rehab staff and as indicated by the Referring/Ordering Physician.  Diagnosis:  Myocardial Infarction     Diet - low sodium heart healthy    Complete by:  As directed      Increase activity slowly    Complete by:  As directed           Follow-up Information    Follow up with Sinclair Grooms, MD.   Specialty:  Cardiology   Why:  The office will call. You need to be seen  once before follow-up with Dr Tamala Julian in September.   Contact information:   [address] [address] [phone]       BRING ALL MEDICATIONS WITH YOU TO FOLLOW UP APPOINTMENTS  Time spent with patient to include physician time: 51 min Signed: Rosaria Ferries, PA-C 01/17/2015, 1:48 PM Co-Sign MD

## 2015-01-17 NOTE — Progress Notes (Signed)
Patient Name: Rhonda Paul Date of Encounter: 01/17/2015  Principal Problem:   NSTEMI (non-ST elevated myocardial infarction) Active Problems:   Permanent atrial fibrillation   Primary Cardiologist: Dr Tamala Julian  Patient Profile:  64 yo female w/ hx afib, HTN, HL, CVA 12/2014 (d/c on Eliquis), was admitted 08/06 with NSTEMI.  SUBJECTIVE: No more chest pain, no SOB, no other symptoms. Ready for d/c   OBJECTIVE Filed Vitals:   01/16/15 1820 01/16/15 1939 01/16/15 2017 01/17/15 0500  BP: 118/70 126/72 120/69 108/64  Pulse: 69 76 62 78  Temp:  97.7 F (36.5 C)  98.3 F (36.8 C)  TempSrc:  Oral  Oral  Resp:  17  18  Height:      Weight:    166 lb 1.6 oz (75.342 kg)  SpO2: 98% 99% 98% 98%    Intake/Output Summary (Last 24 hours) at 01/17/15 1247 Last data filed at 01/17/15 0737  Gross per 24 hour  Intake    240 ml  Output      0 ml  Net    240 ml   Filed Weights   01/15/15 1940 01/16/15 0500 01/17/15 0500  Weight: 156 lb 12.7 oz (71.12 kg) 156 lb 11.2 oz (71.079 kg) 166 lb 1.6 oz (75.342 kg)    PHYSICAL EXAM General: Well developed, well nourished, female in no acute distress. Head: Normocephalic, atraumatic.  Neck: Supple without bruits, JVD not elevated. Lungs:  Resp regular and unlabored, CTA. Heart: RRR, S1, S2, no S3, S4, or murmur; no rub. Abdomen: Soft, non-tender, non-distended, BS + x 4.  Extremities: No clubbing, cyanosis, edema. Ecchymosis R radial site, no hematoma Neuro: Alert and oriented X 3. Moves all extremities spontaneously. Psych: Normal affect.  LABS: CBC: Recent Labs  01/16/15 0439 01/17/15 0442  WBC 8.4 7.6  HGB 12.8 13.0  HCT 38.8 38.8  MCV 101.3* 100.5*  PLT 439* 417*   INR: Recent Labs  01/16/15 0439  INR 1.93   Basic Metabolic Panel: Recent Labs  01/16/15 0439 01/17/15 0442  NA 137 137  K 4.8 4.2  CL 102 104  CO2 26 25  GLUCOSE 110* 103*  BUN 12 8  CREATININE 0.90 0.90  CALCIUM 9.5 9.5   Liver Function  Tests:No results for input(s): AST, ALT, ALKPHOS, BILITOT, PROT, ALBUMIN in the last 72 hours. Cardiac Enzymes: Recent Labs  01/14/15 1405  TROPONINI 3.47*   BNP:  B NATRIURETIC PEPTIDE  Date/Time Value Ref Range Status  01/14/2015 02:54 AM 434.2* 0.0 - 100.0 pg/mL Final   Anemia Panel: Recent Labs  01/16/15 1111  VITAMINB12 441    TELE:  Atrial fib, no pauses > 2.5 sec, HR sustained 40s at times      Cath:  01/16/2015 1. 1st Mrg lesion, 75% stenosed. 2. Prox RCA lesion, 30% stenosed. 3. The left ventricular systolic function is normal.  Moderately severe first obtuse marginal stenosis. The marginal is a relatively small vessel in distribution and diameter. This likely represents the culprit for the patient's presentation.  30% proximal RCA narrowing with more severe narrowing on catheter engagement due to spasm. The spasm was relieved with intracoronary nitroglycerin.  Widely patent LAD  Normal LV function Recommendations:  Sublingual nitroglycerin for chest discomfort  Consider low-dose long-acting nitrates if recurring episodes of chest discomfort.  Eligible for discharge in a.m. assuming no complications.   Current Medications:  . aspirin EC  81 mg Oral Daily  . atorvastatin  80 mg Oral q1800  .  clopidogrel  75 mg Oral Q breakfast  . enoxaparin (LOVENOX) injection  40 mg Subcutaneous Q24H  . levothyroxine  50 mcg Oral QAC breakfast  . metoprolol tartrate  25 mg Oral BID  . sodium chloride  3 mL Intravenous Q12H      ASSESSMENT AND PLAN: Principal Problem:   NSTEMI (non-ST elevated myocardial infarction) - Cath report above, med rx - on ASA, statin, Plavix - on BB, but dose is lower than at home.    Coronary artery spasm - Pt had culprit lesion in OM but also had spasm in RCA - currently ambulating without chest pain or SOB - give SL NTG at dc, may need long-acting nitrates if sx not controlled.     Active Problems:   Permanent atrial  fibrillation - HR 30s at times but not sustained, no pauses > 2.5 sec on Lopressor 25 mg bid - PTA on Toprol XL 75 mg daily - change to Toprol XL 50 mg daily at d/c     Hyperlipidemia - rechecking lipid profile and Hepatic function - continue high-dose statin    Chronic anticoagulation - PTA on Eliquis only. Now on ASA and Plavix. - with recent CVA, would resume Eliquis - per pt, Dr Tamala Julian recommended ASA and Eliquis at d/c, MD advise.    Otherwise, continue home rx. Recent OP check of TSH, continue home rx.     OK for d/c today    Signed, Rosaria Ferries , PA-C 12:47 PM 01/17/2015   Patient seen and examined. Agree with assessment and plan. Angio findings reviewed. Will dc today on reduced Toprol dose and add low dose Imdur 30 mg initially due to potential spasm in etiology of NSTEMI. DC Plavix. Resume eliquis and with CAD ASA 81 mg. Now on high potency statin in attempt to induce plaque regression. Will dc today. F/u Dr. Tamala Julian.   Troy Sine, MD, [facility] 01/17/2015 1:40 PM

## 2015-01-17 NOTE — Discharge Instructions (Signed)
PLEASE REMEMBER TO BRING ALL OF YOUR MEDICATIONS TO EACH OF YOUR FOLLOW-UP OFFICE VISITS. ° °PLEASE ATTEND ALL SCHEDULED FOLLOW-UP APPOINTMENTS.  ° °Activity: Increase activity slowly as tolerated. You may shower, but no soaking baths (or swimming) for 1 week. No driving for 1 week. No lifting over 5 lbs for 2 weeks. No sexual activity for 1 week.  ° °You May Return to Work: in 3 weeks (if applicable) ° °Wound Care: You may wash cath site gently with soap and water. Keep cath site clean and dry. If you notice pain, swelling, bleeding or pus at your cath site, please call [phone]. ° ° ° °Cardiac Cath Site Care °Refer to this sheet in the next few weeks. These instructions provide you with information on caring for yourself after your procedure. Your caregiver may also give you more specific instructions. Your treatment has been planned according to current medical practices, but problems sometimes occur. Call your caregiver if you have any problems or questions after your procedure. °HOME CARE INSTRUCTIONS °· You may shower 24 hours after the procedure. Remove the bandage (dressing) and gently wash the site with plain soap and water. Gently pat the site dry.  °· Do not apply powder or lotion to the site.  °· Do not sit in a bathtub, swimming pool, or whirlpool for 5 to 7 days.  °· No bending, squatting, or lifting anything over 10 pounds (4.5 kg) as directed by your caregiver.  °· Inspect the site at least twice daily.  °· Do not drive home if you are discharged the same day of the procedure. Have someone else drive you.  °· You may drive 24 hours after the procedure unless otherwise instructed by your caregiver.  °What to expect: °· Any bruising will usually fade within 1 to 2 weeks.  °· Blood that collects in the tissue (hematoma) may be painful to the touch. It should usually decrease in size and tenderness within 1 to 2 weeks.  °SEEK IMMEDIATE MEDICAL CARE IF: °· You have unusual pain at the site or down the  affected limb.  °· You have redness, warmth, swelling, or pain at the site.  °· You have drainage (other than a small amount of blood on the dressing).  °· You have chills.  °· You have a fever or persistent symptoms for more than 72 hours.  °· You have a fever and your symptoms suddenly get worse.  °· Your leg becomes pale, cool, tingly, or numb.  °· You have heavy bleeding from the site. Hold pressure on the site.  °Document Released: 06/29/2010 Document Revised: 05/16/2011 Document Reviewed:  ° °

## 2015-01-17 NOTE — Progress Notes (Signed)
CARDIAC REHAB PHASE I   PRE:  Rate/Rhythm: 63 afib    BP: sitting 100/57    SaO2:   MODE:  Ambulation: 550 ft   POST:  Rate/Rhythm: 83 afib    BP: sitting 103/66     SaO2:   Tolerated well, no c/o. Denied CP or dizzy. Ed completed with pt and she requests a referral be sent to [place], [place], ACSM 01/17/2015 9:34 AM

## 2015-01-18 ENCOUNTER — Telehealth: Payer: Self-pay | Admitting: Interventional Cardiology

## 2015-01-18 ENCOUNTER — Telehealth: Payer: Self-pay | Admitting: Cardiology

## 2015-01-18 NOTE — Telephone Encounter (Signed)
TCM w/ Lurena Joiner 8/24 @ 1:30 pm per Rhonda/staff message

## 2015-01-18 NOTE — Telephone Encounter (Signed)
New Message       Pt calling stating that she was just recently in the hospital and they changed some of her medications and she wants to make sure it is ok w/ Dr. Tamala Julian. Please call back and advise.

## 2015-01-18 NOTE — Telephone Encounter (Signed)
I made the dose change and it will be necessary to allow the obstructed artery to heal.

## 2015-01-18 NOTE — Telephone Encounter (Signed)
**Note De-Identified Rhonda Paul Obfuscation** Patient contacted regarding discharge from [facility] on 01/17/15.  Patient understands to follow up with provider Kerin Ransom on 02/01/15 at 1:30 at [facility] location. Patient understands discharge instructions? Yes Patient understands medications and regiment?  Yes but questions increase in her dose of Lipitor while in the hospital from 40 mg daily to 80 mg daily. She is requesting that I send Dr Tamala Julian a message asking if he agrees with dose change which I have done. She is aware that we will call her back with his recommendation.  Patient understands to bring all medications to this visit? yes

## 2015-01-18 NOTE — Telephone Encounter (Signed)
The pt questions the increase in her dose of Lipitor while in the hospital from 40 mg daily to 80 mg daily.  She is requesting that I send Dr Tamala Julian a message asking if he agrees with dose change.  She is aware that we will call her back with his recommendation when available.

## 2015-01-19 NOTE — Telephone Encounter (Signed)
Called to give pt Dr.Smith's response.lmtcb 

## 2015-01-19 NOTE — Telephone Encounter (Signed)
Pt aware that Dr.Smith is the physician that made the increase in her Atorvastatin from 40mg  to 80mg  qd.  Pt sts that she read the insert that came with her Rx for Atorvastatin. It indicated a possible interaction between Atorvastatin and Levothyroxine. Adv her I will talk with one our pharmacist and call her back.   Adv her that I spoke with our pharmacist Megan, it is ok for pt to be on Atorvastatin and Levothyroxine. Pt verbalized understanding.

## 2015-01-19 NOTE — Telephone Encounter (Signed)
Pt returning [place] phone call

## 2015-01-23 NOTE — Patient Outreach (Signed)
[name]) Care Management  01/23/2015  Rhonda Paul Dec 20, 1950 [id number]   Triggered RED on EMMI Stroke Dashboard, assigned [name], RN to outreach.  Thanks, [name]. [name], [name] Assistant Phone: [phone] Fax: [phone]

## 2015-01-24 ENCOUNTER — Other Ambulatory Visit (HOSPITAL_COMMUNITY): Payer: Self-pay | Admitting: Interventional Radiology

## 2015-01-24 ENCOUNTER — Other Ambulatory Visit: Payer: Self-pay | Admitting: *Deleted

## 2015-01-24 DIAGNOSIS — I639 Cerebral infarction, unspecified: Secondary | ICD-10-CM

## 2015-01-24 NOTE — Patient Outreach (Signed)
[facility]) Care Management  01/24/2015  BRISTYL MCLEES [birth year] [id number]  Emmi stroke referral: Per dashboard-red on-went to follow up appointment & scheduled follow up appointment. Per Epic review patient had hospital admission 07/26-29 for stroke & readmitted 08/05-08/02/2015 for heart incident.   Telephone call to patient who gave HIPPA verification. States she is doing well today and is not having trouble talking or facial drooping which resolved with treatment. States friend call 911 when symptoms were noticed. Patient states she understands what symptoms are now and will not hestitate to call 911 now if symptoms occur. States her spouse is aware of symptoms also knows action plan.   Patient states she has 2-3  doctors appointment in the following week with Cardiologist and Interventional radiologist. States she has not seen primary care doctor but has one. States she is taking medications as ordered by her doctors.States she had been taking ASA but is now on Eliquis (has chronic atrial fibrillation).   Patient agrees to [facility] follow up calls but is requesting that she not be called until after she has completed her scheduled MD appointments.   Plan: follow up call; follow care plan; do assessments ; patient agrees to set appointment. [name], RN BSN CCM Care Management Coordinator [facility] Care Management  [phone] .

## 2015-02-01 ENCOUNTER — Ambulatory Visit (HOSPITAL_BASED_OUTPATIENT_CLINIC_OR_DEPARTMENT_OTHER): Payer: 59 | Admitting: Hematology & Oncology

## 2015-02-01 ENCOUNTER — Ambulatory Visit (INDEPENDENT_AMBULATORY_CARE_PROVIDER_SITE_OTHER): Payer: 59 | Admitting: Cardiology

## 2015-02-01 ENCOUNTER — Other Ambulatory Visit (HOSPITAL_BASED_OUTPATIENT_CLINIC_OR_DEPARTMENT_OTHER): Payer: 59

## 2015-02-01 ENCOUNTER — Encounter: Payer: Self-pay | Admitting: Cardiology

## 2015-02-01 ENCOUNTER — Encounter: Payer: Self-pay | Admitting: Hematology & Oncology

## 2015-02-01 VITALS — BP 90/66 | HR 65 | Temp 97.9°F | Resp 18 | Ht 65.0 in | Wt 155.0 lb

## 2015-02-01 VITALS — BP 90/68 | HR 57 | Ht 65.0 in | Wt 156.4 lb

## 2015-02-01 DIAGNOSIS — E785 Hyperlipidemia, unspecified: Secondary | ICD-10-CM

## 2015-02-01 DIAGNOSIS — Z853 Personal history of malignant neoplasm of breast: Secondary | ICD-10-CM

## 2015-02-01 DIAGNOSIS — I214 Non-ST elevation (NSTEMI) myocardial infarction: Secondary | ICD-10-CM

## 2015-02-01 DIAGNOSIS — Z8673 Personal history of transient ischemic attack (TIA), and cerebral infarction without residual deficits: Secondary | ICD-10-CM

## 2015-02-01 DIAGNOSIS — I4891 Unspecified atrial fibrillation: Secondary | ICD-10-CM | POA: Diagnosis not present

## 2015-02-01 DIAGNOSIS — Z86000 Personal history of in-situ neoplasm of breast: Secondary | ICD-10-CM | POA: Diagnosis not present

## 2015-02-01 DIAGNOSIS — E559 Vitamin D deficiency, unspecified: Secondary | ICD-10-CM | POA: Diagnosis not present

## 2015-02-01 DIAGNOSIS — C50911 Malignant neoplasm of unspecified site of right female breast: Secondary | ICD-10-CM

## 2015-02-01 DIAGNOSIS — C50912 Malignant neoplasm of unspecified site of left female breast: Secondary | ICD-10-CM

## 2015-02-01 LAB — CBC WITH DIFFERENTIAL (CANCER CENTER ONLY)
BASO#: 0 10*3/uL (ref 0.0–0.2)
BASO%: 0.6 % (ref 0.0–2.0)
EOS%: 2.2 % (ref 0.0–7.0)
Eosinophils Absolute: 0.2 10*3/uL (ref 0.0–0.5)
HCT: 38.7 % (ref 34.8–46.6)
HGB: 13.1 g/dL (ref 11.6–15.9)
LYMPH#: 2.7 10*3/uL (ref 0.9–3.3)
LYMPH%: 39.5 % (ref 14.0–48.0)
MCH: 34.1 pg — ABNORMAL HIGH (ref 26.0–34.0)
MCHC: 33.9 g/dL (ref 32.0–36.0)
MCV: 101 fL (ref 81–101)
MONO#: 0.7 10*3/uL (ref 0.1–0.9)
MONO%: 9.4 % (ref 0.0–13.0)
NEUT#: 3.4 10*3/uL (ref 1.5–6.5)
NEUT%: 48.3 % (ref 39.6–80.0)
Platelets: 193 10*3/uL (ref 145–400)
RBC: 3.84 10*6/uL (ref 3.70–5.32)
RDW: 11.4 % (ref 11.1–15.7)
WBC: 6.9 10*3/uL (ref 3.9–10.0)

## 2015-02-01 LAB — COMPREHENSIVE METABOLIC PANEL (CC13)
ALT: 30 U/L (ref 0–55)
AST: 23 U/L (ref 5–34)
Albumin: 3.8 g/dL (ref 3.5–5.0)
Alkaline Phosphatase: 56 U/L (ref 40–150)
Anion Gap: 7 mEq/L (ref 3–11)
BUN: 10.1 mg/dL (ref 7.0–26.0)
CO2: 26 mEq/L (ref 22–29)
Calcium: 9.7 mg/dL (ref 8.4–10.4)
Chloride: 106 mEq/L (ref 98–109)
Creatinine: 0.9 mg/dL (ref 0.6–1.1)
EGFR: 70 mL/min/{1.73_m2} — ABNORMAL LOW (ref >90)
Glucose: 102 mg/dl (ref 70–140)
Potassium: 5.3 mEq/L — ABNORMAL HIGH (ref 3.5–5.1)
Sodium: 139 mEq/L (ref 136–145)
Total Bilirubin: 0.75 mg/dL (ref 0.20–1.20)
Total Protein: 7 g/dL (ref 6.4–8.3)

## 2015-02-01 MED ORDER — ATORVASTATIN CALCIUM 40 MG PO TABS: 80.0000 mg | ORAL_TABLET | Freq: Every day | ORAL | Status: DC

## 2015-02-01 NOTE — Assessment & Plan Note (Signed)
Slow rate, asymptomatic

## 2015-02-01 NOTE — Progress Notes (Signed)
02/01/2015 Rhonda Paul   [birth year]  [id number]  Primary Physician No PCP Per Patient Primary Cardiologist: Dr Tamala Julian  HPI:  [place]le[place] 64 y/o female followed by Dr Tamala Julian with a history of CAF on Eliquis, breast cancer, HTN, and HLD. She was adnmitted 01/16/15 with chest pain and ruled in for an MI with a Troponin of 8. Cath showed moderate CAD with a 75% OM stenosis. Plan is for medical Rx. Echo showed normal LVF. She is in the office today for follow up. Since discharge she has done well. She has resumed walking for exercise. Her only complaint is trouble taking the Lipitor 80 mg pill-"can't swallow it".   Current Outpatient Prescriptions  Medication Sig Dispense Refill  . acetaminophen (TYLENOL) 500 MG tablet Take 1,000 mg by mouth every 6 (six) hours as needed for moderate pain or headache.    Marland Kitchen apixaban (ELIQUIS) 5 MG TABS tablet Take 1 tablet (5 mg total) by mouth 2 (two) times daily. 60 tablet 3  . aspirin EC 81 MG EC tablet Take 1 tablet (81 mg total) by mouth daily.    Marland Kitchen atorvastatin (LIPITOR) 40 MG tablet Take 2 tablets (80 mg total) by mouth daily at 6 PM. 60 tablet 11  . isosorbide mononitrate (IMDUR) 30 MG 24 hr tablet Take 1 tablet (30 mg total) by mouth daily. 30 tablet 11  . levothyroxine (SYNTHROID, LEVOTHROID) 50 MCG tablet Take 1 tablet (50 mcg total) by mouth at bedtime. (Patient taking differently: Take 50 mcg by mouth daily before breakfast. ) 90 tablet 3  . metoprolol succinate (TOPROL-XL) 50 MG 24 hr tablet Take 50 mg by mouth daily.     . nitroGLYCERIN (NITROSTAT) 0.4 MG SL tablet Place 1 tablet (0.4 mg total) under the tongue every 5 (five) minutes as needed for chest pain. 25 tablet 3   No current facility-administered medications for this visit.    Allergies  Allergen Reactions  . Lambs Quarters Hives, Itching and Nausea And Vomiting  . Lanolin Acid [Lanolin] Rash  . Other Itching and Rash    wool    Social History   Social History  . Marital Status:  Married    Spouse Name: N/A  . Number of Children: N/A  . Years of Education: N/A   Occupational History  . Not on file.   Social History Main Topics  . Smoking status: Never Smoker   . Smokeless tobacco: Never Used     Comment: never used tobacco  . Alcohol Use: 0.0 oz/week    7-14 Glasses of wine per week  . Drug Use: No  . Sexual Activity: Yes    Birth Control/ Protection: Post-menopausal   Other Topics Concern  . Not on file   Social History Narrative     Review of Systems: General: negative for chills, fever, night sweats or weight changes.  Cardiovascular: negative for chest pain, dyspnea on exertion, edema, orthopnea, palpitations, paroxysmal nocturnal dyspnea or shortness of breath Dermatological: negative for rash Respiratory: negative for cough or wheezing Urologic: negative for hematuria Abdominal: negative for nausea, vomiting, diarrhea, bright red blood per rectum, melena, or hematemesis Neurologic: negative for visual changes, syncope, or dizziness All other systems reviewed and are otherwise negative except as noted above.    Blood pressure 90/68, pulse 57, height 5\' 5"  (1.651 m), weight 156 lb 6.4 oz (70.943 kg), last menstrual period 11/08/2001.  General appearance: alert, cooperative and no distress Neck: no carotid bruit and no JVD Lungs: clear  to auscultation bilaterally Heart: regular rate and rhythm Extremities: extremities normal, atraumatic, no cyanosis or edema Skin: Skin color, texture, turgor normal. No rashes or lesions Neurologic: Grossly normal  EKG AF with VR 57  ASSESSMENT AND PLAN:   NSTEMI- med rx, culprit lesion 75% OM1 No further chest pain  Permanent atrial fibrillation Slow rate, asymptomatic  Chronic anticoagulation  Eliquis  Hyperlipidemia Lipitor increase to 80 mg but she has trouble swallowing the 80 mg pill.   PLAN  Same Rx. I gave her an RX for Lipitor 40 mg two tablets daily. She has a f/u with Dr Tamala Julian  scheduled. She'll need f/u lipids and a CMET in 6 weeks.  Kerin Ransom K PA-C 02/01/2015 2:28 PM

## 2015-02-01 NOTE — Progress Notes (Signed)
Hematology and Oncology Follow Up Visit  Rhonda Paul [id number] [birth year] 64 y.o. 02/01/2015   Principle Diagnosis:  Stage IIA (T2 N0 M0) ductal carcinoma of the right breast.  Current Therapy:    Observation     Interim History:  Ms.  Paul is back for her yearly followup. She's doing well. About a month ago, she had a stroke. She did undergo thrombolytic therapy. She was having heart time talking. Thank you, she was at work when this happened. The neurologist got to a very quickly.  She has a history of fibrillation. She was just on aspirin for. She now is on ELIQUIS.  A few weeks after the stroke, she then had chest pain. She underwent cardiac cath. She was found to have a spasm and one of the small coronary arteries. She now is on Lipitor.  She had a mammogram done back in June. Everything looked fantastic.  She has had no problems with cough. She's had no nausea vomiting. There's been no change in bowel or bladder habits. She's had no leg swelling. She's had no rashes.  Overall, her performance status is ECOG 0.  She is doing a lot of yoga which she feels helping out quite a bit. She has no further lower back discomfort.    Medications:  Current outpatient prescriptions:  .  acetaminophen (TYLENOL) 500 MG tablet, Take 1,000 mg by mouth every 6 (six) hours as needed for moderate pain or headache., Disp: , Rfl:  .  apixaban (ELIQUIS) 5 MG TABS tablet, Take 1 tablet (5 mg total) by mouth 2 (two) times daily., Disp: 60 tablet, Rfl: 3 .  aspirin EC 81 MG EC tablet, Take 1 tablet (81 mg total) by mouth daily., Disp: , Rfl:  .  atorvastatin (LIPITOR) 80 MG tablet, Take 1 tablet (80 mg total) by mouth daily at 6 PM., Disp: 30 tablet, Rfl: 11 .  isosorbide mononitrate (IMDUR) 30 MG 24 hr tablet, Take 1 tablet (30 mg total) by mouth daily., Disp: 30 tablet, Rfl: 11 .  levothyroxine (SYNTHROID, LEVOTHROID) 50 MCG tablet, Take 1 tablet (50 mcg total) by mouth at bedtime. (Patient  taking differently: Take 50 mcg by mouth daily before breakfast. ), Disp: 90 tablet, Rfl: 3 .  metoprolol succinate (TOPROL-XL) 50 MG 24 hr tablet, , Disp: , Rfl:  .  nitroGLYCERIN (NITROSTAT) 0.4 MG SL tablet, Place 1 tablet (0.4 mg total) under the tongue every 5 (five) minutes as needed for chest pain., Disp: 25 tablet, Rfl: 3  Allergies:  Allergies  Allergen Reactions  . Lambs Quarters Hives, Itching and Nausea And Vomiting  . Lanolin Acid [Lanolin] Rash  . Other Itching and Rash    wool    Past Medical History, Surgical history, Social history, and Family History were reviewed and updated.  Review of Systems: As above  Physical Exam:  height is 5\' 5"  (1.651 m) and weight is 155 lb (70.308 kg). Her oral temperature is 97.9 F (36.6 C). Her blood pressure is 90/66 and her pulse is 65. Her respiration is 18.    well-developed and well-nourished white female. Head and neck exam shows no ocular or oral lesions. She has no palpable cervical or supraclavicular lymph nodes. Lungs are clear. Cardiac exam regular rate and rhythm with no murmurs rubs or bruits. Breast exam shows left breast with no masses, edema or erythema. There is no left axillary adenopathy. Right breast shows a well-healed lumpectomy at the 12:00 position. She has some slight contraction  from radiation and surgery of the right breast. No distinct masses noted. There is no right axillary adenopathy. Abdomen is soft. She has good bowel sounds. There is no fluid wave. There is no palpable liver or spleen tip. Back exam shows no tenderness over the spine, ribs or hips. Extremities shows no clubbing cyanosis or edema. Neurological exam shows no focal neurological deficits.  Lab Results  Component Value Date   WBC 6.9 02/01/2015   HGB 13.1 02/01/2015   HCT 38.7 02/01/2015   MCV 101 02/01/2015   PLT 193 02/01/2015     Chemistry      Component Value Date/Time   NA 137 01/17/2015 0442   K 4.2 01/17/2015 0442   CL 104  01/17/2015 0442   CO2 25 01/17/2015 0442   BUN 8 01/17/2015 0442   CREATININE 0.90 01/17/2015 0442   CREATININE 0.92 01/03/2014 1110      Component Value Date/Time   CALCIUM 9.5 01/17/2015 0442   ALKPHOS 56 01/17/2015 0442   AST 40 01/17/2015 0442   ALT 41 01/17/2015 0442   BILITOT 0.9 01/17/2015 0442         Impression and Plan: Rhonda Paul is a 64 year old female with a history of stage II wave ductal carcinoma right breast. She was diagnosed about 13 years ago. Her tumor was ER negative. She received 6 cycles of FEC. She then received radiation.  She has the itch or fibrillation. She had the stroke. She now is on ELIQUIS. She is doing great. She looks good from my point of view.  We will continue to follow her along. I really don't think that she will have problems with recurrent breast cancer.Burnis Medin see her back in one year.   Volanda Napoleon, MD 8/24/20169:43 AM

## 2015-02-01 NOTE — Patient Instructions (Addendum)
Medication Instructions:  We are sending in a new prescription for Lipitor 40 mgs take 2 daily.  Labwork: CMET   (to be done in 6 weeks) Lipid      (to be done in 6 weeks)  Testing/Procedures: None ordered  Follow-Up: Your physician recommends that you keep your follow-up appointment as planned with Dr. Tamala Julian.   Any Other Special Instructions Will Be Listed Below (If Applicable).

## 2015-02-01 NOTE — Assessment & Plan Note (Signed)
No further chest pain

## 2015-02-01 NOTE — Assessment & Plan Note (Signed)
Eliquis 

## 2015-02-01 NOTE — Assessment & Plan Note (Signed)
Lipitor increase to 80 mg but she has trouble swallowing the 80 mg pill.

## 2015-02-02 ENCOUNTER — Ambulatory Visit (HOSPITAL_COMMUNITY)
Admission: RE | Admit: 2015-02-02 | Discharge: 2015-02-02 | Disposition: A | Discharge status: Home / Self Care | Payer: 59 | Source: Ambulatory Visit | Attending: Interventional Radiology | Admitting: Interventional Radiology

## 2015-02-02 ENCOUNTER — Telehealth: Payer: Self-pay | Admitting: *Deleted

## 2015-02-02 DIAGNOSIS — I639 Cerebral infarction, unspecified: Secondary | ICD-10-CM

## 2015-02-02 LAB — VITAMIN D 25 HYDROXY (VIT D DEFICIENCY, FRACTURES): Vit D, 25-Hydroxy: 37 ng/mL (ref 30–100)

## 2015-02-02 NOTE — Telephone Encounter (Addendum)
Message left on personal voice mail  ----- Message from Volanda Napoleon, MD sent at 02/02/2015 12:31 PM EDT ----- Call and tell her that the Sasser D level is okay. Keep taking vitamin D. Thanks

## 2015-02-14 ENCOUNTER — Telehealth (HOSPITAL_COMMUNITY): Payer: Self-pay

## 2015-02-14 NOTE — Telephone Encounter (Signed)
Pt. declined CRPII at this time. Instructed pt to call if anything changes. Pt mentioned that she would exercise on her own for now.

## 2015-02-15 NOTE — Progress Notes (Signed)
Cardiology Office Note   Date:  02/15/2015   ID:  KLARE CRISS, DOB [birth year], MRN [id number]  PCP:  No PCP Per Patient  Cardiologist:  Sinclair Grooms, MD   Chief Complaint  Patient presents with  . Atrial Fibrillation  . Cerebrovascular Accident    Embolic stroke      History of Present Illness: OMOLOLA Paul is a 64 y.o. female who presents for chronic atrial fibrillation, embolic CVA, recent non-ST elevation MI, intermediate coronary stenosis by angiography, and essential hypertension.  She has multiple questions. Been no recurrences of chest discomfort. She had a non-ST elevation myocardial infarction proximally 7-10 days after an embolic stroke related to atrial fibrillation. She received TPA with dramatic improvement. There've been no recurrences of neurological symptoms.  She denies palpitations and angina. There is no peripheral edema. No medication side effects. She is on both aspirin and apixaban.   Past Medical History  Diagnosis Date  . Vitamin D deficiency 07/2006  . Diverticulitis 2009    diverticulitis  . Adenocarcinoma of breast 06/03    RIGHT  . Amenorrhea 09/01  . Migraine 09/01  . Infertility, female 09/01    4 SAB  . Amenorrhea   . Metrorrhagia   . Chemotherapy adverse reaction   . Hypertension   . Permanent atrial fibrillation 04/01/2014  . Stroke 01/03/15    s/p TPA R-MCA  . STD (sexually transmitted disease) 1976    Hx of HSV  . Chronic anticoagulation     Eliquis    Past Surgical History  Procedure Laterality Date  . Colonoscopy  11/2012    diverticulitis, 2 polyps negative, repeat in 5 years  . Appendectomy  1959  . Breast surgery Right 11/2001    lumpectomy with sentinel node biopsy X 3 all negative, ER/PR negative , chemo 6 doses, and radiation daily X 6 weeks  . Radiology with anesthesia N/A 01/03/2015    Procedure: RADIOLOGY WITH ANESTHESIA;  Surgeon: Medication Radiologist, MD;  Location: [facility];  Service: Radiology;   Laterality: N/A;  . Cardiac catheterization N/A 01/16/2015    Procedure: Left Heart Cath and Coronary Angiography;  Surgeon: Belva Crome, MD; OM1 75%, RCA 30%, EF normal; spasm in RCA w/ catheter engagement      Current Outpatient Prescriptions  Medication Sig Dispense Refill  . acetaminophen (TYLENOL) 500 MG tablet Take 1,000 mg by mouth every 6 (six) hours as needed for moderate pain or headache.    Marland Kitchen apixaban (ELIQUIS) 5 MG TABS tablet Take 1 tablet (5 mg total) by mouth 2 (two) times daily. 60 tablet 3  . aspirin EC 81 MG EC tablet Take 1 tablet (81 mg total) by mouth daily.    Marland Kitchen atorvastatin (LIPITOR) 40 MG tablet Take 2 tablets (80 mg total) by mouth daily at 6 PM. 60 tablet 11  . isosorbide mononitrate (IMDUR) 30 MG 24 hr tablet Take 1 tablet (30 mg total) by mouth daily. 30 tablet 11  . levothyroxine (SYNTHROID, LEVOTHROID) 50 MCG tablet Take 1 tablet (50 mcg total) by mouth at bedtime. (Patient taking differently: Take 50 mcg by mouth daily before breakfast. ) 90 tablet 3  . metoprolol succinate (TOPROL-XL) 50 MG 24 hr tablet Take 50 mg by mouth daily.     . nitroGLYCERIN (NITROSTAT) 0.4 MG SL tablet Place 1 tablet (0.4 mg total) under the tongue every 5 (five) minutes as needed for chest pain. 25 tablet 3   No current facility-administered medications for  this visit.    Allergies:   Lambs quarters; Lanolin acid; and Other    Social History:  The patient  reports that she has never smoked. She has never used smokeless tobacco. She reports that she drinks alcohol. She reports that she does not use illicit drugs.   Family History:  The patient's family history includes Breast cancer in her maternal aunt; Cancer in her maternal aunt and mother; Cervical cancer in her mother; Colon cancer in her mother; Heart disease in her brother and father; Multiple births in her maternal grandmother and paternal grandmother; Osteoporosis in her mother.    ROS:  Please see the history of present  illness.   Otherwise, review of systems are positive for concern about high dose statin therapy. No muscle aches. She has had easy bruising. No headache. Back fully engaged at work..   All other systems are reviewed and negative.    PHYSICAL EXAM: VS:  LMP 11/08/2001 (Approximate) , BMI There is no weight on file to calculate BMI. GEN: Well nourished, well developed, in no acute distress HEENT: normal Neck: no JVD, carotid bruits, or masses Cardiac: IIRR.  There is  no murmur, rub, or gallop. There is no  edema. Respiratory:  clear to auscultation bilaterally, normal work of breathing. GI: soft, nontender, nondistended, + BS MS: no deformity or atrophy Skin: warm and dry, no rash Neuro:  Strength and sensation are intact Psych: euthymic mood, full affect   EKG:  EKG is not  ordered today.   Recent Labs: 01/13/2015: TSH 2.228 01/14/2015: B Natriuretic Peptide 434.2* 02/01/2015: ALT 30; BUN 10.1; Creatinine 0.9; HGB 13.1; Platelets 193; Potassium 5.3*; Sodium 139    Lipid Panel    Component Value Date/Time   CHOL 234* 01/17/2015 0442   TRIG 148 01/17/2015 0442   HDL 50 01/17/2015 0442   CHOLHDL 4.7 01/17/2015 0442   VLDL 30 01/17/2015 0442   LDLCALC 154* 01/17/2015 0442      Wt Readings from Last 3 Encounters:  02/01/15 70.943 kg (156 lb 6.4 oz)  02/01/15 70.308 kg (155 lb)  01/17/15 75.342 kg (166 lb 1.6 oz)      Other studies Reviewed: Additional studies/ records that were reviewed today include: Reviewed hospital records. The findings include no new data gleaned.    ASSESSMENT AND PLAN:  1. Permanent atrial fibrillation  Rate is controlled   2. Moderate mitral regurgitation  Not clinically present   3. NSTEMI- med rx, culprit lesion 75% OM1  No evidence of heart failure or LV dysfunction   4. Cerebral infarction due to embolism of right carotid artery  No recurrent symptoms   5. Chronic anticoagulation   No bleeding complications on antiplatelet  therapy and apixaban   Current medicines are reviewed at length with the patient today.  The patient has the following concerns regarding medicines: Dual antiplatelet therapy and intensity of statin therapy.  The following changes/actions have been instituted:    Discontinue isosorbide  Stop aspirin in mid November  Adjust statin dose with lab data that will be done in November  Labs/ tests ordered today include:  No orders of the defined types were placed in this encounter.     Disposition:   FU with HS in 4 months  Signed, Sinclair Grooms, MD  02/15/2015 12:28 PM    [address], [place], [place]  [address] Phone: [phone]; Fax: [phone]

## 2015-02-16 ENCOUNTER — Ambulatory Visit (INDEPENDENT_AMBULATORY_CARE_PROVIDER_SITE_OTHER): Payer: 59 | Admitting: Interventional Cardiology

## 2015-02-16 ENCOUNTER — Encounter: Payer: Self-pay | Admitting: Interventional Cardiology

## 2015-02-16 VITALS — BP 100/70 | HR 60 | Ht 65.0 in | Wt 156.4 lb

## 2015-02-16 DIAGNOSIS — I214 Non-ST elevation (NSTEMI) myocardial infarction: Secondary | ICD-10-CM | POA: Diagnosis not present

## 2015-02-16 DIAGNOSIS — Z7901 Long term (current) use of anticoagulants: Secondary | ICD-10-CM

## 2015-02-16 DIAGNOSIS — I340 Nonrheumatic mitral (valve) insufficiency: Secondary | ICD-10-CM | POA: Diagnosis not present

## 2015-02-16 DIAGNOSIS — I63131 Cerebral infarction due to embolism of right carotid artery: Secondary | ICD-10-CM

## 2015-02-16 DIAGNOSIS — I482 Chronic atrial fibrillation: Secondary | ICD-10-CM

## 2015-02-16 DIAGNOSIS — I4821 Permanent atrial fibrillation: Secondary | ICD-10-CM

## 2015-02-16 NOTE — Patient Instructions (Signed)
Medication Instructions:  Your physician has recommended you make the following change in your medication:  1) STOP Aspirin in Mid November 2) STOP Imdur   Labwork: None ordered  Testing/Procedures: None orderded  Follow-Up: Your physician recommends that you schedule a follow-up appointment in: 4 months with Dr.Smith   Any Other Special Instructions Will Be Listed Below (If Applicable).

## 2015-02-21 ENCOUNTER — Encounter: Payer: Self-pay | Admitting: *Deleted

## 2015-02-21 ENCOUNTER — Other Ambulatory Visit: Payer: Self-pay | Admitting: *Deleted

## 2015-02-21 NOTE — Patient Outreach (Signed)
[facility]) Care Management  02/21/2015  Rhonda Paul 04-21-1951 [id number]   EMMI-Stroke follow up:  Telephone call to patient who gave HIPPA verification. Consent to continue Emmi-stroke call received from patient.  Subjective: Patient  voices that she has completed appointments with interventional radiologist 08/25 and cardiologist on 08/24 and 09/08. Patient reports that she has not had any stroke symptoms since previous stroke. States she has atrial fibrillation that is controlled. Voices that she is feeling well and has returned to work and is driving without problems. States she is currently taking Eliquis and Aspirin as prescribed by her specialist since stroke and cardiac event. States other medications have been adjusted and she is taking as prescribed. Patient voices that she has not seen primary care provider in over a year; states reassigned to new doctor. Has not received call regarding follow up appointment with neurologist.   Objective:  Per EPIC review patient has had follow up yearly check up with oncologist and gynecologist and other specialists as noted above.  Medications are listed in EPIC.   Assessment:  Patient has not seen primary care doctor in follow up since hospital stay.  States getting lab work done at [facility]. Does not have follow up appointment with neurologist.  Patient having no problems getting medications and is taking as prescribed by doctor. Voices understanding of symptoms of stroke and knows to call 911. Has home exercise program of walking on treadmill 5 days weekly.   Plan:  Will follow care plan as noted. Encourage patient to make follow up appointments with primary care and neurology offices. Will follow up next week. Patient agrees with set appointment.   [name], RN BSN [place] Management Coordinator [facility] Care Management  [phone]

## 2015-02-28 ENCOUNTER — Other Ambulatory Visit: Payer: Self-pay | Admitting: *Deleted

## 2015-02-28 NOTE — Patient Outreach (Signed)
[facility]) Care Management  02/28/2015  Rhonda Paul [birth year] [id number]   EMMI-stroke follow up call:  Telephone call to patient who voices that she is not available to talk now but that she is doing fine.    Plan: recall at set appointment agreed upon by patient   [name], RN BSN [place] Management Coordinator [facility] Care Management  [phone]

## 2015-03-01 ENCOUNTER — Other Ambulatory Visit: Payer: Self-pay | Admitting: *Deleted

## 2015-03-01 NOTE — Patient Outreach (Signed)
[facility]) Care Management  03/01/2015  SHA BURLING 1951/04/12 [id number]   EMMI-Stroke follow up call:  Telephone call to patient who voices that she is has not had stroke symptoms since admission to hospital for stroke. States she is aware of stroke symptoms and knows to call 911 or have somebody else call if symptoms occur.  States no change in medications have been made and that she is taking medications as directed by her doctor. States has seen cardiologist in follow up several times; will be getting blood work done 1st week in October.  States she will make appointment for neurology follow up. Has not seen primary care but plans to make appointment to be seen in the future. Patient advised of importance of primary care follow up. Patient voices understanding.   Care plan updated as noted.  Plan: will follow; patient agrees with set appointment [name], RN BSN [place] Management Coordinator [facility] Care Management  [phone]

## 2015-03-08 ENCOUNTER — Ambulatory Visit: Payer: 59 | Admitting: *Deleted

## 2015-03-10 ENCOUNTER — Other Ambulatory Visit: Payer: Self-pay | Admitting: *Deleted

## 2015-03-10 ENCOUNTER — Other Ambulatory Visit: Payer: 59 | Admitting: *Deleted

## 2015-03-10 NOTE — Patient Outreach (Signed)
[place] [facility]) Care Management  03/10/2015  Rhonda Paul 1950/08/31 [id number]  Emmi-Stroke follow up telephone call; patient unable to talk now but requested RN case manager to call back later today.  Plan: will follow up. [name], RN BSN  Management Coordinator [facility] Care Management  [phone]

## 2015-03-10 NOTE — Patient Outreach (Signed)
[place] [facility]) Care Management  03/10/2015  Rhonda Paul 11-03-1950 [id number]   Return call to patient; no answer, unable to leave message. Plan: will follow up next week.  [name], RN BSN [place] Management Coordinator [facility] Care Management  [phone]

## 2015-03-14 ENCOUNTER — Other Ambulatory Visit: Payer: 59 | Admitting: *Deleted

## 2015-03-14 NOTE — Patient Outreach (Signed)
[facility]) Care Management  03/14/2015  Rhonda Paul 1951-04-05 [id number]   Emmi-Stroke follow up:  Telephone to patient, left message requesting return call. Plan: will follow up.  [name], RN BSN [place] Management Coordinator [facility] Care Management  [phone]

## 2015-03-15 ENCOUNTER — Other Ambulatory Visit (INDEPENDENT_AMBULATORY_CARE_PROVIDER_SITE_OTHER): Payer: 59 | Admitting: *Deleted

## 2015-03-15 DIAGNOSIS — E785 Hyperlipidemia, unspecified: Secondary | ICD-10-CM | POA: Diagnosis not present

## 2015-03-15 LAB — COMPREHENSIVE METABOLIC PANEL
ALT: 24 U/L (ref 6–29)
AST: 20 U/L (ref 10–35)
Albumin: 4.1 g/dL (ref 3.6–5.1)
Alkaline Phosphatase: 45 U/L (ref 33–130)
BUN: 13 mg/dL (ref 7–25)
CO2: 27 mmol/L (ref 20–31)
Calcium: 9.1 mg/dL (ref 8.6–10.4)
Chloride: 105 mmol/L (ref 98–110)
Creat: 0.78 mg/dL (ref 0.50–0.99)
Glucose, Bld: 107 mg/dL — ABNORMAL HIGH (ref 65–99)
Potassium: 4.6 mmol/L (ref 3.5–5.3)
Sodium: 140 mmol/L (ref 135–146)
Total Bilirubin: 0.9 mg/dL (ref 0.2–1.2)
Total Protein: 6.8 g/dL (ref 6.1–8.1)

## 2015-03-15 LAB — LIPID PANEL
Cholesterol: 153 mg/dL (ref 125–200)
HDL: 72 mg/dL (ref >=46)
LDL Cholesterol: 66 mg/dL (ref <130)
Total CHOL/HDL Ratio: 2.1 Ratio (ref <=5.0)
Triglycerides: 75 mg/dL (ref <150)
VLDL: 15 mg/dL (ref <30)

## 2015-03-16 ENCOUNTER — Other Ambulatory Visit: Payer: Self-pay

## 2015-03-16 DIAGNOSIS — E785 Hyperlipidemia, unspecified: Secondary | ICD-10-CM

## 2015-03-24 ENCOUNTER — Other Ambulatory Visit: Payer: Self-pay | Admitting: *Deleted

## 2015-03-24 NOTE — Patient Outreach (Signed)
[place] [facility]) Care Management  03/24/2015  Rhonda Paul 1951/06/06 [id number]   Telephone call to patient; left message on voice mail requesting return call.    Plan: Will follow up. [name], RN BSN [place] Management Coordinator [facility] Care Management  [phone]

## 2015-03-28 ENCOUNTER — Other Ambulatory Visit: Payer: 59 | Admitting: *Deleted

## 2015-03-28 NOTE — Patient Outreach (Signed)
[place] [facility]) Care Management  03/28/2015  LATICIA VANNOSTRAND Aug 09, 1950 [id number]  Telephone call to patient x 3: left message on voice mail.   Plan: Will close out EMMI-stroke case.  [name], RN BSN [place] Management Coordinator [facility] Care Management  [phone]

## 2015-03-30 NOTE — Patient Outreach (Signed)
[facility]) Care Management  03/30/2015  PINA SIRIANNI Oct 14, 1950 [id number]   Notification from [name], RN to close case due to goals met with [facility] Stroke Transition.  Thanks, [name]. [place], [place] Assistant Phone: [phone] Fax: [phone]

## 2015-04-19 ENCOUNTER — Telehealth: Payer: Self-pay | Admitting: Interventional Cardiology

## 2015-04-19 MED ORDER — APIXABAN 5 MG PO TABS: 5.0000 mg | ORAL_TABLET | Freq: Two times a day (BID) | ORAL | Status: DC

## 2015-04-19 NOTE — Telephone Encounter (Signed)
Pt 's medication was sent to her pharmacy. Confirmation received

## 2015-04-19 NOTE — Telephone Encounter (Signed)
°*  STAT* If patient is at the pharmacy, call can be transferred to refill team.   1. Which medications need to be refilled? (please list name of each medication and dose if known) Eliquis 5mg   2. Which pharmacy/location (including street and city if local pharmacy) is medication to be sent to?[facility]   3. Do they need a 30 day or 90 day supply? 30day

## 2015-07-19 ENCOUNTER — Encounter: Payer: Self-pay | Admitting: Interventional Cardiology

## 2015-07-26 ENCOUNTER — Encounter: Payer: Self-pay | Admitting: Interventional Cardiology

## 2015-07-26 ENCOUNTER — Ambulatory Visit (INDEPENDENT_AMBULATORY_CARE_PROVIDER_SITE_OTHER): Payer: BLUE CROSS/BLUE SHIELD | Admitting: Interventional Cardiology

## 2015-07-26 VITALS — BP 108/64 | HR 60 | Ht 65.0 in | Wt 150.8 lb

## 2015-07-26 DIAGNOSIS — Z7901 Long term (current) use of anticoagulants: Secondary | ICD-10-CM | POA: Diagnosis not present

## 2015-07-26 DIAGNOSIS — I63131 Cerebral infarction due to embolism of right carotid artery: Secondary | ICD-10-CM

## 2015-07-26 DIAGNOSIS — I214 Non-ST elevation (NSTEMI) myocardial infarction: Secondary | ICD-10-CM

## 2015-07-26 DIAGNOSIS — E785 Hyperlipidemia, unspecified: Secondary | ICD-10-CM | POA: Diagnosis not present

## 2015-07-26 DIAGNOSIS — I482 Chronic atrial fibrillation: Secondary | ICD-10-CM | POA: Diagnosis not present

## 2015-07-26 DIAGNOSIS — I340 Nonrheumatic mitral (valve) insufficiency: Secondary | ICD-10-CM | POA: Diagnosis not present

## 2015-07-26 DIAGNOSIS — I4821 Permanent atrial fibrillation: Secondary | ICD-10-CM

## 2015-07-26 MED ORDER — ATORVASTATIN CALCIUM 40 MG PO TABS: 40.0000 mg | ORAL_TABLET | Freq: Every day | ORAL | Status: DC

## 2015-07-26 NOTE — Patient Instructions (Signed)
Medication Instructions:  Your physician has recommended you make the following change in your medication:  DECREASE Atorvastatin to 40mg  daily   Labwork: Your physician recommends that you return for a FASTING lipid profile: in 2 months    Testing/Procedures: None ordered  Follow-Up: Your physician wants you to follow-up in: 1 year with Dr.Smith You will receive a reminder letter in the mail two months in advance. If you don't receive a letter, please call our office to schedule the follow-up appointment.   Any Other Special Instructions Will Be Listed Below (If Applicable).     If you need a refill on your cardiac medications before your next appointment, please call your pharmacy.

## 2015-07-26 NOTE — Progress Notes (Signed)
Cardiology Office Note   Date:  07/26/2015   ID:  PHOENYX GACIA, DOB [birth year], MRN [id number]  PCP:  Tawanna Solo, MD  Cardiologist:  Sinclair Grooms, MD   Chief Complaint  Patient presents with  . Atrial Fibrillation      History of Present Illness: ARDINE DULAY is a 65 y.o. female who presents for  CAD, embolic CVA, permanent atrial fibrillation, non-ST elevation myocardial infarction due to plaque rupture, an chronic anticoagulation therapy.   Aaishah is been totally asymptomatic with reference to cardiac and neurovascular complaints. No medication side effects. Exercising regularly without limitations. No side effects to her current medical regimen. Concerned about maximum dose statin therapy. Last LDL was 66.    Past Medical History  Diagnosis Date  . Vitamin D deficiency 07/2006  . Diverticulitis 2009    diverticulitis  . Adenocarcinoma of breast ([place]) 06/03    RIGHT  . Amenorrhea 09/01  . Migraine 09/01  . Infertility, female 09/01    4 SAB  . Amenorrhea   . Metrorrhagia   . Chemotherapy adverse reaction   . Hypertension   . Permanent atrial fibrillation ([employer]) 04/01/2014  . Stroke ([place]) 01/03/15    s/p TPA R-MCA  . STD (sexually transmitted disease) 1976    Hx of HSV  . Chronic anticoagulation     Eliquis    Past Surgical History  Procedure Laterality Date  . Colonoscopy  11/2012    diverticulitis, 2 polyps negative, repeat in 5 years  . Appendectomy  1959  . Breast surgery Right 11/2001    lumpectomy with sentinel node biopsy X 3 all negative, ER/PR negative , chemo 6 doses, and radiation daily X 6 weeks  . Radiology with anesthesia N/A 01/03/2015    Procedure: RADIOLOGY WITH ANESTHESIA;  Surgeon: Medication Radiologist, MD;  Location: [facility];  Service: Radiology;  Laterality: N/A;  . Cardiac catheterization N/A 01/16/2015    Procedure: Left Heart Cath and Coronary Angiography;  Surgeon: Belva Crome, MD; OM1 75%, RCA 30%, EF normal; spasm  in RCA w/ catheter engagement      Current Outpatient Prescriptions  Medication Sig Dispense Refill  . acetaminophen (TYLENOL) 500 MG tablet Take 1,000 mg by mouth every 6 (six) hours as needed for moderate pain or headache.    Marland Kitchen apixaban (ELIQUIS) 5 MG TABS tablet Take 1 tablet (5 mg total) by mouth 2 (two) times daily. 60 tablet 10  . atorvastatin (LIPITOR) 40 MG tablet Take 1 tablet (40 mg total) by mouth daily at 6 PM. 60 tablet 11  . levothyroxine (SYNTHROID, LEVOTHROID) 50 MCG tablet Take 50 mcg by mouth daily before breakfast.  6  . metoprolol succinate (TOPROL-XL) 50 MG 24 hr tablet Take 50 mg by mouth daily.     . nitroGLYCERIN (NITROSTAT) 0.4 MG SL tablet Place 1 tablet (0.4 mg total) under the tongue every 5 (five) minutes as needed for chest pain. 25 tablet 3   No current facility-administered medications for this visit.    Allergies:   Lambs quarters; Lanolin acid; and Other    Social History:  The patient  reports that she has never smoked. She has never used smokeless tobacco. She reports that she drinks alcohol. She reports that she does not use illicit drugs.   Family History:  The patient's family history includes Breast cancer in her maternal aunt; Cancer in her maternal aunt and mother; Cervical cancer in her mother; Colon cancer in her mother; Heart  disease in her brother and father; Multiple births in her maternal grandmother and paternal grandmother; Osteoporosis in her mother.    ROS:  Please see the history of present illness.   Otherwise, review of systems are positive for  none.   All other systems are reviewed and negative.    PHYSICAL EXAM: VS:  BP 108/64 mmHg  Pulse 60  Ht 5\' 5"  (1.651 m)  Wt 150 lb 12.8 oz (68.402 kg)  BMI 25.09 kg/m2  LMP 11/08/2001 (Approximate) , BMI Body mass index is 25.09 kg/(m^2). GEN: Well nourished, well developed, in no acute distress HEENT: normal Neck: no JVD, carotid bruits, or masses Cardiac: RRR.  There is  A soft  apical systolic murmur.  There is no rub, or gallop. There is no edema. Respiratory:  clear to auscultation bilaterally, normal work of breathing. GI: soft, nontender, nondistended, + BS MS: no deformity or atrophy Skin: warm and dry, no rash Neuro:  Strength and sensation are intact Psych: euthymic mood, full affect   EKG:  EKG is not ordered today.    Recent Labs: 01/13/2015: TSH 2.228 01/14/2015: B Natriuretic Peptide 434.2* 02/01/2015: HGB 13.1; Platelets 193 03/15/2015: ALT 24; BUN 13; Creat 0.78; Potassium 4.6; Sodium 140    Lipid Panel    Component Value Date/Time   CHOL 153 03/15/2015 0829   TRIG 75 03/15/2015 0829   HDL 72 03/15/2015 0829   CHOLHDL 2.1 03/15/2015 0829   VLDL 15 03/15/2015 0829   LDLCALC 66 03/15/2015 0829      Wt Readings from Last 3 Encounters:  07/26/15 150 lb 12.8 oz (68.402 kg)  03/01/15 155 lb (70.308 kg)  02/16/15 156 lb 6.4 oz (70.943 kg)      Other studies Reviewed: Additional studies/ records that were reviewed today include: prior lipids. The findings include At goal..    ASSESSMENT AND PLAN:  1. Moderate mitral regurgitation  Clinically insignificant systolic murmur  2. Permanent atrial fibrillation (HCC)  Controlled rate  3. Chronic anticoagulation   No bleeding complication  4. Hyperlipidemia  LDL 66 on 80 mg of atorvastatin 2 months ago  5. Cerebral infarction due to embolism of right carotid artery (HCC)  Embolic and resolved with TPA  6. NSTEMI- med rx, culprit lesion 75% OM1  aAcute coronary syndrome due to plaque rupture.    Current medicines are reviewed at length with the patient today.  The patient has the following concerns regarding medicines: .  The following changes/actions have been instituted:     decrease atorvastatin to 40 mg daily   Liver and lipid panel in 2 months   continue aggressive aerobic activity  Labs/ tests ordered today include:   Orders Placed This Encounter  Procedures  .  Lipid panel     Disposition:   FU with HS in 1 year  Signed, Sinclair Grooms, MD  07/26/2015 1:54 PM    [facility] [address], [place], [place]  [address] Phone: [phone]; Fax: [phone]

## 2015-09-05 ENCOUNTER — Ambulatory Visit (INDEPENDENT_AMBULATORY_CARE_PROVIDER_SITE_OTHER): Payer: BLUE CROSS/BLUE SHIELD | Admitting: Certified Nurse Midwife

## 2015-09-05 ENCOUNTER — Encounter: Payer: Self-pay | Admitting: Certified Nurse Midwife

## 2015-09-05 VITALS — BP 106/72 | HR 70 | Resp 16 | Ht 65.0 in | Wt 150.0 lb

## 2015-09-05 DIAGNOSIS — N390 Urinary tract infection, site not specified: Secondary | ICD-10-CM

## 2015-09-05 DIAGNOSIS — N949 Unspecified condition associated with female genital organs and menstrual cycle: Secondary | ICD-10-CM | POA: Diagnosis not present

## 2015-09-05 DIAGNOSIS — N952 Postmenopausal atrophic vaginitis: Secondary | ICD-10-CM | POA: Diagnosis not present

## 2015-09-05 LAB — POCT URINALYSIS DIPSTICK
Bilirubin, UA: NEGATIVE
Blood, UA: NEGATIVE
Glucose, UA: NEGATIVE
Ketones, UA: NEGATIVE
Leukocytes, UA: NEGATIVE
Nitrite, UA: NEGATIVE
Protein, UA: NEGATIVE
Urobilinogen, UA: NEGATIVE
pH, UA: 5

## 2015-09-05 NOTE — Patient Instructions (Signed)
Atrophic Vaginitis Atrophic vaginitis is when the tissues that line the vagina become dry and thin. This is caused by a drop in estrogen. Estrogen helps:  To keep the vagina moist.  To make a clear fluid that helps:  To lubricate the vagina for sex.  To protect the vagina from infection. If the lining of the vagina is dry and thin, it may:  Make sex painful. It may also cause bleeding.  Cause a feeling of:  Burning.  Irritation.  Itchiness.  Make an exam of your vagina painful. It may also cause bleeding.  Make you lose interest in sex.  Cause a burning feeling when you pee.  Make your vaginal fluid (discharge) brown or yellow. For some women, there are no symptoms. This condition is most common in women who do not get their regular menstrual periods anymore (menopause). This often starts when a woman is 45-55 years old. HOME CARE  Take medicines only as told by your doctor. Do not use any herbal or alternative medicines unless your doctor says it is okay.  Use over-the-counter products for dryness only as told by your doctor. These include:  Creams.  Lubricants.  Moisturizers.  Do not douche.  Do not use products that can make your vagina dry. These include:  Scented feminine sprays.  Scented tampons.  Scented soaps.  If it hurts to have sex, tell your sexual partner. GET HELP IF:  Your discharge looks different than normal.  Your vagina has an unusual smell.  You have new symptoms.  Your symptoms do not get better with treatment.  Your symptoms get worse.   This information is not intended to replace advice given to you by your health care provider. Make sure you discuss any questions you have with your health care provider.   Document Released: 11/13/2007 Document Revised: 10/11/2014 Document Reviewed: 05/18/2014 Elsevier Interactive Patient Education 2016 Elsevier Inc.  

## 2015-09-05 NOTE — Progress Notes (Signed)
65 y.o. Married Caucasian female [phone] here with complaint of UTI, with onset  on 2 weeks. Was seen in Urgent care and treated for UTI with Keflex for 10 days and on Pyridium use for 3 days.. Was told her culture was negative.Patient here with complaining of just subtle symptom with slight discomfort. Denies urinary frequency or urgency. Patient denies fever, chills or back pain. Patient has history of dryness and stopped using Estring, due history of heart problems.Feels some vaginal dryness and ? burning.Menopausal with vaginal dryness. Patient  Is consuming adequate water intake.   O: Healthy female WDWN Affect: Normal, orientation x 3 Skin : warm and dry CVAT: negative bilateral Abdomen: negative for suprapubic tenderness  Pelvic exam: External genital area: normal, no lesions, atrophic appearance at introitus with slight tenderness Bladder,Urethra tender non tender, Urethral meatus: no redness or slight tenderness to touch Vagina: white scant vaginal discharge,atrophic appearance, slightly tender  Affirm taken Cervix: normal, non tender Uterus:normal,non tender Adnexa: normal non tender, no fullness or masses   A: Normal pelvic exam UTI under last day of treatment from Urgent care slightly symptomatic R/O UTI Atrophic vaginitis previous Estring use but with cardiac history stopped using. R/O vaginal infection  poct urine-neg  P: Reviewed findings of normal pelvic exam. Discussed urine negative on strip check, but may have positive culture. Finish medication last day today. Continue increased water intake and can do Azo OTC for burning if needed. Aware of warning signs of UTI and will advise if increases. [id number]  Culture Discussed atrophic vaginitis and etiology. Discussed can cause vaginal burning as well as antibiotic can cause yeast symptoms. Discussed coconut oil use for vaginal dryness daily and instructions given. Affirm taken to to R/O vaginal infection. Comfort measures  with Aveeno or Baking soda sitz reviewed with instructions. Questions addressed. Will treat if indicated.  RV prn

## 2015-09-06 LAB — URINE CULTURE
Colony Count: NO GROWTH
Organism ID, Bacteria: NO GROWTH

## 2015-09-06 LAB — WET PREP BY MOLECULAR PROBE
Candida species: NEGATIVE
Gardnerella vaginalis: NEGATIVE
Trichomonas vaginosis: NEGATIVE

## 2015-09-06 NOTE — Progress Notes (Signed)
Encounter reviewed Talana Slatten, MD   

## 2015-09-25 ENCOUNTER — Other Ambulatory Visit (INDEPENDENT_AMBULATORY_CARE_PROVIDER_SITE_OTHER): Payer: BLUE CROSS/BLUE SHIELD | Admitting: *Deleted

## 2015-09-25 DIAGNOSIS — E785 Hyperlipidemia, unspecified: Secondary | ICD-10-CM | POA: Diagnosis not present

## 2015-09-25 LAB — LIPID PANEL
Cholesterol: 166 mg/dL (ref 125–200)
HDL: 77 mg/dL (ref >=46)
LDL Cholesterol: 72 mg/dL (ref <130)
Total CHOL/HDL Ratio: 2.2 Ratio (ref <=5.0)
Triglycerides: 87 mg/dL (ref <150)
VLDL: 17 mg/dL (ref <30)

## 2015-09-26 ENCOUNTER — Other Ambulatory Visit: Payer: Self-pay

## 2015-09-26 DIAGNOSIS — E785 Hyperlipidemia, unspecified: Secondary | ICD-10-CM

## 2015-10-20 ENCOUNTER — Other Ambulatory Visit: Payer: Self-pay

## 2015-10-20 DIAGNOSIS — Z1231 Encounter for screening mammogram for malignant neoplasm of breast: Secondary | ICD-10-CM

## 2015-11-24 ENCOUNTER — Ambulatory Visit
Admission: RE | Admit: 2015-11-24 | Discharge: 2015-11-24 | Disposition: A | Discharge status: Home / Self Care | Payer: BLUE CROSS/BLUE SHIELD | Source: Ambulatory Visit

## 2015-11-24 DIAGNOSIS — Z1231 Encounter for screening mammogram for malignant neoplasm of breast: Secondary | ICD-10-CM

## 2016-01-15 ENCOUNTER — Ambulatory Visit (INDEPENDENT_AMBULATORY_CARE_PROVIDER_SITE_OTHER): Payer: BLUE CROSS/BLUE SHIELD | Admitting: Nurse Practitioner

## 2016-01-15 ENCOUNTER — Encounter: Payer: Self-pay | Admitting: Nurse Practitioner

## 2016-01-15 VITALS — BP 122/78 | HR 68 | Ht 64.75 in | Wt 147.0 lb

## 2016-01-15 DIAGNOSIS — E559 Vitamin D deficiency, unspecified: Secondary | ICD-10-CM | POA: Diagnosis not present

## 2016-01-15 DIAGNOSIS — Z01419 Encounter for gynecological examination (general) (routine) without abnormal findings: Secondary | ICD-10-CM

## 2016-01-15 DIAGNOSIS — E039 Hypothyroidism, unspecified: Secondary | ICD-10-CM

## 2016-01-15 DIAGNOSIS — Z0000 Encounter for general adult medical examination without abnormal findings: Secondary | ICD-10-CM

## 2016-01-15 DIAGNOSIS — N952 Postmenopausal atrophic vaginitis: Secondary | ICD-10-CM

## 2016-01-15 MED ORDER — LEVOTHYROXINE SODIUM 50 MCG PO TABS: 50.0000 ug | ORAL_TABLET | Freq: Every day | ORAL | 4 refills | Status: DC

## 2016-01-15 NOTE — Progress Notes (Signed)
Patient ID: Rhonda Paul, female   DOB: 1951/05/12, 65 y.o.   MRN: [id number]  65 y.o. [id number] Married  Caucasian Fe here for annual exam. She is having lower suprapubic pressure on and off for about 6 months.  She was sen for UTI in March and was sure she had a UTI - the urine culture was negative.  Symptoms do wax and wain and are worse after SA.  She has been off vaginal estrogen for about a year.  After last years visit had a MI - "heart episode" with elevated enzymes.  Then had cardiac cath which was normal and no blockages.  Thought may be related to a 'plaque incident'.  She has a history of A Fib and has been on anticoagulants for yrs.  No new problems for this year.  Patient's last menstrual period was 11/08/2001 (approximate).          Sexually active: Yes.    The current method of family planning is post menopausal status.    Exercising: Yes.    Gym/ health club routine includes yoga class two days per week.  Treadmill 5 days per week. Smoker:  no  Health Maintenance: Pap: 01/13/15, Negative with negative HR HPV  MMG: 11/24/15, Bi-Rads 2: Negative  Colonoscopy: 11/2012 - 2 polyps and diverticula recheck in 5 years due to family history BMD: 01/20/14, T Score: -0.7 Spine / -0.3 Right Femur Neck / -0.5 Left Femur Neck  TDaP: 10/09/2010  Shingles: unsure, will check [facility] and PCP Pneumonia: Never Hep C and HIV: will do later Labs: TSH drawn today Urine: negative   reports that she has never smoked. She has never used smokeless tobacco. She reports that she drinks alcohol. She reports that she does not use drugs.  Past Medical History:  Diagnosis Date  . Adenocarcinoma of breast ([place]) 06/03   RIGHT  . Amenorrhea 09/01  . Amenorrhea   . Chemotherapy adverse reaction   . Chronic anticoagulation    Eliquis  . Diverticulitis 2009   diverticulitis  . Heart attack ([place])   . Hypertension   . Infertility, female 09/01   4 SAB  . Metrorrhagia   . Migraine 09/01  . Permanent  atrial fibrillation ([place]) 04/01/2014  . STD (sexually transmitted disease) 1976   Hx of HSV  . Stroke ([place]) 01/03/15   s/p TPA R-MCA  . Vitamin D deficiency 07/2006    Past Surgical History:  Procedure Laterality Date  . APPENDECTOMY  1959  . BREAST SURGERY Right 11/2001   lumpectomy with sentinel node biopsy X 3 all negative, ER/PR negative , chemo 6 doses, and radiation daily X 6 weeks  . CARDIAC CATHETERIZATION N/A 01/16/2015   Procedure: Left Heart Cath and Coronary Angiography;  Surgeon: Belva Crome, MD; OM1 75%, RCA 30%, EF normal; spasm in RCA w/ catheter engagement   . COLONOSCOPY  11/2012   diverticulitis, 2 polyps negative, repeat in 5 years  . RADIOLOGY WITH ANESTHESIA N/A 01/03/2015   Procedure: RADIOLOGY WITH ANESTHESIA;  Surgeon: Medication Radiologist, MD;  Location: [facility];  Service: Radiology;  Laterality: N/A;    Current Outpatient Prescriptions  Medication Sig Dispense Refill  . acetaminophen (TYLENOL) 500 MG tablet Take 1,000 mg by mouth every 6 (six) hours as needed for moderate pain or headache. Reported on 09/05/2015    . apixaban (ELIQUIS) 5 MG TABS tablet Take 1 tablet (5 mg total) by mouth 2 (two) times daily. 60 tablet 10  . atorvastatin (LIPITOR) 40  MG tablet Take 1 tablet (40 mg total) by mouth daily at 6 PM. 60 tablet 11  . cephALEXin (KEFLEX) 500 MG capsule   0  . levothyroxine (SYNTHROID, LEVOTHROID) 50 MCG tablet Take 50 mcg by mouth daily before breakfast.  6  . metoprolol succinate (TOPROL-XL) 50 MG 24 hr tablet Take 50 mg by mouth daily.     . nitroGLYCERIN (NITROSTAT) 0.4 MG SL tablet Place 1 tablet (0.4 mg total) under the tongue every 5 (five) minutes as needed for chest pain. (Patient not taking: Reported on 09/05/2015) 25 tablet 3   No current facility-administered medications for this visit.     Family History  Problem Relation Age of Onset  . Colon cancer Mother   . Cervical cancer Mother   . Osteoporosis Mother   . Cancer Mother   . Heart  disease Father   . Multiple births Maternal Grandmother   . Multiple births Paternal Grandmother   . Heart disease Brother   . Breast cancer Maternal Aunt   . Cancer Maternal Aunt     ROS:  Pertinent items are noted in HPI.  Otherwise, a comprehensive ROS was negative.  Exam:   LMP 11/08/2001 (Approximate)    Ht Readings from Last 3 Encounters:  09/05/15 5\' 5"  (1.651 m)  07/26/15 5\' 5"  (1.651 m)  03/01/15 5\' 5"  (1.651 m)    General appearance: alert, cooperative and appears stated age Head: Normocephalic, without obvious abnormality, atraumatic Neck: no adenopathy, supple, symmetrical, trachea midline and thyroid normal to inspection and palpation Lungs: clear to auscultation bilaterally Breasts: normal appearance, no masses or tenderness, on the dleft.  On the right she has surgical and radiation changes.   Heart: regular rate and rhythm Abdomen: soft, non-tender; no masses,  no organomegaly Extremities: extremities normal, atraumatic, no cyanosis or edema Skin: Skin color, texture, turgor normal. No rashes or lesions Lymph nodes: Cervical, supraclavicular, and axillary nodes normal. No abnormal inguinal nodes palpated Neurologic: Grossly normal   Pelvic: External genitalia:  no lesions              Urethra:  normal appearing urethra with no masses, tenderness or lesions              Bartholin's and Skene's: normal                 Vagina: normal appearing vagina with normal color and discharge, no lesions              Cervix: anteverted              Pap taken: No. Bimanual Exam:  Uterus:  normal size, contour, position, consistency, mobility, non-tender              Adnexa: no mass, fullness, tenderness               Rectovaginal: Confirms               Anus:  normal sphincter tone, no lesions  Chaperone present: yes  A:  Well Woman with normal exam             Postmenopausal Atrophic vaginitis off Estring X 1 yr.  Urethritis symptoms              Right  Breast cancer stage 2 ER/PR neg with lumpectomy 11/2001, treated with   chemotherapy and radiation             Hypothyroid on replacememt  MI with negative cardiac cath  01/2015   P:   Reviewed health and wellness pertinent to exam  Pap smear as above  Mammogram is due 11/2016  Refill Synthroid and follow with labs  Will follow Vit D  Recommend OTC Azo prn and cranberry tablets for prevention of UTI symptoms   Counseled on breast self exam, mammography screening, adequate intake of calcium and vitamin D, diet and exercise, Kegel's exercises return annually or prn  An After Visit Summary was printed and given to the patient.

## 2016-01-15 NOTE — Progress Notes (Signed)
Reviewed personally.  M. Suzanne Jennise Both, MD.  

## 2016-01-15 NOTE — Patient Instructions (Addendum)

## 2016-01-16 LAB — VITAMIN D 25 HYDROXY (VIT D DEFICIENCY, FRACTURES): Vit D, 25-Hydroxy: 38 ng/mL (ref 30–100)

## 2016-01-16 LAB — TSH: TSH: 2.26 mIU/L

## 2016-02-01 ENCOUNTER — Ambulatory Visit (HOSPITAL_BASED_OUTPATIENT_CLINIC_OR_DEPARTMENT_OTHER): Payer: BLUE CROSS/BLUE SHIELD | Admitting: Hematology & Oncology

## 2016-02-01 ENCOUNTER — Encounter: Payer: Self-pay | Admitting: Hematology & Oncology

## 2016-02-01 ENCOUNTER — Other Ambulatory Visit (HOSPITAL_BASED_OUTPATIENT_CLINIC_OR_DEPARTMENT_OTHER): Payer: BLUE CROSS/BLUE SHIELD

## 2016-02-01 VITALS — BP 126/80 | HR 61 | Temp 98.0°F | Resp 18 | Ht 64.75 in | Wt 147.0 lb

## 2016-02-01 DIAGNOSIS — E785 Hyperlipidemia, unspecified: Secondary | ICD-10-CM

## 2016-02-01 DIAGNOSIS — C50219 Malignant neoplasm of upper-inner quadrant of unspecified female breast: Secondary | ICD-10-CM

## 2016-02-01 DIAGNOSIS — Z853 Personal history of malignant neoplasm of breast: Secondary | ICD-10-CM

## 2016-02-01 DIAGNOSIS — C50912 Malignant neoplasm of unspecified site of left female breast: Secondary | ICD-10-CM

## 2016-02-01 DIAGNOSIS — E559 Vitamin D deficiency, unspecified: Secondary | ICD-10-CM

## 2016-02-01 LAB — COMPREHENSIVE METABOLIC PANEL
ALT: 25 U/L (ref 0–55)
AST: 22 U/L (ref 5–34)
Albumin: 4 g/dL (ref 3.5–5.0)
Alkaline Phosphatase: 59 U/L (ref 40–150)
Anion Gap: 9 mEq/L (ref 3–11)
BUN: 12.7 mg/dL (ref 7.0–26.0)
CO2: 27 mEq/L (ref 22–29)
Calcium: 10 mg/dL (ref 8.4–10.4)
Chloride: 104 mEq/L (ref 98–109)
Creatinine: 0.8 mg/dL (ref 0.6–1.1)
EGFR: 74 mL/min/{1.73_m2} — ABNORMAL LOW (ref >90)
Glucose: 98 mg/dl (ref 70–140)
Potassium: 4.8 mEq/L (ref 3.5–5.1)
Sodium: 140 mEq/L (ref 136–145)
Total Bilirubin: 0.93 mg/dL (ref 0.20–1.20)
Total Protein: 7.9 g/dL (ref 6.4–8.3)

## 2016-02-01 LAB — CBC WITH DIFFERENTIAL (CANCER CENTER ONLY)
BASO#: 0 10*3/uL (ref 0.0–0.2)
BASO%: 0.5 % (ref 0.0–2.0)
EOS%: 1.7 % (ref 0.0–7.0)
Eosinophils Absolute: 0.1 10*3/uL (ref 0.0–0.5)
HCT: 40.1 % (ref 34.8–46.6)
HGB: 13.7 g/dL (ref 11.6–15.9)
LYMPH#: 2.2 10*3/uL (ref 0.9–3.3)
LYMPH%: 37.2 % (ref 14.0–48.0)
MCH: 34.6 pg — ABNORMAL HIGH (ref 26.0–34.0)
MCHC: 34.2 g/dL (ref 32.0–36.0)
MCV: 101 fL (ref 81–101)
MONO#: 0.6 10*3/uL (ref 0.1–0.9)
MONO%: 9.3 % (ref 0.0–13.0)
NEUT#: 3 10*3/uL (ref 1.5–6.5)
NEUT%: 51.3 % (ref 39.6–80.0)
Platelets: 215 10*3/uL (ref 145–400)
RBC: 3.96 10*6/uL (ref 3.70–5.32)
RDW: 12.4 % (ref 11.1–15.7)
WBC: 5.9 10*3/uL (ref 3.9–10.0)

## 2016-02-01 NOTE — Progress Notes (Signed)
Hematology and Oncology Follow Up Visit  Rhonda Paul [id number] 06-23-1950 65 y.o. 02/01/2016   Principle Diagnosis:  Stage IIA (T2 N0 M0) ductal carcinoma of the right breast.  Current Therapy:    Observation     Interim History:  Ms.  Paul is back for her yearly followup. She's doing well. She has had no issues with respect to the atrial fibrillation. She is on ELIQUIS. She is doing well with ELIQUIS.  She is still working. She is quite busy at work.  She's had no issues with cough or shortness of breath. She's had no nausea or vomiting. She's had no change in bowel or bladder habits.  She had a mammogram done back in June. Everything looked fine.   She is exercising. Her lower back does not hurt as much.   Her appetite is good.  As all his, she has been up in [place] for part of the summer. She has family up there. She really enjoys going up there.   Overall, her performance status is ECOG 0.   Medications:  Current Outpatient Prescriptions:  .  acetaminophen (TYLENOL) 500 MG tablet, Take 1,000 mg by mouth every 6 (six) hours as needed for moderate pain or headache. Reported on 09/05/2015, Disp: , Rfl:  .  apixaban (ELIQUIS) 5 MG TABS tablet, Take 1 tablet (5 mg total) by mouth 2 (two) times daily., Disp: 60 tablet, Rfl: 10 .  atorvastatin (LIPITOR) 40 MG tablet, Take 1 tablet (40 mg total) by mouth daily at 6 PM., Disp: 60 tablet, Rfl: 11 .  levothyroxine (SYNTHROID, LEVOTHROID) 50 MCG tablet, Take 1 tablet (50 mcg total) by mouth daily before breakfast., Disp: 90 tablet, Rfl: 4 .  metoprolol succinate (TOPROL-XL) 50 MG 24 hr tablet, Take 50 mg by mouth daily. , Disp: , Rfl:  .  nitroGLYCERIN (NITROSTAT) 0.4 MG SL tablet, Place 1 tablet (0.4 mg total) under the tongue every 5 (five) minutes as needed for chest pain., Disp: 25 tablet, Rfl: 3  Allergies:  Allergies  Allergen Reactions  . Lambs Quarters Hives, Itching and Nausea And Vomiting  . Lanolin Acid  [Lanolin] Rash  . Other Itching and Rash    wool    Past Medical History, Surgical history, Social history, and Family History were reviewed and updated.  Review of Systems: As above  Physical Exam:  height is 5' 4.75" (1.645 m) and weight is 147 lb (66.7 kg). Her oral temperature is 98 F (36.7 C). Her blood pressure is 126/80 and her pulse is 61. Her respiration is 18.    well-developed and well-nourished white female. Head and neck exam shows no ocular or oral lesions. She has no palpable cervical or supraclavicular lymph nodes. Lungs are clear. Cardiac exam irregular rate and rhythm consistent with atrial fibrillation. with no murmurs rubs or bruits. Breast exam shows left breast with no masses, edema or erythema. There is no left axillary adenopathy. Right breast shows a well-healed lumpectomy at the 12:00 position. She has some slight contraction from radiation and surgery of the right breast. No distinct masses noted. There is no right axillary adenopathy. Abdomen is soft. She has good bowel sounds. There is no fluid wave. There is no palpable liver or spleen tip. Back exam shows no tenderness over the spine, ribs or hips. Extremities shows no clubbing cyanosis or edema. Neurological exam shows no focal neurological deficits.  Lab Results  Component Value Date   WBC 5.9 02/01/2016   HGB 13.7 02/01/2016  HCT 40.1 02/01/2016   MCV 101 02/01/2016   PLT 215 02/01/2016     Chemistry      Component Value Date/Time   NA 140 03/15/2015 0829   NA 139 02/01/2015 0850   K 4.6 03/15/2015 0829   K 5.3 (H) 02/01/2015 0850   CL 105 03/15/2015 0829   CO2 27 03/15/2015 0829   CO2 26 02/01/2015 0850   BUN 13 03/15/2015 0829   BUN 10.1 02/01/2015 0850   CREATININE 0.78 03/15/2015 0829   CREATININE 0.9 02/01/2015 0850      Component Value Date/Time   CALCIUM 9.1 03/15/2015 0829   CALCIUM 9.7 02/01/2015 0850   ALKPHOS 45 03/15/2015 0829   ALKPHOS 56 02/01/2015 0850   AST 20 03/15/2015  0829   AST 23 02/01/2015 0850   ALT 24 03/15/2015 0829   ALT 30 02/01/2015 0850   BILITOT 0.9 03/15/2015 0829   BILITOT 0.75 02/01/2015 0850         Impression and Plan: Rhonda Paul is a 65 year old female with a history of stage IIA invasive ductal carcinoma right breast. She was diagnosed about 14 years ago. Her tumor was ER negative. She received 6 cycles of FEC. She then received radiation.  She has atrial fibrillation. She had the stroke. She now is on ELIQUIS. She is doing great. She looks good from my point of view.In reality, she really has not changed over the time that I have seen her.  We will continue to follow her along. I really don't think that she will have problems with recurrent breast cancer.  I will pray real hard for her brother-in-law who has recurrent colon cancer.  We'll see her back in one year.   Volanda Napoleon, MD 8/24/20179:41 AM

## 2016-02-02 LAB — VITAMIN D 25 HYDROXY (VIT D DEFICIENCY, FRACTURES): Vitamin D, 25-Hydroxy: 45.6 ng/mL (ref 30.0–100.0)

## 2016-02-04 ENCOUNTER — Other Ambulatory Visit: Payer: Self-pay | Admitting: Physician Assistant

## 2016-02-04 DIAGNOSIS — C50912 Malignant neoplasm of unspecified site of left female breast: Secondary | ICD-10-CM

## 2016-02-04 DIAGNOSIS — E559 Vitamin D deficiency, unspecified: Secondary | ICD-10-CM

## 2016-04-01 ENCOUNTER — Other Ambulatory Visit: Payer: Self-pay | Admitting: Interventional Cardiology

## 2016-04-04 ENCOUNTER — Telehealth: Payer: Self-pay | Admitting: Interventional Cardiology

## 2016-04-04 NOTE — Telephone Encounter (Signed)
Informed pt ok to have labs drawn prior to appt.  Scheduled pt to have labs drawn on 07/25/16.

## 2016-04-04 NOTE — Telephone Encounter (Signed)
New message      Pt is due to see Dr Tamala Julian on 07-29-16.  She want to know if she can her her labs drawn prior to appt so that it can be discussed at the ov?  Please call

## 2016-07-25 ENCOUNTER — Other Ambulatory Visit (INDEPENDENT_AMBULATORY_CARE_PROVIDER_SITE_OTHER): Payer: BLUE CROSS/BLUE SHIELD

## 2016-07-25 ENCOUNTER — Other Ambulatory Visit: Payer: Self-pay | Admitting: *Deleted

## 2016-07-25 DIAGNOSIS — E785 Hyperlipidemia, unspecified: Secondary | ICD-10-CM

## 2016-07-25 LAB — LIPID PANEL
Chol/HDL Ratio: 2.1 ratio units (ref 0.0–4.4)
Cholesterol, Total: 174 mg/dL (ref 100–199)
HDL: 83 mg/dL (ref >39)
LDL Calculated: 72 mg/dL (ref 0–99)
Triglycerides: 93 mg/dL (ref 0–149)
VLDL Cholesterol Cal: 19 mg/dL (ref 5–40)

## 2016-07-25 LAB — HEPATIC FUNCTION PANEL
ALT: 23 IU/L (ref 0–32)
AST: 22 IU/L (ref 0–40)
Albumin: 4.4 g/dL (ref 3.6–4.8)
Alkaline Phosphatase: 50 IU/L (ref 39–117)
Bilirubin Total: 0.4 mg/dL (ref 0.0–1.2)
Bilirubin, Direct: 0.14 mg/dL (ref 0.00–0.40)
Total Protein: 6.9 g/dL (ref 6.0–8.5)

## 2016-07-25 NOTE — Addendum Note (Signed)
Addended by: Velna Ochs on: 07/25/2016 09:19 AM   Modules accepted: Orders

## 2016-07-25 NOTE — Addendum Note (Signed)
Addended by: Velna Ochs on: 07/25/2016 09:17 AM   Modules accepted: Orders

## 2016-07-26 ENCOUNTER — Telehealth: Payer: Self-pay | Admitting: *Deleted

## 2016-07-26 NOTE — Telephone Encounter (Signed)
Pt notified of lab results by phone with verbal understanding. Pt agreeable to plan of care to repeat labs in 1 year.

## 2016-07-28 DIAGNOSIS — I2510 Atherosclerotic heart disease of native coronary artery without angina pectoris: Secondary | ICD-10-CM | POA: Insufficient documentation

## 2016-07-29 ENCOUNTER — Ambulatory Visit (INDEPENDENT_AMBULATORY_CARE_PROVIDER_SITE_OTHER): Payer: BLUE CROSS/BLUE SHIELD | Admitting: Interventional Cardiology

## 2016-07-29 ENCOUNTER — Encounter: Payer: Self-pay | Admitting: Interventional Cardiology

## 2016-07-29 VITALS — BP 106/68 | HR 71 | Ht 65.0 in | Wt 148.8 lb

## 2016-07-29 DIAGNOSIS — I2510 Atherosclerotic heart disease of native coronary artery without angina pectoris: Secondary | ICD-10-CM | POA: Diagnosis not present

## 2016-07-29 DIAGNOSIS — Z7901 Long term (current) use of anticoagulants: Secondary | ICD-10-CM

## 2016-07-29 DIAGNOSIS — I4821 Permanent atrial fibrillation: Secondary | ICD-10-CM

## 2016-07-29 DIAGNOSIS — I482 Chronic atrial fibrillation: Secondary | ICD-10-CM

## 2016-07-29 DIAGNOSIS — E784 Other hyperlipidemia: Secondary | ICD-10-CM | POA: Diagnosis not present

## 2016-07-29 DIAGNOSIS — I340 Nonrheumatic mitral (valve) insufficiency: Secondary | ICD-10-CM

## 2016-07-29 DIAGNOSIS — E7849 Other hyperlipidemia: Secondary | ICD-10-CM

## 2016-07-29 NOTE — Patient Instructions (Signed)

## 2016-07-29 NOTE — Progress Notes (Signed)
Cardiology Office Note    Date:  07/29/2016   ID:  [name], DOB [birth year], MRN [id number]  PCP:  Tawanna Solo, MD  Cardiologist: Sinclair Grooms, MD   Chief Complaint  Patient presents with  . Atrial Fibrillation    History of Present Illness:  Rhonda Paul is a 66 y.o. female who presents for CAD, embolic CVA, permanent atrial fibrillation, non-ST elevation myocardial infarction due to plaque rupture, an chronic anticoagulation therapy.  She is doing well. She voices no specific cardiac or other complaints. There is one issue that has to do with the expense of anticoagulation therapy on Eliquis. There've been no bleeding complications. She denies headache and new neurological complaints.   Past Medical History:  Diagnosis Date  . Adenocarcinoma of breast ([place]) 06/03   RIGHT  . Amenorrhea 09/01  . Amenorrhea   . Chemotherapy adverse reaction   . Chronic anticoagulation    Eliquis  . Diverticulitis 2009   diverticulitis  . Heart attack   . Hypertension   . Infertility, female 09/01   4 SAB  . Metrorrhagia   . Migraine 09/01  . Permanent atrial fibrillation ([place]) 04/01/2014  . STD (sexually transmitted disease) 1976   Hx of HSV  . Stroke ([place]) 01/03/15   s/p TPA R-MCA  . Vitamin D deficiency 07/2006    Past Surgical History:  Procedure Laterality Date  . APPENDECTOMY  1959  . BREAST SURGERY Right 11/2001   lumpectomy with sentinel node biopsy X 3 all negative, ER/PR negative , chemo 6 doses, and radiation daily X 6 weeks  . CARDIAC CATHETERIZATION N/A 01/16/2015   Procedure: Left Heart Cath and Coronary Angiography;  Surgeon: Belva Crome, MD; OM1 75%, RCA 30%, EF normal; spasm in RCA w/ catheter engagement   . COLONOSCOPY  11/2012   diverticulitis, 2 polyps negative, repeat in 5 years  . RADIOLOGY WITH ANESTHESIA N/A 01/03/2015   Procedure: RADIOLOGY WITH ANESTHESIA;  Surgeon: Medication Radiologist, MD;  Location: [facility];  Service: Radiology;   Laterality: N/A;    Current Medications: Outpatient Medications Prior to Visit  Medication Sig Dispense Refill  . acetaminophen (TYLENOL) 500 MG tablet Take 1,000 mg by mouth every 6 (six) hours as needed for moderate pain or headache. Reported on 09/05/2015    . atorvastatin (LIPITOR) 40 MG tablet Take 1 tablet (40 mg total) by mouth daily at 6 PM. 60 tablet 11  . ELIQUIS 5 MG TABS tablet TAKE 1 TABLET (5 MG TOTAL) BY MOUTH 2 (TWO) TIMES DAILY. 60 tablet 5  . levothyroxine (SYNTHROID, LEVOTHROID) 50 MCG tablet Take 1 tablet (50 mcg total) by mouth daily before breakfast. 90 tablet 4  . metoprolol succinate (TOPROL-XL) 50 MG 24 hr tablet TAKE 1 TABLET (50 MG TOTAL) BY MOUTH DAILY. 90 tablet 1  . nitroGLYCERIN (NITROSTAT) 0.4 MG SL tablet Place 1 tablet (0.4 mg total) under the tongue every 5 (five) minutes as needed for chest pain. 25 tablet 3   No facility-administered medications prior to visit.      Allergies:   Lambs quarters; Lanolin acid [lanolin]; and Other   Social History   Social History  . Marital status: Married    Spouse name: N/A  . Number of children: N/A  . Years of education: N/A   Social History Main Topics  . Smoking status: Never Smoker  . Smokeless tobacco: Never Used     Comment: never used tobacco  . Alcohol use 0.0 oz/week  7 - 14 Glasses of wine per week  . Drug use: No  . Sexual activity: Yes    Partners: Male    Birth control/ protection: Post-menopausal   Other Topics Concern  . None   Social History Narrative  . None     Family History:  The patient's family history includes Breast cancer in her maternal aunt; Cancer in her maternal aunt and mother; Cervical cancer in her mother; Colon cancer in her mother; Heart disease in her brother and father; Multiple births in her maternal grandmother and paternal grandmother; Osteoporosis in her mother.   ROS:   Please see the history of present illness.    Complaining of the expense of  anticoagulation therapy. Otherwise no concerns.  All other systems reviewed and are negative.   PHYSICAL EXAM:   VS:  BP 106/68 (BP Location: Left Arm)   Pulse 71   Ht 5\' 5"  (1.651 m)   Wt 148 lb 12.8 oz (67.5 kg)   LMP 11/08/2001 (Approximate)   BMI 24.76 kg/m    GEN: Well nourished, well developed, in no acute distress  HEENT: normal  Neck: no JVD, carotid bruits, or masses Cardiac: IIRR, rubs, or gallops,no edema . 2/6 apical, left her sternal, and left axillary holosystolic murmur compatible with MR. Respiratory:  clear to auscultation bilaterally, normal work of breathing GI: soft, nontender, nondistended, + BS MS: no deformity or atrophy  Skin: warm and dry, no rash Neuro:  Alert and Oriented x 3, Strength and sensation are intact Psych: euthymic mood, full affect  Wt Readings from Last 3 Encounters:  07/29/16 148 lb 12.8 oz (67.5 kg)  02/01/16 147 lb (66.7 kg)  01/15/16 147 lb (66.7 kg)      Studies/Labs Reviewed:   EKG:  EKG  Atrial fibrillation, left axis deviation, Paul wave progression, nonspecific T wave flattening.  Recent Labs: 01/15/2016: TSH 2.26 02/01/2016: BUN 12.7; Creatinine 0.8; HGB 13.7; Platelets 215; Potassium 4.8; Sodium 140 07/25/2016: ALT 23   Lipid Panel    Component Value Date/Time   CHOL 174 07/25/2016 0919   TRIG 93 07/25/2016 0919   HDL 83 07/25/2016 0919   CHOLHDL 2.1 07/25/2016 0919   CHOLHDL 2.2 09/25/2015 0959   VLDL 17 09/25/2015 0959   LDLCALC 72 07/25/2016 0919    Additional studies/ records that were reviewed today include:  none    ASSESSMENT:    1. Permanent atrial fibrillation ([place])   2. Moderate mitral regurgitation   3. Chronic anticoagulation    4. Other hyperlipidemia   5. CAD in native artery      PLAN:  In order of problems listed above:  1. Controlled rate in atrial fibrillation. Asymptomatic. No new neurological complaints. 2. Clinically insignificant mitral regurgitation based upon exam and overall  clinical status. 3. Will switch from Eliquis to coumadin when she turns 66 that she has not able to afford NOAC. 4. Not addressed 5. Asymptomatic    Medication Adjustments/Labs and Tests Ordered: Current medicines are reviewed at length with the patient today.  Concerns regarding medicines are outlined above.  Medication changes, Labs and Tests ordered today are listed in the Patient Instructions below. Patient Instructions  Medication Instructions:  None  Labwork: None  Testing/Procedures: None  Follow-Up: Your physician wants you to follow-up in: 1 year with Dr. Tamala Julian.  You will receive a reminder letter in the mail two months in advance. If you don't receive a letter, please call our office to schedule the follow-up appointment.  Any Other Special Instructions Will Be Listed Below (If Applicable).     If you need a refill on your cardiac medications before your next appointment, please call your pharmacy.      Signed, Sinclair Grooms, MD  07/29/2016 1:18 PM    [facility] [address], [place], [place]  [address] Phone: [phone]; Fax: [phone]

## 2016-07-30 ENCOUNTER — Other Ambulatory Visit: Payer: Self-pay | Admitting: Interventional Cardiology

## 2016-08-08 ENCOUNTER — Other Ambulatory Visit: Payer: Self-pay | Admitting: Physician Assistant

## 2016-08-08 DIAGNOSIS — C50912 Malignant neoplasm of unspecified site of left female breast: Secondary | ICD-10-CM

## 2016-08-08 DIAGNOSIS — E559 Vitamin D deficiency, unspecified: Secondary | ICD-10-CM

## 2016-09-27 ENCOUNTER — Other Ambulatory Visit: Payer: Self-pay | Admitting: Interventional Cardiology

## 2016-10-10 ENCOUNTER — Other Ambulatory Visit: Payer: Self-pay | Admitting: Nurse Practitioner

## 2016-10-10 DIAGNOSIS — Z1231 Encounter for screening mammogram for malignant neoplasm of breast: Secondary | ICD-10-CM

## 2016-10-29 ENCOUNTER — Telehealth: Payer: Self-pay

## 2016-10-29 NOTE — Telephone Encounter (Signed)
Left message to call [place] at [phone].  Need to advise patient that we received notification from her [place] that the manufacturer of her Levothyroxine is changing. This will be the same medication at the same dose just different generic. Will need to monitor how she feels with new generic. If she has any changes in how she feels will need to contact the office and we will be happy to check her thyroid levels.

## 2016-10-30 NOTE — Telephone Encounter (Signed)
Spoke with patient. Advised of message as seen below after review with Dr.Miller. Patient is agreeable and verbalizes understanding.  Cc: Dr.Miller  Routing to provider for final review. Patient agreeable to disposition. Will close encounter.

## 2016-10-30 NOTE — Telephone Encounter (Signed)
Patient returned your call.

## 2016-11-25 ENCOUNTER — Ambulatory Visit
Admission: RE | Admit: 2016-11-25 | Discharge: 2016-11-25 | Disposition: A | Discharge status: Home / Self Care | Payer: BLUE CROSS/BLUE SHIELD | Source: Ambulatory Visit | Attending: Nurse Practitioner | Admitting: Nurse Practitioner

## 2016-11-25 DIAGNOSIS — Z1231 Encounter for screening mammogram for malignant neoplasm of breast: Secondary | ICD-10-CM

## 2016-11-25 HISTORY — DX: Personal history of irradiation: Z92.3

## 2016-11-25 HISTORY — DX: Personal history of antineoplastic chemotherapy: Z92.21

## 2016-11-28 ENCOUNTER — Telehealth: Payer: Self-pay | Admitting: Interventional Cardiology

## 2016-11-28 NOTE — Telephone Encounter (Signed)
New message     Pt needs to switch from Eliquis to warafrin, in July she will no longer be able to use discount card for the Eliquis when her Medicare kicks in.

## 2016-11-28 NOTE — Telephone Encounter (Signed)
Spoke with pt and she states that she needs to switch to Coumadin next month because she will go on Medicare and Eliquis will be too expensive at that time.  She plans to refill the Eliquis one last time next week and it should run out around the end of July. She prefers to have Coumadin done in our office vs her PCP.  Advised I would send message to our CVRR clinic and have them follow up with pt on getting her in to get switched.  Pt appreciative for call.

## 2016-11-29 MED ORDER — WARFARIN SODIUM 5 MG PO TABS: 5.0000 mg | ORAL_TABLET | ORAL | 1 refills | Status: DC

## 2016-11-29 NOTE — Telephone Encounter (Signed)
Spoke with pt in regards to switching from Eliquis to Warfarin. Discussed plan with Pharmacist-Kelly, pt will overlap Eliquis with Warfarin 5mg  on 7/21,7/22, and 7/23. On 7/24 only take 5mg  Warfarin in PM. Come in on 7/25 for INR check. Rx sent to [facility].

## 2016-12-25 ENCOUNTER — Telehealth: Payer: Self-pay | Admitting: Interventional Cardiology

## 2016-12-25 NOTE — Telephone Encounter (Signed)
Pt set up to switch to Coumadin this Saturday and see CVRR clinic next week.  Pt's Medicare went to effect on 7/1 and Eliquis is going to be expensive.  Pt wanted to make sure it was ok to switch to Coumadin or if Dr.Smith felt it was safer to stay on Eliquis she would find a way to afford it.  Advised pt ok to switch to Coumadin and to continue with plan to start that this weekend.  Pt verbalized understanding and was in agreement with this plan.

## 2016-12-25 NOTE — Telephone Encounter (Signed)
New message    Pt is calling stating that she has just went on Medicare. She would like to talk about if she should stay on Eliquis or switch to Coumadin. Please call.

## 2016-12-26 DIAGNOSIS — M21612 Bunion of left foot: Secondary | ICD-10-CM | POA: Diagnosis not present

## 2016-12-26 DIAGNOSIS — M205X1 Other deformities of toe(s) (acquired), right foot: Secondary | ICD-10-CM | POA: Diagnosis not present

## 2016-12-26 DIAGNOSIS — M79672 Pain in left foot: Secondary | ICD-10-CM | POA: Diagnosis not present

## 2016-12-26 DIAGNOSIS — M2022 Hallux rigidus, left foot: Secondary | ICD-10-CM | POA: Diagnosis not present

## 2016-12-27 ENCOUNTER — Telehealth: Payer: Self-pay | Admitting: Interventional Cardiology

## 2016-12-27 NOTE — Telephone Encounter (Signed)
Pt having foot surgery soon.  Not currently scheduled but she states they are looking at doing this possibly the first week of August.  She wants to know if she should go ahead and plan to switch to Coumadin or should she wait until after surgery?  Pt would need Eliquis samples if we hold off on switching.  Advised pt I would send message to our anticoag team as I am unsure of what she should do since surgery is possibly so soon.  Advised I would have anticoag team call her back.  Pt is suppose to start overlapping Eliquis and Coumadin tomorrow.

## 2016-12-27 NOTE — Telephone Encounter (Signed)
New Message    Should she wait to transition from Eliquis to Coumadin until after her surgery or can she do it before hand?

## 2016-12-27 NOTE — Telephone Encounter (Signed)
Spoke with patient who states she has 8 days of Eliquis left. She is pending surgical procedure for which she will need to stop anticoagulation. She does have a history of a CVA and thus would need a lovenox bridge if we transitioned to warfarin. Since procedure is pending for early Aug she will have only been on warfarin for about 2-3 weeks before anticoagulation would need to stop. Advised from a CV standpoint and coordination standpoint it may be easier for her to remain on Eliquis. We can provide her 4 weeks of samples to get her through until transition after surgical procedure. She is in agreement with this plan. She will come pick up samples next week. She is aware to call once has date for procedure so that transition can be coordinated after.

## 2017-01-08 ENCOUNTER — Telehealth: Payer: Self-pay | Admitting: Interventional Cardiology

## 2017-01-08 DIAGNOSIS — L720 Epidermal cyst: Secondary | ICD-10-CM | POA: Diagnosis not present

## 2017-01-08 DIAGNOSIS — I480 Paroxysmal atrial fibrillation: Secondary | ICD-10-CM | POA: Diagnosis not present

## 2017-01-08 DIAGNOSIS — M7711 Lateral epicondylitis, right elbow: Secondary | ICD-10-CM | POA: Diagnosis not present

## 2017-01-14 NOTE — Telephone Encounter (Signed)
Patient calling, states that Dr. Fritzi Mandes at [facility] office should have faxed over a surgical clearance form last Thursday for Dr. Tamala Julian and would like to see if you all have received it. Patient is aware that you have been out of the office.

## 2017-01-14 NOTE — Telephone Encounter (Signed)
Spoke with pt and made her aware that clearance has not been received.  She will contact office and have them send over form.

## 2017-01-16 ENCOUNTER — Other Ambulatory Visit: Payer: Self-pay

## 2017-01-16 NOTE — Telephone Encounter (Signed)
Medication refill request: levothyroxine 80mcg Last AEX:  01-15-16 Next AEX: 02-03-17 Last MMG (if hormonal medication request): 11-25-16 category b density birads 1:neg Refill authorized: pt needs refill to get to aex. Please approve if appropriate

## 2017-01-17 MED ORDER — LEVOTHYROXINE SODIUM 50 MCG PO TABS
50.0000 ug | ORAL_TABLET | Freq: Every day | ORAL | 0 refills | Status: DC
Start: 2017-01-17 — End: 2017-02-03

## 2017-01-21 ENCOUNTER — Ambulatory Visit: Payer: BLUE CROSS/BLUE SHIELD | Admitting: Nurse Practitioner

## 2017-01-30 ENCOUNTER — Other Ambulatory Visit (HOSPITAL_BASED_OUTPATIENT_CLINIC_OR_DEPARTMENT_OTHER): Payer: Medicare Other

## 2017-01-30 ENCOUNTER — Ambulatory Visit (HOSPITAL_BASED_OUTPATIENT_CLINIC_OR_DEPARTMENT_OTHER): Payer: Medicare Other | Admitting: Hematology & Oncology

## 2017-01-30 VITALS — BP 119/65 | HR 64 | Temp 98.2°F | Resp 16 | Wt 150.0 lb

## 2017-01-30 DIAGNOSIS — M818 Other osteoporosis without current pathological fracture: Secondary | ICD-10-CM | POA: Diagnosis not present

## 2017-01-30 DIAGNOSIS — C50219 Malignant neoplasm of upper-inner quadrant of unspecified female breast: Secondary | ICD-10-CM

## 2017-01-30 DIAGNOSIS — I482 Chronic atrial fibrillation: Secondary | ICD-10-CM | POA: Diagnosis not present

## 2017-01-30 DIAGNOSIS — Z7901 Long term (current) use of anticoagulants: Secondary | ICD-10-CM | POA: Diagnosis not present

## 2017-01-30 DIAGNOSIS — Z853 Personal history of malignant neoplasm of breast: Secondary | ICD-10-CM | POA: Diagnosis not present

## 2017-01-30 DIAGNOSIS — E782 Mixed hyperlipidemia: Secondary | ICD-10-CM | POA: Diagnosis not present

## 2017-01-30 DIAGNOSIS — I4821 Permanent atrial fibrillation: Secondary | ICD-10-CM

## 2017-01-30 LAB — CBC WITH DIFFERENTIAL (CANCER CENTER ONLY)
BASO#: 0.1 10*3/uL (ref 0.0–0.2)
BASO%: 0.9 % (ref 0.0–2.0)
EOS%: 1.8 % (ref 0.0–7.0)
Eosinophils Absolute: 0.1 10*3/uL (ref 0.0–0.5)
HCT: 39.1 % (ref 34.8–46.6)
HGB: 13.2 g/dL (ref 11.6–15.9)
LYMPH#: 2.5 10*3/uL (ref 0.9–3.3)
LYMPH%: 43.7 % (ref 14.0–48.0)
MCH: 34.9 pg — ABNORMAL HIGH (ref 26.0–34.0)
MCHC: 33.8 g/dL (ref 32.0–36.0)
MCV: 103 fL — ABNORMAL HIGH (ref 81–101)
MONO#: 0.5 10*3/uL (ref 0.1–0.9)
MONO%: 9.6 % (ref 0.0–13.0)
NEUT#: 2.5 10*3/uL (ref 1.5–6.5)
NEUT%: 44 % (ref 39.6–80.0)
Platelets: 210 10*3/uL (ref 145–400)
RBC: 3.78 10*6/uL (ref 3.70–5.32)
RDW: 12.6 % (ref 11.1–15.7)
WBC: 5.6 10*3/uL (ref 3.9–10.0)

## 2017-01-30 LAB — CMP (CANCER CENTER ONLY)
ALT(SGPT): 25 U/L (ref 10–47)
AST: 30 U/L (ref 11–38)
Albumin: 3.8 g/dL (ref 3.3–5.5)
Alkaline Phosphatase: 51 U/L (ref 26–84)
BUN, Bld: 11 mg/dL (ref 7–22)
CO2: 32 mEq/L (ref 18–33)
Calcium: 9.6 mg/dL (ref 8.0–10.3)
Chloride: 104 mEq/L (ref 98–108)
Creat: 1 mg/dl (ref 0.6–1.2)
Glucose, Bld: 104 mg/dL (ref 73–118)
Potassium: 4.9 mEq/L — ABNORMAL HIGH (ref 3.3–4.7)
Sodium: 142 mEq/L (ref 128–145)
Total Bilirubin: 1.1 mg/dl (ref 0.20–1.60)
Total Protein: 8 g/dL (ref 6.4–8.1)

## 2017-01-30 NOTE — Progress Notes (Signed)
Hematology and Oncology Follow Up Visit  KERA DEACON [id number] 03/08/1951 66 y.o. 01/30/2017   Principle Diagnosis:  Stage IIA (T2 N0 M0) ductal carcinoma of the right breast.  Current Therapy:    Observation     Interim History:  Ms.  Dingledine is back for follow-up. We see her yearly. Since we last saw her, she's done very well. She's had no problems with fatigue or weakness. She's had no nausea or vomiting.  She is on ELIQUIS for the atrial fibrillation. She's done well with ELIQUIS. She wants to switch over to Coumadin because she is now on Medicare. I told her that switching to Coumadin would be much more difficult and would be a much more of an inconvenience for her. I recommended that she stay on the [facility].  She is taking her vitamin D. She's not having any bony pain. She is doing yoga a couple times a week.  She is still working. She's had no problems with stress.  There is no change in bowel or bladder habits.  . Her last mammogram was in June of this year. I reviewed the mammogram. Everything looked fine.  Overall, her performance status is ECOG 0.   Medications:  Current Outpatient Prescriptions:  .  apixaban (ELIQUIS) 5 MG TABS tablet, Take 5 mg by mouth 2 (two) times daily., Disp: , Rfl:  .  acetaminophen (TYLENOL) 500 MG tablet, Take 1,000 mg by mouth every 6 (six) hours as needed for moderate pain or headache. Reported on 09/05/2015, Disp: , Rfl:  .  atorvastatin (LIPITOR) 40 MG tablet, TAKE 1 TABLET (40 MG TOTAL) BY MOUTH DAILY AT 6 PM., Disp: 30 tablet, Rfl: 11 .  Ergocalciferol (VITAMIN D2) 2000 units TABS, Take 2,000 Units by mouth daily., Disp: , Rfl:  .  levothyroxine (SYNTHROID, LEVOTHROID) 50 MCG tablet, Take 1 tablet (50 mcg total) by mouth daily before breakfast., Disp: 30 tablet, Rfl: 0 .  metoprolol succinate (TOPROL-XL) 50 MG 24 hr tablet, TAKE ONE TABLET BY MOUTH DAILY, Disp: 90 tablet, Rfl: 3 .  nitroGLYCERIN (NITROSTAT) 0.4 MG SL tablet, Place 1  tablet (0.4 mg total) under the tongue every 5 (five) minutes as needed for chest pain., Disp: 25 tablet, Rfl: 3  Allergies:  Allergies  Allergen Reactions  . Lambs Quarters Hives, Itching and Nausea And Vomiting  . Lanolin Acid [Lanolin] Rash  . Other Itching and Rash    wool    Past Medical History, Surgical history, Social history, and Family History were reviewed and updated.  Review of Systems: As above  Physical Exam:  weight is 150 lb (68 kg). Her oral temperature is 98.2 F (36.8 C). Her blood pressure is 119/65 and her pulse is 64. Her respiration is 16 and oxygen saturation is 100%.    Well-developed well-nourished white female. Head and neck exam shows no ocular or oral lesions. She has no palpable cervical or supraclavicular lymph nodes. Lungs are clear. Cardiac exam is irregular rate and rhythm consistent with atrial fibrillation. The rate is well controlled. Breast exam shows left breast with no masses, edema or erythema. There is no left axillary adenopathy. Right breast shows well-healed lumpectomy at the 10:00 position. There is no right breast masses. There is no right axillary adenopathy. Abdomen is soft. She has good bowel sounds. There is no fluid wave. There is no palpable liver or spleen tip. Extremities shows no clubbing, cyanosis or edema. She has no lymphedema of the right arm. Neurological exam shows no  focal neurological deficits.  Lab Results  Component Value Date   WBC 5.6 01/30/2017   HGB 13.2 01/30/2017   HCT 39.1 01/30/2017   MCV 103 (H) 01/30/2017   PLT 210 01/30/2017     Chemistry      Component Value Date/Time   NA 142 01/30/2017 0847   NA 140 02/01/2016 0841   K 4.9 (H) 01/30/2017 0847   K 4.8 02/01/2016 0841   CL 104 01/30/2017 0847   CO2 32 01/30/2017 0847   CO2 27 02/01/2016 0841   BUN 11 01/30/2017 0847   BUN 12.7 02/01/2016 0841   CREATININE 1.0 01/30/2017 0847   CREATININE 0.8 02/01/2016 0841      Component Value Date/Time    CALCIUM 9.6 01/30/2017 0847   CALCIUM 10.0 02/01/2016 0841   ALKPHOS 51 01/30/2017 0847   ALKPHOS 59 02/01/2016 0841   AST 30 01/30/2017 0847   AST 22 02/01/2016 0841   ALT 25 01/30/2017 0847   ALT 25 02/01/2016 0841   BILITOT 1.10 01/30/2017 0847   BILITOT 0.93 02/01/2016 0841         Impression and Plan: Ms. Mauri is a 66 year old female with a history of stage IIA invasive ductal carcinoma right breast. She was diagnosed about  15 years ago. Her tumor was ER negative. She received 6 cycles of FEC. She then received radiation.  She really looks great. She is exercising. I'm so happy for her. She is still working.  We will continue to see her back yearly. I don't see a problem with that. She was come back earlier if she has any problems.  I talked to her at length about staying on ELIQUIS. She is on Medicare now. She is going to switch over to Coumadin. I really don't think that this would be a good choice for her. ELIQUIS would be much easier. She's had no problems with the ELIQUIS. Marland Kitchen   Volanda Napoleon, MD 8/23/201810:48 AM

## 2017-01-31 ENCOUNTER — Encounter: Payer: Self-pay | Admitting: *Deleted

## 2017-01-31 LAB — VITAMIN D 25 HYDROXY (VIT D DEFICIENCY, FRACTURES): Vitamin D, 25-Hydroxy: 70 ng/mL (ref 30.0–100.0)

## 2017-02-03 ENCOUNTER — Ambulatory Visit (INDEPENDENT_AMBULATORY_CARE_PROVIDER_SITE_OTHER): Payer: Medicare Other | Admitting: Obstetrics & Gynecology

## 2017-02-03 ENCOUNTER — Encounter: Payer: Self-pay | Admitting: Obstetrics & Gynecology

## 2017-02-03 VITALS — BP 120/74 | HR 62 | Resp 14 | Ht 64.75 in | Wt 150.0 lb

## 2017-02-03 DIAGNOSIS — Z205 Contact with and (suspected) exposure to viral hepatitis: Secondary | ICD-10-CM

## 2017-02-03 DIAGNOSIS — Z124 Encounter for screening for malignant neoplasm of cervix: Secondary | ICD-10-CM

## 2017-02-03 DIAGNOSIS — Z01419 Encounter for gynecological examination (general) (routine) without abnormal findings: Secondary | ICD-10-CM

## 2017-02-03 DIAGNOSIS — E039 Hypothyroidism, unspecified: Secondary | ICD-10-CM

## 2017-02-03 MED ORDER — LEVOTHYROXINE SODIUM 50 MCG PO TABS: 50.0000 ug | ORAL_TABLET | Freq: Every day | ORAL | 12 refills | Status: DC

## 2017-02-03 NOTE — Progress Notes (Signed)
66 y.o. G27P0040 Married Caucasian F here for annual exam.  Reports she has some bladder pressure.  This is intermittent and when it happens she feels like the beginning of a UTI.  This is not related to any other urinary symptom.    Denies vaginal bleeding.    PCP:  Dr. Kathyrn Lass, MD  Patient's last menstrual period was 11/08/2001 (approximate).          Sexually active: Yes.    The current method of family planning is post menopausal status.    Exercising: Yes.    yoga, treadmill Smoker:  no  Health Maintenance: Pap:  01/13/15 negative, HR HPV negative  History of abnormal Pap:  no MMG:  11/25/16 BIRADS 1 negative  Colonoscopy:  11/2012- polyps- repeat 5 years  BMD:   01/20/14 normal- repeat 5 years  TDaP:  10/09/10   Pneumonia vaccine(s):  never Zostavax:   never Hep C testing: discuss with provider  Screening Labs: Done recently with Oncologist, Hb today: same   reports that she has never smoked. She has never used smokeless tobacco. She reports that she drinks alcohol. She reports that she does not use drugs.  Past Medical History:  Diagnosis Date  . Adenocarcinoma of breast ([place]) 06/03   RIGHT  . Amenorrhea 09/01  . Amenorrhea   . Chemotherapy adverse reaction   . Chronic anticoagulation    Eliquis  . Diverticulitis 2009   diverticulitis  . Heart attack ([place])   . Hypertension   . Infertility, female 09/01   4 SAB  . Metrorrhagia   . Migraine 09/01  . Permanent atrial fibrillation ([place]) 04/01/2014  . Personal history of chemotherapy 2003  . Personal history of radiation therapy 2003  . STD (sexually transmitted disease) 1976   Hx of HSV  . Stroke ([place]) 01/03/15   s/p TPA R-MCA  . Vitamin D deficiency 07/2006    Past Surgical History:  Procedure Laterality Date  . APPENDECTOMY  1959  . BREAST SURGERY Right 11/2001   lumpectomy with sentinel node biopsy X 3 all negative, ER/PR negative , chemo 6 doses, and radiation daily X 6 weeks  . CARDIAC CATHETERIZATION N/A  01/16/2015   Procedure: Left Heart Cath and Coronary Angiography;  Surgeon: Belva Crome, MD; OM1 75%, RCA 30%, EF normal; spasm in RCA w/ catheter engagement   . COLONOSCOPY  11/2012   diverticulitis, 2 polyps negative, repeat in 5 years  . RADIOLOGY WITH ANESTHESIA N/A 01/03/2015   Procedure: RADIOLOGY WITH ANESTHESIA;  Surgeon: Medication Radiologist, MD;  Location: [facility];  Service: Radiology;  Laterality: N/A;    Current Outpatient Prescriptions  Medication Sig Dispense Refill  . acetaminophen (TYLENOL) 500 MG tablet Take 1,000 mg by mouth every 6 (six) hours as needed for moderate pain or headache. Reported on 09/05/2015    . apixaban (ELIQUIS) 5 MG TABS tablet Take 5 mg by mouth 2 (two) times daily.    Marland Kitchen atorvastatin (LIPITOR) 40 MG tablet TAKE 1 TABLET (40 MG TOTAL) BY MOUTH DAILY AT 6 PM. 30 tablet 11  . Ergocalciferol (VITAMIN D2) 2000 units TABS Take 2,000 Units by mouth daily.    Marland Kitchen levothyroxine (SYNTHROID, LEVOTHROID) 50 MCG tablet Take 1 tablet (50 mcg total) by mouth daily before breakfast. 30 tablet 0  . metoprolol succinate (TOPROL-XL) 50 MG 24 hr tablet TAKE ONE TABLET BY MOUTH DAILY 90 tablet 3  . nitroGLYCERIN (NITROSTAT) 0.4 MG SL tablet Place 1 tablet (0.4 mg total) under the  tongue every 5 (five) minutes as needed for chest pain. 25 tablet 3   No current facility-administered medications for this visit.     Family History  Problem Relation Age of Onset  . Colon cancer Mother   . Cervical cancer Mother   . Osteoporosis Mother   . Cancer Mother   . Heart disease Father   . Heart disease Brother   . Breast cancer Maternal Aunt   . Cancer Maternal Aunt   . Multiple births Maternal Grandmother   . Multiple births Paternal Grandmother     ROS:  Pertinent items are noted in HPI.  Otherwise, a comprehensive ROS was negative.  Exam:   BP 120/74 (BP Location: Left Arm, Patient Position: Sitting, Cuff Size: Normal)   Pulse 62   Resp 14   Ht 5' 4.75" (1.645 m)   Wt 150  lb (68 kg)   LMP 11/08/2001 (Approximate)   BMI 25.15 kg/m   Weight change: +3#   Height: 5' 4.75" (164.5 cm)  Ht Readings from Last 3 Encounters:  02/03/17 5' 4.75" (1.645 m)  07/29/16 5\' 5"  (1.651 m)  02/01/16 5' 4.75" (1.645 m)    General appearance: alert, cooperative and appears stated age Head: Normocephalic, without obvious abnormality, atraumatic Neck: no adenopathy, supple, symmetrical, trachea midline and thyroid normal to inspection and palpation Lungs: clear to auscultation bilaterally Breasts: normal appearance, no masses or tenderness, well healed right breast scars Heart: regular rate and rhythm Abdomen: soft, non-tender; bowel sounds normal; no masses,  no organomegaly Extremities: extremities normal, atraumatic, no cyanosis or edema Skin: Skin color, texture, turgor normal. No rashes or lesions Lymph nodes: Cervical, supraclavicular, and axillary nodes normal. No abnormal inguinal nodes palpated Neurologic: Grossly normal   Pelvic: External genitalia:  no lesions              Urethra:  normal appearing urethra with no masses, tenderness or lesions              Bartholins and Skenes: normal                 Vagina: normal appearing vagina with normal color and discharge, no lesions              Cervix: normal appearance, no masses or tenderness, well healed right breast scars              Pap taken: No. Bimanual Exam:  Uterus:  normal size, contour, position, consistency, mobility, non-tender              Adnexa: normal adnexa and no mass, fullness, tenderness               Rectovaginal: Confirms               Anus:  normal sphincter tone, no lesions  Chaperone was present for exam.  A:  Well Woman with normal exam PMP. No HRT H/O atrophic vaginitis, used Estring in the past but not longer due to her CVA IIA, triple negative.  6/03.  Treated with chemotherapy and radiation Hypothyroidism H/O MI and CVA 8/16  P:   Mammogram guidelines reviewed pap smear and  HR HPV 8/16.  No pap smear obtained today.  TSH obtained today  Rx Synthroid 53mcq qday.  #30/12RF Order for shingrix vaccination given to pt today Hep C antibody obtained Return annually or prn

## 2017-02-04 LAB — HEPATITIS C ANTIBODY: Hep C Virus Ab: <0.1 s/co ratio (ref 0.0–0.9)

## 2017-02-04 LAB — TSH: TSH: 2.26 u[IU]/mL (ref 0.450–4.500)

## 2017-02-08 HISTORY — PX: JOINT REPLACEMENT: SHX530

## 2017-02-19 ENCOUNTER — Telehealth: Payer: Self-pay | Admitting: Interventional Cardiology

## 2017-02-19 NOTE — Telephone Encounter (Signed)
F/u Message ° °Pt returning RN call .please call back to discuss  °

## 2017-02-19 NOTE — Telephone Encounter (Signed)
LEFT VOICE MAIL WILL FORWARD TO DR Tamala Julian FOR REVIEW AND RECOMMENDATIONS AWARE WILL CALL BACK ONCE ANSWERS RECEIVED .Adonis Housekeeper

## 2017-02-19 NOTE — Telephone Encounter (Signed)
PT AWARE AWAITING DR SMITH'S RESPONSE .Rhonda Paul

## 2017-02-19 NOTE — Telephone Encounter (Signed)
New message      Pt c/o medication issue:  1. Name of Medication:  Atorvastatin--eliquis 2. How are you currently taking this medication (dosage and times per day)?  40mg  daily 3. Are you having a reaction (difficulty breathing--STAT)?  no 4. What is your medication issue?  Pt want to switch to generic crestor because of muscle pain in legs and arms.  Is this ok with Dr Tamala Julian? Also, pt want Dr Tamala Julian to know she has decided to stay on eliquis instead of switching  to coumadin.  Also, calling to check on surgical clearance for toe surgery from Dr Rosemary Holms and what to do with her eliquis?

## 2017-02-20 NOTE — Telephone Encounter (Signed)
   It is okay to switch to generic Crestor (rosuvastatin) 20 mg daily.  Will likely need Lovenox bridge for surgery by Dr. Fritzi Mandes, since she has chronic A. Fib and  prior history of embolic CVA. Please have Coumadin clinic to manage. What is the planned surgery?

## 2017-02-21 ENCOUNTER — Telehealth: Payer: Self-pay | Admitting: Interventional Cardiology

## 2017-02-21 MED ORDER — ROSUVASTATIN CALCIUM 20 MG PO TABS: 20.0000 mg | ORAL_TABLET | Freq: Every day | ORAL | 3 refills | Status: DC

## 2017-02-21 NOTE — Telephone Encounter (Signed)
Noted hx of chronic atrial fibrillation and embolic CVA.  CHADS2-VA2sc score of 6    Patient taking eliquis 5mg  twice daily. No need for lovenox injection at this time. Patient may be able to hold eliquis only 24- 48 prior to procedure to minimize potential for stoke/VTE.   No request for clearance received form Dr Melven Sartorius office yet. LMOM [phone] to conform procedure.    Recommendations given to patient today: 1. Continue taking Eliquis 5mg  twice daily 2. Hold 2 doses on 03/02/2017 - procedure 9/24 at 9am

## 2017-02-21 NOTE — Telephone Encounter (Signed)
Spoke with Rhonda Paul and made her aware that we are replacing Lipitor with Crestor.  Rhonda Paul appreciative for call.

## 2017-02-21 NOTE — Telephone Encounter (Signed)
°  New Prob  Calling to verify Crestor is replacing Lipitor. Please call.

## 2017-02-21 NOTE — Telephone Encounter (Signed)
Thanks

## 2017-02-21 NOTE — Telephone Encounter (Signed)
F/u message ° °Pt returning RN call .please call back to discuss  °

## 2017-02-21 NOTE — Telephone Encounter (Signed)
Left message to call back  

## 2017-02-21 NOTE — Telephone Encounter (Signed)
Spoke with pt and let her know that Dr. Tamala Julian said ok to switch to Rosuvastatin 20mg  QD.  Sent prescription to preferred pharmacy.    Advised pt Dr. Tamala Julian states that she may need Lovenox bridging.  Advised I would send message to [facility] clinic to follow up in regards to possible need for bridging.  Pt is scheduled to have the joint replaced in her big toe on 03/03/17.  Pt verbalized understanding and was appreciative for call.

## 2017-02-24 DIAGNOSIS — M79672 Pain in left foot: Secondary | ICD-10-CM | POA: Diagnosis not present

## 2017-02-24 DIAGNOSIS — M2022 Hallux rigidus, left foot: Secondary | ICD-10-CM | POA: Diagnosis not present

## 2017-02-26 DIAGNOSIS — I2510 Atherosclerotic heart disease of native coronary artery without angina pectoris: Secondary | ICD-10-CM | POA: Diagnosis not present

## 2017-02-26 DIAGNOSIS — I480 Paroxysmal atrial fibrillation: Secondary | ICD-10-CM | POA: Diagnosis not present

## 2017-02-26 DIAGNOSIS — Z0000 Encounter for general adult medical examination without abnormal findings: Secondary | ICD-10-CM | POA: Diagnosis not present

## 2017-02-26 DIAGNOSIS — I340 Nonrheumatic mitral (valve) insufficiency: Secondary | ICD-10-CM | POA: Diagnosis not present

## 2017-02-26 DIAGNOSIS — I252 Old myocardial infarction: Secondary | ICD-10-CM | POA: Diagnosis not present

## 2017-02-26 DIAGNOSIS — Z86010 Personal history of colonic polyps: Secondary | ICD-10-CM | POA: Diagnosis not present

## 2017-02-26 DIAGNOSIS — Z853 Personal history of malignant neoplasm of breast: Secondary | ICD-10-CM | POA: Diagnosis not present

## 2017-02-26 DIAGNOSIS — E039 Hypothyroidism, unspecified: Secondary | ICD-10-CM | POA: Diagnosis not present

## 2017-02-26 DIAGNOSIS — E7800 Pure hypercholesterolemia, unspecified: Secondary | ICD-10-CM | POA: Diagnosis not present

## 2017-02-26 DIAGNOSIS — Z8673 Personal history of transient ischemic attack (TIA), and cerebral infarction without residual deficits: Secondary | ICD-10-CM | POA: Diagnosis not present

## 2017-02-27 ENCOUNTER — Encounter (HOSPITAL_BASED_OUTPATIENT_CLINIC_OR_DEPARTMENT_OTHER): Payer: Self-pay | Admitting: *Deleted

## 2017-02-27 NOTE — Progress Notes (Addendum)
Left message for Dr. Fritzi Mandes re atrial fib, in regards to holding eliquis and/or using lovenox bridge.

## 2017-02-27 NOTE — Progress Notes (Signed)
Pt instructed npo pmn 9/23 x toprol,synthroid w sip of water.  To [facility] 9/24 @ 0730.  Pt needs istat on arrival.  Pt states she was instructed by Dr. Thompson Caul office to hold eliquis Sunday and am of surgery.  She is waiting for order when to resume eliquis p surgey.

## 2017-02-28 NOTE — Progress Notes (Addendum)
No pre-op orders have been entered after calls have been made to office or scheduler, Caryl Pina, and dr Cornelius Moras via phone. Also, no H&P has been faxed.  Called and spoke w/ ashley via phone , stated that H&P was faxed but this Wednesday but she will fax it again since we did not receive it.  Dr Cornelius Moras stated that she was putting orders in epic last night at [facility] but let her they are not.  Caryl Pina stated that dr Cornelius Moras is in [facility] doing surgery today and will back in office this afternoon.  Told ashley that orders need to entered today.   ADDENDUM:  RECEIVED CALL BACK FROM ASHELY DR Cornelius Moras WILL NOT BE BACK IN TIME TO ENTER ORDERS TODAY BUT STATED PER DR Cornelius Moras WILL DO Saturday AM.  ADDENDUM:  RECEIVED H&P VIA FAX AND PLACE ON CHART.

## 2017-03-02 ENCOUNTER — Encounter (HOSPITAL_BASED_OUTPATIENT_CLINIC_OR_DEPARTMENT_OTHER): Payer: Self-pay | Admitting: Podiatry

## 2017-03-03 ENCOUNTER — Ambulatory Visit (HOSPITAL_BASED_OUTPATIENT_CLINIC_OR_DEPARTMENT_OTHER): Payer: Medicare Other | Admitting: Anesthesiology

## 2017-03-03 ENCOUNTER — Ambulatory Visit (HOSPITAL_BASED_OUTPATIENT_CLINIC_OR_DEPARTMENT_OTHER)
Admission: RE | Admit: 2017-03-03 | Discharge: 2017-03-03 | Disposition: A | Discharge status: Home / Self Care | Payer: Medicare Other | Source: Ambulatory Visit | Attending: Podiatry | Admitting: Podiatry

## 2017-03-03 ENCOUNTER — Encounter (HOSPITAL_BASED_OUTPATIENT_CLINIC_OR_DEPARTMENT_OTHER): Payer: Self-pay | Admitting: Anesthesiology

## 2017-03-03 ENCOUNTER — Encounter (HOSPITAL_BASED_OUTPATIENT_CLINIC_OR_DEPARTMENT_OTHER)
Admission: RE | Disposition: A | Discharge status: Home / Self Care | Payer: Self-pay | Source: Ambulatory Visit | Attending: Podiatry

## 2017-03-03 DIAGNOSIS — Z79899 Other long term (current) drug therapy: Secondary | ICD-10-CM | POA: Diagnosis not present

## 2017-03-03 DIAGNOSIS — I4891 Unspecified atrial fibrillation: Secondary | ICD-10-CM | POA: Diagnosis not present

## 2017-03-03 DIAGNOSIS — I2510 Atherosclerotic heart disease of native coronary artery without angina pectoris: Secondary | ICD-10-CM | POA: Insufficient documentation

## 2017-03-03 DIAGNOSIS — I10 Essential (primary) hypertension: Secondary | ICD-10-CM | POA: Diagnosis not present

## 2017-03-03 DIAGNOSIS — M2022 Hallux rigidus, left foot: Secondary | ICD-10-CM | POA: Insufficient documentation

## 2017-03-03 DIAGNOSIS — Z7901 Long term (current) use of anticoagulants: Secondary | ICD-10-CM | POA: Diagnosis not present

## 2017-03-03 DIAGNOSIS — I252 Old myocardial infarction: Secondary | ICD-10-CM | POA: Diagnosis not present

## 2017-03-03 DIAGNOSIS — Z8673 Personal history of transient ischemic attack (TIA), and cerebral infarction without residual deficits: Secondary | ICD-10-CM | POA: Diagnosis not present

## 2017-03-03 DIAGNOSIS — E785 Hyperlipidemia, unspecified: Secondary | ICD-10-CM | POA: Diagnosis not present

## 2017-03-03 HISTORY — DX: Unspecified osteoarthritis, unspecified site: M19.90

## 2017-03-03 HISTORY — DX: Unspecified hearing loss, unspecified ear: H91.90

## 2017-03-03 HISTORY — PX: HALLUX FUSION: SHX6621

## 2017-03-03 LAB — POCT I-STAT, CHEM 8
BUN: 10 mg/dL (ref 6–20)
Calcium, Ion: 1.2 mmol/L (ref 1.15–1.40)
Chloride: 104 mmol/L (ref 101–111)
Creatinine, Ser: 0.7 mg/dL (ref 0.44–1.00)
Glucose, Bld: 94 mg/dL (ref 65–99)
HCT: 46 % (ref 36.0–46.0)
Hemoglobin: 15.6 g/dL — ABNORMAL HIGH (ref 12.0–15.0)
Potassium: 4 mmol/L (ref 3.5–5.1)
Sodium: 144 mmol/L (ref 135–145)
TCO2: 25 mmol/L (ref 22–32)

## 2017-03-03 SURGERY — FUSION, JOINT, GREAT TOE
Anesthesia: Monitor Anesthesia Care | Site: Foot | Laterality: Left

## 2017-03-03 MED ORDER — CEFAZOLIN SODIUM-DEXTROSE 2-4 GM/100ML-% IV SOLN
INTRAVENOUS | Status: AC
  Filled 2017-03-03: qty 100

## 2017-03-03 MED ORDER — BUPIVACAINE HCL (PF) 0.5 % IJ SOLN
INTRAMUSCULAR | Status: DC | PRN
  Administered 2017-03-03: 5 mL

## 2017-03-03 MED ORDER — LIDOCAINE HCL 2 % IJ SOLN
INTRAMUSCULAR | Status: DC | PRN
  Administered 2017-03-03: 5 mL

## 2017-03-03 MED ORDER — CHLORHEXIDINE GLUCONATE CLOTH 2 % EX PADS
6.0000 | MEDICATED_PAD | Freq: Once | CUTANEOUS | Status: DC
  Filled 2017-03-03: qty 6

## 2017-03-03 MED ORDER — LACTATED RINGERS IV SOLN
INTRAVENOUS | Status: DC
  Administered 2017-03-03 (×2): via INTRAVENOUS
  Filled 2017-03-03: qty 1000

## 2017-03-03 MED ORDER — DEXAMETHASONE SODIUM PHOSPHATE 10 MG/ML IJ SOLN
INTRAMUSCULAR | Status: DC | PRN
Start: 2017-03-03 — End: 2017-03-03
  Administered 2017-03-03: 10 mg

## 2017-03-03 MED ORDER — MIDAZOLAM HCL 2 MG/2ML IJ SOLN
INTRAMUSCULAR | Status: AC
  Filled 2017-03-03: qty 2

## 2017-03-03 MED ORDER — ONDANSETRON HCL 4 MG/2ML IJ SOLN
INTRAMUSCULAR | Status: DC | PRN
  Administered 2017-03-03: 4 mg via INTRAVENOUS

## 2017-03-03 MED ORDER — LIDOCAINE 2% (20 MG/ML) 5 ML SYRINGE
INTRAMUSCULAR | Status: AC
  Filled 2017-03-03: qty 5

## 2017-03-03 MED ORDER — ONDANSETRON HCL 4 MG/2ML IJ SOLN
INTRAMUSCULAR | Status: AC
  Filled 2017-03-03: qty 2

## 2017-03-03 MED ORDER — PROMETHAZINE HCL 25 MG/ML IJ SOLN
6.2500 mg | INTRAMUSCULAR | Status: DC | PRN
  Filled 2017-03-03: qty 1

## 2017-03-03 MED ORDER — LIDOCAINE 2% (20 MG/ML) 5 ML SYRINGE
INTRAMUSCULAR | Status: DC | PRN
  Administered 2017-03-03: 80 mg via INTRAVENOUS

## 2017-03-03 MED ORDER — EPHEDRINE 5 MG/ML INJ
INTRAVENOUS | Status: AC
  Filled 2017-03-03: qty 10

## 2017-03-03 MED ORDER — EPHEDRINE SULFATE 50 MG/ML IJ SOLN
INTRAMUSCULAR | Status: DC | PRN
  Administered 2017-03-03: 10 mg via INTRAVENOUS
  Administered 2017-03-03: 15 mg via INTRAVENOUS

## 2017-03-03 MED ORDER — SODIUM CHLORIDE 0.9 % IV SOLN
INTRAVENOUS | Status: DC | PRN
  Administered 2017-03-03: 500 mL

## 2017-03-03 MED ORDER — OXYCODONE HCL 5 MG/5ML PO SOLN
5.0000 mg | Freq: Once | ORAL | Status: DC | PRN
  Filled 2017-03-03: qty 5

## 2017-03-03 MED ORDER — KETOROLAC TROMETHAMINE 30 MG/ML IJ SOLN
INTRAMUSCULAR | Status: AC
  Filled 2017-03-03: qty 1

## 2017-03-03 MED ORDER — PROPOFOL 10 MG/ML IV BOLUS
INTRAVENOUS | Status: DC | PRN
  Administered 2017-03-03: 140 mg via INTRAVENOUS

## 2017-03-03 MED ORDER — HYDROMORPHONE HCL 1 MG/ML IJ SOLN
0.2500 mg | INTRAMUSCULAR | Status: DC | PRN
  Filled 2017-03-03: qty 0.5

## 2017-03-03 MED ORDER — OXYCODONE HCL 5 MG PO TABS
5.0000 mg | ORAL_TABLET | Freq: Once | ORAL | Status: DC | PRN
  Filled 2017-03-03: qty 1

## 2017-03-03 MED ORDER — DEXAMETHASONE SODIUM PHOSPHATE 10 MG/ML IJ SOLN
INTRAMUSCULAR | Status: DC | PRN
  Administered 2017-03-03: 10 mg via INTRAVENOUS

## 2017-03-03 MED ORDER — DEXAMETHASONE SODIUM PHOSPHATE 10 MG/ML IJ SOLN
INTRAMUSCULAR | Status: AC
  Filled 2017-03-03: qty 1

## 2017-03-03 MED ORDER — MIDAZOLAM HCL 2 MG/2ML IJ SOLN
INTRAMUSCULAR | Status: DC | PRN
  Administered 2017-03-03: 2 mg via INTRAVENOUS

## 2017-03-03 MED ORDER — CEFAZOLIN SODIUM-DEXTROSE 2-4 GM/100ML-% IV SOLN
2.0000 g | INTRAVENOUS | Status: AC
  Administered 2017-03-03: 2 g via INTRAVENOUS
  Filled 2017-03-03: qty 100

## 2017-03-03 MED ORDER — FENTANYL CITRATE (PF) 100 MCG/2ML IJ SOLN
INTRAMUSCULAR | Status: AC
  Filled 2017-03-03: qty 2

## 2017-03-03 MED ORDER — PROPOFOL 500 MG/50ML IV EMUL
INTRAVENOUS | Status: AC
  Filled 2017-03-03: qty 50

## 2017-03-03 SURGICAL SUPPLY — 59 items
APL SKNCLS STERI-STRIP NONHPOA (GAUZE/BANDAGES/DRESSINGS) ×1
BANDAGE COBAN STERILE 2 (GAUZE/BANDAGES/DRESSINGS) ×1 IMPLANT
BENZOIN TINCTURE PRP APPL 2/3 (GAUZE/BANDAGES/DRESSINGS) ×1 IMPLANT
BLADE OSCILLATING /SAGITTAL (BLADE) ×1 IMPLANT
BLADE SURG 15 STRL LF DISP TIS (BLADE) ×4 IMPLANT
BLADE SURG 15 STRL SS (BLADE) ×8
BNDG CMPR 9X4 STRL LF SNTH (GAUZE/BANDAGES/DRESSINGS) ×1
BNDG COHESIVE 3X5 TAN STRL LF (GAUZE/BANDAGES/DRESSINGS) ×1 IMPLANT
BNDG CONFORM 2 STRL LF (GAUZE/BANDAGES/DRESSINGS) ×2 IMPLANT
BNDG ESMARK 4X9 LF (GAUZE/BANDAGES/DRESSINGS) ×2 IMPLANT
BUR EGG/OVAL CARBIDE (BURR) ×1 IMPLANT
COVER BACK TABLE 60X90IN (DRAPES) ×2 IMPLANT
COVER MAYO STAND STRL (DRAPES) ×2 IMPLANT
CUFF TOURN SGL QUICK 18 (TOURNIQUET CUFF) ×1 IMPLANT
DECANTER SPIKE VIAL GLASS SM (MISCELLANEOUS) ×1 IMPLANT
DRAPE EXTREMITY T 121X128X90 (DRAPE) ×2 IMPLANT
DRAPE OEC MINIVIEW 54X84 (DRAPES) ×1 IMPLANT
DRAPE SURG 17X23 STRL (DRAPES) ×1 IMPLANT
DRSG EMULSION OIL 3X3 NADH (GAUZE/BANDAGES/DRESSINGS) ×1 IMPLANT
DURAPREP 26ML APPLICATOR (WOUND CARE) ×2 IMPLANT
ELECT REM PT RETURN 9FT ADLT (ELECTROSURGICAL) ×2
ELECTRODE REM PT RTRN 9FT ADLT (ELECTROSURGICAL) ×1 IMPLANT
GAUZE SPONGE 4X4 12PLY STRL LF (GAUZE/BANDAGES/DRESSINGS) IMPLANT
GAUZE SPONGE 4X4 16PLY XRAY LF (GAUZE/BANDAGES/DRESSINGS) ×2 IMPLANT
GAUZE XEROFORM 1X8 LF (GAUZE/BANDAGES/DRESSINGS) ×1 IMPLANT
GLOVE BIO SURGEON STRL SZ7.5 (GLOVE) ×2 IMPLANT
GLOVE BIOGEL PI IND STRL 7.5 (GLOVE) ×1 IMPLANT
GLOVE BIOGEL PI INDICATOR 7.5 (GLOVE) ×1
GOWN STRL REUS W/ TWL XL LVL3 (GOWN DISPOSABLE) ×1 IMPLANT
GOWN STRL REUS W/TWL XL LVL3 (GOWN DISPOSABLE) ×2
IMPL TOE MP JNT MED/LG 21.5 (Toe) IMPLANT
KIT RM TURNOVER CYSTO AR (KITS) ×2 IMPLANT
KWIRE 4.0 X .045IN (WIRE) ×1 IMPLANT
MICRO SAGITTAL BLADE ×1 IMPLANT
NDL SAFETY ECLIPSE 18X1.5 (NEEDLE) ×1 IMPLANT
NEEDLE 27GAX1X1/2 (NEEDLE) ×4 IMPLANT
NEEDLE HYPO 18GX1.5 SHARP (NEEDLE) ×2
OVAL CARBIDE BUR 4.0MM ×1 IMPLANT
PACK BASIN DAY SURGERY FS (CUSTOM PROCEDURE TRAY) ×2 IMPLANT
PAD CAST 4YDX4 CTTN HI CHSV (CAST SUPPLIES) IMPLANT
PADDING CAST COTTON 4X4 STRL (CAST SUPPLIES) ×2
PENCIL BUTTON HOLSTER BLD 10FT (ELECTRODE) ×1 IMPLANT
STOCKINETTE 6  STRL (DRAPES) ×1
STOCKINETTE 6 STRL (DRAPES) ×1 IMPLANT
STRIP CLOSURE SKIN 1/2X4 (GAUZE/BANDAGES/DRESSINGS) ×1 IMPLANT
SUT ETHILON 4 0 PS 2 18 (SUTURE) IMPLANT
SUT ETHILON 5 0 PS 2 18 (SUTURE) IMPLANT
SUT MNCRL AB 4-0 PS2 18 (SUTURE) ×2 IMPLANT
SUT VIC AB 3-0 FS2 27 (SUTURE) IMPLANT
SUT VIC AB 3-0 SH 27 (SUTURE) ×2
SUT VIC AB 3-0 SH 27X BRD (SUTURE) IMPLANT
SUT VIC AB 5-0 PS2 18 (SUTURE) ×1 IMPLANT
SUT VICRYL 4-0 PS2 18IN ABS (SUTURE) ×2 IMPLANT
SYR 3ML 18GX1 1/2 (SYRINGE) ×2 IMPLANT
SYR BULB 3OZ (MISCELLANEOUS) ×2 IMPLANT
SYR CONTROL 10ML LL (SYRINGE) ×4 IMPLANT
TOE MP JOINT MED/LRG 21.5 (Toe) ×2 IMPLANT
TUBE CONNECTING 12X1/4 (SUCTIONS) ×1 IMPLANT
UNDERPAD 30X30 INCONTINENT (UNDERPADS AND DIAPERS) ×2 IMPLANT

## 2017-03-03 NOTE — Anesthesia Procedure Notes (Signed)
Procedure Name: LMA Insertion Date/Time: 03/03/2017 9:11 AM Performed by: Wanita Chamberlain Pre-anesthesia Checklist: Patient identified, Emergency Drugs available, Suction available, Patient being monitored and Timeout performed Patient Re-evaluated:Patient Re-evaluated prior to induction Oxygen Delivery Method: Circle system utilized Preoxygenation: Pre-oxygenation with 100% oxygen Induction Type: IV induction Ventilation: Mask ventilation without difficulty LMA: LMA inserted LMA Size: 3.0 Number of attempts: 2 Placement Confirmation: breath sounds checked- equal and bilateral and positive ETCO2 Tube secured with: Tape Dental Injury: Teeth and Oropharynx as per pre-operative assessment  Comments: Small mouth, 2 FB to TM

## 2017-03-03 NOTE — Anesthesia Postprocedure Evaluation (Signed)
Anesthesia Post Note  Patient: Rhonda Paul  Procedure(s) Performed: Procedure(s) (LRB): HALLUX RIGIDUS CORRECTION WITH COLECTOMY DEBRIDEMENT AND CAPSULAR RELEASE OF THE FIRST MPJ WITH IMPLANT LEFT FOOT (Left)     Patient location during evaluation: PACU Anesthesia Type: General Level of consciousness: awake and alert Pain management: pain level controlled Vital Signs Assessment: post-procedure vital signs reviewed and stable Respiratory status: spontaneous breathing, nonlabored ventilation and respiratory function stable Cardiovascular status: blood pressure returned to baseline and stable Postop Assessment: no apparent nausea or vomiting Anesthetic complications: no    Last Vitals:  Vitals:   03/03/17 1130 03/03/17 1229  BP: 125/69 125/73  Pulse: 70 73  Resp: (!) 21 20  Temp:    SpO2: 99% 100%    Last Pain:  Vitals:   03/03/17 1130  TempSrc:   PainSc: 0-No pain                 Lynda Rainwater

## 2017-03-03 NOTE — Anesthesia Preprocedure Evaluation (Addendum)
Anesthesia Evaluation  Patient identified by MRN, date of birth, ID band Patient awake    Reviewed: Allergy & Precautions, NPO status , Patient's Chart, lab work & pertinent test results, reviewed documented beta blocker date and time   Airway Mallampati: III  TM Distance: <3 FB Neck ROM: Full   Comment: Small mouth Dental no notable dental hx. (+) Teeth Intact, Dental Advisory Given   Pulmonary    Pulmonary exam normal breath sounds clear to auscultation       Cardiovascular hypertension, Pt. on medications and Pt. on home beta blockers + CAD and + Past MI  Normal cardiovascular exam+ dysrhythmias Atrial Fibrillation + Valvular Problems/Murmurs  Rhythm:Regular Rate:Normal     Neuro/Psych  Headaches, CVA, No Residual Symptoms    GI/Hepatic negative GI ROS, Neg liver ROS,   Endo/Other  negative endocrine ROS  Renal/GU negative Renal ROS     Musculoskeletal  (+) Arthritis , Osteoarthritis,    Abdominal   Peds  Hematology   Anesthesia Other Findings   Reproductive/Obstetrics                          Anesthesia Physical  Anesthesia Plan  ASA: III and emergent  Anesthesia Plan: General   Post-op Pain Management:    Induction: Intravenous  PONV Risk Score and Plan: 2 and Ondansetron and Midazolam  Airway Management Planned: LMA  Additional Equipment:   Intra-op Plan:   Post-operative Plan:   Informed Consent: I have reviewed the patients History and Physical, chart, labs and discussed the procedure including the risks, benefits and alternatives for the proposed anesthesia with the patient or authorized representative who has indicated his/her understanding and acceptance.     Plan Discussed with: CRNA and Surgeon  Anesthesia Plan Comments:        Anesthesia Quick Evaluation

## 2017-03-03 NOTE — Discharge Instructions (Signed)
°  Post Anesthesia Home Care Instructions  Activity: Get plenty of rest for the remainder of the day. A responsible individual must stay with you for 24 hours following the procedure.  For the next 24 hours, DO NOT: -Drive a car -Paediatric nurse -Drink alcoholic beverages -Take any medication unless instructed by your physician -Make any legal decisions or sign important papers.  Meals: Start with liquid foods such as gelatin or soup. Progress to regular foods as tolerated. Avoid greasy, spicy, heavy foods. If nausea and/or vomiting occur, drink only clear liquids until the nausea and/or vomiting subsides. Call your physician if vomiting continues.  Special Instructions/Symptoms: Your throat may feel dry or sore from the anesthesia or the breathing tube placed in your throat during surgery. If this causes discomfort, gargle with warm salt water. The discomfort should disappear within 24 hours.  Call your surgeon if you experience:   1.  Fever over 101.0. 2.  Inability to urinate. 3.  Nausea and/or vomiting. 4.  Extreme swelling or bruising at the surgical site. 5.  Continued bleeding from the incision. 6.  Increased pain, redness or drainage from the incision. 7.  Problems related to your pain medication. 8.  Any problems and/or concerns 9. Cool or blue discoloration of toes on surgical foot. 10. Pain not relieved by prescription medication. 11. Follow instructions given at the office.

## 2017-03-03 NOTE — Transfer of Care (Signed)
Immediate Anesthesia Transfer of Care Note  Patient: Rhonda Paul  Procedure(s) Performed: Procedure(s): HALLUX RIGIDUS CORRECTION WITH COLECTOMY DEBRIDEMENT AND CAPSULAR RELEASE OF THE FIRST MPJ WITH IMPLANT LEFT FOOT (Left)  Patient Location: PACU  Anesthesia Type:General  Level of Consciousness: awake, alert , oriented and patient cooperative  Airway & Oxygen Therapy: Patient Spontanous Breathing and Patient connected to nasal cannula oxygen  Post-op Assessment: Report given to RN and Post -op Vital signs reviewed and stable  Post vital signs: Reviewed and stable  Last Vitals:  Vitals:   03/03/17 0746  BP: 124/71  Pulse: 69  Resp: 17  Temp: 36.9 C  SpO2: 100%    Last Pain:  Vitals:   03/03/17 0746  TempSrc: Oral  PainSc:          Complications: No apparent anesthesia complications

## 2017-03-04 NOTE — Op Note (Signed)
NAME:  Rhonda Paul, Rhonda Paul                   ACCOUNT NO.:  MEDICAL RECORD NO.:  [id number]  LOCATION:                                 FACILITY:  PHYSICIAN:  Twanna Hy. Eaden Hettinger, D.P.M.DATE OF BIRTH:  01/02/1941  DATE OF PROCEDURE:  03/03/2017 DATE OF DISCHARGE:                              OPERATIVE REPORT   SURGEON:  Twanna Hy. Kj Imbert, D.P.M.  ASSISTANT:  None.  PREOPERATIVE DIAGNOSIS:  Left hallux rigidus.  POSTOPERATIVE DIAGNOSIS:  Left hallux rigidus.  PROCEDURES:  First metatarsophalangeal joint replacement procedure with hemi-implants.  ANESTHESIA:  General.  COMPLICATIONS:  None.  DESCRIPTION OF PROCEDURE:  The patient was brought to the ER and placed in supine position, at which time, general anesthesia was administered. The patient was prepped and draped in the usual aseptic manner and the previously applied tourniquet was inflated to 250 mmHg.  A dorsal linear incision was made over the first metatarsophalangeal joint.  An incision was made medial to the extensor tendon apparatus.  The incision was deepened via sharp bone modalities, taking care to clamp and cauterize all bleeding vessels and ensuring retraction of neurovascular structures encountered.  Dissection continued to the level of the first metatarsophalangeal joint capsule where a linear capsular incision was made.  Incision was deepened and the capsule and periosteum freed from the head of the first metatarsal and proximal half of the proximal phalanx.  Once exposed, there were 3 bone fragments at the area of the head of the first metatarsophalangeal joint, which were all identified and excised in total.  A small segment of dorsal bone overgrowth over the base of the proximal phalanx was resected with a rongeur to identify the zone of the articular cartilage.  Capsule was freed from the head of the first metatarsal where there was found to be moderate hypertrophic bone at the dorsal aspect of the head of  the first metatarsal and approximately 80% at the head of the first metatarsal had erosion.  With a saw blade parallel to the shaft, the medial eminence was resected. All the hypertrophic bone of the head of the first metatarsal was also resected.  Approximately 5 mm of the base of the proximal phalanx was resected and this cut was made parallel to the articular surface and perpendicular to the long axis of the proximal phalanx, taking great care not to cut the flexor hallucis brevis on the plantar surface.  This was found to be an adequate cut with no disruption of the plantar tendon.  In addition, a Soil scientist was used to free the sesamoids to improve first metatarsophalangeal joint range of motion, this was found to be adequate.  All rough edges were smoothed with a rotary burr, taking great care to maintain as much dorsal cartilage as possible, but she had great wear of the cartilage.  The area was irrigated with copious amounts of sterile saline and an antibiotic solution.  Using the 5-star template, implant selection was made and it was determined that she was a medium large in size.  The trial punch was used in the base of the proximal phalanx and inserted parallel to the long axis with the proximal  phalanx and positioned perpendicular to the implant template.  Next, the trial implant was used to determine the exact size needed. This again was a medium large.  Proper motion was achieved.  The implant trial was removed and an accommodation for the implant stem was made. The proper implant was then inserted in the aforementioned hole carefully to maintain the positioning of the implant.  This was found to be very good and the Impact handle was used to make sure it was completely seated against the osteotomized phalanx.  Again, a check was made to assure proper and adequate dorsiflexion and plantar flexion. First metatarsophalangeal joint range of motion was excellent.  The  deep capsular tissues were reapproximated using 3-0 and 4-0 Vicryl and skin closure accomplished with 4-0 Monocryl.  Dexamethasone phosphate 1 mL was infiltrated around the surgical site as well as 10 mL of 50:50 mixture of 0.5% Marcaine plain and 1% lidocaine plain.  Steri-Strips, sterile bandage were applied and coban.  The tourniquet was deflated and vascular status returned to all digits.  The patient had bleeding within normal limits and lost less than 1 mL of blood.  Postoperative instructions were reviewed with the patient and capillary refill time was within normal limits as well as vital signs.  No guarantees were given.  All questions answered.          ______________________________ Twanna Hy. Fritzi Mandes, D.P.M.     MJA/MEDQ  D:  03/03/2017  T:  03/04/2017  Job:  [id number]

## 2017-03-05 ENCOUNTER — Encounter (HOSPITAL_BASED_OUTPATIENT_CLINIC_OR_DEPARTMENT_OTHER): Payer: Self-pay | Admitting: Podiatry

## 2017-03-06 ENCOUNTER — Encounter (HOSPITAL_BASED_OUTPATIENT_CLINIC_OR_DEPARTMENT_OTHER): Payer: Self-pay | Admitting: Podiatry

## 2017-03-07 DIAGNOSIS — M2022 Hallux rigidus, left foot: Secondary | ICD-10-CM | POA: Diagnosis not present

## 2017-04-06 ENCOUNTER — Other Ambulatory Visit: Payer: Self-pay | Admitting: Interventional Cardiology

## 2017-04-06 DIAGNOSIS — M818 Other osteoporosis without current pathological fracture: Secondary | ICD-10-CM

## 2017-04-06 DIAGNOSIS — I4821 Permanent atrial fibrillation: Secondary | ICD-10-CM

## 2017-04-06 DIAGNOSIS — E782 Mixed hyperlipidemia: Secondary | ICD-10-CM

## 2017-04-17 DIAGNOSIS — M205X1 Other deformities of toe(s) (acquired), right foot: Secondary | ICD-10-CM | POA: Diagnosis not present

## 2017-07-02 DIAGNOSIS — M2022 Hallux rigidus, left foot: Secondary | ICD-10-CM | POA: Diagnosis not present

## 2017-07-04 ENCOUNTER — Telehealth: Payer: Self-pay | Admitting: Interventional Cardiology

## 2017-07-04 DIAGNOSIS — I2510 Atherosclerotic heart disease of native coronary artery without angina pectoris: Secondary | ICD-10-CM

## 2017-07-04 DIAGNOSIS — E782 Mixed hyperlipidemia: Secondary | ICD-10-CM

## 2017-07-04 NOTE — Telephone Encounter (Signed)
New message  Pt verbalized that she is calling for the RN  She want labs done efore coming int o see Dr.Smith 08-07-2017

## 2017-07-04 NOTE — Telephone Encounter (Signed)
Notes Recorded by Belva Crome, MD on 07/26/2016 at 8:42 AM EST Let the patient know that the laboratory data is at target. No evidence of liver impairment. The LDL was 72. Repeat data in one year. A copy will be sent to Tawanna Solo, MD   Pt calling in to schedule her repeat yearly labs, as advised by Dr Tamala Julian, prior to her appt with him on 08/07/17.  Orders place for the pt to have repeat labs a few days prior too her 2/28 OV, on 08/04/17.  Orders placed to obtain a cmet, cbc w diff, and lipids.  CMET and CBC W DIFF added for pt is on Eliquis.  Advised the pt to come fasting to this lab appt.  Pt verbalized understanding and agrees with this plan.  Will forward this message to Dr Thompson Caul covering Nurse, as a general FYI.

## 2017-07-31 ENCOUNTER — Encounter: Payer: Self-pay | Admitting: Interventional Cardiology

## 2017-08-04 ENCOUNTER — Other Ambulatory Visit: Payer: Medicare Other

## 2017-08-06 ENCOUNTER — Other Ambulatory Visit: Payer: Self-pay | Admitting: Physician Assistant

## 2017-08-06 DIAGNOSIS — E559 Vitamin D deficiency, unspecified: Secondary | ICD-10-CM

## 2017-08-06 DIAGNOSIS — C50912 Malignant neoplasm of unspecified site of left female breast: Secondary | ICD-10-CM

## 2017-08-07 ENCOUNTER — Ambulatory Visit: Payer: Medicare Other | Admitting: Interventional Cardiology

## 2017-08-22 ENCOUNTER — Other Ambulatory Visit: Payer: Medicare Other

## 2017-08-25 ENCOUNTER — Other Ambulatory Visit: Payer: Medicare HMO | Admitting: *Deleted

## 2017-08-25 DIAGNOSIS — I2510 Atherosclerotic heart disease of native coronary artery without angina pectoris: Secondary | ICD-10-CM

## 2017-08-25 DIAGNOSIS — E782 Mixed hyperlipidemia: Secondary | ICD-10-CM | POA: Diagnosis not present

## 2017-08-25 LAB — CBC WITH DIFFERENTIAL/PLATELET
Basophils Absolute: 0 10*3/uL (ref 0.0–0.2)
Basos: 1 %
EOS (ABSOLUTE): 0.1 10*3/uL (ref 0.0–0.4)
Eos: 2 %
Hematocrit: 40.5 % (ref 34.0–46.6)
Hemoglobin: 13.7 g/dL (ref 11.1–15.9)
Immature Grans (Abs): 0 10*3/uL (ref 0.0–0.1)
Immature Granulocytes: 0 %
Lymphocytes Absolute: 2.4 10*3/uL (ref 0.7–3.1)
Lymphs: 43 %
MCH: 33.4 pg — ABNORMAL HIGH (ref 26.6–33.0)
MCHC: 33.8 g/dL (ref 31.5–35.7)
MCV: 99 fL — ABNORMAL HIGH (ref 79–97)
Monocytes Absolute: 0.6 10*3/uL (ref 0.1–0.9)
Monocytes: 11 %
Neutrophils Absolute: 2.4 10*3/uL (ref 1.4–7.0)
Neutrophils: 43 %
Platelets: 343 10*3/uL (ref 150–379)
RBC: 4.1 x10E6/uL (ref 3.77–5.28)
RDW: 13.9 % (ref 12.3–15.4)
WBC: 5.5 10*3/uL (ref 3.4–10.8)

## 2017-08-25 LAB — COMPREHENSIVE METABOLIC PANEL
ALT: 24 IU/L (ref 0–32)
AST: 24 IU/L (ref 0–40)
Albumin/Globulin Ratio: 1.6 (ref 1.2–2.2)
Albumin: 4.4 g/dL (ref 3.6–4.8)
Alkaline Phosphatase: 50 IU/L (ref 39–117)
BUN/Creatinine Ratio: 12 (ref 12–28)
BUN: 10 mg/dL (ref 8–27)
Bilirubin Total: 0.5 mg/dL (ref 0.0–1.2)
CO2: 24 mmol/L (ref 20–29)
Calcium: 9.4 mg/dL (ref 8.7–10.3)
Chloride: 103 mmol/L (ref 96–106)
Creatinine, Ser: 0.85 mg/dL (ref 0.57–1.00)
GFR calc Af Amer: 83 mL/min/{1.73_m2} (ref >59)
GFR calc non Af Amer: 72 mL/min/{1.73_m2} (ref >59)
Globulin, Total: 2.7 g/dL (ref 1.5–4.5)
Glucose: 89 mg/dL (ref 65–99)
Potassium: 4.7 mmol/L (ref 3.5–5.2)
Sodium: 142 mmol/L (ref 134–144)
Total Protein: 7.1 g/dL (ref 6.0–8.5)

## 2017-08-25 LAB — LIPID PANEL
Chol/HDL Ratio: 2.5 ratio (ref 0.0–4.4)
Cholesterol, Total: 180 mg/dL (ref 100–199)
HDL: 72 mg/dL (ref >39)
LDL Calculated: 87 mg/dL (ref 0–99)
Triglycerides: 106 mg/dL (ref 0–149)
VLDL Cholesterol Cal: 21 mg/dL (ref 5–40)

## 2017-08-26 NOTE — Progress Notes (Signed)
Cardiology Office Note    Date:  08/27/2017   ID:  Rhonda Paul, DOB 1951/06/04, MRN [id number]  PCP:  Kathyrn Lass, MD  Cardiologist: Sinclair Grooms, MD   Chief Complaint  Patient presents with  . Atrial Fibrillation    History of Present Illness:  Rhonda Paul is a 67 y.o. female who presents for CAD, embolic CVA 6967, permanent atrial fibrillation, non-ST elevation myocardial infarction due to plaque rupture, an chronic anticoagulation therapy.  Rhonda Paul is doing well.  Her 82 year older sister now has atrial fibrillation and underwent electrical cardioversion and medical therapy to maintain sinus rhythm.  She had syncope after medications were started.  She wonders why we did not try to convert her heart to normal rhythm.  We discussed the treatment possibilities for atrial fibrillation and in her case rate was controlled and she had no symptoms.  We therefore used anticoagulation therapy to prevent stroke and she has done well since that time.  She was initially treated with aspirin after discovering atrial fib because of a Chads VASC less than 2.  After the CVA in 2016 anticoagulation was started.   Past Medical History:  Diagnosis Date  . Adenocarcinoma of breast ([place]) 06/03   RIGHT  . Amenorrhea 09/01  . Amenorrhea   . Arthritis   . Chemotherapy adverse reaction   . Chronic anticoagulation    Eliquis  . Diverticulitis 2009   diverticulitis  . Heart attack ([place])   . HOH (hard of hearing)    right ear; wears hearing aid  . Hypertension   . Infertility, female 09/01   4 SAB  . Metrorrhagia   . Migraine 09/01  . Permanent atrial fibrillation ([place]) 04/01/2014  . Personal history of chemotherapy 2003  . Personal history of radiation therapy 2003  . STD (sexually transmitted disease) 1976   Hx of HSV  . Stroke ([place]) 01/03/15   s/p TPA R-MCA  . Vitamin D deficiency 07/2006    Past Surgical History:  Procedure Laterality Date  . APPENDECTOMY  1959  .  BREAST SURGERY Right 11/2001   lumpectomy with sentinel node biopsy X 3 all negative, ER/PR negative , chemo 6 doses, and radiation daily X 6 weeks  . CARDIAC CATHETERIZATION N/A 01/16/2015   Procedure: Left Heart Cath and Coronary Angiography;  Surgeon: Belva Crome, MD; OM1 75%, RCA 30%, EF normal; spasm in RCA w/ catheter engagement   . COLONOSCOPY  11/2012   diverticulitis, 2 polyps negative, repeat in 5 years  . HALLUX FUSION Left 03/03/2017   Procedure: HALLUX RIGIDUS CORRECTION WITH COLECTOMY DEBRIDEMENT AND CAPSULAR RELEASE OF THE FIRST MPJ WITH IMPLANT LEFT FOOT;  Surgeon: Rosemary Holms, DPM;  Location: [facility];  Service: Podiatry;  Laterality: Left;  . PORT-A-CATH REMOVAL    . RADIOLOGY WITH ANESTHESIA N/A 01/03/2015   Procedure: RADIOLOGY WITH ANESTHESIA;  Surgeon: Medication Radiologist, MD;  Location: [facility];  Service: Radiology;  Laterality: N/A;    Current Medications: Outpatient Medications Prior to Visit  Medication Sig Dispense Refill  . acetaminophen (TYLENOL) 500 MG tablet Take 1,000 mg by mouth every 6 (six) hours as needed for moderate pain or headache. Reported on 09/05/2015    . ELIQUIS 5 MG TABS tablet TAKE 1 TABLET BY MOUTH TWO TIMES A DAY 60 tablet 4  . Ergocalciferol (VITAMIN D2) 2000 units TABS Take 2,000 Units by mouth daily.    Marland Kitchen levothyroxine (SYNTHROID, LEVOTHROID) 50 MCG tablet Take 1 tablet (  50 mcg total) by mouth daily before breakfast. 30 tablet 12  . metoprolol succinate (TOPROL-XL) 50 MG 24 hr tablet Take 1 tablet (50 mg total) by mouth daily. Please keep upcoming appt with Dr. Tamala Julian. Thank you 90 tablet 0  . nitroGLYCERIN (NITROSTAT) 0.4 MG SL tablet Place 1 tablet (0.4 mg total) under the tongue every 5 (five) minutes as needed for chest pain. 25 tablet 3  . rosuvastatin (CRESTOR) 20 MG tablet Take 20 mg by mouth daily.    . rosuvastatin (CRESTOR) 20 MG tablet Take 1 tablet (20 mg total) by mouth daily. 90 tablet 3   No  facility-administered medications prior to visit.      Allergies:   Lambs quarters; Lanolin acid [lanolin]; and Other   Social History   Socioeconomic History  . Marital status: Married    Spouse name: None  . Number of children: None  . Years of education: None  . Highest education level: None  Social Needs  . Financial resource strain: None  . Food insecurity - worry: None  . Food insecurity - inability: None  . Transportation needs - medical: None  . Transportation needs - non-medical: None  Occupational History  . None  Tobacco Use  . Smoking status: Never Smoker  . Smokeless tobacco: Never Used  . Tobacco comment: never used tobacco  Substance and Sexual Activity  . Alcohol use: Yes    Alcohol/week: 0.0 oz    Types: 7 - 14 Glasses of wine per week  . Drug use: No  . Sexual activity: Yes    Partners: Male    Birth control/protection: Post-menopausal  Other Topics Concern  . None  Social History Narrative  . None     Family History:  The patient's family history includes Breast cancer in her maternal aunt; Cancer in her maternal aunt and mother; Cervical cancer in her mother; Colon cancer in her mother; Heart disease in her brother and father; Multiple births in her maternal grandmother and paternal grandmother; Osteoporosis in her mother.   ROS:   Please see the history of present illness.    Left great toe surgery earlier this year.  No blood in the urine or stool.  Denies syncope. All other systems reviewed and are negative.   PHYSICAL EXAM:   VS:  BP 110/74   Pulse 71   Ht 5\' 5"  (1.651 m)   Wt 149 lb 1.9 oz (67.6 kg)   LMP 11/08/2001 (Approximate)   BMI 24.81 kg/m    GEN: Well nourished, well developed, in no acute distress  HEENT: normal  Neck: no JVD, carotid bruits, or masses Cardiac: IIRR; no murmurs, rubs, or gallops,no edema  Respiratory:  clear to auscultation bilaterally, normal work of breathing GI: soft, nontender, nondistended, + BS MS:  no deformity or atrophy  Skin: warm and dry, no rash Neuro:  Alert and Oriented x 3, Strength and sensation are intact Psych: euthymic mood, full affect  Wt Readings from Last 3 Encounters:  08/27/17 149 lb 1.9 oz (67.6 kg)  03/03/17 148 lb 12.8 oz (67.5 kg)  02/03/17 150 lb (68 kg)      Studies/Labs Reviewed:   EKG:  EKG atrial fibrillation with controlled ventricular response at 71 bpm.  Recent Labs: 02/03/2017: TSH 2.260 08/25/2017: ALT 24; BUN 10; Creatinine, Ser 0.85; Hemoglobin 13.7; Platelets 343; Potassium 4.7; Sodium 142   Lipid Panel    Component Value Date/Time   CHOL 180 08/25/2017 0826   TRIG 106 08/25/2017  0826   HDL 72 08/25/2017 0826   CHOLHDL 2.5 08/25/2017 0826   CHOLHDL 2.2 09/25/2015 0959   VLDL 17 09/25/2015 0959   LDLCALC 87 08/25/2017 0826    Additional studies/ records that were reviewed today include:   2D Doppler echocardiogram July 2016: Study Conclusions   - Left ventricle: The cavity size was normal. Wall thickness was   normal. The estimated ejection fraction was 55%. Wall motion was   normal; there were no regional wall motion abnormalities.   Indeterminant diastolic function (atrial fibrillation). - Aortic valve: There was no stenosis. - Mitral valve: There was mild regurgitation. - Left atrium: The atrium was moderately dilated. - Right ventricle: The cavity size was normal. Systolic function   was normal. - Right atrium: The atrium was moderately dilated. - Tricuspid valve: Peak RV-RA gradient (S): 31 mm Hg. - Pulmonary arteries: PA peak pressure: 46 mm Hg (S). - Systemic veins: IVC measured 2.2 cm with < 50% respirophasic   variation, suggesting RA pressure 15 mmHg. - Pericardium, extracardiac: Small, primarily posterior pericardial   effusion.   Impressions:   - The patient was in atrial fibrillation. Normal LV size with EF   55%. Normal RV size and systolic function. Moderate biatrial   enlargement. Mild MR and TR. Mild  pulmonary hypertension  Cardiac catheterization August 2016:  Moderately severe first obtuse marginal stenosis. The marginal is a relatively small vessel in distribution and diameter. This likely represents the culprit for the patient's presentation.  30% proximal RCA narrowing with more severe narrowing on catheter engagement due to spasm. The spasm was relieved with intracoronary nitroglycerin.  Widely patent LAD  Normal LV function   Recommendations:    Sublingual nitroglycerin for chest discomfort  Consider low-dose long-acting nitrates if recurring episodes of chest discomfort.  Eligible for discharge in a.m. assuming no complications.     ASSESSMENT:    1. Permanent atrial fibrillation ([place])   2. Moderate mitral regurgitation   3. Chronic anticoagulation    4. CAD in native artery   5. Mixed hyperlipidemia      PLAN:  In order of problems listed above:  1. Asymptomatic on metoprolol for rate control.  No signs or symptoms of volume overload/diastolic heart failure.  Last LVEF greater than 55% 2016. 2. No clinically audible mitral regurgitation is noted. 3. Anticoagulation with Eliquis.  No bleeding complications.  Needs twice yearly hemoglobin and creatinine to ensure safe dosing. 4. Coronary anatomy from 2016 as noted above.  No symptoms suggest angina.  Clinical follow-up with aggressive risk factor modification including LDL less than 70, hemoglobin A1c less than 7, blood pressure less than or equal to 130/80 mmHg, and moderate aerobic exercise. 5. Last LDL was near target and was 87 in March 2019.  Continue aggressive secondary prevention as noted above.  Clinical follow-up in 1 year.  Twice yearly hemoglobin and creatinine to ensure appropriate dosing of Eliquis.  Medication Adjustments/Labs and Tests Ordered: Current medicines are reviewed at length with the patient today.  Concerns regarding medicines are outlined above.  Medication changes, Labs and Tests  ordered today are listed in the Patient Instructions below. Patient Instructions  Medication Instructions:  Your physician recommends that you continue on your current medications as directed. Please refer to the Current Medication list given to you today.  Labwork: None  Testing/Procedures: None  Follow-Up: Your physician wants you to follow-up in: 1 year with Dr. Tamala Julian.  You will receive a reminder letter in the mail  two months in advance. If you don't receive a letter, please call our office to schedule the follow-up appointment.   Any Other Special Instructions Will Be Listed Below (If Applicable).     If you need a refill on your cardiac medications before your next appointment, please call your pharmacy.      Signed, Sinclair Grooms, MD  08/27/2017 12:36 PM    [facility] [address] [place], [place], [place]  [address] Phone: [phone]; Fax: [phone]

## 2017-08-27 ENCOUNTER — Encounter: Payer: Self-pay | Admitting: Interventional Cardiology

## 2017-08-27 ENCOUNTER — Ambulatory Visit: Payer: Medicare HMO | Admitting: Interventional Cardiology

## 2017-08-27 VITALS — BP 110/74 | HR 71 | Ht 65.0 in | Wt 149.1 lb

## 2017-08-27 DIAGNOSIS — I4821 Permanent atrial fibrillation: Secondary | ICD-10-CM

## 2017-08-27 DIAGNOSIS — I2510 Atherosclerotic heart disease of native coronary artery without angina pectoris: Secondary | ICD-10-CM | POA: Diagnosis not present

## 2017-08-27 DIAGNOSIS — I482 Chronic atrial fibrillation: Secondary | ICD-10-CM | POA: Diagnosis not present

## 2017-08-27 DIAGNOSIS — E782 Mixed hyperlipidemia: Secondary | ICD-10-CM

## 2017-08-27 DIAGNOSIS — Z7901 Long term (current) use of anticoagulants: Secondary | ICD-10-CM

## 2017-08-27 DIAGNOSIS — I340 Nonrheumatic mitral (valve) insufficiency: Secondary | ICD-10-CM | POA: Diagnosis not present

## 2017-08-27 NOTE — Patient Instructions (Signed)

## 2017-09-01 ENCOUNTER — Other Ambulatory Visit: Payer: Self-pay | Admitting: Interventional Cardiology

## 2017-09-01 DIAGNOSIS — E782 Mixed hyperlipidemia: Secondary | ICD-10-CM

## 2017-09-01 DIAGNOSIS — M818 Other osteoporosis without current pathological fracture: Secondary | ICD-10-CM

## 2017-09-01 DIAGNOSIS — I4821 Permanent atrial fibrillation: Secondary | ICD-10-CM

## 2017-09-01 NOTE — Telephone Encounter (Signed)
Eliquis 5mg  refill request received; pt is 67 yrs old, wt- 67.6kg, Crea-0.85 on 08/25/17, last seen by Dr. Tamala Julian on 08/27/17; will send in refill to requested pharmacy.

## 2017-10-27 ENCOUNTER — Telehealth: Payer: Self-pay | Admitting: *Deleted

## 2017-10-27 ENCOUNTER — Other Ambulatory Visit: Payer: Self-pay | Admitting: *Deleted

## 2017-10-27 DIAGNOSIS — E559 Vitamin D deficiency, unspecified: Secondary | ICD-10-CM

## 2017-10-27 DIAGNOSIS — C50912 Malignant neoplasm of unspecified site of left female breast: Secondary | ICD-10-CM

## 2017-10-27 DIAGNOSIS — R69 Illness, unspecified: Secondary | ICD-10-CM | POA: Diagnosis not present

## 2017-10-27 DIAGNOSIS — E782 Mixed hyperlipidemia: Secondary | ICD-10-CM

## 2017-10-27 DIAGNOSIS — I4821 Permanent atrial fibrillation: Secondary | ICD-10-CM

## 2017-10-27 DIAGNOSIS — M818 Other osteoporosis without current pathological fracture: Secondary | ICD-10-CM

## 2017-10-27 MED ORDER — METOPROLOL SUCCINATE ER 50 MG PO TB24
50.0000 mg | ORAL_TABLET | Freq: Every day | ORAL | 3 refills | Status: DC
Start: 2017-10-27 — End: 2018-10-21

## 2017-10-27 MED ORDER — APIXABAN 5 MG PO TABS: 5.0000 mg | ORAL_TABLET | Freq: Two times a day (BID) | ORAL | 10 refills | Status: DC

## 2017-10-27 NOTE — Telephone Encounter (Signed)
Paper refill request received; pt is 67 yrs old, wt-67.6kg, Crea-0.85 on 08/25/17, last seen by Dr. Tamala Julian on 08/27/17; will send in refill to requested pharmacy.

## 2017-11-05 DIAGNOSIS — R69 Illness, unspecified: Secondary | ICD-10-CM | POA: Diagnosis not present

## 2017-11-06 ENCOUNTER — Telehealth: Payer: Self-pay

## 2017-11-06 DIAGNOSIS — K5730 Diverticulosis of large intestine without perforation or abscess without bleeding: Secondary | ICD-10-CM | POA: Diagnosis not present

## 2017-11-06 DIAGNOSIS — Z86010 Personal history of colonic polyps: Secondary | ICD-10-CM | POA: Diagnosis not present

## 2017-11-06 DIAGNOSIS — Z1211 Encounter for screening for malignant neoplasm of colon: Secondary | ICD-10-CM | POA: Diagnosis not present

## 2017-11-06 DIAGNOSIS — Z800 Family history of malignant neoplasm of digestive organs: Secondary | ICD-10-CM | POA: Diagnosis not present

## 2017-11-06 NOTE — Telephone Encounter (Signed)
   [facility] Pre-operative Risk Assessment    Request for surgical clearance:  1. What type of surgery is being performed? Colonoscopy   2. When is this surgery scheduled? 12/24/17   3. What type of clearance is required (medical clearance vs. Pharmacy clearance to hold med vs. Both)? Both  4. Are there any medications that need to be held prior to surgery and how long? Eliquis   5. Practice name and name of physician performing surgery? [facility].- Dr. Collene Mares   6. What is your office phone number? [phone]    7.   What is your office fax number? [phone]  8.   Anesthesia type (None, local, MAC, general) ? Propofol

## 2017-11-07 NOTE — Telephone Encounter (Signed)
Pt takes Eliquis for afib with CHADS2VASc score of 6 (age, sex, CAD, HTN, embolic CVA in 9147). Renal function is normal. Recommend only holding Eliquis for 24 hours prior to procedure due to elevated cardiac risk including prior stroke, and resume Eliquis ASAP after procedure.

## 2017-11-07 NOTE — Telephone Encounter (Addendum)
   Primary Cardiologist: Sinclair Grooms, MD  Chart reviewed as part of pre-operative protocol coverage. Patient was contacted 11/07/2017 in reference to pre-operative risk assessment for pending surgery as outlined below.  Rhonda Paul was last seen on 08/27/2017 by Dr. Tamala Julian.  Since that day, Rhonda Paul has done well.  She is not having any chest pain or shortness of breath.  She is not having any symptoms from the atrial fibrillation.  Therefore, based on ACC/AHA guidelines, the patient would be at acceptable risk for the planned procedure without further cardiovascular testing.   This is been routed to the pharmacy to address holding the Eliquis.  Once the pharmacy has addressed anticoagulation, route this recommendation to the requesting party via Epic fax function and remove from pre-op pool.  We will also route to the preop call back pool so they can contact the patient regarding holding the Eliquis.  Please call with questions.  Rosaria Ferries, PA-C 11/07/2017, 3:26 PM

## 2017-11-10 NOTE — Telephone Encounter (Addendum)
Spoke with patient and informed her of the pharmacy recommendations. Patient questioned if she needed to double her evening dose of Eliquis the evening after test-per Lurena Joiner patient does not she is to resume daily dosage as prescribed. Patient voiced understanding.   Clearance note sent to Dr Youlanda Mighty office.

## 2017-11-13 DIAGNOSIS — R69 Illness, unspecified: Secondary | ICD-10-CM | POA: Diagnosis not present

## 2017-12-24 ENCOUNTER — Encounter: Payer: Self-pay | Admitting: Obstetrics & Gynecology

## 2017-12-24 DIAGNOSIS — Z800 Family history of malignant neoplasm of digestive organs: Secondary | ICD-10-CM | POA: Diagnosis not present

## 2017-12-24 DIAGNOSIS — Z1211 Encounter for screening for malignant neoplasm of colon: Secondary | ICD-10-CM | POA: Diagnosis not present

## 2017-12-24 DIAGNOSIS — K5730 Diverticulosis of large intestine without perforation or abscess without bleeding: Secondary | ICD-10-CM | POA: Diagnosis not present

## 2017-12-24 DIAGNOSIS — Z86010 Personal history of colonic polyps: Secondary | ICD-10-CM | POA: Diagnosis not present

## 2017-12-26 ENCOUNTER — Other Ambulatory Visit: Payer: Self-pay | Admitting: Obstetrics & Gynecology

## 2017-12-26 DIAGNOSIS — Z1231 Encounter for screening mammogram for malignant neoplasm of breast: Secondary | ICD-10-CM

## 2017-12-29 ENCOUNTER — Ambulatory Visit
Admission: RE | Admit: 2017-12-29 | Discharge: 2017-12-29 | Disposition: A | Discharge status: Home / Self Care | Payer: Medicare HMO | Source: Ambulatory Visit | Attending: Obstetrics & Gynecology | Admitting: Obstetrics & Gynecology

## 2017-12-29 DIAGNOSIS — Z1231 Encounter for screening mammogram for malignant neoplasm of breast: Secondary | ICD-10-CM | POA: Diagnosis not present

## 2018-01-20 DIAGNOSIS — L819 Disorder of pigmentation, unspecified: Secondary | ICD-10-CM | POA: Diagnosis not present

## 2018-01-20 DIAGNOSIS — D485 Neoplasm of uncertain behavior of skin: Secondary | ICD-10-CM | POA: Diagnosis not present

## 2018-01-20 DIAGNOSIS — L814 Other melanin hyperpigmentation: Secondary | ICD-10-CM | POA: Diagnosis not present

## 2018-01-20 DIAGNOSIS — L821 Other seborrheic keratosis: Secondary | ICD-10-CM | POA: Diagnosis not present

## 2018-01-29 ENCOUNTER — Other Ambulatory Visit: Payer: Self-pay

## 2018-01-29 ENCOUNTER — Inpatient Hospital Stay: Payer: Medicare HMO | Attending: Hematology & Oncology

## 2018-01-29 ENCOUNTER — Inpatient Hospital Stay (HOSPITAL_BASED_OUTPATIENT_CLINIC_OR_DEPARTMENT_OTHER): Payer: Medicare HMO | Admitting: Hematology & Oncology

## 2018-01-29 VITALS — BP 111/79 | HR 64 | Temp 98.3°F | Resp 16 | Wt 151.0 lb

## 2018-01-29 DIAGNOSIS — Z923 Personal history of irradiation: Secondary | ICD-10-CM

## 2018-01-29 DIAGNOSIS — M1990 Unspecified osteoarthritis, unspecified site: Secondary | ICD-10-CM | POA: Insufficient documentation

## 2018-01-29 DIAGNOSIS — E782 Mixed hyperlipidemia: Secondary | ICD-10-CM

## 2018-01-29 DIAGNOSIS — Z853 Personal history of malignant neoplasm of breast: Secondary | ICD-10-CM

## 2018-01-29 DIAGNOSIS — M810 Age-related osteoporosis without current pathological fracture: Secondary | ICD-10-CM

## 2018-01-29 DIAGNOSIS — C50011 Malignant neoplasm of nipple and areola, right female breast: Secondary | ICD-10-CM

## 2018-01-29 DIAGNOSIS — I4821 Permanent atrial fibrillation: Secondary | ICD-10-CM

## 2018-01-29 DIAGNOSIS — M818 Other osteoporosis without current pathological fracture: Secondary | ICD-10-CM

## 2018-01-29 LAB — CBC WITH DIFFERENTIAL (CANCER CENTER ONLY)
Basophils Absolute: 0 10*3/uL (ref 0.0–0.1)
Basophils Relative: 1 %
Eosinophils Absolute: 0.1 10*3/uL (ref 0.0–0.5)
Eosinophils Relative: 2 %
HCT: 38.9 % (ref 34.8–46.6)
Hemoglobin: 13.5 g/dL (ref 11.6–15.9)
Lymphocytes Relative: 42 %
Lymphs Abs: 2.1 10*3/uL (ref 0.9–3.3)
MCH: 34.4 pg — ABNORMAL HIGH (ref 26.0–34.0)
MCHC: 34.7 g/dL (ref 32.0–36.0)
MCV: 99.2 fL (ref 81.0–101.0)
Monocytes Absolute: 0.5 10*3/uL (ref 0.1–0.9)
Monocytes Relative: 10 %
Neutro Abs: 2.2 10*3/uL (ref 1.5–6.5)
Neutrophils Relative %: 45 %
Platelet Count: 213 10*3/uL (ref 145–400)
RBC: 3.92 MIL/uL (ref 3.70–5.32)
RDW: 12.3 % (ref 11.1–15.7)
WBC Count: 5 10*3/uL (ref 3.9–10.0)

## 2018-01-29 LAB — CMP (CANCER CENTER ONLY)
ALT: 28 U/L (ref 10–47)
AST: 28 U/L (ref 11–38)
Albumin: 4 g/dL (ref 3.5–5.0)
Alkaline Phosphatase: 51 U/L (ref 26–84)
Anion gap: 11 (ref 5–15)
BUN: 11 mg/dL (ref 7–22)
CO2: 30 mmol/L (ref 18–33)
Calcium: 10 mg/dL (ref 8.0–10.3)
Chloride: 104 mmol/L (ref 98–108)
Creatinine: 0.8 mg/dL (ref 0.60–1.20)
Glucose, Bld: 116 mg/dL (ref 73–118)
Potassium: 5.2 mmol/L — ABNORMAL HIGH (ref 3.3–4.7)
Sodium: 145 mmol/L (ref 128–145)
Total Bilirubin: 1.1 mg/dL (ref 0.2–1.6)
Total Protein: 7.8 g/dL (ref 6.4–8.1)

## 2018-01-29 NOTE — Progress Notes (Signed)
Hematology and Oncology Follow Up Visit  Rhonda Paul [id number] 09-06-1950 67 y.o. 01/29/2018   Principle Diagnosis:  Stage IIA (T2 N0 M0) ductal carcinoma of the right breast.  Current Therapy:    Observation     Interim History:  Ms.  Paul is back for follow-up. We see her yearly.  She is doing well.  She did have surgery on the big toe of her left foot.  She had a bad arthritis.  She had a joint replacement.  She is quite happy about this.  She is still working.  She is also traveling.  She will be going up to [place] this Labor Day weekend.  Her mother passed away couple years ago.  She and her family are trying to help clear out the house.  She has had no problems with infections.  She had no fever.  She has had no cough or shortness of breath.  There is been no change in bowel or bladder habits.  She is still doing yoga.  Her graph overall, she is in great shape.  She has the atrial fibrillation but this is very well rate controlled.    Overall, her performance status is ECOG 0.   Medications:  Current Outpatient Medications:  .  acetaminophen (TYLENOL) 500 MG tablet, Take 1,000 mg by mouth every 6 (six) hours as needed for moderate pain or headache. Reported on 09/05/2015, Disp: , Rfl:  .  apixaban (ELIQUIS) 5 MG TABS tablet, Take 1 tablet (5 mg total) by mouth 2 (two) times daily., Disp: 60 tablet, Rfl: 10 .  Ergocalciferol (VITAMIN D2) 2000 units TABS, Take 2,000 Units by mouth daily., Disp: , Rfl:  .  levothyroxine (SYNTHROID, LEVOTHROID) 50 MCG tablet, Take 1 tablet (50 mcg total) by mouth daily before breakfast., Disp: 30 tablet, Rfl: 12 .  metoprolol succinate (TOPROL-XL) 50 MG 24 hr tablet, Take 1 tablet (50 mg total) by mouth daily., Disp: 90 tablet, Rfl: 3 .  nitroGLYCERIN (NITROSTAT) 0.4 MG SL tablet, Place 1 tablet (0.4 mg total) under the tongue every 5 (five) minutes as needed for chest pain., Disp: 25 tablet, Rfl: 3 .  rosuvastatin (CRESTOR) 20 MG tablet,  Take 20 mg by mouth daily., Disp: , Rfl:   Allergies:  Allergies  Allergen Reactions  . Lambs Quarters Hives, Itching and Nausea And Vomiting  . Lanolin Acid [Lanolin] Rash  . Other Itching and Rash    wool    Past Medical History, Surgical history, Social history, and Family History were reviewed and updated.  Review of Systems: Review of Systems  Constitutional: Negative.   HENT: Negative.   Eyes: Negative.   Respiratory: Negative.   Cardiovascular: Negative.   Gastrointestinal: Negative.   Genitourinary: Negative.   Musculoskeletal: Negative.   Skin: Negative.   Neurological: Negative.   Endo/Heme/Allergies: Negative.   Psychiatric/Behavioral: Negative.      Physical Exam:  weight is 151 lb (68.5 kg). Her oral temperature is 98.3 F (36.8 C). Her blood pressure is 111/79 and her pulse is 64. Her respiration is 16 and oxygen saturation is 100%.    Physical Exam  Constitutional: She is oriented to person, place, and time.  HENT:  Head: Normocephalic and atraumatic.  Mouth/Throat: Oropharynx is clear and moist.  Eyes: Pupils are equal, round, and reactive to light. EOM are normal.  Neck: Normal range of motion.  Cardiovascular: Normal rate, regular rhythm and normal heart sounds.  Pulmonary/Chest: Effort normal and breath sounds normal.  Abdominal: Soft.  Bowel sounds are normal.  Musculoskeletal: Normal range of motion. She exhibits no edema, tenderness or deformity.  Lymphadenopathy:    She has no cervical adenopathy.  Neurological: She is alert and oriented to person, place, and time.  Skin: Skin is warm and dry. No rash noted. No erythema.  Psychiatric: She has a normal mood and affect. Her behavior is normal. Judgment and thought content normal.  Vitals reviewed.    Lab Results  Component Value Date   WBC 5.0 01/29/2018   HGB 13.5 01/29/2018   HCT 38.9 01/29/2018   MCV 99.2 01/29/2018   PLT 213 01/29/2018     Chemistry      Component Value Date/Time    NA 145 01/29/2018 0931   NA 142 08/25/2017 0826   NA 142 01/30/2017 0847   NA 140 02/01/2016 0841   K 5.2 (H) 01/29/2018 0931   K 4.9 (H) 01/30/2017 0847   K 4.8 02/01/2016 0841   CL 104 01/29/2018 0931   CL 104 01/30/2017 0847   CO2 30 01/29/2018 0931   CO2 32 01/30/2017 0847   CO2 27 02/01/2016 0841   BUN 11 01/29/2018 0931   BUN 10 08/25/2017 0826   BUN 11 01/30/2017 0847   BUN 12.7 02/01/2016 0841   CREATININE 0.80 01/29/2018 0931   CREATININE 1.0 01/30/2017 0847   CREATININE 0.8 02/01/2016 0841      Component Value Date/Time   CALCIUM 10.0 01/29/2018 0931   CALCIUM 9.6 01/30/2017 0847   CALCIUM 10.0 02/01/2016 0841   ALKPHOS 51 01/29/2018 0931   ALKPHOS 51 01/30/2017 0847   ALKPHOS 59 02/01/2016 0841   AST 28 01/29/2018 0931   AST 22 02/01/2016 0841   ALT 28 01/29/2018 0931   ALT 25 01/30/2017 0847   ALT 25 02/01/2016 0841   BILITOT 1.1 01/29/2018 0931   BILITOT 0.93 02/01/2016 0841         Impression and Plan: Rhonda Paul is a 67 year old female with a history of stage IIA invasive ductal carcinoma right breast. She was diagnosed about  15 years ago. Her tumor was ER negative. She received 6 cycles of FEC. She then received radiation.  She really looks great. She is exercising. I'm so happy for her. She is still working.  We will continue to see her back yearly. I don't see a problem with that. She can come back earlier if she has any problems.     Volanda Napoleon, MD 8/22/201910:28 AM

## 2018-01-30 LAB — VITAMIN D 25 HYDROXY (VIT D DEFICIENCY, FRACTURES): Vit D, 25-Hydroxy: 68.8 ng/mL (ref 30.0–100.0)

## 2018-02-23 ENCOUNTER — Other Ambulatory Visit: Payer: Self-pay | Admitting: Obstetrics & Gynecology

## 2018-02-23 NOTE — Telephone Encounter (Signed)
Medication refill request: levothyroxine  Last AEX:  02-03-17  Next AEX: 03-30-18  Last MMG (if hormonal medication request): 12-30-17 WNL  Refill authorized: please advise

## 2018-03-12 ENCOUNTER — Encounter: Payer: Self-pay | Admitting: Obstetrics & Gynecology

## 2018-03-12 ENCOUNTER — Ambulatory Visit (INDEPENDENT_AMBULATORY_CARE_PROVIDER_SITE_OTHER): Payer: Medicare HMO | Admitting: Obstetrics & Gynecology

## 2018-03-12 ENCOUNTER — Other Ambulatory Visit (HOSPITAL_COMMUNITY)
Admission: RE | Admit: 2018-03-12 | Discharge: 2018-03-12 | Disposition: A | Discharge status: Home / Self Care | Payer: Medicare HMO | Source: Ambulatory Visit | Attending: Obstetrics & Gynecology | Admitting: Obstetrics & Gynecology

## 2018-03-12 VITALS — BP 120/76 | HR 68 | Resp 16 | Ht 64.75 in | Wt 149.4 lb

## 2018-03-12 DIAGNOSIS — Z124 Encounter for screening for malignant neoplasm of cervix: Secondary | ICD-10-CM | POA: Diagnosis not present

## 2018-03-12 DIAGNOSIS — Z01419 Encounter for gynecological examination (general) (routine) without abnormal findings: Secondary | ICD-10-CM | POA: Diagnosis not present

## 2018-03-12 DIAGNOSIS — E2839 Other primary ovarian failure: Secondary | ICD-10-CM

## 2018-03-12 MED ORDER — LEVOTHYROXINE SODIUM 50 MCG PO TABS: ORAL_TABLET | ORAL | 4 refills | Status: DC

## 2018-03-12 NOTE — Progress Notes (Signed)
67 y.o. [id number] Married White or Caucasian female here for annual exam.  Doing well.  Was in [place] seeing family this summer.  The weather was lovely when she was there.  Had left joint replacement of big toe joint 9/18.  Was non-weightbearing for two weeks.  Was then in a boot.  Doing well now.    PCP:  Dr. Sabra Heck  Patient's last menstrual period was 11/08/2001 (approximate).          Sexually active: Yes.    The current method of family planning is post menopausal status.    Exercising: Yes.    yoga, treadmill Smoker:  no  Health Maintenance: Pap:  01/13/15 Neg. HR HPV:neg  History of abnormal Pap:  no MMG:  12/29/17 BIRADS1:neg  Colonoscopy:  12/24/17 diverticulosis. F/u 5 years.  Dr. Collene Mares. BMD:   01/20/14 Normal  TDaP:  2012 Pneumonia vaccine(s):  2019 Shingrix:   Completed  Hep C testing: 02/03/17 Neg  Screening Labs: Oncology    reports that she has never smoked. She has never used smokeless tobacco. She reports that she drinks about 7.0 standard drinks of alcohol per week. She reports that she does not use drugs.  Past Medical History:  Diagnosis Date  . Adenocarcinoma of breast ([place]) 06/03   RIGHT  . Arthritis   . Chronic anticoagulation    Eliquis  . Diverticulitis 2009   diverticulitis  . Heart attack ([place])   . HOH (hard of hearing)    right ear; wears hearing aid  . Hypertension   . Infertility, female 09/01   4 SAB  . Metrorrhagia   . Migraine 09/01  . Permanent atrial fibrillation 04/01/2014  . Personal history of chemotherapy 2003  . Personal history of radiation therapy 2003  . STD (sexually transmitted disease) 1976   Hx of HSV  . Stroke ([place]) 01/03/15   s/p TPA R-MCA  . Vitamin D deficiency 07/2006    Past Surgical History:  Procedure Laterality Date  . APPENDECTOMY  1959  . BREAST SURGERY Right 11/2001   lumpectomy with sentinel node biopsy X 3 all negative, ER/PR negative , chemo 6 doses, and radiation daily X 6 weeks  . CARDIAC CATHETERIZATION  N/A 01/16/2015   Procedure: Left Heart Cath and Coronary Angiography;  Surgeon: Belva Crome, MD; OM1 75%, RCA 30%, EF normal; spasm in RCA w/ catheter engagement   . COLONOSCOPY  11/2012   diverticulitis, 2 polyps negative, repeat in 5 years  . HALLUX FUSION Left 03/03/2017   Procedure: HALLUX RIGIDUS CORRECTION WITH COLECTOMY DEBRIDEMENT AND CAPSULAR RELEASE OF THE FIRST MPJ WITH IMPLANT LEFT FOOT;  Surgeon: Rosemary Holms, DPM;  Location: [facility];  Service: Podiatry;  Laterality: Left;  . JOINT REPLACEMENT Left 02/2017   big toe  . PORT-A-CATH REMOVAL    . RADIOLOGY WITH ANESTHESIA N/A 01/03/2015   Procedure: RADIOLOGY WITH ANESTHESIA;  Surgeon: Medication Radiologist, MD;  Location: [facility];  Service: Radiology;  Laterality: N/A;    Current Outpatient Medications  Medication Sig Dispense Refill  . acetaminophen (TYLENOL) 500 MG tablet Take 1,000 mg by mouth every 6 (six) hours as needed for moderate pain or headache. Reported on 09/05/2015    . apixaban (ELIQUIS) 5 MG TABS tablet Take 1 tablet (5 mg total) by mouth 2 (two) times daily. 60 tablet 10  . Ergocalciferol (VITAMIN D2) 2000 units TABS Take 2,000 Units by mouth daily.    Marland Kitchen levothyroxine (SYNTHROID, LEVOTHROID) 50 MCG tablet TAKE 1  TABLET BY MOUTH DAILY BEFORE BREAKFAST 90 tablet 0  . metoprolol succinate (TOPROL-XL) 50 MG 24 hr tablet Take 1 tablet (50 mg total) by mouth daily. 90 tablet 3  . rosuvastatin (CRESTOR) 20 MG tablet Take 20 mg by mouth daily.    . nitroGLYCERIN (NITROSTAT) 0.4 MG SL tablet Place 1 tablet (0.4 mg total) under the tongue every 5 (five) minutes as needed for chest pain. (Patient not taking: Reported on 03/12/2018) 25 tablet 3   No current facility-administered medications for this visit.     Family History  Problem Relation Age of Onset  . Colon cancer Mother   . Cervical cancer Mother   . Osteoporosis Mother   . Cancer Mother   . Heart disease Father   . Heart disease Brother   .  Breast cancer Maternal Aunt   . Cancer Maternal Aunt   . Multiple births Maternal Grandmother   . Multiple births Paternal Grandmother     Review of Systems  All other systems reviewed and are negative.   Exam:   BP 120/76 (BP Location: Right Arm, Patient Position: Sitting, Cuff Size: Normal)   Pulse 68   Resp 16   Ht 5' 4.75" (1.645 m)   Wt 149 lb 6.4 oz (67.8 kg)   LMP 11/08/2001 (Approximate)   BMI 25.05 kg/m     Height: 5' 4.75" (164.5 cm)  Ht Readings from Last 3 Encounters:  03/12/18 5' 4.75" (1.645 m)  08/27/17 5\' 5"  (1.651 m)  03/03/17 5' 5.5" (1.664 m)    General appearance: alert, cooperative and appears stated age Head: Normocephalic, without obvious abnormality, atraumatic Neck: no adenopathy, supple, symmetrical, trachea midline and thyroid normal to inspection and palpation Lungs: clear to auscultation bilaterally Breasts: right breast with well healed incisions and stable radiation changes, left breast without masses, skin changes or LAD Heart: regular rate and rhythm Abdomen: soft, non-tender; bowel sounds normal; no masses,  no organomegaly Extremities: extremities normal, atraumatic, no cyanosis or edema Skin: Skin color, texture, turgor normal. No rashes or lesions Lymph nodes: Cervical, supraclavicular, and axillary nodes normal. No abnormal inguinal nodes palpated Neurologic: Grossly normal   Pelvic: External genitalia:  no lesions              Urethra:  normal appearing urethra with no masses, tenderness or lesions              Bartholins and Skenes: normal                 Vagina: normal appearing vagi...a with normal color and discharge, no lesions              Cervix: no lesions              Pap taken: Yes.   Bimanual Exam:  Uterus:  normal size, contour, position, consistency, mobility, non-tender              Adnexa: normal adnexa and no mass, fullness, tenderness               Rectovaginal: Confirms               Anus:  normal sphincter tone,  no lesions  Chaperone was present for exam.  A:  Well Woman with normal exam PMP, no HRT H/o Stage 2A triple negative breast cancer 6/03.  Treated with chemotherapy and radiation Hypothyroidism H/O MI and CVA 8/16 (used estring in the past)  P:   Mammogram guidelines reviewed pap smear  obtained today.  Neg pap and neg HR HPV 8/16 Lab work is UTD Synthroid 56mcg daily.  #90/4FRF BMD order placed for next year to do with MMG Pt aware to completed second pneumonia vaccination (pneumovax) next year return annually or prn

## 2018-03-16 LAB — CYTOLOGY - PAP: Diagnosis: NEGATIVE

## 2018-03-28 ENCOUNTER — Other Ambulatory Visit: Payer: Self-pay | Admitting: Interventional Cardiology

## 2018-03-30 ENCOUNTER — Ambulatory Visit: Payer: Medicare Other | Admitting: Obstetrics & Gynecology

## 2018-03-31 DIAGNOSIS — R69 Illness, unspecified: Secondary | ICD-10-CM | POA: Diagnosis not present

## 2018-05-12 DIAGNOSIS — R69 Illness, unspecified: Secondary | ICD-10-CM | POA: Diagnosis not present

## 2018-06-29 ENCOUNTER — Other Ambulatory Visit: Payer: Self-pay | Admitting: *Deleted

## 2018-06-29 DIAGNOSIS — I4821 Permanent atrial fibrillation: Secondary | ICD-10-CM

## 2018-06-29 DIAGNOSIS — E782 Mixed hyperlipidemia: Secondary | ICD-10-CM

## 2018-06-29 DIAGNOSIS — Z7901 Long term (current) use of anticoagulants: Secondary | ICD-10-CM

## 2018-07-01 DIAGNOSIS — M7712 Lateral epicondylitis, left elbow: Secondary | ICD-10-CM | POA: Diagnosis not present

## 2018-07-01 DIAGNOSIS — M7522 Bicipital tendinitis, left shoulder: Secondary | ICD-10-CM | POA: Diagnosis not present

## 2018-07-01 DIAGNOSIS — M7918 Myalgia, other site: Secondary | ICD-10-CM | POA: Diagnosis not present

## 2018-07-10 DIAGNOSIS — M546 Pain in thoracic spine: Secondary | ICD-10-CM | POA: Diagnosis not present

## 2018-07-10 DIAGNOSIS — M7712 Lateral epicondylitis, left elbow: Secondary | ICD-10-CM | POA: Diagnosis not present

## 2018-07-10 DIAGNOSIS — M62830 Muscle spasm of back: Secondary | ICD-10-CM | POA: Diagnosis not present

## 2018-07-10 DIAGNOSIS — M25522 Pain in left elbow: Secondary | ICD-10-CM | POA: Diagnosis not present

## 2018-07-14 DIAGNOSIS — M7712 Lateral epicondylitis, left elbow: Secondary | ICD-10-CM | POA: Diagnosis not present

## 2018-07-14 DIAGNOSIS — M62830 Muscle spasm of back: Secondary | ICD-10-CM | POA: Diagnosis not present

## 2018-07-14 DIAGNOSIS — M25522 Pain in left elbow: Secondary | ICD-10-CM | POA: Diagnosis not present

## 2018-07-14 DIAGNOSIS — M546 Pain in thoracic spine: Secondary | ICD-10-CM | POA: Diagnosis not present

## 2018-07-16 DIAGNOSIS — M546 Pain in thoracic spine: Secondary | ICD-10-CM | POA: Diagnosis not present

## 2018-07-16 DIAGNOSIS — M7712 Lateral epicondylitis, left elbow: Secondary | ICD-10-CM | POA: Diagnosis not present

## 2018-07-16 DIAGNOSIS — M62830 Muscle spasm of back: Secondary | ICD-10-CM | POA: Diagnosis not present

## 2018-07-16 DIAGNOSIS — M25522 Pain in left elbow: Secondary | ICD-10-CM | POA: Diagnosis not present

## 2018-07-24 DIAGNOSIS — M25522 Pain in left elbow: Secondary | ICD-10-CM | POA: Diagnosis not present

## 2018-07-24 DIAGNOSIS — M7712 Lateral epicondylitis, left elbow: Secondary | ICD-10-CM | POA: Diagnosis not present

## 2018-07-24 DIAGNOSIS — M62830 Muscle spasm of back: Secondary | ICD-10-CM | POA: Diagnosis not present

## 2018-07-24 DIAGNOSIS — M546 Pain in thoracic spine: Secondary | ICD-10-CM | POA: Diagnosis not present

## 2018-07-28 DIAGNOSIS — M7712 Lateral epicondylitis, left elbow: Secondary | ICD-10-CM | POA: Diagnosis not present

## 2018-07-28 DIAGNOSIS — M25522 Pain in left elbow: Secondary | ICD-10-CM | POA: Diagnosis not present

## 2018-07-28 DIAGNOSIS — M546 Pain in thoracic spine: Secondary | ICD-10-CM | POA: Diagnosis not present

## 2018-07-28 DIAGNOSIS — M62830 Muscle spasm of back: Secondary | ICD-10-CM | POA: Diagnosis not present

## 2018-07-30 DIAGNOSIS — M7712 Lateral epicondylitis, left elbow: Secondary | ICD-10-CM | POA: Diagnosis not present

## 2018-07-30 DIAGNOSIS — M62830 Muscle spasm of back: Secondary | ICD-10-CM | POA: Diagnosis not present

## 2018-07-30 DIAGNOSIS — M546 Pain in thoracic spine: Secondary | ICD-10-CM | POA: Diagnosis not present

## 2018-07-30 DIAGNOSIS — M25522 Pain in left elbow: Secondary | ICD-10-CM | POA: Diagnosis not present

## 2018-08-04 DIAGNOSIS — M546 Pain in thoracic spine: Secondary | ICD-10-CM | POA: Diagnosis not present

## 2018-08-04 DIAGNOSIS — M25522 Pain in left elbow: Secondary | ICD-10-CM | POA: Diagnosis not present

## 2018-08-04 DIAGNOSIS — M62830 Muscle spasm of back: Secondary | ICD-10-CM | POA: Diagnosis not present

## 2018-08-04 DIAGNOSIS — M7712 Lateral epicondylitis, left elbow: Secondary | ICD-10-CM | POA: Diagnosis not present

## 2018-08-06 DIAGNOSIS — M62830 Muscle spasm of back: Secondary | ICD-10-CM | POA: Diagnosis not present

## 2018-08-06 DIAGNOSIS — M25522 Pain in left elbow: Secondary | ICD-10-CM | POA: Diagnosis not present

## 2018-08-06 DIAGNOSIS — M546 Pain in thoracic spine: Secondary | ICD-10-CM | POA: Diagnosis not present

## 2018-08-06 DIAGNOSIS — M7712 Lateral epicondylitis, left elbow: Secondary | ICD-10-CM | POA: Diagnosis not present

## 2018-08-11 DIAGNOSIS — M62830 Muscle spasm of back: Secondary | ICD-10-CM | POA: Diagnosis not present

## 2018-08-11 DIAGNOSIS — M7712 Lateral epicondylitis, left elbow: Secondary | ICD-10-CM | POA: Diagnosis not present

## 2018-08-11 DIAGNOSIS — M25522 Pain in left elbow: Secondary | ICD-10-CM | POA: Diagnosis not present

## 2018-08-11 DIAGNOSIS — M546 Pain in thoracic spine: Secondary | ICD-10-CM | POA: Diagnosis not present

## 2018-08-13 DIAGNOSIS — M25522 Pain in left elbow: Secondary | ICD-10-CM | POA: Diagnosis not present

## 2018-08-13 DIAGNOSIS — M62830 Muscle spasm of back: Secondary | ICD-10-CM | POA: Diagnosis not present

## 2018-08-13 DIAGNOSIS — M546 Pain in thoracic spine: Secondary | ICD-10-CM | POA: Diagnosis not present

## 2018-08-13 DIAGNOSIS — M7712 Lateral epicondylitis, left elbow: Secondary | ICD-10-CM | POA: Diagnosis not present

## 2018-08-20 DIAGNOSIS — M7712 Lateral epicondylitis, left elbow: Secondary | ICD-10-CM | POA: Diagnosis not present

## 2018-08-20 DIAGNOSIS — M546 Pain in thoracic spine: Secondary | ICD-10-CM | POA: Diagnosis not present

## 2018-08-20 DIAGNOSIS — M25522 Pain in left elbow: Secondary | ICD-10-CM | POA: Diagnosis not present

## 2018-08-20 DIAGNOSIS — M62830 Muscle spasm of back: Secondary | ICD-10-CM | POA: Diagnosis not present

## 2018-09-07 ENCOUNTER — Other Ambulatory Visit: Payer: Medicare HMO

## 2018-09-11 ENCOUNTER — Ambulatory Visit: Payer: Medicare HMO | Admitting: Interventional Cardiology

## 2018-09-21 MED ORDER — ROSUVASTATIN CALCIUM 20 MG PO TABS: 20.0000 mg | ORAL_TABLET | Freq: Every day | ORAL | 0 refills | Status: DC

## 2018-09-21 NOTE — Telephone Encounter (Signed)
Pt's medication was sent to pt's pharmacy as requested. Confirmation received.  °

## 2018-09-29 ENCOUNTER — Other Ambulatory Visit: Payer: Medicare HMO

## 2018-09-29 ENCOUNTER — Other Ambulatory Visit: Payer: Self-pay

## 2018-09-29 DIAGNOSIS — I4821 Permanent atrial fibrillation: Secondary | ICD-10-CM | POA: Diagnosis not present

## 2018-09-29 DIAGNOSIS — Z7901 Long term (current) use of anticoagulants: Secondary | ICD-10-CM | POA: Diagnosis not present

## 2018-09-29 DIAGNOSIS — E782 Mixed hyperlipidemia: Secondary | ICD-10-CM | POA: Diagnosis not present

## 2018-09-29 LAB — BASIC METABOLIC PANEL
BUN/Creatinine Ratio: 12 (ref 12–28)
BUN: 10 mg/dL (ref 8–27)
CO2: 22 mmol/L (ref 20–29)
Calcium: 9.7 mg/dL (ref 8.7–10.3)
Chloride: 101 mmol/L (ref 96–106)
Creatinine, Ser: 0.83 mg/dL (ref 0.57–1.00)
GFR calc Af Amer: 85 mL/min/{1.73_m2} (ref >59)
GFR calc non Af Amer: 74 mL/min/{1.73_m2} (ref >59)
Glucose: 99 mg/dL (ref 65–99)
Potassium: 5 mmol/L (ref 3.5–5.2)
Sodium: 140 mmol/L (ref 134–144)

## 2018-09-29 LAB — LIPID PANEL
Chol/HDL Ratio: 2.4 ratio (ref 0.0–4.4)
Cholesterol, Total: 188 mg/dL (ref 100–199)
HDL: 80 mg/dL (ref >39)
LDL Calculated: 81 mg/dL (ref 0–99)
Triglycerides: 135 mg/dL (ref 0–149)
VLDL Cholesterol Cal: 27 mg/dL (ref 5–40)

## 2018-09-29 LAB — CBC
Hematocrit: 40.6 % (ref 34.0–46.6)
Hemoglobin: 13.2 g/dL (ref 11.1–15.9)
MCH: 33.5 pg — ABNORMAL HIGH (ref 26.6–33.0)
MCHC: 32.5 g/dL (ref 31.5–35.7)
MCV: 103 fL — ABNORMAL HIGH (ref 79–97)
Platelets: 241 10*3/uL (ref 150–450)
RBC: 3.94 x10E6/uL (ref 3.77–5.28)
RDW: 12.4 % (ref 11.7–15.4)
WBC: 5.4 10*3/uL (ref 3.4–10.8)

## 2018-09-29 LAB — HEPATIC FUNCTION PANEL
ALT: 19 IU/L (ref 0–32)
AST: 20 IU/L (ref 0–40)
Albumin: 4.5 g/dL (ref 3.8–4.8)
Alkaline Phosphatase: 48 IU/L (ref 39–117)
Bilirubin Total: 0.6 mg/dL (ref 0.0–1.2)
Bilirubin, Direct: 0.19 mg/dL (ref 0.00–0.40)
Total Protein: 7.4 g/dL (ref 6.0–8.5)

## 2018-10-01 ENCOUNTER — Telehealth: Payer: Medicare HMO | Admitting: Interventional Cardiology

## 2018-10-06 DIAGNOSIS — I252 Old myocardial infarction: Secondary | ICD-10-CM | POA: Diagnosis not present

## 2018-10-06 DIAGNOSIS — Z853 Personal history of malignant neoplasm of breast: Secondary | ICD-10-CM | POA: Diagnosis not present

## 2018-10-06 DIAGNOSIS — E039 Hypothyroidism, unspecified: Secondary | ICD-10-CM | POA: Diagnosis not present

## 2018-10-06 DIAGNOSIS — H9191 Unspecified hearing loss, right ear: Secondary | ICD-10-CM | POA: Diagnosis not present

## 2018-10-06 DIAGNOSIS — I480 Paroxysmal atrial fibrillation: Secondary | ICD-10-CM | POA: Diagnosis not present

## 2018-10-06 DIAGNOSIS — R0683 Snoring: Secondary | ICD-10-CM | POA: Diagnosis not present

## 2018-10-06 DIAGNOSIS — Z0001 Encounter for general adult medical examination with abnormal findings: Secondary | ICD-10-CM | POA: Diagnosis not present

## 2018-10-06 DIAGNOSIS — E7800 Pure hypercholesterolemia, unspecified: Secondary | ICD-10-CM | POA: Diagnosis not present

## 2018-10-09 ENCOUNTER — Other Ambulatory Visit: Payer: Medicare HMO

## 2018-10-13 ENCOUNTER — Ambulatory Visit: Payer: Medicare HMO | Admitting: Interventional Cardiology

## 2018-10-21 ENCOUNTER — Other Ambulatory Visit: Payer: Self-pay | Admitting: Interventional Cardiology

## 2018-10-21 DIAGNOSIS — E559 Vitamin D deficiency, unspecified: Secondary | ICD-10-CM

## 2018-10-21 DIAGNOSIS — C50912 Malignant neoplasm of unspecified site of left female breast: Secondary | ICD-10-CM

## 2018-10-26 DIAGNOSIS — R0681 Apnea, not elsewhere classified: Secondary | ICD-10-CM | POA: Diagnosis not present

## 2018-11-09 ENCOUNTER — Other Ambulatory Visit: Payer: Self-pay | Admitting: *Deleted

## 2018-11-09 MED ORDER — ATORVASTATIN CALCIUM 40 MG PO TABS: 40.0000 mg | ORAL_TABLET | Freq: Every day | ORAL | 3 refills | Status: DC

## 2018-11-16 DIAGNOSIS — G4733 Obstructive sleep apnea (adult) (pediatric): Secondary | ICD-10-CM | POA: Diagnosis not present

## 2018-11-19 DIAGNOSIS — G4733 Obstructive sleep apnea (adult) (pediatric): Secondary | ICD-10-CM | POA: Diagnosis not present

## 2018-11-21 ENCOUNTER — Other Ambulatory Visit: Payer: Self-pay | Admitting: Interventional Cardiology

## 2018-11-21 DIAGNOSIS — I4821 Permanent atrial fibrillation: Secondary | ICD-10-CM

## 2018-11-21 DIAGNOSIS — M818 Other osteoporosis without current pathological fracture: Secondary | ICD-10-CM

## 2018-11-21 DIAGNOSIS — E782 Mixed hyperlipidemia: Secondary | ICD-10-CM

## 2018-11-23 NOTE — Telephone Encounter (Signed)
Age 68, weight 68kg, SCr 0.83 on 09/29/18, afib indication, appt with Dr Tamala Julian in Sept

## 2018-12-01 DIAGNOSIS — G4733 Obstructive sleep apnea (adult) (pediatric): Secondary | ICD-10-CM | POA: Diagnosis not present

## 2018-12-02 ENCOUNTER — Other Ambulatory Visit: Payer: Self-pay | Admitting: Obstetrics & Gynecology

## 2018-12-02 DIAGNOSIS — Z23 Encounter for immunization: Secondary | ICD-10-CM | POA: Diagnosis not present

## 2018-12-02 DIAGNOSIS — Z1231 Encounter for screening mammogram for malignant neoplasm of breast: Secondary | ICD-10-CM

## 2018-12-28 ENCOUNTER — Telehealth: Payer: Self-pay | Admitting: Interventional Cardiology

## 2018-12-28 NOTE — Progress Notes (Signed)
Cardiology Office Note:    Date:  12/29/2018   ID:  Rhonda Paul, DOB 02/16/1951, MRN [id number]  PCP:  Kathyrn Lass, MD  Cardiologist:  Sinclair Grooms, MD   Referring MD: Kathyrn Lass, MD   Chief Complaint  Patient presents with  . Atrial Fibrillation    History of Present Illness:    Rhonda Paul is a 68 y.o. female with a hx of CAD with nonobstructive CAD by cath 2016 (60 to 70% obtuse marginal and 68% RCA), embolic CVA 3419, permanent atrial fibrillation, non-ST elevation myocardial infarction due to plaque rupture, an chronic anticoagulation therapy.  Feels well.  Trying to remain physically active but difficult during the [place] pandemic.  No blood in the stool, no neurological complaints, and no chest pain or dyspnea.  Compliant with her current medical regimen.  New diagnosis of obstructive sleep apnea and now on CPAP.  Past Medical History:  Diagnosis Date  . Adenocarcinoma of breast ([place]) 06/03   RIGHT  . Arthritis   . Chronic anticoagulation    Eliquis  . Diverticulitis 2009   diverticulitis  . Heart attack ([place])   . HOH (hard of hearing)    right ear; wears hearing aid  . Hypertension   . Infertility, female 09/01   4 SAB  . Metrorrhagia   . Migraine 09/01  . Permanent atrial fibrillation 04/01/2014  . Personal history of chemotherapy 2003  . Personal history of radiation therapy 2003  . STD (sexually transmitted disease) 1976   Hx of HSV  . Stroke ([place]) 01/03/15   s/p TPA R-MCA  . Vitamin D deficiency 07/2006    Past Surgical History:  Procedure Laterality Date  . APPENDECTOMY  1959  . BREAST SURGERY Right 11/2001   lumpectomy with sentinel node biopsy X 3 all negative, ER/PR negative , chemo 6 doses, and radiation daily X 6 weeks  . CARDIAC CATHETERIZATION N/A 01/16/2015   Procedure: Left Heart Cath and Coronary Angiography;  Surgeon: Belva Crome, MD; OM1 75%, RCA 30%, EF normal; spasm in RCA w/ catheter engagement   . COLONOSCOPY   11/2012   diverticulitis, 2 polyps negative, repeat in 5 years  . HALLUX FUSION Left 03/03/2017   Procedure: HALLUX RIGIDUS CORRECTION WITH COLECTOMY DEBRIDEMENT AND CAPSULAR RELEASE OF THE FIRST MPJ WITH IMPLANT LEFT FOOT;  Surgeon: Rosemary Holms, DPM;  Location: [facility];  Service: Podiatry;  Laterality: Left;  . JOINT REPLACEMENT Left 02/2017   big toe  . PORT-A-CATH REMOVAL    . RADIOLOGY WITH ANESTHESIA N/A 01/03/2015   Procedure: RADIOLOGY WITH ANESTHESIA;  Surgeon: Medication Radiologist, MD;  Location: [facility];  Service: Radiology;  Laterality: N/A;    Current Medications: Current Meds  Medication Sig  . acetaminophen (TYLENOL) 500 MG tablet Take 1,000 mg by mouth every 6 (six) hours as needed for moderate pain or headache. Reported on 09/05/2015  . atorvastatin (LIPITOR) 40 MG tablet Take 1 tablet (40 mg total) by mouth daily.  Marland Kitchen ELIQUIS 5 MG TABS tablet TAKE ONE TABLET BY MOUTH TWICE A DAY  . Ergocalciferol (VITAMIN D2) 2000 units TABS Take 2,000 Units by mouth daily.  Marland Kitchen levothyroxine (SYNTHROID, LEVOTHROID) 50 MCG tablet TAKE 1 TABLET BY MOUTH DAILY BEFORE BREAKFAST  . metoprolol succinate (TOPROL-XL) 50 MG 24 hr tablet TAKE ONE TABLET BY MOUTH DAILY  . nitroGLYCERIN (NITROSTAT) 0.4 MG SL tablet Place 1 tablet (0.4 mg total) under the tongue every 5 (five) minutes as needed for  chest pain.  . [DISCONTINUED] nitroGLYCERIN (NITROSTAT) 0.4 MG SL tablet Place 1 tablet (0.4 mg total) under the tongue every 5 (five) minutes as needed for chest pain.     Allergies:   Lambs quarters, Lanolin acid [lanolin], and Other   Social History   Socioeconomic History  . Marital status: Married    Spouse name: Not on file  . Number of children: Not on file  . Years of education: Not on file  . Highest education level: Not on file  Occupational History  . Not on file  Social Needs  . Financial resource strain: Not on file  . Food insecurity    Worry: Not on file     Inability: Not on file  . Transportation needs    Medical: Not on file    Non-medical: Not on file  Tobacco Use  . Smoking status: Never Smoker  . Smokeless tobacco: Never Used  . Tobacco comment: never used tobacco  Substance and Sexual Activity  . Alcohol use: Yes    Alcohol/week: 7.0 standard drinks    Types: 7 Glasses of wine per week  . Drug use: No  . Sexual activity: Yes    Partners: Male    Birth control/protection: Post-menopausal  Lifestyle  . Physical activity    Days per week: Not on file    Minutes per session: Not on file  . Stress: Not on file  Relationships  . Social Herbalist on phone: Not on file    Gets together: Not on file    Attends religious service: Not on file    Active member of club or organization: Not on file    Attends meetings of clubs or organizations: Not on file    Relationship status: Not on file  Other Topics Concern  . Not on file  Social History Narrative  . Not on file     Family History: The patient's family history includes Breast cancer in her maternal aunt; Cancer in her maternal aunt and mother; Cervical cancer in her mother; Colon cancer in her mother; Heart disease in her brother and father; Multiple births in her maternal grandmother and paternal grandmother; Osteoporosis in her mother.  ROS:   Please see the history of present illness.    Slight weight gain.  Not as physically active as she once was due to the COVID-19 pandemic.  All other systems reviewed and are negative.  EKGs/Labs/Other Studies Reviewed:    The following studies were reviewed today: No new imaging  EKG:  EKG low voltage, atrial fibrillation with controlled ventricular response.  Left axis deviation.  Poor anterior forces.  Recent Labs: 09/29/2018: ALT 19; BUN 10; Creatinine, Ser 0.83; Hemoglobin 13.2; Platelets 241; Potassium 5.0; Sodium 140  Recent Lipid Panel    Component Value Date/Time   CHOL 188 09/29/2018 0839   TRIG 135  09/29/2018 0839   HDL 80 09/29/2018 0839   CHOLHDL 2.4 09/29/2018 0839   CHOLHDL 2.2 09/25/2015 0959   VLDL 17 09/25/2015 0959   LDLCALC 81 09/29/2018 0839    Physical Exam:    VS:  BP 114/68   Pulse 67   Ht 5' 4.75" (1.645 m)   Wt 155 lb (70.3 kg)   LMP 11/08/2001 (Approximate)   SpO2 100%   BMI 25.99 kg/m     Wt Readings from Last 3 Encounters:  12/29/18 155 lb (70.3 kg)  03/12/18 149 lb 6.4 oz (67.8 kg)  01/29/18 151 lb (68.5  kg)     GEN: Healthy-appearing. No acute distress HEENT: Normal NECK: No JVD. LYMPHATICS: No lymphadenopathy CARDIAC: II RR without murmur, gallop, or edema. VASCULAR:  Normal Pulses. No bruits. RESPIRATORY:  Clear to auscultation without rales, wheezing or rhonchi  ABDOMEN: Soft, non-tender, non-distended, No pulsatile mass, MUSCULOSKELETAL: No deformity  SKIN: Warm and dry NEUROLOGIC:  Alert and oriented x 3 PSYCHIATRIC:  Normal affect   ASSESSMENT:    1. Permanent atrial fibrillation   2. Mixed hyperlipidemia   3. Chronic anticoagulation    4. Moderate mitral regurgitation   5. CAD in native artery   6. Educated About Covid-19 Virus Infection   7. Obstructive sleep apnea on CPAP    PLAN:    In order of problems listed above:  1. Controlled rate and appropriately anticoagulated. 2. Target LDL should be less than 70.  She is 82.  Intentional discussion concerning lipid lowering.  She will watch her diet and exercise more.  Our target range should be 50-70 for LDL. 3. No evidence of bleeding.  Creatinine is normal. 4. Not audible on exam 5. Nonobstructive disease without symptoms of angina 6. Masking, handwashing, and social distancing or reiterated 7. Compliance with CPAP discussed.  Relevance to her blood pressure and atrial fibrillation were discussed.  Overall education and awareness concerning primary/secondary risk prevention was discussed in detail: LDL less than 70, hemoglobin A1c less than 7, blood pressure target less than  130/80 mmHg, >150 minutes of moderate aerobic activity per week, avoidance of smoking, weight control (via diet and exercise), and continued surveillance/management of/for obstructive sleep apnea.    Medication Adjustments/Labs and Tests Ordered: Current medicines are reviewed at length with the patient today.  Concerns regarding medicines are outlined above.  Orders Placed This Encounter  Procedures  . EKG 12-Lead   Meds ordered this encounter  Medications  . nitroGLYCERIN (NITROSTAT) 0.4 MG SL tablet    Sig: Place 1 tablet (0.4 mg total) under the tongue every 5 (five) minutes as needed for chest pain.    Dispense:  25 tablet    Refill:  3    Patient Instructions  Medication Instructions:  Your physician recommends that you continue on your current medications as directed. Please refer to the Current Medication list given to you today.  If you need a refill on your cardiac medications before your next appointment, please call your pharmacy.   Lab work: None If you have labs (blood work) drawn today and your tests are completely normal, you will receive your results only by: Marland Kitchen MyChart Message (if you have MyChart) OR . A paper copy in the mail If you have any lab test that is abnormal or we need to change your treatment, we will call you to review the results.  Testing/Procedures: None  Follow-Up: At [facility], you and your health needs are our priority.  As part of our continuing mission to provide you with exceptional heart care, we have created designated Provider Care Teams.  These Care Teams include your primary Cardiologist (physician) and Advanced Practice Providers (APPs -  Physician Assistants and Nurse Practitioners) who all work together to provide you with the care you need, when you need it. You will need a follow up appointment in 12 months.  Please call our office 2 months in advance to schedule this appointment.  You may see Sinclair Grooms, MD or one of  the following Advanced Practice Providers on your designated Care Team:   Truitt Merle,  NP Cecilie Kicks, NP . Kathyrn Drown, NP  Any Other Special Instructions Will Be Listed Below (If Applicable).  Your provider recommends that you maintain 150-300 minutes per week of moderate aerobic activity.       Signed, Sinclair Grooms, MD  12/29/2018 10:35 AM    [place]

## 2018-12-28 NOTE — Telephone Encounter (Signed)

## 2018-12-29 ENCOUNTER — Encounter: Payer: Self-pay | Admitting: Interventional Cardiology

## 2018-12-29 ENCOUNTER — Ambulatory Visit: Payer: Medicare HMO | Admitting: Interventional Cardiology

## 2018-12-29 ENCOUNTER — Other Ambulatory Visit: Payer: Self-pay

## 2018-12-29 VITALS — BP 114/68 | HR 67 | Ht 64.75 in | Wt 155.0 lb

## 2018-12-29 DIAGNOSIS — G4733 Obstructive sleep apnea (adult) (pediatric): Secondary | ICD-10-CM

## 2018-12-29 DIAGNOSIS — I2510 Atherosclerotic heart disease of native coronary artery without angina pectoris: Secondary | ICD-10-CM | POA: Diagnosis not present

## 2018-12-29 DIAGNOSIS — I340 Nonrheumatic mitral (valve) insufficiency: Secondary | ICD-10-CM

## 2018-12-29 DIAGNOSIS — I4821 Permanent atrial fibrillation: Secondary | ICD-10-CM

## 2018-12-29 DIAGNOSIS — Z9989 Dependence on other enabling machines and devices: Secondary | ICD-10-CM | POA: Diagnosis not present

## 2018-12-29 DIAGNOSIS — E782 Mixed hyperlipidemia: Secondary | ICD-10-CM | POA: Diagnosis not present

## 2018-12-29 DIAGNOSIS — Z7189 Other specified counseling: Secondary | ICD-10-CM | POA: Diagnosis not present

## 2018-12-29 DIAGNOSIS — Z7901 Long term (current) use of anticoagulants: Secondary | ICD-10-CM | POA: Diagnosis not present

## 2018-12-29 MED ORDER — NITROGLYCERIN 0.4 MG SL SUBL: 0.4000 mg | SUBLINGUAL_TABLET | SUBLINGUAL | 3 refills | Status: DC | PRN

## 2018-12-29 NOTE — Patient Instructions (Signed)
Medication Instructions:  Your physician recommends that you continue on your current medications as directed. Please refer to the Current Medication list given to you today.  If you need a refill on your cardiac medications before your next appointment, please call your pharmacy.   Lab work: None If you have labs (blood work) drawn today and your tests are completely normal, you will receive your results only by: Marland Kitchen MyChart Message (if you have MyChart) OR . A paper copy in the mail If you have any lab test that is abnormal or we need to change your treatment, we will call you to review the results.  Testing/Procedures: None  Follow-Up: At [facility], you and your health needs are our priority.  As part of our continuing mission to provide you with exceptional heart care, we have created designated Provider Care Teams.  These Care Teams include your primary Cardiologist (physician) and Advanced Practice Providers (APPs -  Physician Assistants and Nurse Practitioners) who all work together to provide you with the care you need, when you need it. You will need a follow up appointment in 12 months.  Please call our office 2 months in advance to schedule this appointment.  You may see Sinclair Grooms, MD or one of the following Advanced Practice Providers on your designated Care Team:   Truitt Merle, NP Cecilie Kicks, NP . Kathyrn Drown, NP  Any Other Special Instructions Will Be Listed Below (If Applicable).  Your provider recommends that you maintain 150-300 minutes per week of moderate aerobic activity.

## 2018-12-31 DIAGNOSIS — G4733 Obstructive sleep apnea (adult) (pediatric): Secondary | ICD-10-CM | POA: Diagnosis not present

## 2019-01-19 ENCOUNTER — Other Ambulatory Visit: Payer: Self-pay | Admitting: Interventional Cardiology

## 2019-01-19 DIAGNOSIS — E559 Vitamin D deficiency, unspecified: Secondary | ICD-10-CM

## 2019-01-19 DIAGNOSIS — C50912 Malignant neoplasm of unspecified site of left female breast: Secondary | ICD-10-CM

## 2019-01-29 ENCOUNTER — Ambulatory Visit: Payer: Medicare HMO | Admitting: Hematology & Oncology

## 2019-01-29 ENCOUNTER — Other Ambulatory Visit: Payer: Medicare HMO

## 2019-01-31 DIAGNOSIS — G4733 Obstructive sleep apnea (adult) (pediatric): Secondary | ICD-10-CM | POA: Diagnosis not present

## 2019-02-02 DIAGNOSIS — R69 Illness, unspecified: Secondary | ICD-10-CM | POA: Diagnosis not present

## 2019-02-09 DIAGNOSIS — H5203 Hypermetropia, bilateral: Secondary | ICD-10-CM | POA: Diagnosis not present

## 2019-02-10 ENCOUNTER — Ambulatory Visit: Payer: Medicare HMO | Admitting: Interventional Cardiology

## 2019-02-10 DIAGNOSIS — G4733 Obstructive sleep apnea (adult) (pediatric): Secondary | ICD-10-CM | POA: Diagnosis not present

## 2019-02-11 ENCOUNTER — Ambulatory Visit
Admission: RE | Admit: 2019-02-11 | Discharge: 2019-02-11 | Disposition: A | Discharge status: Home / Self Care | Payer: Medicare HMO | Source: Ambulatory Visit | Attending: Obstetrics & Gynecology | Admitting: Obstetrics & Gynecology

## 2019-02-11 ENCOUNTER — Other Ambulatory Visit: Payer: Self-pay

## 2019-02-11 ENCOUNTER — Ambulatory Visit: Payer: Medicare HMO

## 2019-02-11 DIAGNOSIS — Z780 Asymptomatic menopausal state: Secondary | ICD-10-CM | POA: Diagnosis not present

## 2019-02-11 DIAGNOSIS — Z1231 Encounter for screening mammogram for malignant neoplasm of breast: Secondary | ICD-10-CM | POA: Diagnosis not present

## 2019-02-11 DIAGNOSIS — Z13820 Encounter for screening for osteoporosis: Secondary | ICD-10-CM | POA: Diagnosis not present

## 2019-02-11 DIAGNOSIS — E2839 Other primary ovarian failure: Secondary | ICD-10-CM

## 2019-02-23 ENCOUNTER — Telehealth: Payer: Self-pay | Admitting: Obstetrics & Gynecology

## 2019-02-23 ENCOUNTER — Encounter: Payer: Self-pay | Admitting: Obstetrics & Gynecology

## 2019-02-23 NOTE — Telephone Encounter (Signed)
Immunization record updated.   MyChart message to patient to notify.   Encounter closed.

## 2019-02-23 NOTE — Telephone Encounter (Signed)
Patient sent the following correspondence through [facility].    I got my flu shot for this year on 02/08/2019 at [facility].  I wanted to get that in my record but wasn't sure how to make that happen.  There is no hurry, but I know that I'll forget whether I got it or not in a few months, so it helps to have it in the system.  And last year I did it there and never thought about getting in my immunization list.  Thanks for your assistance, wasn't sure who to contact about this.  I hope you are doing well. [employer]

## 2019-03-03 DIAGNOSIS — G4733 Obstructive sleep apnea (adult) (pediatric): Secondary | ICD-10-CM | POA: Diagnosis not present

## 2019-03-23 DIAGNOSIS — D225 Melanocytic nevi of trunk: Secondary | ICD-10-CM | POA: Diagnosis not present

## 2019-03-23 DIAGNOSIS — D1801 Hemangioma of skin and subcutaneous tissue: Secondary | ICD-10-CM | POA: Diagnosis not present

## 2019-03-23 DIAGNOSIS — L570 Actinic keratosis: Secondary | ICD-10-CM | POA: Diagnosis not present

## 2019-03-23 DIAGNOSIS — L821 Other seborrheic keratosis: Secondary | ICD-10-CM | POA: Diagnosis not present

## 2019-03-23 DIAGNOSIS — L820 Inflamed seborrheic keratosis: Secondary | ICD-10-CM | POA: Diagnosis not present

## 2019-03-23 DIAGNOSIS — L814 Other melanin hyperpigmentation: Secondary | ICD-10-CM | POA: Diagnosis not present

## 2019-03-24 DIAGNOSIS — R69 Illness, unspecified: Secondary | ICD-10-CM | POA: Diagnosis not present

## 2019-04-02 DIAGNOSIS — G4733 Obstructive sleep apnea (adult) (pediatric): Secondary | ICD-10-CM | POA: Diagnosis not present

## 2019-04-08 DIAGNOSIS — I2510 Atherosclerotic heart disease of native coronary artery without angina pectoris: Secondary | ICD-10-CM | POA: Diagnosis not present

## 2019-04-08 DIAGNOSIS — I252 Old myocardial infarction: Secondary | ICD-10-CM | POA: Diagnosis not present

## 2019-04-08 DIAGNOSIS — Z853 Personal history of malignant neoplasm of breast: Secondary | ICD-10-CM | POA: Diagnosis not present

## 2019-04-08 DIAGNOSIS — I480 Paroxysmal atrial fibrillation: Secondary | ICD-10-CM | POA: Diagnosis not present

## 2019-04-08 DIAGNOSIS — E7800 Pure hypercholesterolemia, unspecified: Secondary | ICD-10-CM | POA: Diagnosis not present

## 2019-04-08 DIAGNOSIS — E039 Hypothyroidism, unspecified: Secondary | ICD-10-CM | POA: Diagnosis not present

## 2019-04-14 DIAGNOSIS — I2510 Atherosclerotic heart disease of native coronary artery without angina pectoris: Secondary | ICD-10-CM | POA: Diagnosis not present

## 2019-04-14 DIAGNOSIS — I252 Old myocardial infarction: Secondary | ICD-10-CM | POA: Diagnosis not present

## 2019-04-14 DIAGNOSIS — Z853 Personal history of malignant neoplasm of breast: Secondary | ICD-10-CM | POA: Diagnosis not present

## 2019-04-14 DIAGNOSIS — I480 Paroxysmal atrial fibrillation: Secondary | ICD-10-CM | POA: Diagnosis not present

## 2019-04-14 DIAGNOSIS — E7800 Pure hypercholesterolemia, unspecified: Secondary | ICD-10-CM | POA: Diagnosis not present

## 2019-04-14 DIAGNOSIS — E039 Hypothyroidism, unspecified: Secondary | ICD-10-CM | POA: Diagnosis not present

## 2019-04-19 ENCOUNTER — Other Ambulatory Visit: Payer: Self-pay | Admitting: Obstetrics & Gynecology

## 2019-04-19 NOTE — Telephone Encounter (Signed)
Medication refill request: Synthroid Last AEX:  03-12-18 SM  Next AEX: 07-20-19  Last MMG (if hormonal medication request): 02-11-2019 density C/BRIADS 1 negative  Refill authorized: Today, please advise.   Medication pended for #90, 0RF. Please refill if appropriate.

## 2019-05-03 DIAGNOSIS — G4733 Obstructive sleep apnea (adult) (pediatric): Secondary | ICD-10-CM | POA: Diagnosis not present

## 2019-05-21 ENCOUNTER — Other Ambulatory Visit: Payer: Self-pay | Admitting: Interventional Cardiology

## 2019-05-21 DIAGNOSIS — I4821 Permanent atrial fibrillation: Secondary | ICD-10-CM

## 2019-05-21 DIAGNOSIS — E782 Mixed hyperlipidemia: Secondary | ICD-10-CM

## 2019-05-21 DIAGNOSIS — M818 Other osteoporosis without current pathological fracture: Secondary | ICD-10-CM

## 2019-05-21 NOTE — Telephone Encounter (Signed)
Prescription refill request for Eliquis received.  Last office visit: 12/29/2018, smith Scr: 0.83, 09/29/2018 Age: 68 y.o. Weight: 70.3 kg  Prescription refill sent.

## 2019-05-26 DIAGNOSIS — I252 Old myocardial infarction: Secondary | ICD-10-CM | POA: Diagnosis not present

## 2019-05-26 DIAGNOSIS — E7800 Pure hypercholesterolemia, unspecified: Secondary | ICD-10-CM | POA: Diagnosis not present

## 2019-05-26 DIAGNOSIS — I2510 Atherosclerotic heart disease of native coronary artery without angina pectoris: Secondary | ICD-10-CM | POA: Diagnosis not present

## 2019-05-26 DIAGNOSIS — E039 Hypothyroidism, unspecified: Secondary | ICD-10-CM | POA: Diagnosis not present

## 2019-05-26 DIAGNOSIS — I480 Paroxysmal atrial fibrillation: Secondary | ICD-10-CM | POA: Diagnosis not present

## 2019-05-26 DIAGNOSIS — Z853 Personal history of malignant neoplasm of breast: Secondary | ICD-10-CM | POA: Diagnosis not present

## 2019-06-02 DIAGNOSIS — G4733 Obstructive sleep apnea (adult) (pediatric): Secondary | ICD-10-CM | POA: Diagnosis not present

## 2019-06-05 DIAGNOSIS — G4733 Obstructive sleep apnea (adult) (pediatric): Secondary | ICD-10-CM | POA: Diagnosis not present

## 2019-06-09 ENCOUNTER — Other Ambulatory Visit: Payer: Self-pay | Admitting: Interventional Cardiology

## 2019-06-09 DIAGNOSIS — E559 Vitamin D deficiency, unspecified: Secondary | ICD-10-CM

## 2019-06-09 DIAGNOSIS — C50912 Malignant neoplasm of unspecified site of left female breast: Secondary | ICD-10-CM

## 2019-07-03 DIAGNOSIS — G4733 Obstructive sleep apnea (adult) (pediatric): Secondary | ICD-10-CM | POA: Diagnosis not present

## 2019-07-16 ENCOUNTER — Other Ambulatory Visit: Payer: Self-pay

## 2019-07-20 ENCOUNTER — Encounter: Payer: Self-pay | Admitting: Obstetrics & Gynecology

## 2019-07-20 ENCOUNTER — Ambulatory Visit (INDEPENDENT_AMBULATORY_CARE_PROVIDER_SITE_OTHER): Payer: Medicare HMO | Admitting: Obstetrics & Gynecology

## 2019-07-20 ENCOUNTER — Other Ambulatory Visit: Payer: Self-pay

## 2019-07-20 VITALS — BP 108/62 | HR 68 | Temp 97.0°F | Resp 10 | Ht 64.5 in | Wt 157.0 lb

## 2019-07-20 DIAGNOSIS — Z01419 Encounter for gynecological examination (general) (routine) without abnormal findings: Secondary | ICD-10-CM | POA: Diagnosis not present

## 2019-07-20 DIAGNOSIS — E031 Congenital hypothyroidism without goiter: Secondary | ICD-10-CM

## 2019-07-20 DIAGNOSIS — M8580 Other specified disorders of bone density and structure, unspecified site: Secondary | ICD-10-CM | POA: Diagnosis not present

## 2019-07-20 DIAGNOSIS — R140 Abdominal distension (gaseous): Secondary | ICD-10-CM

## 2019-07-20 MED ORDER — LEVOTHYROXINE SODIUM 50 MCG PO TABS: ORAL_TABLET | ORAL | 4 refills | Status: DC

## 2019-07-20 NOTE — Progress Notes (Signed)
69 y.o. G55P0040 Married White or Caucasian female here for annual exam.  Doing well.  Denies vaginal bleeding.  Has not been able to get Covid vaccination yet.    Complaints of abdominal bloating the last two months.  Changes statins so wonders if this is related.  Patient's last menstrual period was 11/08/2001 (approximate).          Sexually active: Yes.    The current method of family planning is post menopausal status.    Exercising: Yes.    yoga and walking Smoker:  no  Health Maintenance: Pap:   03/12/18 Neg  01/13/15 Neg. HR HPV:neg  History of abnormal Pap:  no MMG:  02/11/19 BIRADS 1 negative/density c Colonoscopy:  12/24/17 f/u 5 years BMD:   02/12/19 normal  TDaP:  10/09/10 Pneumonia vaccine(s):  completed Shingrix:   completed Hep C testing: 02/03/17 Negative Screening Labs: discuss today   reports that she has never smoked. She has never used smokeless tobacco. She reports current alcohol use of about 7.0 standard drinks of alcohol per week. She reports that she does not use drugs.  Past Medical History:  Diagnosis Date  . Adenocarcinoma of breast ([place]) 06/03   RIGHT  . Arthritis   . Chronic anticoagulation    Eliquis  . Diverticulitis 2009   diverticulitis  . Heart attack ([place])   . HOH (hard of hearing)    right ear; wears hearing aid  . Hypertension   . Infertility, female 09/01   4 SAB  . Metrorrhagia   . Migraine 09/01  . Permanent atrial fibrillation ([place]) 04/01/2014  . Personal history of chemotherapy 2003  . Personal history of radiation therapy 2003  . STD (sexually transmitted disease) 1976   Hx of HSV  . Stroke ([place]) 01/03/15   s/p TPA R-MCA  . Vitamin D deficiency 07/2006    Past Surgical History:  Procedure Laterality Date  . APPENDECTOMY  1959  . BREAST SURGERY Right 11/2001   lumpectomy with sentinel node biopsy X 3 all negative, ER/PR negative , chemo 6 doses, and radiation daily X 6 weeks  . CARDIAC CATHETERIZATION N/A 01/16/2015   Procedure:  Left Heart Cath and Coronary Angiography;  Surgeon: Belva Crome, MD; OM1 75%, RCA 30%, EF normal; spasm in RCA w/ catheter engagement   . COLONOSCOPY  11/2012   diverticulitis, 2 polyps negative, repeat in 5 years  . HALLUX FUSION Left 03/03/2017   Procedure: HALLUX RIGIDUS CORRECTION WITH COLECTOMY DEBRIDEMENT AND CAPSULAR RELEASE OF THE FIRST MPJ WITH IMPLANT LEFT FOOT;  Surgeon: Rosemary Holms, DPM;  Location: [facility];  Service: Podiatry;  Laterality: Left;  . JOINT REPLACEMENT Left 02/2017   big toe  . PORT-A-CATH REMOVAL    . RADIOLOGY WITH ANESTHESIA N/A 01/03/2015   Procedure: RADIOLOGY WITH ANESTHESIA;  Surgeon: Medication Radiologist, MD;  Location: [facility];  Service: Radiology;  Laterality: N/A;    Current Outpatient Medications  Medication Sig Dispense Refill  . acetaminophen (TYLENOL) 500 MG tablet Take 1,000 mg by mouth every 6 (six) hours as needed for moderate pain or headache. Reported on 09/05/2015    . atorvastatin (LIPITOR) 40 MG tablet Take 1 tablet (40 mg total) by mouth daily. 90 tablet 3  . Biotin w/ Vitamins C & E (HAIR/SKIN/NAILS PO) Take by mouth.    Arne Cleveland 5 MG TABS tablet TAKE ONE TABLET BY MOUTH TWICE A DAY 60 tablet 5  . Ergocalciferol (VITAMIN D2) 2000 units TABS Take 2,000  Units by mouth daily.    Marland Kitchen levothyroxine (SYNTHROID) 50 MCG tablet TAKE 1 TABLET BY MOUTH DAILY BEFORE BREAKFAST 90 tablet 0  . metoprolol succinate (TOPROL-XL) 50 MG 24 hr tablet TAKE ONE TABLET BY MOUTH DAILY 90 tablet 1  . nitroGLYCERIN (NITROSTAT) 0.4 MG SL tablet Place 1 tablet (0.4 mg total) under the tongue every 5 (five) minutes as needed for chest pain. 25 tablet 3   No current facility-administered medications for this visit.    Family History  Problem Relation Age of Onset  . Colon cancer Mother   . Cervical cancer Mother   . Osteoporosis Mother   . Cancer Mother   . Heart disease Father   . Heart disease Brother   . Breast cancer Maternal Aunt   .  Cancer Maternal Aunt   . Multiple births Maternal Grandmother   . Multiple births Paternal Grandmother     Review of Systems  All other systems reviewed and are negative.   Exam:   BP 108/62 (BP Location: Left Arm, Patient Position: Sitting, Cuff Size: Normal)   Pulse 68   Temp (!) 97 F (36.1 C) (Temporal)   Resp 10   Ht 5' 4.5" (1.638 m)   Wt 157 lb (71.2 kg)   LMP 11/08/2001 (Approximate)   BMI 26.53 kg/m     Height: 5' 4.5" (163.8 cm)  Ht Readings from Last 3 Encounters:  07/20/19 5' 4.5" (1.638 m)  12/29/18 5' 4.75" (1.645 m)  03/12/18 5' 4.75" (1.645 m)    General appearance: alert, cooperative and appears stated age Head: Normocephalic, without obvious abnormality, atraumatic Neck: no adenopathy, supple, symmetrical, trachea midline and thyroid normal to inspection and palpation Lungs: clear to auscultation bilaterally Breasts: normal appearance, no masses or tenderness, well healed right breast scar Heart: regular rate and rhythm Abdomen: soft, non-tender; bowel sounds normal; no masses,  no organomegaly Extremities: extremities normal, atraumatic, no cyanosis or edema Skin: Skin color, texture, turgor normal. No rashes or lesions Lymph nodes: Cervical, supraclavicular, and axillary nodes normal. No abnormal inguinal nodes palpated Neurologic: Grossly normal   Pelvic: External genitalia:  no lesions              Urethra:  normal appearing urethra with no masses, tenderness or lesions              Bartholins and Skenes: normal                 Vagina: normal appearing vagina with normal color and discharge, no lesions              Cervix: no lesions              Pap taken: No. Bimanual Exam:  Uterus:  normal size, contour, position, consistency, mobility, non-tender              Adnexa: normal adnexa and no mass, fullness, tenderness               Rectovaginal: Confirms               Anus:  normal sphincter tone, no lesions  Chaperone, Terence Lux, CMA,  was present for exam.  A:  Well Woman with normal exam PMP, no HRT H/o Stage 2A triple negative breast cancer 6/03.  S/p lumpectomy, chemo and radidation Hypothyroidism H/o MI and CVA 8/16 Abdominal bloating  P:   Mammogram guidelines reviewed.  Doing yearly. pap smear neg 10/18, not indicated today Vit D and TSH  obtained today RF synthroid 11mcg daily.  #90/4RF Return for PUS.  Order placed. Return annually or prn

## 2019-07-21 LAB — TSH: TSH: 2.58 u[IU]/mL (ref 0.450–4.500)

## 2019-07-21 LAB — VITAMIN D 25 HYDROXY (VIT D DEFICIENCY, FRACTURES): Vit D, 25-Hydroxy: 68 ng/mL (ref 30.0–100.0)

## 2019-07-27 DIAGNOSIS — I480 Paroxysmal atrial fibrillation: Secondary | ICD-10-CM | POA: Diagnosis not present

## 2019-07-27 DIAGNOSIS — E7800 Pure hypercholesterolemia, unspecified: Secondary | ICD-10-CM | POA: Diagnosis not present

## 2019-07-27 DIAGNOSIS — I252 Old myocardial infarction: Secondary | ICD-10-CM | POA: Diagnosis not present

## 2019-07-27 DIAGNOSIS — Z853 Personal history of malignant neoplasm of breast: Secondary | ICD-10-CM | POA: Diagnosis not present

## 2019-07-27 DIAGNOSIS — E039 Hypothyroidism, unspecified: Secondary | ICD-10-CM | POA: Diagnosis not present

## 2019-07-27 DIAGNOSIS — I2510 Atherosclerotic heart disease of native coronary artery without angina pectoris: Secondary | ICD-10-CM | POA: Diagnosis not present

## 2019-07-29 ENCOUNTER — Other Ambulatory Visit: Payer: Medicare HMO

## 2019-07-29 ENCOUNTER — Other Ambulatory Visit: Payer: Medicare HMO | Admitting: Obstetrics & Gynecology

## 2019-08-03 DIAGNOSIS — G4733 Obstructive sleep apnea (adult) (pediatric): Secondary | ICD-10-CM | POA: Diagnosis not present

## 2019-08-04 ENCOUNTER — Other Ambulatory Visit: Payer: Self-pay

## 2019-08-05 ENCOUNTER — Ambulatory Visit (INDEPENDENT_AMBULATORY_CARE_PROVIDER_SITE_OTHER): Payer: Medicare HMO

## 2019-08-05 ENCOUNTER — Encounter: Payer: Self-pay | Admitting: Obstetrics & Gynecology

## 2019-08-05 ENCOUNTER — Other Ambulatory Visit: Payer: Medicare HMO

## 2019-08-05 ENCOUNTER — Ambulatory Visit: Payer: Medicare HMO | Admitting: Obstetrics & Gynecology

## 2019-08-05 VITALS — BP 116/60 | HR 76 | Temp 96.8°F | Resp 10 | Ht 64.5 in | Wt 157.4 lb

## 2019-08-05 DIAGNOSIS — D251 Intramural leiomyoma of uterus: Secondary | ICD-10-CM | POA: Diagnosis not present

## 2019-08-05 DIAGNOSIS — R140 Abdominal distension (gaseous): Secondary | ICD-10-CM

## 2019-08-05 DIAGNOSIS — N859 Noninflammatory disorder of uterus, unspecified: Secondary | ICD-10-CM

## 2019-08-05 NOTE — Progress Notes (Signed)
69 y.o. G63P0040 Married White or Caucasian female here for pelvic ultrasound due to abdominal bloating that she described to me at her [facility].  Denies vaginal bleeding.  She has recently started on statins, so she has questioned whether this was related  Patient's last menstrual period was 11/08/2001 (approximate).  Contraception: menopause  Ultrasonography supervised.  Findings are as below:  UTERUS: 4.4 x. 3.8 x 2.6cm with 1.5 x 1.2cm fibroid noted EMS: 1.36mm, sliver of fluid noted ADNEXA: Left ovary:  1.0 x 0.7 x 0.5cm       Right ovary:  0.8 x 0.6 x 0.6cm CUL DE SAC: no free fluid  Discussion:  Pictures reviewed with pt and above findings discussed.  Pt recalls hx of abnormal ultrasound in the past with possible fibroid or possible ovary being followed.  This was when she saw Kem Boroughs, NP.  There are no prior documented ultrasounds in Epic.  Will request her paper chart for comparison and communicate with pt after review of older notes in paper chart.    Ovary findings discussed.  Pt is aware that she has very hyperactive bowels noted with ultrasound today.  This could be indicative of IBS.  She and I discussed this could be contributing to some of the symptoms she is experiencing.  We discussed treatment with probiotic for a month.  Recommended Align.  If this does not help and symptoms are not improved, she may want to discuss stopping her statin for a few weeks to see if this is related.  Deciding to do this should be up to how severe she feels her symptoms are.    Lastly, there is a sliver of fluid within the endometrial cavity.  As the endometrium is thin, this incidental findings does not need dedicated follow up.  All questions answered.  Assessment:  Uterine fibroid Bloating possibly due to hyperactive bowel movement Normal atrophic ovaries on ultrasound Sliver of endometrial fluid noted today on exam  Plan:  Paper chart will be obtained to compare prior ultrasound to current  ultrasound.  She will be called with results Probiotic suggested for one month.  She will consider using Align.  If she does, she will let me know how she feels after taking this.  Results and images reviewed with pt.  Additional 20 minutes spent in discussion of possible treatment considerations and possible additional testing that may be needed depending on findings in paper chart which was requested today.

## 2019-08-25 DIAGNOSIS — R040 Epistaxis: Secondary | ICD-10-CM | POA: Diagnosis not present

## 2019-08-25 DIAGNOSIS — J3089 Other allergic rhinitis: Secondary | ICD-10-CM | POA: Diagnosis not present

## 2019-08-31 DIAGNOSIS — G4733 Obstructive sleep apnea (adult) (pediatric): Secondary | ICD-10-CM | POA: Diagnosis not present

## 2019-09-03 ENCOUNTER — Telehealth: Payer: Self-pay | Admitting: Obstetrics & Gynecology

## 2019-09-03 NOTE — Telephone Encounter (Signed)
Paperchart obtained to review if pt has fibroid on prior ultrasound.  Ultrasound in 2009 showed 1.2cm intramural fibroid.  Ultrasound in early March showed 1.4cm fibroid.  Called to let her know this and that no additional follow up was needed for this at this time.  No answer.  DPR reviewed.  Detailed message left for pt.

## 2019-09-06 DIAGNOSIS — G4733 Obstructive sleep apnea (adult) (pediatric): Secondary | ICD-10-CM | POA: Diagnosis not present

## 2019-09-07 ENCOUNTER — Encounter: Payer: Self-pay | Admitting: Obstetrics & Gynecology

## 2019-09-15 DIAGNOSIS — R69 Illness, unspecified: Secondary | ICD-10-CM | POA: Diagnosis not present

## 2019-10-05 DIAGNOSIS — G4733 Obstructive sleep apnea (adult) (pediatric): Secondary | ICD-10-CM | POA: Diagnosis not present

## 2019-10-06 DIAGNOSIS — G4733 Obstructive sleep apnea (adult) (pediatric): Secondary | ICD-10-CM | POA: Diagnosis not present

## 2019-10-06 DIAGNOSIS — Z853 Personal history of malignant neoplasm of breast: Secondary | ICD-10-CM | POA: Diagnosis not present

## 2019-10-06 DIAGNOSIS — Z8673 Personal history of transient ischemic attack (TIA), and cerebral infarction without residual deficits: Secondary | ICD-10-CM | POA: Diagnosis not present

## 2019-10-06 DIAGNOSIS — I252 Old myocardial infarction: Secondary | ICD-10-CM | POA: Diagnosis not present

## 2019-10-06 DIAGNOSIS — E039 Hypothyroidism, unspecified: Secondary | ICD-10-CM | POA: Diagnosis not present

## 2019-10-06 DIAGNOSIS — Z6825 Body mass index (BMI) 25.0-25.9, adult: Secondary | ICD-10-CM | POA: Diagnosis not present

## 2019-10-06 DIAGNOSIS — Z86010 Personal history of colonic polyps: Secondary | ICD-10-CM | POA: Diagnosis not present

## 2019-10-06 DIAGNOSIS — E7800 Pure hypercholesterolemia, unspecified: Secondary | ICD-10-CM | POA: Diagnosis not present

## 2019-10-06 DIAGNOSIS — I480 Paroxysmal atrial fibrillation: Secondary | ICD-10-CM | POA: Diagnosis not present

## 2019-10-06 DIAGNOSIS — I2510 Atherosclerotic heart disease of native coronary artery without angina pectoris: Secondary | ICD-10-CM | POA: Diagnosis not present

## 2019-10-06 DIAGNOSIS — Z0000 Encounter for general adult medical examination without abnormal findings: Secondary | ICD-10-CM | POA: Diagnosis not present

## 2019-10-15 ENCOUNTER — Other Ambulatory Visit: Payer: Self-pay | Admitting: Interventional Cardiology

## 2019-10-25 ENCOUNTER — Telehealth: Payer: Self-pay | Admitting: Interventional Cardiology

## 2019-10-25 DIAGNOSIS — Z79899 Other long term (current) drug therapy: Secondary | ICD-10-CM

## 2019-10-25 DIAGNOSIS — E782 Mixed hyperlipidemia: Secondary | ICD-10-CM

## 2019-10-25 DIAGNOSIS — Z7901 Long term (current) use of anticoagulants: Secondary | ICD-10-CM

## 2019-10-25 NOTE — Telephone Encounter (Signed)
New Message   Patient would like to have lab order put into the system so that she can have her labs done before her next office visit with Dr. Tamala Julian.

## 2019-10-25 NOTE — Telephone Encounter (Signed)
Dr. Tamala Julian, the pt is calling to inquire if she can get labs done prior to seeing you in clinic on 12/30/19.  Pt is requesting lab orders to be placed and a lab appt to be made, prior to seeing you.  Please advise if you want her to have labs done prior to 7/22 OV, and which labs you would like on the pt? Please advise!

## 2019-10-26 NOTE — Telephone Encounter (Signed)
Left message to call office

## 2019-10-26 NOTE — Telephone Encounter (Signed)
CBC, CMET,and lipid panel.

## 2019-10-27 NOTE — Telephone Encounter (Signed)
Pt advised of blood work Dr. Tamala Julian recommends, scheduled for 7/19 per pt request. Aware to be fasting for blood work. Patient verbalized understanding and agreeable to plan.

## 2019-10-27 NOTE — Telephone Encounter (Signed)
LMTCB

## 2019-10-27 NOTE — Telephone Encounter (Signed)
Patient returned call

## 2019-11-11 DIAGNOSIS — J300 Vasomotor rhinitis: Secondary | ICD-10-CM | POA: Diagnosis not present

## 2019-11-15 ENCOUNTER — Other Ambulatory Visit: Payer: Self-pay | Admitting: Interventional Cardiology

## 2019-11-15 DIAGNOSIS — I4821 Permanent atrial fibrillation: Secondary | ICD-10-CM

## 2019-11-15 DIAGNOSIS — M818 Other osteoporosis without current pathological fracture: Secondary | ICD-10-CM

## 2019-11-15 DIAGNOSIS — E782 Mixed hyperlipidemia: Secondary | ICD-10-CM

## 2019-11-15 NOTE — Telephone Encounter (Signed)
Prescription refill request for Eliquis received.  Last office visit: Rhonda Paul 12/29/2018 Scr: 0.83, 09/29/2018 Age: 69 y.o. Weight: 71.4 kg   Pt is overdue for blood work. Pt is scheduled to get blood work in July. Will send refill so pt has enough Eliquis to last her to that appointment.

## 2019-12-07 DIAGNOSIS — I480 Paroxysmal atrial fibrillation: Secondary | ICD-10-CM | POA: Diagnosis not present

## 2019-12-07 DIAGNOSIS — I2510 Atherosclerotic heart disease of native coronary artery without angina pectoris: Secondary | ICD-10-CM | POA: Diagnosis not present

## 2019-12-07 DIAGNOSIS — E039 Hypothyroidism, unspecified: Secondary | ICD-10-CM | POA: Diagnosis not present

## 2019-12-07 DIAGNOSIS — Z853 Personal history of malignant neoplasm of breast: Secondary | ICD-10-CM | POA: Diagnosis not present

## 2019-12-07 DIAGNOSIS — E7800 Pure hypercholesterolemia, unspecified: Secondary | ICD-10-CM | POA: Diagnosis not present

## 2019-12-07 DIAGNOSIS — I252 Old myocardial infarction: Secondary | ICD-10-CM | POA: Diagnosis not present

## 2019-12-09 DIAGNOSIS — G4733 Obstructive sleep apnea (adult) (pediatric): Secondary | ICD-10-CM | POA: Diagnosis not present

## 2019-12-14 ENCOUNTER — Other Ambulatory Visit: Payer: Self-pay | Admitting: Interventional Cardiology

## 2019-12-27 ENCOUNTER — Other Ambulatory Visit: Payer: Medicare HMO | Admitting: *Deleted

## 2019-12-27 ENCOUNTER — Other Ambulatory Visit: Payer: Self-pay

## 2019-12-27 DIAGNOSIS — Z853 Personal history of malignant neoplasm of breast: Secondary | ICD-10-CM | POA: Diagnosis not present

## 2019-12-27 DIAGNOSIS — E782 Mixed hyperlipidemia: Secondary | ICD-10-CM | POA: Diagnosis not present

## 2019-12-27 DIAGNOSIS — I252 Old myocardial infarction: Secondary | ICD-10-CM | POA: Diagnosis not present

## 2019-12-27 DIAGNOSIS — I480 Paroxysmal atrial fibrillation: Secondary | ICD-10-CM | POA: Diagnosis not present

## 2019-12-27 DIAGNOSIS — Z79899 Other long term (current) drug therapy: Secondary | ICD-10-CM

## 2019-12-27 DIAGNOSIS — Z7901 Long term (current) use of anticoagulants: Secondary | ICD-10-CM

## 2019-12-27 DIAGNOSIS — E7800 Pure hypercholesterolemia, unspecified: Secondary | ICD-10-CM | POA: Diagnosis not present

## 2019-12-27 DIAGNOSIS — I2510 Atherosclerotic heart disease of native coronary artery without angina pectoris: Secondary | ICD-10-CM | POA: Diagnosis not present

## 2019-12-27 DIAGNOSIS — E039 Hypothyroidism, unspecified: Secondary | ICD-10-CM | POA: Diagnosis not present

## 2019-12-27 LAB — CBC
Hematocrit: 39.3 % (ref 34.0–46.6)
Hemoglobin: 13.1 g/dL (ref 11.1–15.9)
MCH: 33.2 pg — ABNORMAL HIGH (ref 26.6–33.0)
MCHC: 33.3 g/dL (ref 31.5–35.7)
MCV: 100 fL — ABNORMAL HIGH (ref 79–97)
Platelets: 241 10*3/uL (ref 150–450)
RBC: 3.94 x10E6/uL (ref 3.77–5.28)
RDW: 12.1 % (ref 11.7–15.4)
WBC: 6.8 10*3/uL (ref 3.4–10.8)

## 2019-12-27 LAB — COMPREHENSIVE METABOLIC PANEL
ALT: 41 IU/L — ABNORMAL HIGH (ref 0–32)
AST: 37 IU/L (ref 0–40)
Albumin/Globulin Ratio: 1.5 (ref 1.2–2.2)
Albumin: 4.2 g/dL (ref 3.8–4.8)
Alkaline Phosphatase: 64 IU/L (ref 48–121)
BUN/Creatinine Ratio: 13 (ref 12–28)
BUN: 11 mg/dL (ref 8–27)
Bilirubin Total: 0.6 mg/dL (ref 0.0–1.2)
CO2: 27 mmol/L (ref 20–29)
Calcium: 9.3 mg/dL (ref 8.7–10.3)
Chloride: 101 mmol/L (ref 96–106)
Creatinine, Ser: 0.85 mg/dL (ref 0.57–1.00)
GFR calc Af Amer: 82 mL/min/{1.73_m2} (ref >59)
GFR calc non Af Amer: 71 mL/min/{1.73_m2} (ref >59)
Globulin, Total: 2.8 g/dL (ref 1.5–4.5)
Glucose: 107 mg/dL — ABNORMAL HIGH (ref 65–99)
Potassium: 4.9 mmol/L (ref 3.5–5.2)
Sodium: 140 mmol/L (ref 134–144)
Total Protein: 7 g/dL (ref 6.0–8.5)

## 2019-12-27 LAB — LIPID PANEL
Chol/HDL Ratio: 2.6 ratio (ref 0.0–4.4)
Cholesterol, Total: 178 mg/dL (ref 100–199)
HDL: 69 mg/dL (ref >39)
LDL Chol Calc (NIH): 83 mg/dL (ref 0–99)
Triglycerides: 150 mg/dL — ABNORMAL HIGH (ref 0–149)
VLDL Cholesterol Cal: 26 mg/dL (ref 5–40)

## 2019-12-29 ENCOUNTER — Other Ambulatory Visit: Payer: Self-pay | Admitting: Obstetrics & Gynecology

## 2019-12-29 DIAGNOSIS — Z1231 Encounter for screening mammogram for malignant neoplasm of breast: Secondary | ICD-10-CM

## 2019-12-29 NOTE — Progress Notes (Signed)
Cardiology Office Note:    Date:  12/30/2019   ID:  SYNA GAD, DOB [birth year], MRN [id number]  PCP:  Kathyrn Lass, MD  Cardiologist:  Sinclair Grooms, MD   Referring MD: Kathyrn Lass, MD   Chief Complaint  Patient presents with  . Coronary Artery Disease  . Atrial Fibrillation  . Hyperlipidemia  . Hypertension    History of Present Illness:    Rhonda Paul is a 69 y.o. female with a hx of CAD with nonobstructive CAD by cath 2016 (60 to 70% obtuse marginal and 82% RCA), embolic [id number], permanent atrial fibrillation, non-ST elevation myocardial infarction due to plaque rupture, and chronic anticoagulation therapy.  Since the last office visit there have been no major cardiac issues.  She voices compliance with her medical regimen.  There have been no recurrent neurological symptoms.  She is concerned about the possibility of future stroke.  She has absolutely no complaints.  She is compliant with medications.  She is having bleeding from her left nostril intermittently.  Past Medical History:  Diagnosis Date  . Adenocarcinoma of breast ([place]) 06/03   RIGHT  . Arthritis   . Chronic anticoagulation    Eliquis  . Diverticulitis 2009   diverticulitis  . Heart attack ([place])   . HOH (hard of hearing)    right ear; wears hearing aid  . Hypertension   . Infertility, female 09/01   4 SAB  . Metrorrhagia   . Migraine 09/01  . Permanent atrial fibrillation ([place]) 04/01/2014  . Personal history of chemotherapy 2003  . Personal history of radiation therapy 2003  . STD (sexually transmitted disease) 1976   Hx of HSV  . Stroke ([place]) 01/03/15   s/p TPA R-MCA  . Vitamin D deficiency 07/2006    Past Surgical History:  Procedure Laterality Date  . APPENDECTOMY  1959  . BREAST SURGERY Right 11/2001   lumpectomy with sentinel node biopsy X 3 all negative, ER/PR negative , chemo 6 doses, and radiation daily X 6 weeks  . CARDIAC CATHETERIZATION N/A 01/16/2015   Procedure:  Left Heart Cath and Coronary Angiography;  Surgeon: Belva Crome, MD; OM1 75%, RCA 30%, EF normal; spasm in RCA w/ catheter engagement   . COLONOSCOPY  11/2012   diverticulitis, 2 polyps negative, repeat in 5 years  . HALLUX FUSION Left 03/03/2017   Procedure: HALLUX RIGIDUS CORRECTION WITH COLECTOMY DEBRIDEMENT AND CAPSULAR RELEASE OF THE FIRST MPJ WITH IMPLANT LEFT FOOT;  Surgeon: Rosemary Holms, DPM;  Location: [facility];  Service: Podiatry;  Laterality: Left;  . JOINT REPLACEMENT Left 02/2017   big toe  . PORT-A-CATH REMOVAL    . RADIOLOGY WITH ANESTHESIA N/A 01/03/2015   Procedure: RADIOLOGY WITH ANESTHESIA;  Surgeon: Medication Radiologist, MD;  Location: [facility];  Service: Radiology;  Laterality: N/A;    Current Medications: No outpatient medications have been marked as taking for the 12/30/19 encounter (Office Visit) with Belva Crome, MD.     Allergies:   Lambs quarters, Lanolin acid [lanolin], and Other   Social History   Socioeconomic History  . Marital status: Married    Spouse name: Not on file  . Number of children: Not on file  . Years of education: Not on file  . Highest education level: Not on file  Occupational History  . Not on file  Tobacco Use  . Smoking status: Never Smoker  . Smokeless tobacco: Never Used  . Tobacco comment: never used tobacco  Vaping Use  . Vaping Use: Never used  Substance and Sexual Activity  . Alcohol use: Yes    Alcohol/week: 7.0 standard drinks    Types: 7 Glasses of wine per week  . Drug use: No  . Sexual activity: Yes    Partners: Male    Birth control/protection: Post-menopausal  Other Topics Concern  . Not on file  Social History Narrative  . Not on file   Social Determinants of Health   Financial Resource Strain:   . Difficulty of Paying Living Expenses:   Food Insecurity:   . Worried About Charity fundraiser in the Last Year:   . Arboriculturist in the Last Year:   Transportation Needs:   . Consulting civil engineer (Medical):   Marland Kitchen Lack of Transportation (Non-Medical):   Physical Activity:   . Days of Exercise per Week:   . Minutes of Exercise per Session:   Stress:   . Feeling of Stress :   Social Connections:   . Frequency of Communication with Friends and Family:   . Frequency of Social Gatherings with Friends and Family:   . Attends Religious Services:   . Active Member of Clubs or Organizations:   . Attends Archivist Meetings:   Marland Kitchen Marital Status:      Family History: The patient's family history includes Breast cancer in her maternal aunt; Cancer in her maternal aunt and mother; Cervical cancer in her mother; Colon cancer in her mother; Heart disease in her brother and father; Multiple births in her maternal grandmother and paternal grandmother; Osteoporosis in her mother.  ROS:   Please see the history of present illness.    She has seen ENT concerning left nasal bleeding.  She has been placed on steroid drops.  All other systems reviewed and are negative.  EKGs/Labs/Other Studies Reviewed:    The following studies were reviewed today: No new or recent imaging data  EKG:  EKG of July 2020 reveals coarse atrial fibrillation, low voltage, poor R wave progression with left axis deviation.  EKG performed on 12/30/2019 reveals coarse A. fib, controlled ventricular response, left axis deviation, poor R-wave wave progression, and no change compared to 2020.  Recent Labs: 07/20/2019: TSH 2.580 12/27/2019: ALT 41; BUN 11; Creatinine, Ser 0.85; Hemoglobin 13.1; Platelets 241; Potassium 4.9; Sodium 140  Recent Lipid Panel    Component Value Date/Time   CHOL 178 12/27/2019 0900   TRIG 150 (H) 12/27/2019 0900   HDL 69 12/27/2019 0900   CHOLHDL 2.6 12/27/2019 0900   CHOLHDL 2.2 09/25/2015 0959   VLDL 17 09/25/2015 0959   LDLCALC 83 12/27/2019 0900    Physical Exam:    VS:  Ht 5' 4.5" (1.638 m)   LMP 11/08/2001 (Approximate)   BMI 26.60 kg/m     Wt Readings from  Last 3 Encounters:  08/05/19 157 lb 6.4 oz (71.4 kg)  07/20/19 157 lb (71.2 kg)  12/29/18 155 lb (70.3 kg)     GEN: Alopecia. No acute distress HEENT: Normal NECK: No JVD. LYMPHATICS: No lymphadenopathy CARDIAC:  RRR without murmur, gallop, or edema. VASCULAR:  Normal Pulses. No bruits. RESPIRATORY:  Clear to auscultation without rales, wheezing or rhonchi  ABDOMEN: Soft, non-tender, non-distended, No pulsatile mass, MUSCULOSKELETAL: No deformity  SKIN: Warm and dry NEUROLOGIC:  Alert and oriented x 3 PSYCHIATRIC:  Normal affect   ASSESSMENT:    1. CAD in native artery   2. Permanent atrial fibrillation ([place])  3. Chronic anticoagulation   4. Mixed hyperlipidemia   5. Obstructive sleep apnea on CPAP   6. Educated about COVID-19 virus infection    PLAN:    In order of problems listed above:  1. Asymptomatic.  Secondary prevention discussed 2. No change.  Protected with anticoagulation. 3. Eliquis should be continued despite recurring episodes of left nasal bleeding.  She has seen ENT.  She has a deviated septum.  This no growth or concern about malignancy.  Continue anticoagulation.  She asked me about left atrial occluder devices.  We talked about our experience with watchman. 4. LDL should be 70 or less.  Most recent was 93.  We decided not to increase intensity of Lipitor but to leave it at 40 mg/day and she will supplement walking and low animal fat diet. 5. Continue CPAP. 6. She has been vaccinated.  She did not get COVID-19.  She is worried about the variant.  Overall education and awareness concerning primary/secondary risk prevention was discussed in detail: LDL less than 70, hemoglobin A1c less than 7, blood pressure target less than 130/80 mmHg, >150 minutes of moderate aerobic activity per week, avoidance of smoking, weight control (via diet and exercise), and continued surveillance/management of/for obstructive sleep apnea.    Medication Adjustments/Labs and  Tests Ordered: Current medicines are reviewed at length with the patient today.  Concerns regarding medicines are outlined above.  No orders of the defined types were placed in this encounter.  No orders of the defined types were placed in this encounter.   There are no Patient Instructions on file for this visit.   Signed, Sinclair Grooms, MD  12/30/2019 9:02 AM    [place]

## 2019-12-30 ENCOUNTER — Encounter: Payer: Self-pay | Admitting: Interventional Cardiology

## 2019-12-30 ENCOUNTER — Ambulatory Visit: Payer: Medicare HMO | Admitting: Interventional Cardiology

## 2019-12-30 ENCOUNTER — Other Ambulatory Visit: Payer: Self-pay

## 2019-12-30 VITALS — BP 110/70 | HR 62 | Ht 64.5 in | Wt 159.6 lb

## 2019-12-30 DIAGNOSIS — I4821 Permanent atrial fibrillation: Secondary | ICD-10-CM

## 2019-12-30 DIAGNOSIS — Z7901 Long term (current) use of anticoagulants: Secondary | ICD-10-CM

## 2019-12-30 DIAGNOSIS — G4733 Obstructive sleep apnea (adult) (pediatric): Secondary | ICD-10-CM

## 2019-12-30 DIAGNOSIS — I2510 Atherosclerotic heart disease of native coronary artery without angina pectoris: Secondary | ICD-10-CM

## 2019-12-30 DIAGNOSIS — Z7189 Other specified counseling: Secondary | ICD-10-CM | POA: Diagnosis not present

## 2019-12-30 DIAGNOSIS — Z9989 Dependence on other enabling machines and devices: Secondary | ICD-10-CM | POA: Diagnosis not present

## 2019-12-30 DIAGNOSIS — E782 Mixed hyperlipidemia: Secondary | ICD-10-CM | POA: Diagnosis not present

## 2019-12-30 NOTE — Patient Instructions (Addendum)
Medication Instructions:  Your physician recommends that you continue on your current medications as directed. Please refer to the Current Medication list given to you today.  *If you need a refill on your cardiac medications before your next appointment, please call your pharmacy*   Lab Work: None If you have labs (blood work) drawn today and your tests are completely normal, you will receive your results only by: Marland Kitchen MyChart Message (if you have MyChart) OR . A paper copy in the mail If you have any lab test that is abnormal or we need to change your treatment, we will call you to review the results.   Testing/Procedures: None   Follow-Up: At [facility], you and your health needs are our priority.  As part of our continuing mission to provide you with exceptional heart care, we have created designated Provider Care Teams.  These Care Teams include your primary Cardiologist (physician) and Advanced Practice Providers (APPs -  Physician Assistants and Nurse Practitioners) who all work together to provide you with the care you need, when you need it.    Your next appointment:   1 year(s)  The format for your next appointment:   In Person  Provider:   You may see Sinclair Grooms, MD or one of the following Advanced Practice Providers on your designated Care Team:    Truitt Merle, NP  Cecilie Kicks, NP  Kathyrn Drown, NP

## 2020-01-12 DIAGNOSIS — R040 Epistaxis: Secondary | ICD-10-CM | POA: Diagnosis not present

## 2020-01-12 DIAGNOSIS — Z7901 Long term (current) use of anticoagulants: Secondary | ICD-10-CM | POA: Diagnosis not present

## 2020-02-08 DIAGNOSIS — J342 Deviated nasal septum: Secondary | ICD-10-CM | POA: Diagnosis not present

## 2020-02-08 DIAGNOSIS — R040 Epistaxis: Secondary | ICD-10-CM | POA: Diagnosis not present

## 2020-02-12 ENCOUNTER — Other Ambulatory Visit: Payer: Self-pay | Admitting: Interventional Cardiology

## 2020-02-12 DIAGNOSIS — I4821 Permanent atrial fibrillation: Secondary | ICD-10-CM

## 2020-02-12 DIAGNOSIS — E782 Mixed hyperlipidemia: Secondary | ICD-10-CM

## 2020-02-12 DIAGNOSIS — M818 Other osteoporosis without current pathological fracture: Secondary | ICD-10-CM

## 2020-02-15 ENCOUNTER — Other Ambulatory Visit: Payer: Self-pay

## 2020-02-15 ENCOUNTER — Ambulatory Visit
Admission: RE | Admit: 2020-02-15 | Discharge: 2020-02-15 | Disposition: A | Discharge status: Home / Self Care | Payer: Medicare HMO | Source: Ambulatory Visit | Attending: Obstetrics & Gynecology | Admitting: Obstetrics & Gynecology

## 2020-02-15 DIAGNOSIS — Z1231 Encounter for screening mammogram for malignant neoplasm of breast: Secondary | ICD-10-CM

## 2020-02-15 NOTE — Telephone Encounter (Signed)
Eliquis 5mg  refill request received. Patient is 69 years old, weight-72.4kg, Crea-0.85 on 12/27/2019, Diagnosis-Afib, and last seen by Dr. Tamala Julian on 12/30/2019. Dose is appropriate based on dosing criteria. Will send in refill to requested pharmacy.

## 2020-02-16 DIAGNOSIS — Z0100 Encounter for examination of eyes and vision without abnormal findings: Secondary | ICD-10-CM | POA: Diagnosis not present

## 2020-02-16 DIAGNOSIS — H524 Presbyopia: Secondary | ICD-10-CM | POA: Diagnosis not present

## 2020-02-18 IMAGING — MG DIGITAL SCREENING BILATERAL MAMMOGRAM WITH TOMO AND CAD
8 series · 8 of 24 positions shown · non-contrast
Comparison: Previous exam(s).

CLINICAL DATA: Screening.

EXAM:
DIGITAL SCREENING BILATERAL MAMMOGRAM WITH TOMO AND CAD

[R CC synth-2D]
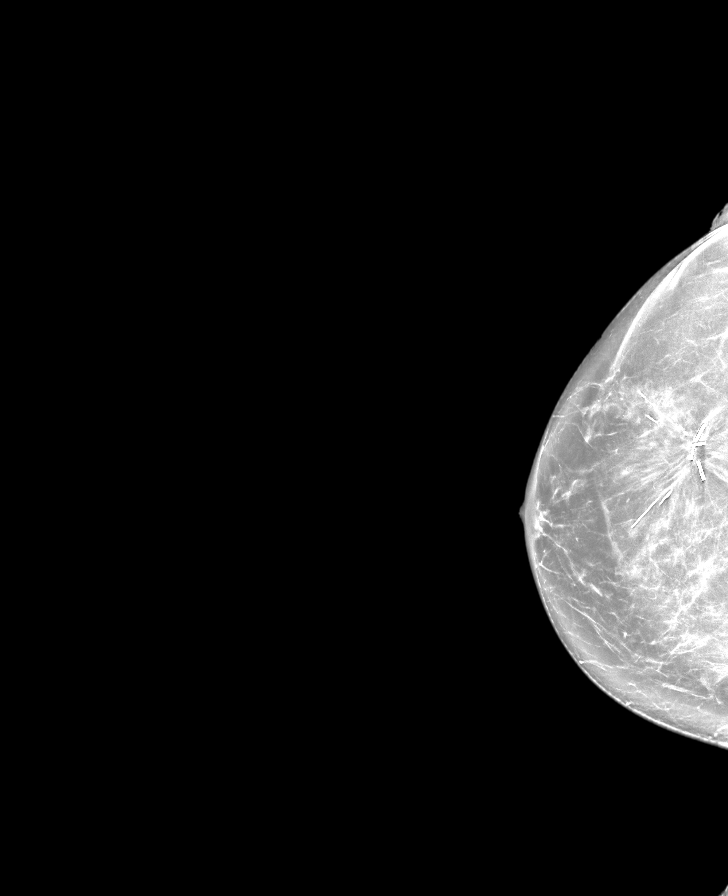

[L CC synth-2D]
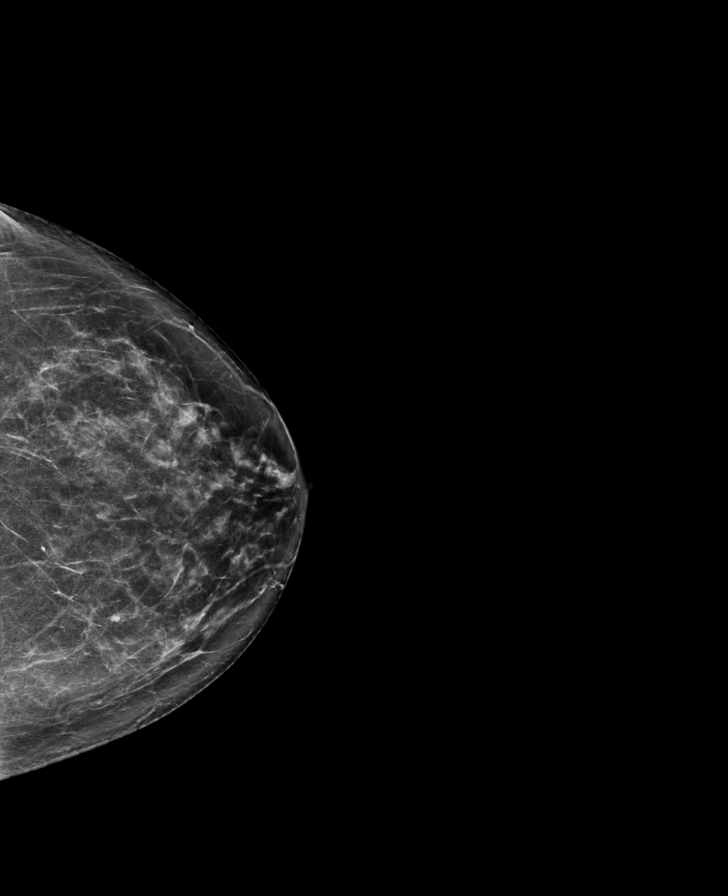

[R MLO synth-2D]
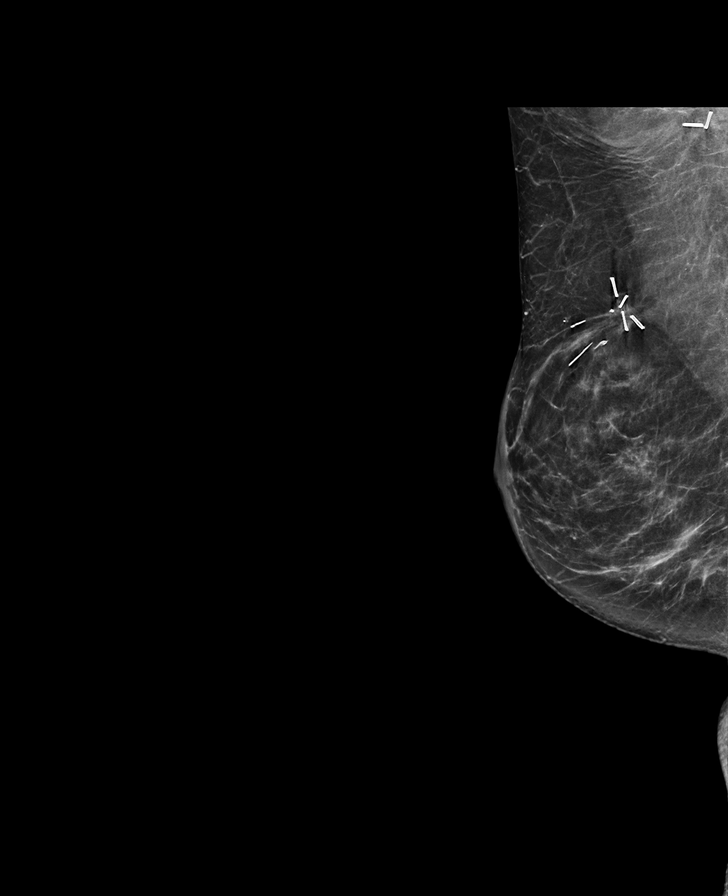

[L MLO synth-2D]
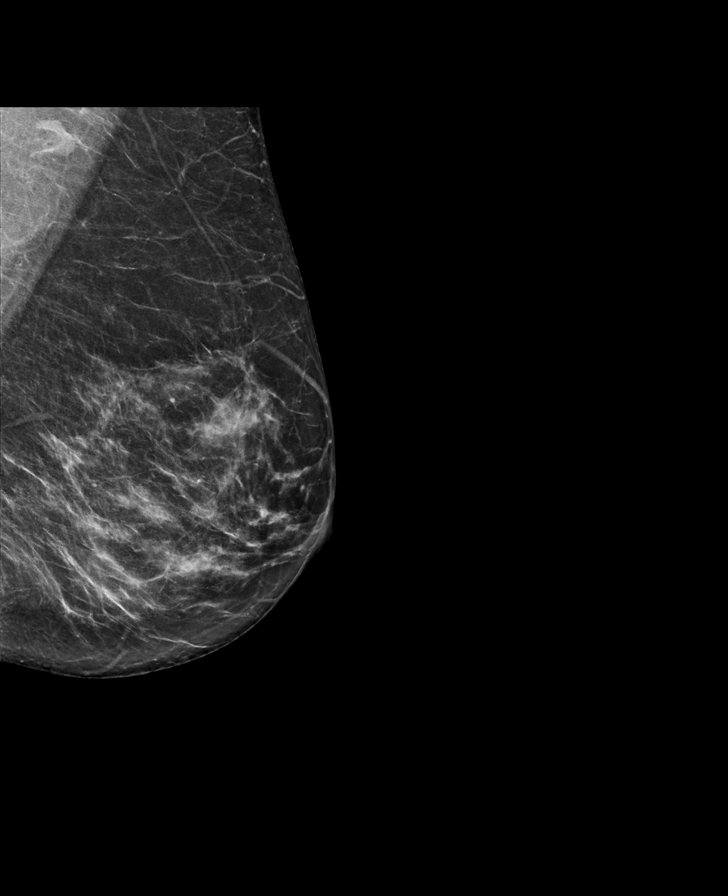

[R MLO tomo · tomo slice 33/66.0]
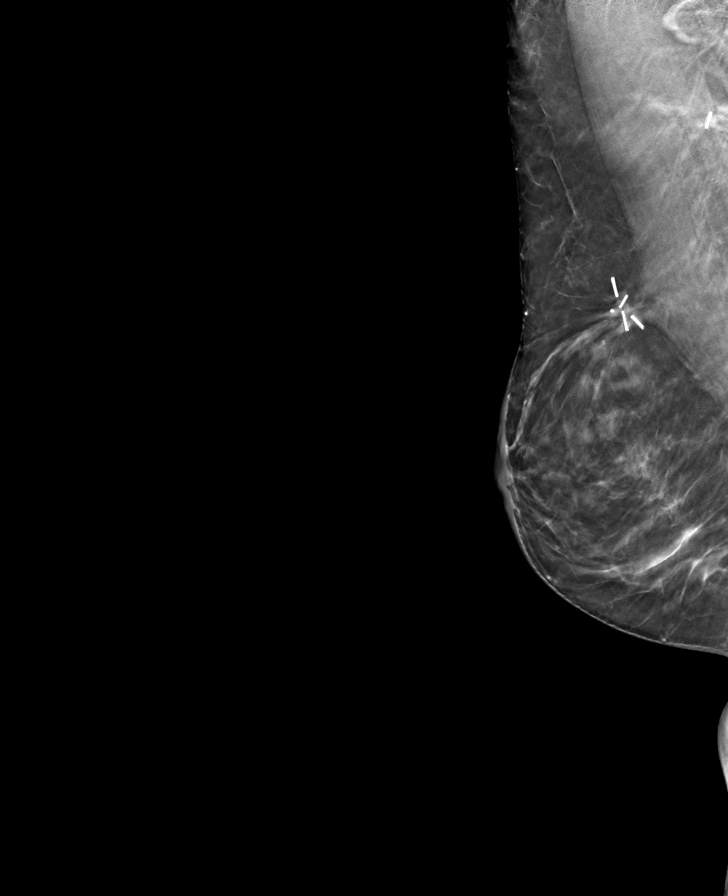

[L MLO tomo · tomo slice 37/72.0]
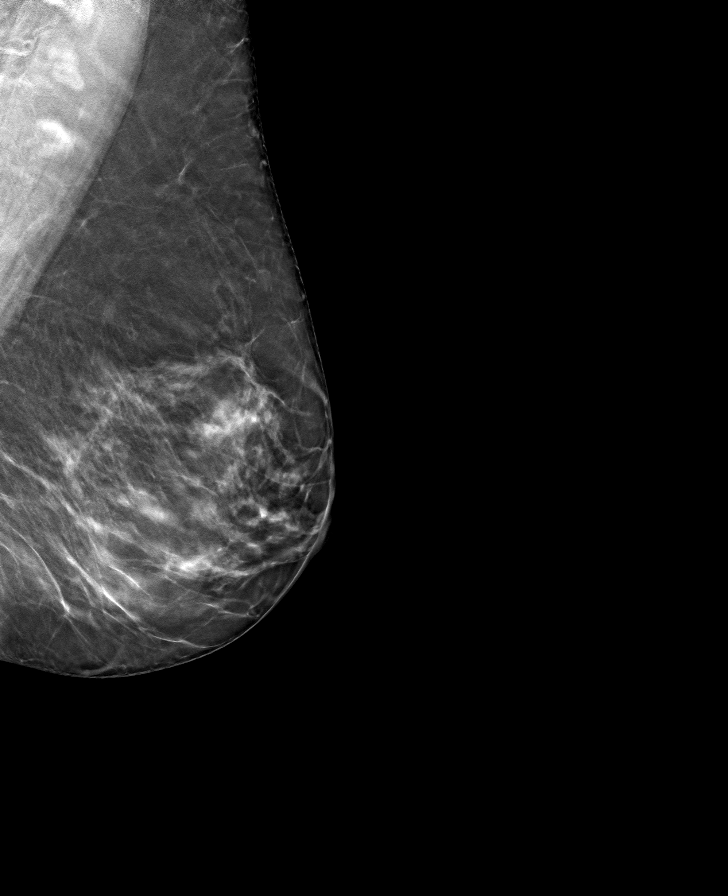

[R CC tomo · tomo slice 36/71.0]
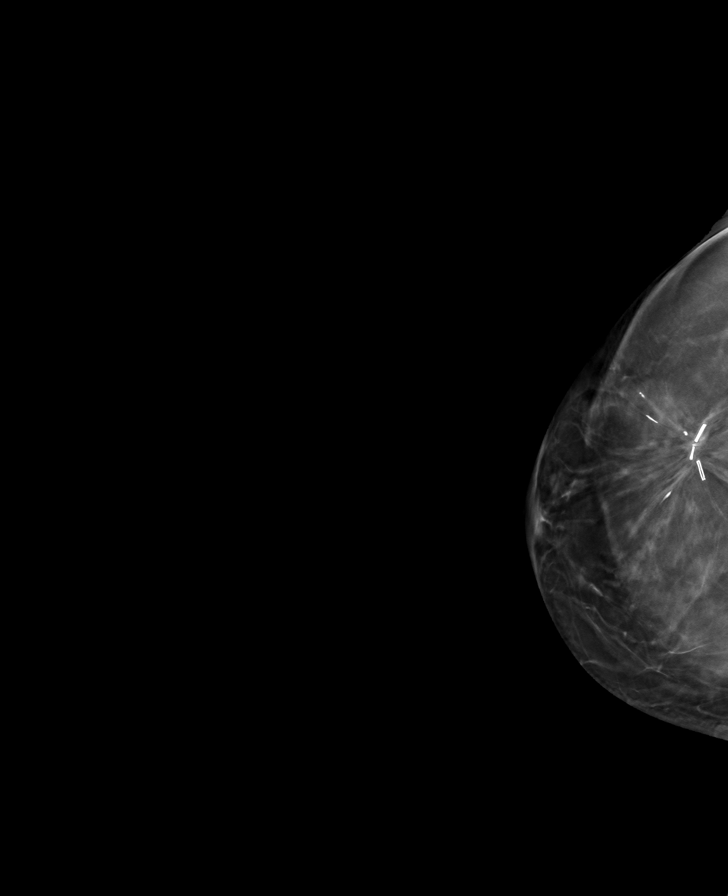

[L CC tomo · tomo slice 39/76.0]
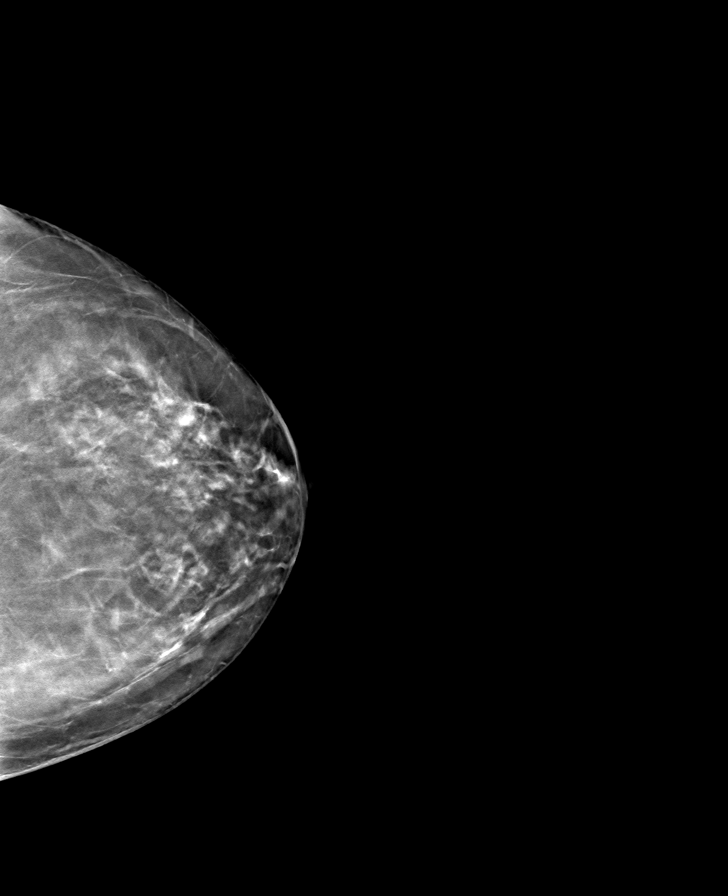

[8 of 24 positions shown; findings below may reference images not displayed]

ACR Breast Density Category c: The breast tissue is heterogeneously
dense, which may obscure small masses.
FINDINGS: There are no findings suspicious for malignancy. Images were
processed with CAD.
IMPRESSION: No mammographic evidence of malignancy. A result letter of this
screening mammogram will be mailed directly to the patient.

RECOMMENDATION:
Screening mammogram in one year. (Code:FT-U-LHB)

BI-RADS CATEGORY  1: Negative.

## 2020-02-21 DIAGNOSIS — G4733 Obstructive sleep apnea (adult) (pediatric): Secondary | ICD-10-CM | POA: Diagnosis not present

## 2020-03-07 DIAGNOSIS — R69 Illness, unspecified: Secondary | ICD-10-CM | POA: Diagnosis not present

## 2020-03-08 DIAGNOSIS — G4733 Obstructive sleep apnea (adult) (pediatric): Secondary | ICD-10-CM | POA: Diagnosis not present

## 2020-04-07 DIAGNOSIS — G4733 Obstructive sleep apnea (adult) (pediatric): Secondary | ICD-10-CM | POA: Diagnosis not present

## 2020-05-08 DIAGNOSIS — G4733 Obstructive sleep apnea (adult) (pediatric): Secondary | ICD-10-CM | POA: Diagnosis not present

## 2020-05-29 DIAGNOSIS — L821 Other seborrheic keratosis: Secondary | ICD-10-CM | POA: Diagnosis not present

## 2020-05-29 DIAGNOSIS — D485 Neoplasm of uncertain behavior of skin: Secondary | ICD-10-CM | POA: Diagnosis not present

## 2020-05-29 DIAGNOSIS — L814 Other melanin hyperpigmentation: Secondary | ICD-10-CM | POA: Diagnosis not present

## 2020-05-29 DIAGNOSIS — L570 Actinic keratosis: Secondary | ICD-10-CM | POA: Diagnosis not present

## 2020-05-29 DIAGNOSIS — D2262 Melanocytic nevi of left upper limb, including shoulder: Secondary | ICD-10-CM | POA: Diagnosis not present

## 2020-06-12 ENCOUNTER — Other Ambulatory Visit: Payer: Self-pay | Admitting: Interventional Cardiology

## 2020-06-12 DIAGNOSIS — E559 Vitamin D deficiency, unspecified: Secondary | ICD-10-CM

## 2020-06-12 DIAGNOSIS — C50912 Malignant neoplasm of unspecified site of left female breast: Secondary | ICD-10-CM

## 2020-06-16 DIAGNOSIS — L649 Androgenic alopecia, unspecified: Secondary | ICD-10-CM | POA: Diagnosis not present

## 2020-06-16 DIAGNOSIS — L821 Other seborrheic keratosis: Secondary | ICD-10-CM | POA: Diagnosis not present

## 2020-06-28 DIAGNOSIS — R42 Dizziness and giddiness: Secondary | ICD-10-CM | POA: Diagnosis not present

## 2020-06-28 DIAGNOSIS — R110 Nausea: Secondary | ICD-10-CM | POA: Diagnosis not present

## 2020-07-06 ENCOUNTER — Ambulatory Visit: Payer: Medicare HMO | Attending: Family Medicine

## 2020-07-06 ENCOUNTER — Other Ambulatory Visit: Payer: Self-pay

## 2020-07-06 DIAGNOSIS — R42 Dizziness and giddiness: Secondary | ICD-10-CM | POA: Diagnosis not present

## 2020-07-06 DIAGNOSIS — H8112 Benign paroxysmal vertigo, left ear: Secondary | ICD-10-CM | POA: Diagnosis not present

## 2020-07-06 DIAGNOSIS — R2689 Other abnormalities of gait and mobility: Secondary | ICD-10-CM | POA: Insufficient documentation

## 2020-07-06 NOTE — Therapy (Signed)
[facility] [address] [place], [place], [address] Phone: [phone]   Fax:  [phone]  Physical Therapy Evaluation  Patient Details  Name: Rhonda Paul MRN: [id number] Date of Birth: 1950-11-05 Referring Provider (PT): Dr. Kathyrn Lass   Encounter Date: 07/06/2020   PT End of Session - 07/06/20 1143    Visit Number 1    Number of Visits 9    Date for PT Re-Evaluation 08/05/20    Authorization Type [employer] Medicare    Progress Note Due on Visit 10    PT Start Time 0935   pt arrived late   PT Stop Time 1015    PT Time Calculation (min) 40 min    Equipment Utilized During Treatment Other (comment)   min guard upon sitting upright after Dix-Hallpike for safety   Activity Tolerance Patient tolerated treatment well    Behavior During Therapy [facility] for tasks assessed/performed           Past Medical History:  Diagnosis Date  . Adenocarcinoma of breast ([place]) 06/03   RIGHT  . Arthritis   . Chronic anticoagulation    Eliquis  . Diverticulitis 2009   diverticulitis  . Heart attack ([place])   . HOH (hard of hearing)    right ear; wears hearing aid  . Hypertension   . Infertility, female 09/01   4 SAB  . Metrorrhagia   . Migraine 09/01  . Permanent atrial fibrillation ([place]) 04/01/2014  . Personal history of chemotherapy 2003  . Personal history of radiation therapy 2003  . STD (sexually transmitted disease) 1976   Hx of HSV  . Stroke ([place]) 01/03/15   s/p TPA R-MCA  . Vitamin D deficiency 07/2006    Past Surgical History:  Procedure Laterality Date  . APPENDECTOMY  1959  . BREAST LUMPECTOMY Right   . BREAST SURGERY Right 11/2001   lumpectomy with sentinel node biopsy X 3 all negative, ER/PR negative , chemo 6 doses, and radiation daily X 6 weeks  . CARDIAC CATHETERIZATION N/A 01/16/2015   Procedure: Left Heart Cath and Coronary Angiography;  Surgeon: Belva Crome, MD; OM1 75%, RCA 30%, EF normal; spasm in  RCA w/ catheter engagement   . COLONOSCOPY  11/2012   diverticulitis, 2 polyps negative, repeat in 5 years  . HALLUX FUSION Left 03/03/2017   Procedure: HALLUX RIGIDUS CORRECTION WITH COLECTOMY DEBRIDEMENT AND CAPSULAR RELEASE OF THE FIRST MPJ WITH IMPLANT LEFT FOOT;  Surgeon: Rosemary Holms, DPM;  Location: [place];  Service: Podiatry;  Laterality: Left;  . JOINT REPLACEMENT Left 02/2017   big toe  . PORT-A-CATH REMOVAL    . RADIOLOGY WITH ANESTHESIA N/A 01/03/2015   Procedure: RADIOLOGY WITH ANESTHESIA;  Surgeon: Medication Radiologist, MD;  Location: [place];  Service: Radiology;  Laterality: N/A;    There were no vitals filed for this visit.    Subjective Assessment - 07/06/20 0942    Subjective Pt reported dizziness began 05/2020 (before Christmas) and dizziness is periodic. She describes it as body moving vs. spinning, as she's had veritgo before and it does not feel the same. Pt states that standing up quickly or tilting head back increases dizziness. Pt has assessed BP while dizziness occurs, as it runs low but stated it's been running 115/64. Pt denied accidents, falls, or illness prior to onset. She has stated that R ear feels plugged [place], pt also has hx of hearing loss in R ear (  has hearing aide). Audiologist feels that issue could be inner ear related. Pt denied weakness, tinnitus, or HA. Pt denied falls in last six months. Pt stands still to improve dizziness, it lasts about 1-2 minutes.    Pertinent History a-fib, moderate vitral valve regurgitation, MI, CVA, HLD, hx of breast CA with chemo and radiation, migraine, R ear HOH-has hearing aide, HTN    Patient Stated Goals To have the episodes stop.    Currently in Pain? No/denies              [facility] PT Assessment - 07/06/20 0947      Assessment   Medical Diagnosis Dizziness    Referring Provider (PT) Dr. Kathyrn Lass    Onset Date/Surgical Date 05/10/20    Hand Dominance Right     Prior Therapy none for dizziness      Precautions   Precautions None      Restrictions   Weight Bearing Restrictions No      Balance Screen   Has the patient fallen in the past 6 months No    Has the patient had a decrease in activity level because of a fear of falling?  No    Is the patient reluctant to leave their home because of a fear of falling?  No      Home Ecologist residence    Living Arrangements Spouse/significant other    Available Help at Discharge Available 24 hours/day    Type of [place] to enter    Entrance Stairs-Number of Steps 2-3    [place] or work area in [place]      Prior Function   Level of Independence Independent    Vocation Full time employment    Vocation Requirements Web development business    Leisure gardening, cooking, quilts      Cognition   Overall Cognitive Status Within Functional Limits for tasks assessed      Ambulation/Gait   Ambulation/Gait Yes    Ambulation/Gait Assistance 7: Independent    Ambulation Distance (Feet) 100 Feet    Assistive device None    Gait Pattern Within Functional Limits    Ambulation Surface Level;Indoor    Gait velocity 4.65ft/sec.                  Vestibular Assessment - 07/06/20 0953      Symptom Behavior   Subjective history of current problem Began in 05/2021    Type of Dizziness  "Funny feeling in head"    Frequency of Dizziness A couple times a day    Duration of Dizziness 1-2 minutes    Symptom Nature Positional    Aggravating Factors Looking up to the ceiling;Sit to stand    Relieving Factors Rest;Slow movements    Progression of Symptoms Worse      Oculomotor Exam   Oculomotor Alignment Normal    Spontaneous Absent    Gaze-induced  Absent    Smooth Pursuits Intact    Saccades Hypometric   Dizziness with L saccades an undershooting noted      Oculomotor Exam-Fixation Suppressed    Left Head Impulse 1-2 beats of saccades    Right Head Impulse Negative      Vestibulo-Ocular Reflex   VOR 1 Head Only (x 1 viewing) Difficultly maintaining target and concordant dizziness and wooziness reported.  Positional Testing   Dix-Hallpike Dix-Hallpike Right;Dix-Hallpike Left    Horizontal Canal Testing Horizontal Canal Right;Horizontal Canal Left      Dix-Hallpike Right   Dix-Hallpike Right Duration 0    Dix-Hallpike Right Symptoms No nystagmus      Dix-Hallpike Left   Dix-Hallpike Left Duration 30 sec. with 5/10 dizziness    Dix-Hallpike Left Symptoms Upbeat, left rotatory nystagmus      Horizontal Canal Right   Horizontal Canal Right Duration 0    Horizontal Canal Right Symptoms Normal      Horizontal Canal Left   Horizontal Canal Left Duration 0    Horizontal Canal Left Symptoms Normal              Objective measurements completed on examination: See above findings.        Vestibular Treatment/Exercise - 07/06/20 1011      Vestibular Treatment/Exercise   Vestibular Treatment Provided Canalith Repositioning    Canalith Repositioning Epley Manuever Left       EPLEY MANUEVER LEFT   Number of Reps  1    Overall Response  Symptoms Resolved     RESPONSE DETAILS LEFT 0/10 dizziness with decr. amplitude of nystagmus.                 PT Education - 07/06/20 1142    Education Details PT educated pt on exam findings and outcome measures, PT POC, frequency, and duration.    Person(s) Educated Patient    Methods Explanation    Comprehension Verbalized understanding            PT Short Term Goals - 07/06/20 1157      PT SHORT TERM GOAL #1   Title same as LTGs             PT Long Term Goals - 07/06/20 1157      PT LONG TERM GOAL #1   Title Pt will be IND in HEP to improve dizziness and balance. TARGET DATE FOR ALL LTGS: 08/03/20    Time 4    Period Weeks    Status New      PT LONG TERM GOAL  #2   Title Perform FGA and and assess for orthostatic hypotension and write goals as indicated.    Time 4    Period Weeks    Status New      PT LONG TERM GOAL #3   Title Trial amb. over uneven terrain with head turns and write goal as indicated.    Time 4    Period Weeks    Status New      PT LONG TERM GOAL #4   Title Pt will report 1/10 dizziness during all ADLs in order to improve safety during mobility.    Baseline 5/10 at worst    Time 4    Period Weeks    Status New                  Plan - 07/06/20 1145    Clinical Impression Statement Pt is a pleasant 69y/o female presenting to OPPT neuro for dizziness. Pt's PMH is significant for the following: a-fib, moderate vitral valve regurgitation, MI, CVA, HLD, hx of breast CA with chemo and radiation, migraine, R ear HOH-has hearing aide, HTN. Pt's gait speed was WNL. The following impairments were noted upon exam: dizziness and impaired balance after testing/treatment of BPPV. Pt's dizziness likely multifactorial as pt experienced concordant dizziness during oculomotor exam consistent with L vestibular hypofunction  and L upbeating rotational nystagmus consistent with pBPPV. Pt's dizziness resolved after L Epley treatment but PT did note small amplitude nystagmus after L epley's. PT will formally assess balance next session. Pt would benefit from skilled PT to improve safety during functional mobility.    Personal Factors and Comorbidities Comorbidity 3+;Age    Comorbidities a-fib, moderate vitral valve regurgitation, MI, CVA, HLD, hx of breast CA with chemo and radiation, migraine, R ear HOH-has hearing aide, HTN    Examination-Activity Limitations Bed Mobility;Locomotion Level;Transfers;Stand    Examination-Participation Restrictions Occupation;Cleaning;Yard Work    Stability/Clinical Decision Making Stable/Uncomplicated    Designer, jewellery Low    Rehab Potential Good    PT Frequency 2x / week    PT Duration 4 weeks     PT Treatment/Interventions ADLs/Self Care Home Management;Canalith Repostioning;DME Instruction;Gait training;Stair training;Functional mobility training;Therapeutic activities;Therapeutic exercise;Balance training;Vestibular;Patient/family education;Neuromuscular re-education    PT Next Visit Plan Reassess L BPPV and treat as indicated. Perform orthostatic assessment, perform FGA and amb. over uneven terrain with head turns/nods and write goal prn. Provide pt with VOR x1 viewing, balance HEP.    Consulted and Agree with Plan of Care Patient           Patient will benefit from skilled therapeutic intervention in order to improve the following deficits and impairments:  Abnormal gait,Decreased mobility,Dizziness,Decreased balance  Visit Diagnosis: BPPV (benign paroxysmal positional vertigo), left - Plan: PT plan of care cert/re-cert  Dizziness and giddiness - Plan: PT plan of care cert/re-cert  Other abnormalities of gait and mobility - Plan: PT plan of care cert/re-cert     Problem List Patient Active Problem List   Diagnosis Date Noted  . CAD in native artery 07/28/2016  . Chronic anticoagulation    . NSTEMI- med rx, culprit lesion 75% OM1 01/14/2015  . Atelectasis   . Acute respiratory failure with hypoxemia ([place])   . CVA (cerebral infarction) 01/03/2015  . Permanent atrial fibrillation ([place]) 04/01/2014  . Moderate mitral regurgitation 04/01/2014  . Breast cancer ([place]) 04/01/2014  . Hyperlipidemia 04/01/2014    Zayan Delvecchio L 07/06/2020, 12:04 PM  [facility] [address] [place], [place], [address] Phone: [phone]   Fax:  [phone]  Name: Rhonda Paul MRN: [id number] Date of Birth: Dec 19, 1950  Geoffry Paradise, PT,DPT 07/06/20 12:04 PM Phone: [phone] Fax: [phone]

## 2020-07-10 ENCOUNTER — Other Ambulatory Visit: Payer: Self-pay

## 2020-07-10 ENCOUNTER — Ambulatory Visit: Payer: Medicare HMO | Admitting: Physical Therapy

## 2020-07-10 ENCOUNTER — Encounter: Payer: Self-pay | Admitting: Physical Therapy

## 2020-07-10 VITALS — BP 127/73 | HR 73

## 2020-07-10 DIAGNOSIS — R42 Dizziness and giddiness: Secondary | ICD-10-CM

## 2020-07-10 DIAGNOSIS — H8112 Benign paroxysmal vertigo, left ear: Secondary | ICD-10-CM

## 2020-07-10 DIAGNOSIS — R2689 Other abnormalities of gait and mobility: Secondary | ICD-10-CM | POA: Diagnosis not present

## 2020-07-10 NOTE — Therapy (Signed)
[facility] [address] [place], [place], [address] Phone: [phone]   Fax:  [phone]  Physical Therapy Treatment  Patient Details  Name: COLBIE VESCOVI MRN: [id number] Date of Birth: 11-13-1950 Referring Provider (PT): Dr. Kathyrn Lass   Encounter Date: 07/10/2020   PT End of Session - 07/10/20 2209    Visit Number 2    Number of Visits 9    Date for PT Re-Evaluation 08/05/20    Authorization Type [employer]etna Medicare    Progress Note Due on Visit 10    PT Start Time 1017    PT Stop Time 1102    PT Time Calculation (min) 45 min    Activity Tolerance Patient tolerated treatment well    Behavior During Therapy [facility] for tasks assessed/performed           Past Medical History:  Diagnosis Date  . Adenocarcinoma of breast ([place]) 06/03   RIGHT  . Arthritis   . Chronic anticoagulation    Eliquis  . Diverticulitis 2009   diverticulitis  . Heart attack ([place])   . HOH (hard of hearing)    right ear; wears hearing aid  . Hypertension   . Infertility, female 09/01   4 SAB  . Metrorrhagia   . Migraine 09/01  . Permanent atrial fibrillation ([place]) 04/01/2014  . Personal history of chemotherapy 2003  . Personal history of radiation therapy 2003  . STD (sexually transmitted disease) 1976   Hx of HSV  . Stroke ([place]) 01/03/15   s/p TPA R-MCA  . Vitamin D deficiency 07/2006    Past Surgical History:  Procedure Laterality Date  . APPENDECTOMY  1959  . BREAST LUMPECTOMY Right   . BREAST SURGERY Right 11/2001   lumpectomy with sentinel node biopsy X 3 all negative, ER/PR negative , chemo 6 doses, and radiation daily X 6 weeks  . CARDIAC CATHETERIZATION N/A 01/16/2015   Procedure: Left Heart Cath and Coronary Angiography;  Surgeon: Belva Crome, MD; OM1 75%, RCA 30%, EF normal; spasm in RCA w/ catheter engagement   . COLONOSCOPY  11/2012   diverticulitis, 2 polyps negative, repeat in 5 years  . HALLUX FUSION Left  03/03/2017   Procedure: HALLUX RIGIDUS CORRECTION WITH COLECTOMY DEBRIDEMENT AND CAPSULAR RELEASE OF THE FIRST MPJ WITH IMPLANT LEFT FOOT;  Surgeon: Rosemary Holms, DPM;  Location: [place];  Service: Podiatry;  Laterality: Left;  . JOINT REPLACEMENT Left 02/2017   big toe  . PORT-A-CATH REMOVAL    . RADIOLOGY WITH ANESTHESIA N/A 01/03/2015   Procedure: RADIOLOGY WITH ANESTHESIA;  Surgeon: Medication Radiologist, MD;  Location: [facility];  Service: Radiology;  Laterality: N/A;    Vitals:   07/10/20 1040  BP: 127/73  Pulse: 73     Subjective Assessment - 07/10/20 2203    Subjective Pt reports dizziness is much improved since treatment was performed last session at eval, but states it is not completely gone    Pertinent History a-fib, moderate vitral valve regurgitation, MI, CVA, HLD, hx of breast CA with chemo and radiation, migraine, R ear HOH-has hearing aide, HTN    Patient Stated Goals To have the episodes stop.    Currently in Pain? No/denies                   Vestibular Assessment - 07/10/20 1044      Orthostatics   BP supine (x 5 minutes) 127/78    HR supine (x 5 minutes)  75    BP sitting 134/73    HR sitting 72    BP standing (after 1 minute) 135/75    HR standing (after 1 minute) 66    BP standing (after 3 minutes) 135/93    HR standing (after 3 minutes) 69                     Vestibular Treatment/Exercise - 07/10/20 0001      Vestibular Treatment/Exercise   Vestibular Treatment Provided Canalith Repositioning    Canalith Repositioning Epley Manuever Left       EPLEY MANUEVER LEFT   Number of Reps  2    Overall Response  Symptoms Resolved     RESPONSE DETAILS LEFT no c/o dizziness on 2nd rep                 PT Education - 07/10/20 2208    Education Details pt was given article from [place] on etiology of BPPV    Person(s) Educated Patient    Methods Explanation;Demonstration;Handout    Comprehension Verbalized  understanding;Returned demonstration            PT Short Term Goals - 07/06/20 1157      PT SHORT TERM GOAL #1   Title same as LTGs             PT Long Term Goals - 07/10/20 2212      PT LONG TERM GOAL #1   Title Pt will be IND in HEP to improve dizziness and balance. TARGET DATE FOR ALL LTGS: 08/03/20    Time 4    Period Weeks    Status New      PT LONG TERM GOAL #2   Title Perform FGA and and assess for orthostatic hypotension and write goals as indicated.    Time 4    Period Weeks    Status New      PT LONG TERM GOAL #3   Title Trial amb. over uneven terrain with head turns and write goal as indicated.    Time 4    Period Weeks    Status New      PT LONG TERM GOAL #4   Title Pt will report 1/10 dizziness during all ADLs in order to improve safety during mobility.    Baseline 5/10 at worst    Time 4    Period Weeks    Status New                 Plan - 07/10/20 2209    Clinical Impression Statement Pt had (+) Lt Dix-Hallpike test with mild upbeating Lt rotary nystagmus, indicative of Lt BPPV; symptoms resolved on 2nd rep of Epley's maneuver.  Orthostatics completed - BP readings do not indicate orthostatic hypotension at this time.    Personal Factors and Comorbidities Comorbidity 3+;Age    Comorbidities a-fib, moderate vitral valve regurgitation, MI, CVA, HLD, hx of breast CA with chemo and radiation, migraine, R ear HOH-has hearing aide, HTN    Examination-Activity Limitations Bed Mobility;Locomotion Level;Transfers;Stand    Examination-Participation Restrictions Occupation;Cleaning;Yard Work    Stability/Clinical Decision Making Stable/Uncomplicated    Rehab Potential Good    PT Frequency 2x / week    PT Duration 4 weeks    PT Treatment/Interventions ADLs/Self Care Home Management;Canalith Repostioning;DME Instruction;Gait training;Stair training;Functional mobility training;Therapeutic activities;Therapeutic exercise;Balance  training;Vestibular;Patient/family education;Neuromuscular re-education    PT Next Visit Plan Pt requesting possible D/C at next session if BPPV remains resolved;  perform FGA and amb. over uneven terrain with head turns/nods and write goal prn. Provide pt with VOR x1 viewing, balance HEP.    Consulted and Agree with Plan of Care Patient           Patient will benefit from skilled therapeutic intervention in order to improve the following deficits and impairments:  Abnormal gait,Decreased mobility,Dizziness,Decreased balance  Visit Diagnosis: BPPV (benign paroxysmal positional vertigo), left  Dizziness and giddiness     Problem List Patient Active Problem List   Diagnosis Date Noted  . CAD in native artery 07/28/2016  . Chronic anticoagulation    . NSTEMI- med rx, culprit lesion 75% OM1 01/14/2015  . Atelectasis   . Acute respiratory failure with hypoxemia ([place])   . CVA (cerebral infarction) 01/03/2015  . Permanent atrial fibrillation ([place]) 04/01/2014  . Moderate mitral regurgitation 04/01/2014  . Breast cancer ([place]) 04/01/2014  . Hyperlipidemia 04/01/2014    Alda Lea, PT 07/10/2020, 10:13 PM  [facility] [address] [place], [place], [address] Phone: [phone]   Fax:  [phone]  Name: MAURICIA MERTENS MRN: [id number] Date of Birth: 29-Jun-1950

## 2020-07-13 ENCOUNTER — Other Ambulatory Visit: Payer: Self-pay

## 2020-07-13 ENCOUNTER — Ambulatory Visit: Payer: Medicare HMO | Attending: Family Medicine

## 2020-07-13 DIAGNOSIS — H8112 Benign paroxysmal vertigo, left ear: Secondary | ICD-10-CM

## 2020-07-13 DIAGNOSIS — R42 Dizziness and giddiness: Secondary | ICD-10-CM | POA: Diagnosis not present

## 2020-07-13 DIAGNOSIS — R2689 Other abnormalities of gait and mobility: Secondary | ICD-10-CM

## 2020-07-13 NOTE — Therapy (Signed)
[facility] [address] [place], [place], [address] Phone: [phone]   Fax:  [phone]  Physical Therapy Treatment  Patient Details  Name: Rhonda Paul MRN: [id number] Date of Birth: [birth year] Referring Provider (PT): Dr. Kathyrn Lass   Encounter Date: 07/13/2020   PT End of Session - 07/13/20 1218    Visit Number 3    Number of Visits 9    Date for PT Re-Evaluation 08/05/20    Authorization Type [employer]    Progress Note Due on Visit 10    PT Start Time 1148    PT Stop Time 1211   pt placed on hold   PT Time Calculation (min) 23 min    Equipment Utilized During Treatment Other (comment)   min guard to S prn during FGA   Activity Tolerance Patient tolerated treatment well    Behavior During Therapy [facility] for tasks assessed/performed           Past Medical History:  Diagnosis Date  . Adenocarcinoma of breast ([place]) 06/03   RIGHT  . Arthritis   . Chronic anticoagulation    Eliquis  . Diverticulitis 2009   diverticulitis  . Heart attack ([place])   . HOH (hard of hearing)    right ear; wears hearing aid  . Hypertension   . Infertility, female 09/01   4 SAB  . Metrorrhagia   . Migraine 09/01  . Permanent atrial fibrillation ([place]) 04/01/2014  . Personal history of chemotherapy 2003  . Personal history of radiation therapy 2003  . STD (sexually transmitted disease) 1976   Hx of HSV  . Stroke ([place]) 01/03/15   s/p TPA R-MCA  . Vitamin D deficiency 07/2006    Past Surgical History:  Procedure Laterality Date  . APPENDECTOMY  1959  . BREAST LUMPECTOMY Right   . BREAST SURGERY Right 11/2001   lumpectomy with sentinel node biopsy X 3 all negative, ER/PR negative , chemo 6 doses, and radiation daily X 6 weeks  . CARDIAC CATHETERIZATION N/A 01/16/2015   Procedure: Left Heart Cath and Coronary Angiography;  Surgeon: Belva Crome, MD; OM1 75%, RCA 30%, EF normal; spasm in RCA w/ catheter engagement   .  COLONOSCOPY  11/2012   diverticulitis, 2 polyps negative, repeat in 5 years  . HALLUX FUSION Left 03/03/2017   Procedure: HALLUX RIGIDUS CORRECTION WITH COLECTOMY DEBRIDEMENT AND CAPSULAR RELEASE OF THE FIRST MPJ WITH IMPLANT LEFT FOOT;  Surgeon: Rosemary Holms, DPM;  Location: [facility];  Service: Podiatry;  Laterality: Left;  . JOINT REPLACEMENT Left 02/2017   big toe  . PORT-A-CATH REMOVAL    . RADIOLOGY WITH ANESTHESIA N/A 01/03/2015   Procedure: RADIOLOGY WITH ANESTHESIA;  Surgeon: Medication Radiologist, MD;  Location: [facility];  Service: Radiology;  Laterality: N/A;    There were no vitals filed for this visit.   Subjective Assessment - 07/13/20 1152    Subjective Pt denied dizziness since last visit and was able to perform yoga without dizziness. Pt denied falls. Pt reported she has an orthotic for great toe arthritis, with surgery for L toe replacement three years ago. If she amb. without orthotic, then gait is antalgic.    Pertinent History a-fib, moderate vitral valve regurgitation, MI, CVA, HLD, hx of breast CA with chemo and radiation, migraine, R ear HOH-has hearing aide, HTN    Patient Stated Goals To have the episodes stop.    Currently in Pain? No/denies  [facility] PT Assessment - 07/13/20 1158      Functional Gait  Assessment   Gait assessed  Yes    Gait Level Surface Walks 20 ft in less than 5.5 sec, no assistive devices, good speed, no evidence for imbalance, normal gait pattern, deviates no more than 6 in outside of the 12 in walkway width.    Change in Gait Speed Able to smoothly change walking speed without loss of balance or gait deviation. Deviate no more than 6 in outside of the 12 in walkway width.    Gait with Horizontal Head Turns Performs head turns smoothly with no change in gait. Deviates no more than 6 in outside 12 in walkway width    Gait with Vertical Head Turns Performs head turns with no change in gait. Deviates no more than 6  in outside 12 in walkway width.    Gait and Pivot Turn Pivot turns safely within 3 sec and stops quickly with no loss of balance.    Step Over Obstacle Is able to step over 2 stacked shoe boxes taped together (9 in total height) without changing gait speed. No evidence of imbalance.    Gait with Narrow Base of Support Is able to ambulate for 10 steps heel to toe with no staggering.    Gait with Eyes Closed Walks 20 ft, no assistive devices, good speed, no evidence of imbalance, normal gait pattern, deviates no more than 6 in outside 12 in walkway width. Ambulates 20 ft in less than 7 sec.    Ambulating Backwards Walks 20 ft, no assistive devices, good speed, no evidence for imbalance, normal gait    Steps Alternating feet, no rail.    Total Score 30    FGA comment: 30/30: WNL                         [facility] Adult PT Treatment/Exercise - 07/13/20 1205      Ambulation/Gait   Ambulation/Gait Yes    Ambulation/Gait Assistance 7: Independent    Ambulation Distance (Feet) 230 Feet    Assistive device None    Gait Pattern Within Functional Limits    Ambulation Surface Unlevel;Level;Indoor;Other (comment)   red and blue mats x2 reps                 PT Education - 07/13/20 1218    Education Details PT discussed goal progress and placing pt on hold for two weeks with potential d/c at that time based on if BPPV returns. Pt agreeable. PT educated pt on FGA results.    Person(s) Educated Patient    Methods Explanation    Comprehension Verbalized understanding            PT Short Term Goals - 07/06/20 1157      PT SHORT TERM GOAL #1   Title same as LTGs             PT Long Term Goals - 07/13/20 1156      PT LONG TERM GOAL #1   Title Pt will be IND in HEP to improve dizziness and balance. TARGET DATE FOR ALL LTGS: 08/03/20    Time 4    Period Weeks    Status Deferred      PT LONG TERM GOAL #2   Title Perform FGA and and assess for orthostatic hypotension and  write goals as indicated.    Baseline 30/30    Time 4    Period  Weeks    Status Achieved      PT LONG TERM GOAL #3   Title Trial amb. over uneven terrain with head turns and write goal as indicated.    Baseline IND over even and uneven terrain indoors.    Time 4    Period Weeks    Status Achieved      PT LONG TERM GOAL #4   Title Pt will report 1/10 dizziness during all ADLs in order to improve safety during mobility.    Baseline 5/10 at worst, 0/10 on 07/13/20    Time 4    Period Weeks    Status Achieved                 Plan - 07/13/20 1220    Clinical Impression Statement Pt met all LTGs and reported no dizziness since last visit. Pt's FGA score was WNL. PT placed pt on hold as goals met and will d/c in two weeks if pt reports no BPPV at that time.    Personal Factors and Comorbidities Comorbidity 3+;Age    Comorbidities a-fib, moderate vitral valve regurgitation, MI, CVA, HLD, hx of breast CA with chemo and radiation, migraine, R ear HOH-has hearing aide, HTN    Examination-Activity Limitations Bed Mobility;Locomotion Level;Transfers;Stand    Examination-Participation Restrictions Occupation;Cleaning;Yard Work    Stability/Clinical Decision Making Stable/Uncomplicated    Rehab Potential Good    PT Frequency 2x / week    PT Duration 4 weeks    PT Treatment/Interventions ADLs/Self Care Home Management;Canalith Repostioning;DME Instruction;Gait training;Stair training;Functional mobility training;Therapeutic activities;Therapeutic exercise;Balance training;Vestibular;Patient/family education;Neuromuscular re-education    PT Next Visit Plan hold for two weeks with d/c after that time if BPPV does not return.    Consulted and Agree with Plan of Care Patient           Patient will benefit from skilled therapeutic intervention in order to improve the following deficits and impairments:  Abnormal gait,Decreased mobility,Dizziness,Decreased balance  Visit  Diagnosis: Dizziness and giddiness  Other abnormalities of gait and mobility  BPPV (benign paroxysmal positional vertigo), left     Problem List Patient Active Problem List   Diagnosis Date Noted  . CAD in native artery 07/28/2016  . Chronic anticoagulation    . NSTEMI- med rx, culprit lesion 75% OM1 01/14/2015  . Atelectasis   . Acute respiratory failure with hypoxemia ([place])   . CVA (cerebral infarction) 01/03/2015  . Permanent atrial fibrillation ([place]) 04/01/2014  . Moderate mitral regurgitation 04/01/2014  . Breast cancer ([place]) 04/01/2014  . Hyperlipidemia 04/01/2014    Elainah Rhyne L 07/13/2020, 12:21 PM  [facility] [address] [place], [place], [address] Phone: [phone]   Fax:  [phone]  Name: Rhonda Paul MRN: [id number] Date of Birth: July 28, 1950  Geoffry Paradise, PT,DPT 07/13/20 12:22 PM Phone: [phone] Fax: [phone]

## 2020-07-14 DIAGNOSIS — G4733 Obstructive sleep apnea (adult) (pediatric): Secondary | ICD-10-CM | POA: Diagnosis not present

## 2020-07-17 ENCOUNTER — Ambulatory Visit: Payer: Medicare HMO | Admitting: Physical Therapy

## 2020-07-24 ENCOUNTER — Encounter: Payer: Medicare HMO | Admitting: Physical Therapy

## 2020-07-31 ENCOUNTER — Ambulatory Visit: Payer: Medicare HMO | Admitting: Physical Therapy

## 2020-08-03 ENCOUNTER — Ambulatory Visit: Payer: Medicare HMO

## 2020-08-11 DIAGNOSIS — G4733 Obstructive sleep apnea (adult) (pediatric): Secondary | ICD-10-CM | POA: Diagnosis not present

## 2020-09-11 DIAGNOSIS — G4733 Obstructive sleep apnea (adult) (pediatric): Secondary | ICD-10-CM | POA: Diagnosis not present

## 2020-10-09 ENCOUNTER — Other Ambulatory Visit: Payer: Self-pay | Admitting: Obstetrics & Gynecology

## 2020-10-09 ENCOUNTER — Ambulatory Visit (HOSPITAL_BASED_OUTPATIENT_CLINIC_OR_DEPARTMENT_OTHER): Payer: Medicare HMO | Admitting: Obstetrics & Gynecology

## 2020-10-10 ENCOUNTER — Ambulatory Visit: Payer: Medicare HMO

## 2020-10-11 DIAGNOSIS — D6869 Other thrombophilia: Secondary | ICD-10-CM | POA: Diagnosis not present

## 2020-10-11 DIAGNOSIS — R1030 Lower abdominal pain, unspecified: Secondary | ICD-10-CM | POA: Diagnosis not present

## 2020-10-11 DIAGNOSIS — E7800 Pure hypercholesterolemia, unspecified: Secondary | ICD-10-CM | POA: Diagnosis not present

## 2020-10-11 DIAGNOSIS — Z0000 Encounter for general adult medical examination without abnormal findings: Secondary | ICD-10-CM | POA: Diagnosis not present

## 2020-10-11 DIAGNOSIS — E039 Hypothyroidism, unspecified: Secondary | ICD-10-CM | POA: Diagnosis not present

## 2020-10-11 DIAGNOSIS — Z8673 Personal history of transient ischemic attack (TIA), and cerebral infarction without residual deficits: Secondary | ICD-10-CM | POA: Diagnosis not present

## 2020-10-11 DIAGNOSIS — I480 Paroxysmal atrial fibrillation: Secondary | ICD-10-CM | POA: Diagnosis not present

## 2020-10-11 DIAGNOSIS — Z6825 Body mass index (BMI) 25.0-25.9, adult: Secondary | ICD-10-CM | POA: Diagnosis not present

## 2020-10-11 DIAGNOSIS — Z1389 Encounter for screening for other disorder: Secondary | ICD-10-CM | POA: Diagnosis not present

## 2020-10-18 ENCOUNTER — Other Ambulatory Visit: Payer: Self-pay | Admitting: Family Medicine

## 2020-10-18 DIAGNOSIS — R1030 Lower abdominal pain, unspecified: Secondary | ICD-10-CM

## 2020-10-20 DIAGNOSIS — G4733 Obstructive sleep apnea (adult) (pediatric): Secondary | ICD-10-CM | POA: Diagnosis not present

## 2020-11-08 ENCOUNTER — Ambulatory Visit
Admission: RE | Admit: 2020-11-08 | Discharge: 2020-11-08 | Disposition: A | Discharge status: Home / Self Care | Payer: Medicare HMO | Source: Ambulatory Visit | Attending: Family Medicine | Admitting: Family Medicine

## 2020-11-08 DIAGNOSIS — K828 Other specified diseases of gallbladder: Secondary | ICD-10-CM | POA: Diagnosis not present

## 2020-11-08 DIAGNOSIS — K429 Umbilical hernia without obstruction or gangrene: Secondary | ICD-10-CM | POA: Diagnosis not present

## 2020-11-08 DIAGNOSIS — D259 Leiomyoma of uterus, unspecified: Secondary | ICD-10-CM | POA: Diagnosis not present

## 2020-11-08 DIAGNOSIS — K5730 Diverticulosis of large intestine without perforation or abscess without bleeding: Secondary | ICD-10-CM | POA: Diagnosis not present

## 2020-11-08 DIAGNOSIS — R1030 Lower abdominal pain, unspecified: Secondary | ICD-10-CM

## 2020-11-08 MED ORDER — IOPAMIDOL (ISOVUE-300) INJECTION 61%
100.0000 mL | Freq: Once | INTRAVENOUS | Status: AC | PRN
  Administered 2020-11-08: 100 mL via INTRAVENOUS

## 2020-11-20 DIAGNOSIS — G4733 Obstructive sleep apnea (adult) (pediatric): Secondary | ICD-10-CM | POA: Diagnosis not present

## 2020-12-13 ENCOUNTER — Other Ambulatory Visit: Payer: Self-pay | Admitting: Interventional Cardiology

## 2020-12-13 DIAGNOSIS — E559 Vitamin D deficiency, unspecified: Secondary | ICD-10-CM

## 2020-12-13 DIAGNOSIS — C50912 Malignant neoplasm of unspecified site of left female breast: Secondary | ICD-10-CM

## 2020-12-19 ENCOUNTER — Other Ambulatory Visit: Payer: Self-pay

## 2020-12-19 ENCOUNTER — Ambulatory Visit (INDEPENDENT_AMBULATORY_CARE_PROVIDER_SITE_OTHER): Payer: Medicare HMO | Admitting: Obstetrics & Gynecology

## 2020-12-19 ENCOUNTER — Other Ambulatory Visit (HOSPITAL_COMMUNITY)
Admission: RE | Admit: 2020-12-19 | Discharge: 2020-12-19 | Disposition: A | Discharge status: Home / Self Care | Payer: Medicare HMO | Source: Ambulatory Visit | Attending: Obstetrics & Gynecology | Admitting: Obstetrics & Gynecology

## 2020-12-19 ENCOUNTER — Encounter (HOSPITAL_BASED_OUTPATIENT_CLINIC_OR_DEPARTMENT_OTHER): Payer: Self-pay | Admitting: Obstetrics & Gynecology

## 2020-12-19 VITALS — BP 132/79 | HR 62 | Ht 64.5 in | Wt 155.0 lb

## 2020-12-19 DIAGNOSIS — C50011 Malignant neoplasm of nipple and areola, right female breast: Secondary | ICD-10-CM

## 2020-12-19 DIAGNOSIS — A609 Anogenital herpesviral infection, unspecified: Secondary | ICD-10-CM | POA: Diagnosis not present

## 2020-12-19 DIAGNOSIS — Z9189 Other specified personal risk factors, not elsewhere classified: Secondary | ICD-10-CM | POA: Insufficient documentation

## 2020-12-19 DIAGNOSIS — Z8673 Personal history of transient ischemic attack (TIA), and cerebral infarction without residual deficits: Secondary | ICD-10-CM

## 2020-12-19 DIAGNOSIS — Z780 Asymptomatic menopausal state: Secondary | ICD-10-CM

## 2020-12-19 DIAGNOSIS — Z01419 Encounter for gynecological examination (general) (routine) without abnormal findings: Secondary | ICD-10-CM

## 2020-12-19 DIAGNOSIS — Z1151 Encounter for screening for human papillomavirus (HPV): Secondary | ICD-10-CM | POA: Diagnosis not present

## 2020-12-19 DIAGNOSIS — R69 Illness, unspecified: Secondary | ICD-10-CM | POA: Diagnosis not present

## 2020-12-19 DIAGNOSIS — Z124 Encounter for screening for malignant neoplasm of cervix: Secondary | ICD-10-CM | POA: Insufficient documentation

## 2020-12-19 DIAGNOSIS — I252 Old myocardial infarction: Secondary | ICD-10-CM | POA: Diagnosis not present

## 2020-12-19 NOTE — Progress Notes (Signed)
70 y.o. G3P0040 Married White or Caucasian female here for breast and pelvic exam.  Denies vaginal bleeding.  H/o abdominal bloating and pain.  Had CT 11/08/2020 (reviewed personally) showing diverticulosis, small fibroid, and fatty liver.  Pt also had blood work that was normal per pt report.  I do not have copies of this.  She is taking some Align but not sure this is helping.  Patient's last menstrual period was 11/08/2001 (approximate).          Sexually active: Yes.    H/O STD:  no  Health Maintenance: PCP:  Kathyrn Lass, MD.  Last wellness appt was 10/11/2020.  Did blood work at that appt:  yes Vaccines are up to date:  yes Colonoscopy:  12/24/2017 MMG:  02/15/2020 BMD:  02/11/2019 Last pap smear:  03/12/2018.   H/o abnormal pap smear: no   reports that she has never smoked. She has never used smokeless tobacco. She reports current alcohol use of about 7.0 standard drinks of alcohol per week. She reports that she does not use drugs.  Past Medical History:  Diagnosis Date   Adenocarcinoma of breast ([place]) 06/03   RIGHT   Arthritis    Chronic anticoagulation    Eliquis   Diverticulitis 2009   diverticulitis   Heart attack ([place])    HOH (hard of hearing)    right ear; wears hearing aid   Hypertension    Infertility, female 09/01   4 SAB   Metrorrhagia    Migraine 09/01   Permanent atrial fibrillation ([place]) 04/01/2014   Personal history of chemotherapy 2003   Personal history of radiation therapy 2003   STD (sexually transmitted disease) 1976   Hx of HSV   Stroke ([place]) 01/03/15   s/p TPA R-MCA   Vitamin D deficiency 07/2006    Past Surgical History:  Procedure Laterality Date   APPENDECTOMY  1959   BREAST LUMPECTOMY Right    BREAST SURGERY Right 11/2001   lumpectomy with sentinel node biopsy X 3 all negative, ER/PR negative , chemo 6 doses, and radiation daily X 6 weeks   CARDIAC CATHETERIZATION N/A 01/16/2015   Procedure: Left Heart Cath and Coronary Angiography;  Surgeon: Belva Crome, MD; OM1 75%, RCA 30%, EF normal; spasm in RCA w/ catheter engagement    COLONOSCOPY  11/2012   diverticulitis, 2 polyps negative, repeat in 5 years   [place] Left 03/03/2017   Procedure: HALLUX RIGIDUS CORRECTION WITH COLECTOMY DEBRIDEMENT AND CAPSULAR RELEASE OF THE FIRST MPJ WITH IMPLANT LEFT FOOT;  Surgeon: Rosemary Holms, DPM;  Location: [facility];  Service: Podiatry;  Laterality: Left;   JOINT REPLACEMENT Left 02/2017   big toe   PORT-A-CATH REMOVAL     RADIOLOGY WITH ANESTHESIA N/A 01/03/2015   Procedure: RADIOLOGY WITH ANESTHESIA;  Surgeon: Medication Radiologist, MD;  Location: [facility];  Service: Radiology;  Laterality: N/A;    Current Outpatient Medications  Medication Sig Dispense Refill   acetaminophen (TYLENOL) 500 MG tablet Take 1,000 mg by mouth every 6 (six) hours as needed for moderate pain or headache. Reported on 09/05/2015     atorvastatin (LIPITOR) 40 MG tablet TAKE ONE TABLET BY MOUTH DAILY *NEED APPOINTMENT FOR REFILLS 90 tablet 3   Biotin w/ Vitamins C & E (HAIR/SKIN/NAILS PO) Take by mouth.     ELIQUIS 5 MG TABS tablet TAKE ONE TABLET BY MOUTH TWICE A DAY 60 tablet 10   Ergocalciferol (VITAMIN D2) 2000 units TABS Take 2,000 Units  by mouth daily.     ipratropium (ATROVENT) 0.03 % nasal spray Place 1 spray into both nostrils 2 (two) times daily.     levothyroxine (SYNTHROID) 50 MCG tablet TAKE ONE TABLET BY MOUTH DAILY 90 tablet 1   metoprolol succinate (TOPROL-XL) 50 MG 24 hr tablet TAKE ONE TABLET BY MOUTH DAILY 90 tablet 0   nitroGLYCERIN (NITROSTAT) 0.4 MG SL tablet Place 1 tablet (0.4 mg total) under the tongue every 5 (five) minutes as needed for chest pain. 25 tablet 3   No current facility-administered medications for this visit.    Family History  Problem Relation Age of Onset   Colon cancer Mother    Cervical cancer Mother    Osteoporosis Mother    Cancer Mother    Heart disease Father    Heart disease Brother    Breast  cancer Maternal Aunt    Cancer Maternal Aunt    Multiple births Maternal Grandmother    Multiple births Paternal Grandmother     Review of Systems  Constitutional: Negative.   Gastrointestinal:  Positive for abdominal pain.       Bloating  Genitourinary: Negative.    Exam:   BP 132/79   Pulse 62   Ht 5' 4.5" (1.638 m)   Wt 155 lb (70.3 kg)   LMP 11/08/2001 (Approximate)   BMI 26.19 kg/m   Height: 5' 4.5" (163.8 cm)  General appearance: alert, cooperative and appears stated age Breasts: right breast with well healed incisions, no masses, skin changes, LAD; left breast without masses, skin changes, nipple discharge, LAD Abdomen: soft, non-tender; bowel sounds normal; no masses,  no organomegaly Lymph nodes: Cervical, supraclavicular, and axillary nodes normal.  No abnormal inguinal nodes palpated Neurologic: Grossly normal  Pelvic: External genitalia:  no lesions              Urethra:  normal appearing urethra with no masses, tenderness or lesions              Bartholins and Skenes: normal                 Vagina: normal appearing vagina with atrophic changes and no discharge, no lesions              Cervix: no lesions              Pap taken: Yes.   Bimanual Exam:  Uterus:  normal size, contour, position, consistency, mobility, non-tender              Adnexa: normal adnexa and no mass, fullness, tenderness               Rectovaginal: Confirms               Anus:  normal sphincter tone, no lesions  Chaperone, Octaviano Batty, CMA, was present for exam.  Assessment/Plan: 1. GYN exam for high-risk Medicare patient - pap smear 03/2018.  Pap obtained today. - MMG 02/15/2020 - colonoscopy 12/2017 - BMD 02/11/2019 - lab work done with Dr. Kathyrn Lass - vaccines updated  2. Malignant neoplasm of nipple of right breast in female, unspecified estrogen receptor status ([place]) - Stage 2A triple negative, 6/03; s/p lumpectomy, chemo and radiation  3. Cervical cancer screening - Cytology -  PAP( [place]) - PR OBTAINING SCREEN PAP SMEAR  4. HSV (herpes simplex virus) anogenital infection - remote hx  5. Postmenopausal - no HRT  6. History of CVA (cerebrovascular accident)  7. History of MI (myocardial  infarction) - on chronic anticoagulation

## 2020-12-20 DIAGNOSIS — G4733 Obstructive sleep apnea (adult) (pediatric): Secondary | ICD-10-CM | POA: Diagnosis not present

## 2020-12-26 LAB — CYTOLOGY - PAP
Comment: NEGATIVE
Diagnosis: NEGATIVE
High risk HPV: NEGATIVE

## 2021-01-01 ENCOUNTER — Other Ambulatory Visit: Payer: Self-pay | Admitting: Obstetrics & Gynecology

## 2021-01-01 DIAGNOSIS — Z1231 Encounter for screening mammogram for malignant neoplasm of breast: Secondary | ICD-10-CM

## 2021-01-06 ENCOUNTER — Other Ambulatory Visit: Payer: Self-pay | Admitting: Interventional Cardiology

## 2021-01-06 DIAGNOSIS — M818 Other osteoporosis without current pathological fracture: Secondary | ICD-10-CM

## 2021-01-06 DIAGNOSIS — E782 Mixed hyperlipidemia: Secondary | ICD-10-CM

## 2021-01-06 DIAGNOSIS — I4821 Permanent atrial fibrillation: Secondary | ICD-10-CM

## 2021-01-07 ENCOUNTER — Other Ambulatory Visit: Payer: Self-pay | Admitting: Interventional Cardiology

## 2021-01-08 NOTE — Telephone Encounter (Signed)
Eliquis '5mg'$  refill request received. Patient is 70 years old, weight-70.3kg, Crea-0.80 on 10/11/2020 via [facility] form [facility] PCP, Diagnosis-Afib, and last seen by Dr. Tamala Julian on 12/30/2019 and pending appt on 03/01/2021. Dose is appropriate based on dosing criteria. Will send in refill to requested pharmacy.

## 2021-01-09 ENCOUNTER — Other Ambulatory Visit: Payer: Self-pay | Admitting: Interventional Cardiology

## 2021-01-09 DIAGNOSIS — I4821 Permanent atrial fibrillation: Secondary | ICD-10-CM

## 2021-01-09 DIAGNOSIS — M818 Other osteoporosis without current pathological fracture: Secondary | ICD-10-CM

## 2021-01-09 DIAGNOSIS — E782 Mixed hyperlipidemia: Secondary | ICD-10-CM

## 2021-01-30 DIAGNOSIS — G4733 Obstructive sleep apnea (adult) (pediatric): Secondary | ICD-10-CM | POA: Diagnosis not present

## 2021-02-20 ENCOUNTER — Other Ambulatory Visit: Payer: Self-pay

## 2021-02-20 ENCOUNTER — Ambulatory Visit
Admission: RE | Admit: 2021-02-20 | Discharge: 2021-02-20 | Disposition: A | Discharge status: Home / Self Care | Payer: Medicare HMO | Source: Ambulatory Visit | Attending: Obstetrics & Gynecology | Admitting: Obstetrics & Gynecology

## 2021-02-20 DIAGNOSIS — Z1231 Encounter for screening mammogram for malignant neoplasm of breast: Secondary | ICD-10-CM

## 2021-02-21 DIAGNOSIS — G4733 Obstructive sleep apnea (adult) (pediatric): Secondary | ICD-10-CM | POA: Diagnosis not present

## 2021-02-26 ENCOUNTER — Other Ambulatory Visit: Payer: Self-pay

## 2021-02-26 ENCOUNTER — Other Ambulatory Visit: Payer: Medicare HMO | Admitting: *Deleted

## 2021-02-26 ENCOUNTER — Telehealth: Payer: Self-pay | Admitting: *Deleted

## 2021-02-26 DIAGNOSIS — I2510 Atherosclerotic heart disease of native coronary artery without angina pectoris: Secondary | ICD-10-CM

## 2021-02-26 DIAGNOSIS — Z79899 Other long term (current) drug therapy: Secondary | ICD-10-CM

## 2021-02-26 DIAGNOSIS — Z7901 Long term (current) use of anticoagulants: Secondary | ICD-10-CM

## 2021-02-26 DIAGNOSIS — E782 Mixed hyperlipidemia: Secondary | ICD-10-CM

## 2021-02-26 DIAGNOSIS — I214 Non-ST elevation (NSTEMI) myocardial infarction: Secondary | ICD-10-CM

## 2021-02-26 DIAGNOSIS — I4821 Permanent atrial fibrillation: Secondary | ICD-10-CM

## 2021-02-26 LAB — HEPATIC FUNCTION PANEL
ALT: 24 IU/L (ref 0–32)
AST: 22 IU/L (ref 0–40)
Albumin: 4.3 g/dL (ref 3.8–4.8)
Alkaline Phosphatase: 60 IU/L (ref 44–121)
Bilirubin Total: 0.5 mg/dL (ref 0.0–1.2)
Bilirubin, Direct: 0.15 mg/dL (ref 0.00–0.40)
Total Protein: 6.8 g/dL (ref 6.0–8.5)

## 2021-02-26 LAB — LIPID PANEL
Chol/HDL Ratio: 2.2 ratio (ref 0.0–4.4)
Cholesterol, Total: 168 mg/dL (ref 100–199)
HDL: 75 mg/dL (ref >39)
LDL Chol Calc (NIH): 68 mg/dL (ref 0–99)
Triglycerides: 147 mg/dL (ref 0–149)
VLDL Cholesterol Cal: 25 mg/dL (ref 5–40)

## 2021-02-26 LAB — CBC
Hematocrit: 37.4 % (ref 34.0–46.6)
Hemoglobin: 12.4 g/dL (ref 11.1–15.9)
MCH: 33.2 pg — ABNORMAL HIGH (ref 26.6–33.0)
MCHC: 33.2 g/dL (ref 31.5–35.7)
MCV: 100 fL — ABNORMAL HIGH (ref 79–97)
Platelets: 226 10*3/uL (ref 150–450)
RBC: 3.73 x10E6/uL — ABNORMAL LOW (ref 3.77–5.28)
RDW: 12.4 % (ref 11.7–15.4)
WBC: 5.1 10*3/uL (ref 3.4–10.8)

## 2021-02-26 LAB — BASIC METABOLIC PANEL
BUN/Creatinine Ratio: 10 — ABNORMAL LOW (ref 12–28)
BUN: 8 mg/dL (ref 8–27)
CO2: 23 mmol/L (ref 20–29)
Calcium: 9.5 mg/dL (ref 8.7–10.3)
Chloride: 104 mmol/L (ref 96–106)
Creatinine, Ser: 0.77 mg/dL (ref 0.57–1.00)
Glucose: 90 mg/dL (ref 65–99)
Potassium: 4.6 mmol/L (ref 3.5–5.2)
Sodium: 142 mmol/L (ref 134–144)
eGFR: 83 mL/min/{1.73_m2} (ref >59)

## 2021-02-26 NOTE — Telephone Encounter (Signed)
Pt walked in to have her yearly labs done prior to seeing Dr Tamala Julian on 9/22. Pt confirmed she is fasting.  Spoke with Dr. Thompson Caul RN, we will order for the pt to have done a lipid, LFTs, CBC, and BMET.  Orders placed and greeter will direct the pt to lab now.

## 2021-02-28 NOTE — Progress Notes (Signed)
Cardiology Office Note:    Date:  03/01/2021   ID:  Rhonda Paul, DOB 03/20/1951, MRN [id number]  PCP:  Kathyrn Lass, MD  Cardiologist:  Sinclair Grooms, MD   Referring MD: Kathyrn Lass, MD   Chief Complaint  Patient presents with   Coronary Artery Disease   Atrial Fibrillation     History of Present Illness:    Rhonda Paul is a 70 y.o. female with a hx of  CAD with nonobstructive CAD by cath 2016 (60 to 70% obtuse marginal and 05% RCA), embolic CVA 3976, permanent atrial fibrillation, non-ST elevation myocardial infarction due to plaque rupture, and chronic anticoagulation therapy.   She is asymptomatic.  She denies dyspnea, chest pain, syncope, edema, orthopnea, claudication, bleeding from anticoagulation therapy, head trauma, melena, and hematuria.  Exertional tolerance is unchanged.  Past Medical History:  Diagnosis Date   Adenocarcinoma of breast ([place]) 06/03   RIGHT   Arthritis    Chronic anticoagulation    Eliquis   Diverticulitis 2009   diverticulitis   Heart attack ([place])    HOH (hard of hearing)    right ear; wears hearing aid   Hypertension    Infertility, female 09/01   4 SAB   Metrorrhagia    Migraine 09/01   Permanent atrial fibrillation ([place]) 04/01/2014   Personal history of chemotherapy 2003   Personal history of radiation therapy 2003   STD (sexually transmitted disease) 1976   Hx of HSV   Stroke ([place]) 01/03/15   s/p TPA R-MCA   Vitamin D deficiency 07/2006    Past Surgical History:  Procedure Laterality Date   APPENDECTOMY  1959   BREAST LUMPECTOMY Right    BREAST SURGERY Right 11/2001   lumpectomy with sentinel node biopsy X 3 all negative, ER/PR negative , chemo 6 doses, and radiation daily X 6 weeks   CARDIAC CATHETERIZATION N/A 01/16/2015   Procedure: Left Heart Cath and Coronary Angiography;  Surgeon: Belva Crome, MD; OM1 75%, RCA 30%, EF normal; spasm in RCA w/ catheter engagement    COLONOSCOPY  11/2012   diverticulitis, 2  polyps negative, repeat in 5 years   [place] Left 03/03/2017   Procedure: HALLUX RIGIDUS CORRECTION WITH COLECTOMY DEBRIDEMENT AND CAPSULAR RELEASE OF THE FIRST MPJ WITH IMPLANT LEFT FOOT;  Surgeon: Rosemary Holms, DPM;  Location: [facility];  Service: Podiatry;  Laterality: Left;   JOINT REPLACEMENT Left 02/2017   big toe   PORT-A-CATH REMOVAL     RADIOLOGY WITH ANESTHESIA N/A 01/03/2015   Procedure: RADIOLOGY WITH ANESTHESIA;  Surgeon: Medication Radiologist, MD;  Location: [facility];  Service: Radiology;  Laterality: N/A;    Current Medications: Current Meds  Medication Sig   acetaminophen (TYLENOL) 500 MG tablet Take 1,000 mg by mouth every 6 (six) hours as needed for moderate pain or headache. Reported on 09/05/2015   apixaban (ELIQUIS) 5 MG TABS tablet TAKE ONE TABLET BY MOUTH TWICE A DAY   atorvastatin (LIPITOR) 40 MG tablet TAKE ONE TABLET BY MOUTH DAILY (NEED APPOINTMENT FOR REFILLS)   Biotin w/ Vitamins C & E (HAIR/SKIN/NAILS PO) Take by mouth.   Ergocalciferol (VITAMIN D2) 2000 units TABS Take 2,000 Units by mouth daily.   ipratropium (ATROVENT) 0.03 % nasal spray Place 1 spray into both nostrils 2 (two) times daily.   levothyroxine (SYNTHROID) 50 MCG tablet TAKE ONE TABLET BY MOUTH DAILY   metoprolol succinate (TOPROL-XL) 50 MG 24 hr tablet TAKE ONE TABLET BY MOUTH  DAILY   nitroGLYCERIN (NITROSTAT) 0.4 MG SL tablet Place 1 tablet (0.4 mg total) under the tongue every 5 (five) minutes as needed for chest pain.     Allergies:   Lambs quarters, Lanolin acid [lanolin], and Other   Social History   Socioeconomic History   Marital status: Married    Spouse name: Not on file   Number of children: Not on file   Years of education: Not on file   Highest education level: Not on file  Occupational History   Not on file  Tobacco Use   Smoking status: Never   Smokeless tobacco: Never   Tobacco comments:    never used tobacco  Vaping Use   Vaping Use: Never  used  Substance and Sexual Activity   Alcohol use: Yes    Alcohol/week: 7.0 standard drinks    Types: 7 Glasses of wine per week   Drug use: No   Sexual activity: Yes    Partners: Male    Birth control/protection: Post-menopausal  Other Topics Concern   Not on file  Social History Narrative   Not on file   Social Determinants of Health   Financial Resource Strain: Not on file  Food Insecurity: Not on file  Transportation Needs: Not on file  Physical Activity: Not on file  Stress: Not on file  Social Connections: Not on file     Family History: The patient's family history includes Breast cancer in her maternal aunt; Cancer in her maternal aunt and mother; Cervical cancer in her mother; Colon cancer in her mother; Heart disease in her brother and father; Multiple births in her maternal grandmother and paternal grandmother; Osteoporosis in her mother.  ROS:   Please see the history of present illness.    No new data.  Has chronic stiff lower back.  Guarded movements today relative to her back.  All other systems reviewed and are negative.  EKGs/Labs/Other Studies Reviewed:    The following studies were reviewed today: No new data  EKG:  EKG atrial fibrillation with controlled ventricular response.  Left axis deviation.  Recent Labs: 02/26/2021: ALT 24; BUN 8; Creatinine, Ser 0.77; Hemoglobin 12.4; Platelets 226; Potassium 4.6; Sodium 142  Recent Lipid Panel    Component Value Date/Time   CHOL 168 02/26/2021 1019   TRIG 147 02/26/2021 1019   HDL 75 02/26/2021 1019   CHOLHDL 2.2 02/26/2021 1019   CHOLHDL 2.2 09/25/2015 0959   VLDL 17 09/25/2015 0959   LDLCALC 68 02/26/2021 1019    Physical Exam:    VS:  BP 122/70   Pulse 74   Ht 5' 4.5" (1.638 m)   Wt 152 lb 6.4 oz (69.1 kg)   LMP 11/08/2001 (Approximate)   SpO2 100%   BMI 25.76 kg/m     Wt Readings from Last 3 Encounters:  03/01/21 152 lb 6.4 oz (69.1 kg)  12/19/20 155 lb (70.3 kg)  12/30/19 159 lb 9.6 oz  (72.4 kg)     GEN: Healthy appearing.  Moderate alopecia/hair thinning.  No acute distress HEENT: Normal NECK: No JVD. LYMPHATICS: No lymphadenopathy CARDIAC: No murmur. IIRR no gallop, or edema. VASCULAR:  Normal Pulses. No bruits. RESPIRATORY:  Clear to auscultation without rales, wheezing or rhonchi  ABDOMEN: Soft, non-tender, non-distended, No pulsatile mass, MUSCULOSKELETAL: No deformity  SKIN: Warm and dry NEUROLOGIC:  Alert and oriented x 3 PSYCHIATRIC:  Normal affect   ASSESSMENT:    1. CAD in native artery   2. Mixed hyperlipidemia  3. Permanent atrial fibrillation ([place])   4. Chronic anticoagulation   5. Obstructive sleep apnea on CPAP    PLAN:    In order of problems listed above:  No angina.  Primary prevention including exercise, blood pressure control, LDL lowering all discussed. Continue Lipitor 40 mg/day.  Most recent LDL 68 in September 2022. Rate is controlled. Continue Eliquis therapy and monitor for bleeding.  Most recent hemoglobin and creatinine were normal. Continue CPAP.   Medication Adjustments/Labs and Tests Ordered: Current medicines are reviewed at length with the patient today.  Concerns regarding medicines are outlined above.  Orders Placed This Encounter  Procedures   EKG 12-Lead   No orders of the defined types were placed in this encounter.   There are no Patient Instructions on file for this visit.   Signed, Sinclair Grooms, MD  03/01/2021 3:57 PM    [place]

## 2021-03-01 ENCOUNTER — Other Ambulatory Visit: Payer: Self-pay

## 2021-03-01 ENCOUNTER — Encounter: Payer: Self-pay | Admitting: Interventional Cardiology

## 2021-03-01 ENCOUNTER — Ambulatory Visit: Payer: Medicare HMO | Admitting: Interventional Cardiology

## 2021-03-01 VITALS — BP 122/70 | HR 74 | Ht 64.5 in | Wt 152.4 lb

## 2021-03-01 DIAGNOSIS — Z9989 Dependence on other enabling machines and devices: Secondary | ICD-10-CM | POA: Diagnosis not present

## 2021-03-01 DIAGNOSIS — G4733 Obstructive sleep apnea (adult) (pediatric): Secondary | ICD-10-CM

## 2021-03-01 DIAGNOSIS — I4821 Permanent atrial fibrillation: Secondary | ICD-10-CM | POA: Diagnosis not present

## 2021-03-01 DIAGNOSIS — E782 Mixed hyperlipidemia: Secondary | ICD-10-CM

## 2021-03-01 DIAGNOSIS — Z7901 Long term (current) use of anticoagulants: Secondary | ICD-10-CM | POA: Diagnosis not present

## 2021-03-01 DIAGNOSIS — I2510 Atherosclerotic heart disease of native coronary artery without angina pectoris: Secondary | ICD-10-CM

## 2021-03-01 NOTE — Patient Instructions (Signed)

## 2021-03-02 DIAGNOSIS — G4733 Obstructive sleep apnea (adult) (pediatric): Secondary | ICD-10-CM | POA: Diagnosis not present

## 2021-03-05 DIAGNOSIS — L821 Other seborrheic keratosis: Secondary | ICD-10-CM | POA: Diagnosis not present

## 2021-03-17 ENCOUNTER — Other Ambulatory Visit: Payer: Self-pay | Admitting: Interventional Cardiology

## 2021-03-17 DIAGNOSIS — E559 Vitamin D deficiency, unspecified: Secondary | ICD-10-CM

## 2021-03-17 DIAGNOSIS — C50912 Malignant neoplasm of unspecified site of left female breast: Secondary | ICD-10-CM

## 2021-04-01 DIAGNOSIS — G4733 Obstructive sleep apnea (adult) (pediatric): Secondary | ICD-10-CM | POA: Diagnosis not present

## 2021-04-08 ENCOUNTER — Other Ambulatory Visit (HOSPITAL_BASED_OUTPATIENT_CLINIC_OR_DEPARTMENT_OTHER): Payer: Self-pay | Admitting: Obstetrics & Gynecology

## 2021-04-17 DIAGNOSIS — W57XXXA Bitten or stung by nonvenomous insect and other nonvenomous arthropods, initial encounter: Secondary | ICD-10-CM | POA: Diagnosis not present

## 2021-04-17 DIAGNOSIS — S20361A Insect bite (nonvenomous) of right front wall of thorax, initial encounter: Secondary | ICD-10-CM | POA: Diagnosis not present

## 2021-06-22 DIAGNOSIS — G4733 Obstructive sleep apnea (adult) (pediatric): Secondary | ICD-10-CM | POA: Diagnosis not present

## 2021-06-26 DIAGNOSIS — H524 Presbyopia: Secondary | ICD-10-CM | POA: Diagnosis not present

## 2021-06-26 DIAGNOSIS — Z0100 Encounter for examination of eyes and vision without abnormal findings: Secondary | ICD-10-CM | POA: Diagnosis not present

## 2021-07-02 DIAGNOSIS — L814 Other melanin hyperpigmentation: Secondary | ICD-10-CM | POA: Diagnosis not present

## 2021-07-02 DIAGNOSIS — L821 Other seborrheic keratosis: Secondary | ICD-10-CM | POA: Diagnosis not present

## 2021-07-02 DIAGNOSIS — M7130 Other bursal cyst, unspecified site: Secondary | ICD-10-CM | POA: Diagnosis not present

## 2021-07-02 DIAGNOSIS — D1801 Hemangioma of skin and subcutaneous tissue: Secondary | ICD-10-CM | POA: Diagnosis not present

## 2021-07-06 ENCOUNTER — Other Ambulatory Visit: Payer: Self-pay | Admitting: Interventional Cardiology

## 2021-07-06 DIAGNOSIS — M818 Other osteoporosis without current pathological fracture: Secondary | ICD-10-CM

## 2021-07-06 DIAGNOSIS — I4821 Permanent atrial fibrillation: Secondary | ICD-10-CM

## 2021-07-06 DIAGNOSIS — E782 Mixed hyperlipidemia: Secondary | ICD-10-CM

## 2021-07-06 NOTE — Telephone Encounter (Signed)
Prescription refill request for Eliquis received. Indication: Afib  Last office visit:03/01/21 Tamala Julian)  Scr: 0.77 (02/26/21)  Age: 71 Weight: 69.1kg  Appropriate dose and refill sent to requested pharmacy.

## 2021-07-23 DIAGNOSIS — G4733 Obstructive sleep apnea (adult) (pediatric): Secondary | ICD-10-CM | POA: Diagnosis not present

## 2021-08-20 DIAGNOSIS — G4733 Obstructive sleep apnea (adult) (pediatric): Secondary | ICD-10-CM | POA: Diagnosis not present

## 2021-09-04 ENCOUNTER — Other Ambulatory Visit (HOSPITAL_BASED_OUTPATIENT_CLINIC_OR_DEPARTMENT_OTHER): Payer: Self-pay | Admitting: Obstetrics & Gynecology

## 2021-09-04 NOTE — Telephone Encounter (Signed)
LMOVM for pt to call office regarding refill request 

## 2021-09-20 DIAGNOSIS — G4733 Obstructive sleep apnea (adult) (pediatric): Secondary | ICD-10-CM | POA: Diagnosis not present

## 2021-10-22 DIAGNOSIS — I480 Paroxysmal atrial fibrillation: Secondary | ICD-10-CM | POA: Diagnosis not present

## 2021-10-22 DIAGNOSIS — E039 Hypothyroidism, unspecified: Secondary | ICD-10-CM | POA: Diagnosis not present

## 2021-10-22 DIAGNOSIS — G4733 Obstructive sleep apnea (adult) (pediatric): Secondary | ICD-10-CM | POA: Diagnosis not present

## 2021-10-22 DIAGNOSIS — Z8673 Personal history of transient ischemic attack (TIA), and cerebral infarction without residual deficits: Secondary | ICD-10-CM | POA: Diagnosis not present

## 2021-10-22 DIAGNOSIS — D6869 Other thrombophilia: Secondary | ICD-10-CM | POA: Diagnosis not present

## 2021-10-22 DIAGNOSIS — M533 Sacrococcygeal disorders, not elsewhere classified: Secondary | ICD-10-CM | POA: Diagnosis not present

## 2021-10-22 DIAGNOSIS — Z0000 Encounter for general adult medical examination without abnormal findings: Secondary | ICD-10-CM | POA: Diagnosis not present

## 2021-10-22 DIAGNOSIS — Z1389 Encounter for screening for other disorder: Secondary | ICD-10-CM | POA: Diagnosis not present

## 2021-10-22 DIAGNOSIS — Z6824 Body mass index (BMI) 24.0-24.9, adult: Secondary | ICD-10-CM | POA: Diagnosis not present

## 2021-10-22 DIAGNOSIS — I700 Atherosclerosis of aorta: Secondary | ICD-10-CM | POA: Diagnosis not present

## 2021-10-22 DIAGNOSIS — M542 Cervicalgia: Secondary | ICD-10-CM | POA: Diagnosis not present

## 2021-12-19 DIAGNOSIS — G4733 Obstructive sleep apnea (adult) (pediatric): Secondary | ICD-10-CM | POA: Diagnosis not present

## 2021-12-20 ENCOUNTER — Encounter (HOSPITAL_BASED_OUTPATIENT_CLINIC_OR_DEPARTMENT_OTHER): Payer: Self-pay | Admitting: Obstetrics & Gynecology

## 2021-12-20 ENCOUNTER — Ambulatory Visit (INDEPENDENT_AMBULATORY_CARE_PROVIDER_SITE_OTHER): Payer: Medicare HMO | Admitting: Obstetrics & Gynecology

## 2021-12-20 VITALS — BP 123/74 | HR 66 | Ht 64.5 in | Wt 148.8 lb

## 2021-12-20 DIAGNOSIS — Z780 Asymptomatic menopausal state: Secondary | ICD-10-CM

## 2021-12-20 DIAGNOSIS — A609 Anogenital herpesviral infection, unspecified: Secondary | ICD-10-CM

## 2021-12-20 DIAGNOSIS — R69 Illness, unspecified: Secondary | ICD-10-CM | POA: Diagnosis not present

## 2021-12-20 DIAGNOSIS — Z9189 Other specified personal risk factors, not elsewhere classified: Secondary | ICD-10-CM

## 2021-12-20 DIAGNOSIS — Z853 Personal history of malignant neoplasm of breast: Secondary | ICD-10-CM

## 2021-12-20 DIAGNOSIS — C50011 Malignant neoplasm of nipple and areola, right female breast: Secondary | ICD-10-CM

## 2021-12-20 NOTE — Progress Notes (Signed)
71 y.o. [id number] Married White or Caucasian female here for breast and pelvic exam.  I am also following her for h/o HSV and breast cancer hx.  Denies vaginal bleeding.  Denies vaginal bleeding.  Patient's last menstrual period was 11/08/2001 (approximate).          Sexually active: No.  H/O STD:  h/o HSV  Health Maintenance: PCP:  Dr. Kathyrn Lass.  Last wellness appt was in the fall of 2022.  Did blood work at that appt:  yes Vaccines are up to date:  yes Colonoscopy:  12/24/2017, follow up 5 years MMG:  02/20/2021 Negative BMD:  02/11/2019 Last pap smear:  12/19/2020 Negative.     reports that she has never smoked. She has never used smokeless tobacco. She reports current alcohol use of about 7.0 standard drinks of alcohol per week. She reports that she does not use drugs.  Past Medical History:  Diagnosis Date   Adenocarcinoma of breast ([place]) 06/03   RIGHT   Arthritis    Chronic anticoagulation    Eliquis   Diverticulitis 2009   diverticulitis   Heart attack ([place])    HOH (hard of hearing)    right ear; wears hearing aid   Hypertension    Infertility, female 09/01   4 SAB   Metrorrhagia    Migraine 09/01   Permanent atrial fibrillation ([place]) 04/01/2014   Personal history of chemotherapy 2003   Personal history of radiation therapy 2003   STD (sexually transmitted disease) 1976   Hx of HSV   Stroke ([place]) 01/03/15   s/p TPA R-MCA   Vitamin D deficiency 07/2006    Past Surgical History:  Procedure Laterality Date   APPENDECTOMY  1959   BREAST LUMPECTOMY Right    BREAST SURGERY Right 11/2001   lumpectomy with sentinel node biopsy X 3 all negative, ER/PR negative , chemo 6 doses, and radiation daily X 6 weeks   CARDIAC CATHETERIZATION N/A 01/16/2015   Procedure: Left Heart Cath and Coronary Angiography;  Surgeon: Belva Crome, MD; OM1 75%, RCA 30%, EF normal; spasm in RCA w/ catheter engagement    COLONOSCOPY  11/2012   diverticulitis, 2 polyps negative, repeat in 5 years    [place] Left 03/03/2017   Procedure: HALLUX RIGIDUS CORRECTION WITH COLECTOMY DEBRIDEMENT AND CAPSULAR RELEASE OF THE FIRST MPJ WITH IMPLANT LEFT FOOT;  Surgeon: Rosemary Holms, DPM;  Location: [facility];  Service: Podiatry;  Laterality: Left;   JOINT REPLACEMENT Left 02/2017   big toe   PORT-A-CATH REMOVAL     RADIOLOGY WITH ANESTHESIA N/A 01/03/2015   Procedure: RADIOLOGY WITH ANESTHESIA;  Surgeon: Medication Radiologist, MD;  Location: [facility];  Service: Radiology;  Laterality: N/A;    Current Outpatient Medications  Medication Sig Dispense Refill   acetaminophen (TYLENOL) 500 MG tablet Take 1,000 mg by mouth every 6 (six) hours as needed for moderate pain or headache. Reported on 09/05/2015     atorvastatin (LIPITOR) 40 MG tablet TAKE ONE TABLET BY MOUTH DAILY (NEED APPOINTMENT FOR REFILLS) 90 tablet 3   Biotin w/ Vitamins C & E (HAIR/SKIN/NAILS PO) Take by mouth.     ELIQUIS 5 MG TABS tablet TAKE ONE TABLET BY MOUTH TWICE A DAY 60 tablet 5   ipratropium (ATROVENT) 0.03 % nasal spray Place 1 spray into both nostrils 2 (two) times daily.     levothyroxine (SYNTHROID) 50 MCG tablet TAKE ONE TABLET BY MOUTH DAILY 90 tablet 0   metoprolol succinate (TOPROL-XL)  50 MG 24 hr tablet TAKE ONE TABLET BY MOUTH DAILY 90 tablet 3   nitroGLYCERIN (NITROSTAT) 0.4 MG SL tablet Place 1 tablet (0.4 mg total) under the tongue every 5 (five) minutes as needed for chest pain. 25 tablet 3   No current facility-administered medications for this visit.    Family History  Problem Relation Age of Onset   Colon cancer Mother    Cervical cancer Mother    Osteoporosis Mother    Cancer Mother    Heart disease Father    Heart disease Brother    Breast cancer Maternal Aunt    Cancer Maternal Aunt    Multiple births Maternal Grandmother    Multiple births Paternal Grandmother     Review of Systems  Constitutional: Negative.   Genitourinary: Negative.     Exam:   BP 123/74 (BP  Location: Right Arm, Patient Position: Sitting, Cuff Size: Normal)   Pulse 66   Ht 5' 4.5" (1.638 m) Comment: reported  Wt 148 lb 12.8 oz (67.5 kg)   LMP 11/08/2001 (Approximate)   BMI 25.15 kg/m   Height: 5' 4.5" (163.8 cm) (reported)  General appearance: alert, cooperative and appears stated age Breasts: normal appearance, no masses or tenderness Abdomen: soft, non-tender; bowel sounds normal; no masses,  no organomegaly Lymph nodes: Cervical, supraclavicular, and axillary nodes normal.  No abnormal inguinal nodes palpated Neurologic: Grossly normal  Pelvic: External genitalia:  no lesions              Urethra:  normal appearing urethra with no masses, tenderness or lesions              Bartholins and Skenes: normal                 Vagina: normal appearing vagina with atrophic changes and no discharge, no lesions              Cervix: no lesions              Pap taken: No. Bimanual Exam:  Uterus:  normal size, contour, position, consistency, mobility, non-tender              Adnexa: normal adnexa and no mass, fullness, tenderness               Rectovaginal: Confirms               Anus:  normal sphincter tone, no lesions  Chaperone, Octaviano Batty, CMA, was present for exam.  Assessment/Plan: 1. GYN exam for high-risk Medicare patient -  pap smear 12/2020.  Not indicated today. - MMG 02/2021 - Colonoscopy 7/19, follow up 5 years - BMD 02/2019 - Vaccines reviewed/they are updated - lab work done with Dr. Kathyrn Lass   2. Malignant neoplasm of nipple of right breast in female, unspecified estrogen receptor status ([place])  3. HSV (herpes simplex virus) anogenital infection - not on antiviral.  No recent symptoms  4. Postmenopausal - no HRT

## 2022-01-02 ENCOUNTER — Other Ambulatory Visit: Payer: Self-pay | Admitting: Interventional Cardiology

## 2022-01-02 ENCOUNTER — Other Ambulatory Visit (HOSPITAL_BASED_OUTPATIENT_CLINIC_OR_DEPARTMENT_OTHER): Payer: Self-pay | Admitting: Obstetrics & Gynecology

## 2022-01-02 DIAGNOSIS — M818 Other osteoporosis without current pathological fracture: Secondary | ICD-10-CM

## 2022-01-02 DIAGNOSIS — E782 Mixed hyperlipidemia: Secondary | ICD-10-CM

## 2022-01-02 DIAGNOSIS — I4821 Permanent atrial fibrillation: Secondary | ICD-10-CM

## 2022-01-02 DIAGNOSIS — E039 Hypothyroidism, unspecified: Secondary | ICD-10-CM

## 2022-01-02 NOTE — Telephone Encounter (Signed)
Prescription refill request for Eliquis received.  Indication: afib  Last office visit: Tamala [facility] 03/01/2021 Scr:  0.77, 02/26/2021 Age: 71 yo  Weight: 67.5 kg   Refill sent.

## 2022-01-04 ENCOUNTER — Other Ambulatory Visit (HOSPITAL_BASED_OUTPATIENT_CLINIC_OR_DEPARTMENT_OTHER): Payer: Medicare HMO

## 2022-01-04 ENCOUNTER — Other Ambulatory Visit (HOSPITAL_BASED_OUTPATIENT_CLINIC_OR_DEPARTMENT_OTHER): Payer: Self-pay | Admitting: *Deleted

## 2022-01-04 DIAGNOSIS — E039 Hypothyroidism, unspecified: Secondary | ICD-10-CM

## 2022-01-05 LAB — TSH: TSH: 2.77 u[IU]/mL (ref 0.450–4.500)

## 2022-01-05 LAB — T4, FREE: Free T4: 1.37 ng/dL (ref 0.82–1.77)

## 2022-01-11 ENCOUNTER — Other Ambulatory Visit: Payer: Self-pay

## 2022-01-11 MED ORDER — ATORVASTATIN CALCIUM 40 MG PO TABS: ORAL_TABLET | ORAL | 0 refills | Status: DC

## 2022-01-18 ENCOUNTER — Other Ambulatory Visit: Payer: Self-pay | Admitting: Family Medicine

## 2022-01-18 DIAGNOSIS — Z1231 Encounter for screening mammogram for malignant neoplasm of breast: Secondary | ICD-10-CM

## 2022-01-19 DIAGNOSIS — G4733 Obstructive sleep apnea (adult) (pediatric): Secondary | ICD-10-CM | POA: Diagnosis not present

## 2022-02-19 DIAGNOSIS — G4733 Obstructive sleep apnea (adult) (pediatric): Secondary | ICD-10-CM | POA: Diagnosis not present

## 2022-02-20 DIAGNOSIS — G4733 Obstructive sleep apnea (adult) (pediatric): Secondary | ICD-10-CM | POA: Diagnosis not present

## 2022-02-22 ENCOUNTER — Ambulatory Visit
Admission: RE | Admit: 2022-02-22 | Discharge: 2022-02-22 | Disposition: A | Discharge status: Home / Self Care | Payer: Medicare HMO | Source: Ambulatory Visit | Attending: Family Medicine | Admitting: Family Medicine

## 2022-02-22 DIAGNOSIS — Z1231 Encounter for screening mammogram for malignant neoplasm of breast: Secondary | ICD-10-CM | POA: Diagnosis not present

## 2022-03-18 ENCOUNTER — Telehealth: Payer: Self-pay | Admitting: Interventional Cardiology

## 2022-03-18 DIAGNOSIS — E782 Mixed hyperlipidemia: Secondary | ICD-10-CM

## 2022-03-18 DIAGNOSIS — Z79899 Other long term (current) drug therapy: Secondary | ICD-10-CM

## 2022-03-18 NOTE — Telephone Encounter (Signed)
Left message for patient to callback to schedule lab appointment.   Dr. Tamala [facility] would like to order a fasting lipid and liver panel.   Liver and lipid panel ordered. Need to schedule lab appointment when patient calls back.

## 2022-03-18 NOTE — Telephone Encounter (Signed)
Patient called to get orders for lab test prior to her annual follow-up visit.

## 2022-03-19 DIAGNOSIS — G4733 Obstructive sleep apnea (adult) (pediatric): Secondary | ICD-10-CM | POA: Diagnosis not present

## 2022-03-20 ENCOUNTER — Ambulatory Visit: Payer: Medicare HMO | Attending: Interventional Cardiology

## 2022-03-20 DIAGNOSIS — E782 Mixed hyperlipidemia: Secondary | ICD-10-CM | POA: Diagnosis not present

## 2022-03-20 DIAGNOSIS — Z79899 Other long term (current) drug therapy: Secondary | ICD-10-CM

## 2022-03-20 LAB — HEPATIC FUNCTION PANEL
ALT: 28 IU/L (ref 0–32)
AST: 24 IU/L (ref 0–40)
Albumin: 4.7 g/dL (ref 3.9–4.9)
Alkaline Phosphatase: 65 IU/L (ref 44–121)
Bilirubin Total: 0.8 mg/dL (ref 0.0–1.2)
Bilirubin, Direct: 0.21 mg/dL (ref 0.00–0.40)
Total Protein: 7.6 g/dL (ref 6.0–8.5)

## 2022-03-20 LAB — LIPID PANEL
Chol/HDL Ratio: 2.3 ratio (ref 0.0–4.4)
Cholesterol, Total: 193 mg/dL (ref 100–199)
HDL: 83 mg/dL (ref >39)
LDL Chol Calc (NIH): 87 mg/dL (ref 0–99)
Triglycerides: 134 mg/dL (ref 0–149)
VLDL Cholesterol Cal: 23 mg/dL (ref 5–40)

## 2022-03-22 ENCOUNTER — Telehealth: Payer: Self-pay | Admitting: Interventional Cardiology

## 2022-03-22 NOTE — Telephone Encounter (Signed)
Patient states she is returning a call, but does not know who called or what it was regarding. 

## 2022-03-25 ENCOUNTER — Encounter: Payer: Self-pay | Admitting: Interventional Cardiology

## 2022-03-25 ENCOUNTER — Ambulatory Visit: Payer: Medicare HMO | Attending: Interventional Cardiology | Admitting: Interventional Cardiology

## 2022-03-25 ENCOUNTER — Other Ambulatory Visit: Payer: Self-pay | Admitting: Interventional Cardiology

## 2022-03-25 VITALS — BP 130/78 | HR 65 | Ht 64.5 in | Wt 151.4 lb

## 2022-03-25 DIAGNOSIS — R011 Cardiac murmur, unspecified: Secondary | ICD-10-CM | POA: Diagnosis not present

## 2022-03-25 DIAGNOSIS — Z7901 Long term (current) use of anticoagulants: Secondary | ICD-10-CM | POA: Diagnosis not present

## 2022-03-25 DIAGNOSIS — I4821 Permanent atrial fibrillation: Secondary | ICD-10-CM | POA: Diagnosis not present

## 2022-03-25 DIAGNOSIS — E782 Mixed hyperlipidemia: Secondary | ICD-10-CM | POA: Diagnosis not present

## 2022-03-25 DIAGNOSIS — C50912 Malignant neoplasm of unspecified site of left female breast: Secondary | ICD-10-CM

## 2022-03-25 DIAGNOSIS — Z79899 Other long term (current) drug therapy: Secondary | ICD-10-CM | POA: Diagnosis not present

## 2022-03-25 DIAGNOSIS — I2510 Atherosclerotic heart disease of native coronary artery without angina pectoris: Secondary | ICD-10-CM | POA: Diagnosis not present

## 2022-03-25 DIAGNOSIS — E559 Vitamin D deficiency, unspecified: Secondary | ICD-10-CM

## 2022-03-25 MED ORDER — NITROGLYCERIN 0.4 MG SL SUBL: 0.4000 mg | SUBLINGUAL_TABLET | SUBLINGUAL | 3 refills | Status: AC | PRN

## 2022-03-25 MED ORDER — EZETIMIBE 10 MG PO TABS: 10.0000 mg | ORAL_TABLET | Freq: Every day | ORAL | 3 refills | Status: DC

## 2022-03-25 MED ORDER — METOPROLOL SUCCINATE ER 50 MG PO TB24: 50.0000 mg | ORAL_TABLET | Freq: Every day | ORAL | 3 refills | Status: DC

## 2022-03-25 MED ORDER — ATORVASTATIN CALCIUM 40 MG PO TABS: ORAL_TABLET | ORAL | 3 refills | Status: DC

## 2022-03-25 NOTE — Patient Instructions (Signed)
Medication Instructions:  Your physician has recommended you make the following change in your medication:   1) START ezetimibe (Zetia) '10mg'$  daily  *If you need a refill on your cardiac medications before your next appointment, please call your pharmacy*  Lab Work: In 1 month: fasting Lipid panel If you have labs (blood work) drawn today and your tests are completely normal, you will receive your results only by: [place] (if you have MyChart) OR A paper copy in the mail If you have any lab test that is abnormal or we need to change your treatment, we will call you to review the results.  Testing/Procedures: Your physician has requested that you have an echocardiogram. Echocardiography is a painless test that uses sound waves to create images of your heart. It provides your doctor with information about the size and shape of your heart and how well your heart's chambers and valves are working. This procedure takes approximately one hour. There are no restrictions for this procedure. Please do NOT wear cologne, perfume, aftershave, or lotions (deodorant is allowed). Please arrive 15 minutes prior to your appointment time.  Follow-Up: At [facility], you and your health needs are our priority.  As part of our continuing mission to provide you with exceptional heart care, we have created designated Provider Care Teams.  These Care Teams include your primary Cardiologist (physician) and Advanced Practice Providers (APPs -  Physician Assistants and Nurse Practitioners) who all work together to provide you with the care you need, when you need it.  Your next appointment:   1 year(s)  The format for your next appointment:   In Person  Provider:   Rudean Haskell, MD or Gwyndolyn Kaufman, MD or Candee Furbish, MD   Important Information About Sugar

## 2022-03-25 NOTE — H&P (View-Only) (Signed)
Cardiology Office Note:    Date:  04/10/2022   ID:  Rhonda Paul, DOB 1951-02-20, MRN [id number]  PCP:  Kathyrn Lass, MD  Cardiologist:  Sinclair Grooms, MD   Referring MD: Kathyrn Lass, MD   Chief Complaint  Patient presents with   Atrial Fibrillation   Hyperlipidemia   Follow-up    History of Present Illness:    Rhonda Paul is a 71 y.o. female with a hx of  CAD with nonobstructive CAD by cath 2016 (60 to 70% obtuse marginal and 31% RCA), embolic CVA 5176, permanent atrial fibrillation, non-ST elevation myocardial infarction due to plaque rupture, and chronic anticoagulation therapy.   Rhonda Paul has no complaints.  She has not had lower extremity swelling and also denies orthopnea, PND, and exertional dyspnea.  No bleeding on apixaban.  She denies recurrent neurological complaints.  She is physically active without limitations.  No recent falls or head trauma.  Past Medical History:  Diagnosis Date   Adenocarcinoma of breast ([place]) 06/03   RIGHT   Arthritis    Chronic anticoagulation    Eliquis   Diverticulitis 2009   diverticulitis   Heart attack ([place])    HOH (hard of hearing)    right ear; wears hearing aid   Hypertension    Infertility, female 09/01   4 SAB   Metrorrhagia    Migraine 09/01   Permanent atrial fibrillation ([place]) 04/01/2014   Personal history of chemotherapy 2003   Personal history of radiation therapy 2003   STD (sexually transmitted disease) 1976   Hx of HSV   Stroke ([place]) 01/03/15   s/p TPA R-MCA   Vitamin D deficiency 07/2006    Past Surgical History:  Procedure Laterality Date   APPENDECTOMY  1959   BREAST LUMPECTOMY Right    BREAST SURGERY Right 11/2001   lumpectomy with sentinel node biopsy X 3 all negative, ER/PR negative , chemo 6 doses, and radiation daily X 6 weeks   CARDIAC CATHETERIZATION N/A 01/16/2015   Procedure: Left Heart Cath and Coronary Angiography;  Surgeon: Belva Crome, MD; OM1 75%, RCA 30%, EF normal; spasm in  RCA w/ catheter engagement    COLONOSCOPY  11/2012   diverticulitis, 2 polyps negative, repeat in 5 years   [place] Left 03/03/2017   Procedure: HALLUX RIGIDUS CORRECTION WITH COLECTOMY DEBRIDEMENT AND CAPSULAR RELEASE OF THE FIRST MPJ WITH IMPLANT LEFT FOOT;  Surgeon: Rosemary Holms, DPM;  Location: [facility];  Service: Podiatry;  Laterality: Left;   JOINT REPLACEMENT Left 02/2017   big toe   PORT-A-CATH REMOVAL     RADIOLOGY WITH ANESTHESIA N/A 01/03/2015   Procedure: RADIOLOGY WITH ANESTHESIA;  Surgeon: Medication Radiologist, MD;  Location: [facility];  Service: Radiology;  Laterality: N/A;    Current Medications: Current Meds  Medication Sig   acetaminophen (TYLENOL) 500 MG tablet Take 1,000 mg by mouth every 6 (six) hours as needed for moderate pain or headache. Reported on 09/05/2015   apixaban (ELIQUIS) 5 MG TABS tablet TAKE ONE TABLET BY MOUTH TWICE A DAY   Biotin w/ Vitamins C & E (HAIR/SKIN/NAILS PO) Take by mouth.   ezetimibe (ZETIA) 10 MG tablet Take 1 tablet (10 mg total) by mouth daily.   ipratropium (ATROVENT) 0.03 % nasal spray Place 1 spray into both nostrils 2 (two) times daily.   levothyroxine (SYNTHROID) 50 MCG tablet TAKE ONE TABLET BY MOUTH DAILY   [DISCONTINUED] atorvastatin (LIPITOR) 40 MG tablet TAKE ONE TABLET BY  MOUTH DAILY (NEED APPOINTMENT FOR REFILLS)   [DISCONTINUED] metoprolol succinate (TOPROL-XL) 50 MG 24 hr tablet TAKE ONE TABLET BY MOUTH DAILY   [DISCONTINUED] nitroGLYCERIN (NITROSTAT) 0.4 MG SL tablet Place 1 tablet (0.4 mg total) under the tongue every 5 (five) minutes as needed for chest pain.     Allergies:   Lambs quarters, Lanolin acid [lanolin], and Other   Social History   Socioeconomic History   Marital status: Married    Spouse name: Not on file   Number of children: Not on file   Years of education: Not on file   Highest education level: Not on file  Occupational History   Not on file  Tobacco Use   Smoking status:  Never   Smokeless tobacco: Never   Tobacco comments:    never used tobacco  Vaping Use   Vaping Use: Never used  Substance and Sexual Activity   Alcohol use: Yes    Alcohol/week: 7.0 standard drinks of alcohol    Types: 7 Glasses of wine per week   Drug use: No   Sexual activity: Yes    Partners: Male    Birth control/protection: Post-menopausal  Other Topics Concern   Not on file  Social History Narrative   Not on file   Social Determinants of Health   Financial Resource Strain: Not on file  Food Insecurity: Not on file  Transportation Needs: Not on file  Physical Activity: Not on file  Stress: Not on file  Social Connections: Not on file     Family History: The patient's family history includes Breast cancer in her maternal aunt; Cancer in her maternal aunt and mother; Cervical cancer in her mother; Colon cancer in her mother; Heart disease in her brother and father; Multiple births in her maternal grandmother and paternal grandmother; Osteoporosis in her mother.  ROS:   Please see the history of present illness.     all other systems reviewed and are negative.  EKGs/Labs/Other Studies Reviewed:    The following studies were reviewed today: Last echocardiogram was performed in 2016.  There was no significant mitral or tricuspid regurgitation at that time.  EKG:  EKG the tracing reveals atrial fibrillation, controlled ventricular response, relatively low voltage.  She has left axis deviation.  There is no change when compared to prior tracings.  Recent Labs: 01/04/2022: TSH 2.770 03/20/2022: ALT 28  Recent Lipid Panel    Component Value Date/Time   CHOL 193 03/20/2022 0859   TRIG 134 03/20/2022 0859   HDL 83 03/20/2022 0859   CHOLHDL 2.3 03/20/2022 0859   CHOLHDL 2.2 09/25/2015 0959   VLDL 17 09/25/2015 0959   LDLCALC 87 03/20/2022 0859    Physical Exam:    VS:  BP 130/78   Pulse 65   Ht 5' 4.5" (1.638 m)   Wt 151 lb 6.4 oz (68.7 kg)   LMP 11/08/2001  (Approximate)   SpO2 100%   BMI 25.59 kg/m     Wt Readings from Last 3 Encounters:  03/25/22 151 lb 6.4 oz (68.7 kg)  12/20/21 148 lb 12.8 oz (67.5 kg)  03/01/21 152 lb 6.4 oz (69.1 kg)     GEN: Healthy appearing wearing a mask. No acute distress HEENT: Normal NECK: No JVD. LYMPHATICS: No lymphadenopathy CARDIAC: Left lower parasternal holosystolic murmur. IIRR no gallop, or edema. VASCULAR:  Normal Pulses. No bruits. RESPIRATORY:  Clear to auscultation without rales, wheezing or rhonchi  ABDOMEN: Soft, non-tender, non-distended, No pulsatile mass, MUSCULOSKELETAL: No deformity  SKIN: Warm and dry NEUROLOGIC:  Alert and oriented x 3 PSYCHIATRIC:  Normal affect   ASSESSMENT:    1. Chronic anticoagulation   2. Permanent atrial fibrillation ([place])   3. Mixed hyperlipidemia   4. CAD in native artery   5. Medication management   6. Systolic murmur    PLAN:    In order of problems listed above:  Apixaban therapy without bleeding or complications. Rate control is adequate on current therapy.  Current agent being used is metoprolol XL 50 mg/day.  We will continue to monitor to determine if dose needs to be decreased in intensity.  We will get 2D Doppler echocardiogram to assess systolic function as well as atrial size and tricuspid/mitral valve regurgitation.  If LV function is going down, this may be an opportunity to start preoperative therapy for systolic dysfunction. Continue atorvastatin 40 mg/day. Continue risk factor modification No comment other than as noted above Determine significance of mitral regurgitation by echo.   Overall education and awareness concerning primary risk prevention was discussed in detail: LDL less than 70, hemoglobin A1c less than 7, blood pressure target less than 130/80 mmHg, >150 minutes of moderate aerobic activity per week, avoidance of smoking, weight control (via diet and exercise), and continued surveillance/management of/for obstructive  sleep apnea.   New primary cardiologist on return appointment in 6 to 9 months.  2D Doppler echocardiogram to assess current LV function and rule out developing significant tricuspid and mitral regurgitation problems.  Medication Adjustments/Labs and Tests Ordered: Current medicines are reviewed at length with the patient today.  Concerns regarding medicines are outlined above.  Orders Placed This Encounter  Procedures   Lipid panel   EKG 12-Lead   ECHOCARDIOGRAM COMPLETE   Meds ordered this encounter  Medications   atorvastatin (LIPITOR) 40 MG tablet    Sig: TAKE ONE TABLET BY MOUTH DAILY (NEED APPOINTMENT FOR REFILLS)    Dispense:  90 tablet    Refill:  3   metoprolol succinate (TOPROL-XL) 50 MG 24 hr tablet    Sig: Take 1 tablet (50 mg total) by mouth daily. Take with or immediately following a meal.    Dispense:  90 tablet    Refill:  3   nitroGLYCERIN (NITROSTAT) 0.4 MG SL tablet    Sig: Place 1 tablet (0.4 mg total) under the tongue every 5 (five) minutes as needed for chest pain.    Dispense:  25 tablet    Refill:  3   ezetimibe (ZETIA) 10 MG tablet    Sig: Take 1 tablet (10 mg total) by mouth daily.    Dispense:  90 tablet    Refill:  3    Patient Instructions  Medication Instructions:  Your physician has recommended you make the following change in your medication:   1) START ezetimibe (Zetia) '10mg'$  daily  *If you need a refill on your cardiac medications before your next appointment, please call your pharmacy*  Lab Work: In 1 month: fasting Lipid panel If you have labs (blood work) drawn today and your tests are completely normal, you will receive your results only by: [place] (if you have MyChart) OR A paper copy in the mail If you have any lab test that is abnormal or we need to change your treatment, we will call you to review the results.  Testing/Procedures: Your physician has requested that you have an echocardiogram. Echocardiography is a  painless test that uses sound waves to create images of your heart. It provides  your doctor with information about the size and shape of your heart and how well your heart's chambers and valves are working. This procedure takes approximately one hour. There are no restrictions for this procedure. Please do NOT wear cologne, perfume, aftershave, or lotions (deodorant is allowed). Please arrive 15 minutes prior to your appointment time.  Follow-Up: At [facility], you and your health needs are our priority.  As part of our continuing mission to provide you with exceptional heart care, we have created designated Provider Care Teams.  These Care Teams include your primary Cardiologist (physician) and Advanced Practice Providers (APPs -  Physician Assistants and Nurse Practitioners) who all work together to provide you with the care you need, when you need it.  Your next appointment:   1 year(s)  The format for your next appointment:   In Person  Provider:   Rudean Haskell, MD or Gwyndolyn Kaufman, MD or Candee Furbish, MD   Important Information About Sugar         Signed, Sinclair Grooms, MD  04/10/2022 4:55 PM    [place]

## 2022-03-25 NOTE — Progress Notes (Addendum)
Cardiology Office Note:    Date:  04/10/2022   ID:  THEODORE RAHRIG, DOB 1950-10-26, MRN [id number]  PCP:  Kathyrn Lass, MD  Cardiologist:  Sinclair Grooms, MD   Referring MD: Kathyrn Lass, MD   Chief Complaint  Patient presents with   Atrial Fibrillation   Hyperlipidemia   Follow-up    History of Present Illness:    Rhonda Paul is a 71 y.o. 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   Quarry has no complaints.  She has not had lower extremity swelling and also denies orthopnea, PND, and exertional dyspnea.  No bleeding on apixaban.  She denies recurrent neurological complaints.  She is physically active without limitations.  No recent falls or head trauma.  Past Medical History:  Diagnosis Date   Adenocarcinoma of breast ([place]) 06/03   RIGHT   Arthritis    Chronic anticoagulation    Eliquis   Diverticulitis 2009   diverticulitis   Heart attack ([place])    HOH (hard of hearing)    right ear; wears hearing aid   Hypertension    Infertility, female 09/01   4 SAB   Metrorrhagia    Migraine 09/01   Permanent atrial fibrillation ([place]) 04/01/2014   Personal history of chemotherapy 2003   Personal history of radiation therapy 2003   STD (sexually transmitted disease) 1976   Hx of HSV   Stroke ([place]) 01/03/15   s/p TPA R-MCA   Vitamin D deficiency 07/2006    Past Surgical History:  Procedure Laterality Date   APPENDECTOMY  1959   BREAST LUMPECTOMY Right    BREAST SURGERY Right 11/2001   lumpectomy with sentinel node biopsy X 3 all negative, ER/PR negative , chemo 6 doses, and radiation daily X 6 weeks   CARDIAC CATHETERIZATION N/A 01/16/2015   Procedure: Left Heart Cath and Coronary Angiography;  Surgeon: Belva Crome, MD; OM1 75%, RCA 30%, EF normal; spasm in  RCA w/ catheter engagement    COLONOSCOPY  11/2012   diverticulitis, 2 polyps negative, repeat in 5 years   [place] Left 03/03/2017   Procedure: HALLUX RIGIDUS CORRECTION WITH COLECTOMY DEBRIDEMENT AND CAPSULAR RELEASE OF THE FIRST MPJ WITH IMPLANT LEFT FOOT;  Surgeon: Rosemary Holms, DPM;  Location: [facility];  Service: Podiatry;  Laterality: Left;   JOINT REPLACEMENT Left 02/2017   big toe   PORT-A-CATH REMOVAL     RADIOLOGY WITH ANESTHESIA N/A 01/03/2015   Procedure: RADIOLOGY WITH ANESTHESIA;  Surgeon: Medication Radiologist, MD;  Location: [facility];  Service: Radiology;  Laterality: N/A;    Current Medications: Current Meds  Medication Sig   acetaminophen (TYLENOL) 500 MG tablet Take 1,000 mg by mouth every 6 (six) hours as needed for moderate pain or headache. Reported on 09/05/2015   apixaban (ELIQUIS) 5 MG TABS tablet TAKE ONE TABLET BY MOUTH TWICE A DAY   Biotin w/ Vitamins C & E (HAIR/SKIN/NAILS PO) Take by mouth.   ezetimibe (ZETIA) 10 MG tablet Take 1 tablet (10 mg total) by mouth daily.   ipratropium (ATROVENT) 0.03 % nasal spray Place 1 spray into both nostrils 2 (two) times daily.   levothyroxine (SYNTHROID) 50 MCG tablet TAKE ONE TABLET BY MOUTH DAILY   [DISCONTINUED] atorvastatin (LIPITOR) 40 MG tablet TAKE ONE TABLET BY  MOUTH DAILY (NEED APPOINTMENT FOR REFILLS)   [DISCONTINUED] metoprolol succinate (TOPROL-XL) 50 MG 24 hr tablet TAKE ONE TABLET BY MOUTH DAILY   [DISCONTINUED] nitroGLYCERIN (NITROSTAT) 0.4 MG SL tablet Place 1 tablet (0.4 mg total) under the tongue every 5 (five) minutes as needed for chest pain.     Allergies:   [place]amb[place], Lanolin acid [lanolin], and Other   Social History   Socioeconomic History   Marital status: Married    Spouse name: Not on file   Number of children: Not on file   Years of education: Not on file   Highest education level: Not on file  Occupational History   Not on file  Tobacco Use   Smoking status:  Never   Smokeless tobacco: Never   Tobacco comments:    never used tobacco  Vaping Use   Vaping Use: Never used  Substance and Sexual Activity   Alcohol use: Yes    Alcohol/week: 7.0 standard drinks of alcohol    Types: 7 Glasses of wine per week   Drug use: No   Sexual activity: Yes    Partners: Male    Birth control/protection: Post-menopausal  Other Topics Concern   Not on file  Social History Narrative   Not on file   Social Determinants of Health   Financial Resource Strain: Not on file  Food Insecurity: Not on file  Transportation Needs: Not on file  Physical Activity: Not on file  Stress: Not on file  Social Connections: Not on file     Family History: The patient's family history includes Breast cancer in her maternal aunt; Cancer in her maternal aunt and mother; Cervical cancer in her mother; Colon cancer in her mother; Heart disease in her brother and father; Multiple births in her maternal grandmother and paternal grandmother; Osteoporosis in her mother.  ROS:   Please see the history of present illness.     all other systems reviewed and are negative.  EKGs/Labs/Other Studies Reviewed:    The following studies were reviewed today: Last echocardiogram was performed in 2016.  There was no significant mitral or tricuspid regurgitation at that time.  EKG:  EKG the tracing reveals atrial fibrillation, controlled ventricular response, relatively low voltage.  She has left axis deviation.  There is no change when compared to prior tracings.  Recent Labs: 01/04/2022: TSH 2.770 03/20/2022: ALT 28  Recent Lipid Panel    Component Value Date/Time   CHOL 193 03/20/2022 0859   TRIG 134 03/20/2022 0859   HDL 83 03/20/2022 0859   CHOLHDL 2.3 03/20/2022 0859   CHOLHDL 2.2 09/25/2015 0959   VLDL 17 09/25/2015 0959   LDLCALC 87 03/20/2022 0859    Physical Exam:    VS:  BP 130/78   Pulse 65   Ht 5' 4.5" (1.638 m)   Wt 151 lb 6.4 oz (68.7 kg)   LMP 11/08/2001  (Approximate)   SpO2 100%   BMI 25.59 kg/m     Wt Readings from Last 3 Encounters:  03/25/22 151 lb 6.4 oz (68.7 kg)  12/20/21 148 lb 12.8 oz (67.5 kg)  03/01/21 152 lb 6.4 oz (69.1 kg)     GEN: Healthy appearing wearing a mask. No acute distress HEENT: Normal NECK: No JVD. LYMPHATICS: No lymphadenopathy CARDIAC: Left lower parasternal holosystolic murmur. IIRR no gallop, or edema. VASCULAR:  Normal Pulses. No bruits. RESPIRATORY:  Clear to auscultation without rales, wheezing or rhonchi  ABDOMEN: Soft, non-tender, non-distended, No pulsatile mass, MUSCULOSKELETAL: No deformity  SKIN: Warm and dry NEUROLOGIC:  Alert and oriented x 3 PSYCHIATRIC:  Normal affect   ASSESSMENT:    1. Chronic anticoagulation   2. Permanent atrial fibrillation ([place])   3. Mixed hyperlipidemia   4. CAD in native artery   5. Medication management   6. Systolic murmur    PLAN:    In order of problems listed above:  Apixaban therapy without bleeding or complications. Rate control is adequate on current therapy.  Current agent being used is metoprolol XL 50 mg/day.  We will continue to monitor to determine if dose needs to be decreased in intensity.  We will get 2D Doppler echocardiogram to assess systolic function as well as atrial size and tricuspid/mitral valve regurgitation.  If LV function is going down, this may be an opportunity to start preoperative therapy for systolic dysfunction. Continue atorvastatin 40 mg/day. Continue risk factor modification No comment other than as noted above Determine significance of mitral regurgitation by echo.   Overall education and awareness concerning primary risk prevention was discussed in detail: LDL less than 70, hemoglobin A1c less than 7, blood pressure target less than 130/80 mmHg, >150 minutes of moderate aerobic activity per week, avoidance of smoking, weight control (via diet and exercise), and continued surveillance/management of/for obstructive  sleep apnea.   New primary cardiologist on return appointment in 6 to 9 months.  2D Doppler echocardiogram to assess current LV function and rule out developing significant tricuspid and mitral regurgitation problems.  Medication Adjustments/Labs and Tests Ordered: Current medicines are reviewed at length with the patient today.  Concerns regarding medicines are outlined above.  Orders Placed This Encounter  Procedures   Lipid panel   EKG 12-Lead   ECHOCARDIOGRAM COMPLETE   Meds ordered this encounter  Medications   atorvastatin (LIPITOR) 40 MG tablet    Sig: TAKE ONE TABLET BY MOUTH DAILY (NEED APPOINTMENT FOR REFILLS)    Dispense:  90 tablet    Refill:  3   metoprolol succinate (TOPROL-XL) 50 MG 24 hr tablet    Sig: Take 1 tablet (50 mg total) by mouth daily. Take with or immediately following a meal.    Dispense:  90 tablet    Refill:  3   nitroGLYCERIN (NITROSTAT) 0.4 MG SL tablet    Sig: Place 1 tablet (0.4 mg total) under the tongue every 5 (five) minutes as needed for chest pain.    Dispense:  25 tablet    Refill:  3   ezetimibe (ZETIA) 10 MG tablet    Sig: Take 1 tablet (10 mg total) by mouth daily.    Dispense:  90 tablet    Refill:  3    Patient Instructions  Medication Instructions:  Your physician has recommended you make the following change in your medication:   1) START ezetimibe (Zetia) '10mg'$  daily  *If you need a refill on your cardiac medications before your next appointment, please call your pharmacy*  Lab Work: In 1 month: fasting Lipid panel If you have labs (blood work) drawn today and your tests are completely normal, you will receive your results only by: [place] (if you have MyChart) OR A paper copy in the mail If you have any lab test that is abnormal or we need to change your treatment, we will call you to review the results.  Testing/Procedures: Your physician has requested that you have an echocardiogram. Echocardiography is a  painless test that uses sound waves to create images of your heart. It provides  your doctor with information about the size and shape of your heart and how well your heart's chambers and valves are working. This procedure takes approximately one hour. There are no restrictions for this procedure. Please do NOT wear cologne, perfume, aftershave, or lotions (deodorant is allowed). Please arrive 15 minutes prior to your appointment time.  Follow-Up: At [facility], you and your health needs are our priority.  As part of our continuing mission to provide you with exceptional heart care, we have created designated Provider Care Teams.  These Care Teams include your primary Cardiologist (physician) and Advanced Practice Providers (APPs -  Physician Assistants and Nurse Practitioners) who all work together to provide you with the care you need, when you need it.  Your next appointment:   1 year(s)  The format for your next appointment:   In Person  Provider:   Rudean Haskell, MD or Gwyndolyn Kaufman, MD or Candee Furbish, MD   Important Information About Sugar         Signed, Sinclair Grooms, MD  04/10/2022 4:55 PM    [place]

## 2022-03-25 NOTE — Telephone Encounter (Signed)
Patient seen in office today. 

## 2022-04-10 ENCOUNTER — Ambulatory Visit (HOSPITAL_COMMUNITY): Payer: Medicare HMO | Attending: Interventional Cardiology

## 2022-04-10 DIAGNOSIS — I4821 Permanent atrial fibrillation: Secondary | ICD-10-CM | POA: Insufficient documentation

## 2022-04-10 LAB — ECHOCARDIOGRAM COMPLETE
Area-P 1/2: 4.36 cm2
S' Lateral: 2.8 cm

## 2022-04-11 ENCOUNTER — Telehealth: Payer: Self-pay | Admitting: Interventional Cardiology

## 2022-04-11 MED ORDER — SACUBITRIL-VALSARTAN 24-26 MG PO TABS: 1.0000 | ORAL_TABLET | Freq: Two times a day (BID) | ORAL | 11 refills | Status: DC

## 2022-04-11 NOTE — Telephone Encounter (Signed)
Returned call to patient to discuss echo results.  Per Dr. Tamala Julian: Let the patient know code demonstrates that the heart function is starting to decrease and both the mitral and tricuspid valve are leaking.  The mitral regurgitation is severe.  She needs to be scheduled for transesophageal echo to assess this.  I would also like for her to be started on Entresto 24/26 mg p.o. twice daily.  Basic metabolic panel 1 week later.  Be happy to discuss this with her if she has objections or significant concerns.    TEE is scheduled for 04/18/22 at 12:00PM with Dr. Gasper Sells. Case #: [id number].  Instructions reviewed with patient and sent via MyChart message.  Entresto 24-'26mg'$  BID sent to pharmacy with free 30-day trial coupon attached to pharmacy note.  Patient will have BMET (and CBC) drawn on 04/18/22 prior to TEE.  Patient verbalized understanding of the above.

## 2022-04-11 NOTE — Telephone Encounter (Signed)
Patient is requesting a call back to review echo results.

## 2022-04-18 ENCOUNTER — Ambulatory Visit (HOSPITAL_BASED_OUTPATIENT_CLINIC_OR_DEPARTMENT_OTHER): Payer: Medicare HMO | Admitting: Anesthesiology

## 2022-04-18 ENCOUNTER — Ambulatory Visit (HOSPITAL_BASED_OUTPATIENT_CLINIC_OR_DEPARTMENT_OTHER)
Admission: RE | Admit: 2022-04-18 | Discharge: 2022-04-18 | Disposition: A | Discharge status: Home / Self Care | Payer: Medicare HMO | Source: Ambulatory Visit | Attending: Internal Medicine | Admitting: Internal Medicine

## 2022-04-18 ENCOUNTER — Other Ambulatory Visit: Payer: Self-pay

## 2022-04-18 ENCOUNTER — Ambulatory Visit (HOSPITAL_COMMUNITY)
Admission: RE | Admit: 2022-04-18 | Discharge: 2022-04-18 | Disposition: A | Discharge status: Home / Self Care | Payer: Medicare HMO | Source: Ambulatory Visit | Attending: Internal Medicine | Admitting: Internal Medicine

## 2022-04-18 ENCOUNTER — Ambulatory Visit (HOSPITAL_COMMUNITY): Payer: Medicare HMO | Admitting: Anesthesiology

## 2022-04-18 ENCOUNTER — Encounter (HOSPITAL_COMMUNITY)
Admission: RE | Disposition: A | Discharge status: Home / Self Care | Payer: Medicare HMO | Source: Ambulatory Visit | Attending: Internal Medicine

## 2022-04-18 ENCOUNTER — Encounter (HOSPITAL_COMMUNITY): Payer: Self-pay | Admitting: Internal Medicine

## 2022-04-18 DIAGNOSIS — I252 Old myocardial infarction: Secondary | ICD-10-CM | POA: Diagnosis not present

## 2022-04-18 DIAGNOSIS — M1990 Unspecified osteoarthritis, unspecified site: Secondary | ICD-10-CM | POA: Diagnosis not present

## 2022-04-18 DIAGNOSIS — I361 Nonrheumatic tricuspid (valve) insufficiency: Secondary | ICD-10-CM

## 2022-04-18 DIAGNOSIS — I2510 Atherosclerotic heart disease of native coronary artery without angina pectoris: Secondary | ICD-10-CM

## 2022-04-18 DIAGNOSIS — R011 Cardiac murmur, unspecified: Secondary | ICD-10-CM | POA: Insufficient documentation

## 2022-04-18 DIAGNOSIS — I340 Nonrheumatic mitral (valve) insufficiency: Secondary | ICD-10-CM

## 2022-04-18 DIAGNOSIS — Z7901 Long term (current) use of anticoagulants: Secondary | ICD-10-CM | POA: Insufficient documentation

## 2022-04-18 DIAGNOSIS — I10 Essential (primary) hypertension: Secondary | ICD-10-CM

## 2022-04-18 DIAGNOSIS — E782 Mixed hyperlipidemia: Secondary | ICD-10-CM | POA: Diagnosis not present

## 2022-04-18 DIAGNOSIS — I081 Rheumatic disorders of both mitral and tricuspid valves: Secondary | ICD-10-CM

## 2022-04-18 DIAGNOSIS — I088 Other rheumatic multiple valve diseases: Secondary | ICD-10-CM | POA: Diagnosis not present

## 2022-04-18 DIAGNOSIS — I4821 Permanent atrial fibrillation: Secondary | ICD-10-CM | POA: Diagnosis not present

## 2022-04-18 DIAGNOSIS — Z79899 Other long term (current) drug therapy: Secondary | ICD-10-CM | POA: Diagnosis not present

## 2022-04-18 DIAGNOSIS — I4891 Unspecified atrial fibrillation: Secondary | ICD-10-CM | POA: Diagnosis not present

## 2022-04-18 HISTORY — PX: TEE WITHOUT CARDIOVERSION: SHX5443

## 2022-04-18 HISTORY — PX: BUBBLE STUDY: SHX6837

## 2022-04-18 SURGERY — ECHOCARDIOGRAM, TRANSESOPHAGEAL
Anesthesia: Monitor Anesthesia Care

## 2022-04-18 MED ORDER — PROPOFOL 500 MG/50ML IV EMUL
INTRAVENOUS | Status: DC | PRN
  Administered 2022-04-18: 150 ug/kg/min via INTRAVENOUS

## 2022-04-18 MED ORDER — SODIUM CHLORIDE 0.9 % IV SOLN: INTRAVENOUS | Status: DC

## 2022-04-18 MED ORDER — LIDOCAINE 2% (20 MG/ML) 5 ML SYRINGE
INTRAMUSCULAR | Status: DC | PRN
  Administered 2022-04-18: 100 mg via INTRAVENOUS

## 2022-04-18 MED ORDER — PROPOFOL 10 MG/ML IV BOLUS
INTRAVENOUS | Status: DC | PRN
  Administered 2022-04-18 (×5): 30 mg via INTRAVENOUS

## 2022-04-18 MED ORDER — PHENYLEPHRINE 80 MCG/ML (10ML) SYRINGE FOR IV PUSH (FOR BLOOD PRESSURE SUPPORT)
PREFILLED_SYRINGE | INTRAVENOUS | Status: DC | PRN
  Administered 2022-04-18 (×2): 160 ug via INTRAVENOUS

## 2022-04-18 NOTE — CV Procedure (Signed)
     TRANSESOPHAGEAL ECHOCARDIOGRAM   NAME:  VALORIE MCGRORY    MRN: [id number] DOB:  Jun 19, 1950    ADMIT DATE: 04/18/2022  INDICATIONS:  Mitral regurgitation   PROCEDURE:   Informed consent was obtained prior to the procedure. The risks, benefits and alternatives for the procedure were discussed and the patient comprehended these risks.  Risks include, but are not limited to, cough, sore throat, vomiting, nausea, somnolence, esophageal and stomach trauma or perforation, bleeding, low blood pressure, aspiration, pneumonia, infection, trauma to the teeth and death.    Procedural time out performed. The oropharynx was anesthetized with topical 1% benzocaine.    Anesthesia was administered by anesthesia  The patient's heart rate, blood pressure, and oxygen saturation are monitored continuously during the procedure.   The transesophageal probe was inserted in the esophagus and stomach without difficulty and multiple views were obtained.   COMPLICATIONS:    There were no immediate complications.  KEY FINDINGS:  Moderate to mod-severe mitral regurgitation at most. Heart failure with mildly reduced ejection fraction.  Full report to follow. Further management per primary team.   Rudean Haskell, MD [place]  1:02 PM

## 2022-04-18 NOTE — Discharge Instructions (Signed)

## 2022-04-18 NOTE — Anesthesia Postprocedure Evaluation (Signed)
Anesthesia Post Note  Patient: Rhonda Paul  Procedure(s) Performed: TRANSESOPHAGEAL ECHOCARDIOGRAM (TEE) BUBBLE STUDY     Patient location during evaluation: PACU Anesthesia Type: MAC Level of consciousness: awake and alert Pain management: pain level controlled Vital Signs Assessment: post-procedure vital signs reviewed and stable Respiratory status: spontaneous breathing, nonlabored ventilation, respiratory function stable and patient connected to nasal cannula oxygen Cardiovascular status: stable and blood pressure returned to baseline Postop Assessment: no apparent nausea or vomiting Anesthetic complications: no   No notable events documented.  Last Vitals:  Vitals:   04/18/22 1250 04/18/22 1300  BP: (!) 97/56 (!) 94/58  Pulse: 81 83  Resp: 15 16  Temp:    SpO2: 100% 95%    Last Pain:  Vitals:   04/18/22 1250  TempSrc:   PainSc: 0-No pain                 Ilze Roselli

## 2022-04-18 NOTE — Transfer of Care (Signed)
Immediate Anesthesia Transfer of Care Note  Patient: Rhonda Paul  Procedure(s) Performed: TRANSESOPHAGEAL ECHOCARDIOGRAM (TEE) BUBBLE STUDY  Patient Location: Endoscopy Unit  Anesthesia Type:MAC  Level of Consciousness: awake and alert   Airway & Oxygen Therapy: Patient Spontanous Breathing  Post-op Assessment: Report given to RN and Post -op Vital signs reviewed and stable  Post vital signs: Reviewed and stable  Last Vitals:  Vitals Value Taken Time  BP 100/56   Temp    Pulse 78 04/18/22 1247  Resp 18 04/18/22 1247  SpO2 100 % 04/18/22 1247  Vitals shown include unvalidated device data.  Last Pain:  Vitals:   04/18/22 1127  TempSrc: Temporal  PainSc: 0-No pain         Complications: No notable events documented.

## 2022-04-18 NOTE — Progress Notes (Signed)
  Echocardiogram Echocardiogram Transesophageal has been performed.  Rhonda Paul 04/18/2022, 1:12 PM

## 2022-04-18 NOTE — Anesthesia Preprocedure Evaluation (Addendum)
Anesthesia Evaluation  Patient identified by MRN, date of birth, ID band Patient awake    Reviewed: Allergy & Precautions, NPO status , Patient's Chart, lab work & pertinent test results, reviewed documented beta blocker date and time   Airway Mallampati: III  TM Distance: <3 FB Neck ROM: Full   Comment: Small mouth Dental no notable dental hx. (+) Teeth Intact, Dental Advisory Given, Caps   Pulmonary    Pulmonary exam normal breath sounds clear to auscultation       Cardiovascular hypertension, Pt. on medications and Pt. on home beta blockers + CAD and + Past MI  Normal cardiovascular exam+ dysrhythmias Atrial Fibrillation + Valvular Problems/Murmurs  Rhythm:Regular Rate:Normal  ECHO 11/23 1. Severe mitral regurgitation. Central jet related to restricted PMVL. The mitral valve is abnormal. Severe mitral valve regurgitation. No evidence of mitral stenosis.  2. Left ventricular ejection fraction, by estimation, is 40 to 45%. The left ventricle has mildly decreased function. The left ventricle demonstrates global hypokinesis. Left ventricular diastolic function could not be evaluated.  3. Right ventricular systolic function is mildly reduced. The right ventricular size is normal. There is mildly elevated pulmonary artery systolic pressure. The estimated right ventricular systolic pressure is 41.3 mmHg.  4. Left atrial size was severely dilated.  5. The aortic valve is tricuspid. Aortic valve regurgitation is not visualized. No aortic stenosis is present.  6. The inferior vena cava is normal in size with greater than 50% respiratory variability, suggesting right atrial pressure of 3 mmHg.    Neuro/Psych  Headaches CVA, No Residual Symptoms    GI/Hepatic negative GI ROS, Neg liver ROS,,,  Endo/Other  negative endocrine ROS    Renal/GU negative Renal ROS     Musculoskeletal  (+) Arthritis , Osteoarthritis,    Abdominal   Peds   Hematology   Anesthesia Other Findings   Reproductive/Obstetrics                             Anesthesia Physical Anesthesia Plan  ASA: 3  Anesthesia Plan: MAC   Post-op Pain Management: Minimal or no pain anticipated   Induction: Intravenous  PONV Risk Score and Plan: 2 and Propofol infusion  Airway Management Planned: Simple Face Mask and Natural Airway  Additional Equipment: None  Intra-op Plan:   Post-operative Plan:   Informed Consent: I have reviewed the patients History and Physical, chart, labs and discussed the procedure including the risks, benefits and alternatives for the proposed anesthesia with the patient or authorized representative who has indicated his/her understanding and acceptance.       Plan Discussed with: CRNA and Anesthesiologist  Anesthesia Plan Comments:         Anesthesia Quick Evaluation

## 2022-04-18 NOTE — Interval H&P Note (Signed)
History and Physical Interval Note:  04/18/2022 11:59 AM  Rhonda Paul  has presented today for surgery, with the diagnosis of SEVERE MITRAL REGURGITATION.  The various methods of treatment have been discussed with the patient and family. After consideration of risks, benefits and other options for treatment, the patient has consented to  Procedure(s): TRANSESOPHAGEAL ECHOCARDIOGRAM (TEE) (N/A) as a surgical intervention.  The patient's history has been reviewed, patient examined, no change in status, stable for surgery.  I have reviewed the patient's chart and labs.  Questions were answered to the patient's satisfaction.     Deni Berti A Zakye Baby

## 2022-04-19 DIAGNOSIS — G4733 Obstructive sleep apnea (adult) (pediatric): Secondary | ICD-10-CM | POA: Diagnosis not present

## 2022-04-21 ENCOUNTER — Encounter (HOSPITAL_COMMUNITY): Payer: Self-pay | Admitting: Internal Medicine

## 2022-04-26 ENCOUNTER — Telehealth: Payer: Self-pay | Admitting: Interventional Cardiology

## 2022-04-26 ENCOUNTER — Ambulatory Visit: Payer: Medicare HMO | Attending: Interventional Cardiology

## 2022-04-26 DIAGNOSIS — E782 Mixed hyperlipidemia: Secondary | ICD-10-CM | POA: Diagnosis not present

## 2022-04-26 DIAGNOSIS — Z79899 Other long term (current) drug therapy: Secondary | ICD-10-CM

## 2022-04-26 NOTE — Telephone Encounter (Signed)
Left message for patient to call back  

## 2022-04-26 NOTE — Telephone Encounter (Signed)
Pt c/o medication issue:  1. Name of Medication:    2. How are you currently taking this medication (dosage and times per day)?    3. Are you having a reaction (difficulty breathing--STAT)? no  4. What is your medication issue? Patient states with the new medication that was added,  she believes its make her bp low. Her bp is 93/62 Please advise

## 2022-04-27 LAB — LIPID PANEL
Chol/HDL Ratio: 2.1 ratio (ref 0.0–4.4)
Cholesterol, Total: 156 mg/dL (ref 100–199)
HDL: 73 mg/dL (ref >39)
LDL Chol Calc (NIH): 64 mg/dL (ref 0–99)
Triglycerides: 104 mg/dL (ref 0–149)
VLDL Cholesterol Cal: 19 mg/dL (ref 5–40)

## 2022-04-29 MED ORDER — ENTRESTO 24-26 MG PO TABS: 0.5000 | ORAL_TABLET | Freq: Two times a day (BID) | ORAL | Status: DC

## 2022-04-29 NOTE — Telephone Encounter (Signed)
See phone note on 04/26/2022.

## 2022-04-29 NOTE — Telephone Encounter (Signed)
Returned call to patient.  Per Dr. Tamala Julian: The mitral regurgitation is moderate. We should continue medical therapy. No intervention needed now. Valve leak is due to longstanding atrial fibrillation. Decrease Entresto to 1/2 tablet BID if possible or one tablet at night.  OV if we can find one is okay.   Patient states she stopped taking Entresto on Friday afternoon (04/26/22) due to low BP and dizziness. She reports BP came up and dizziness subsided by Saturday, and this morning BP 106/71, HR 70.  Patient states she will try taking a half tablet of Entresto BID and monitor BP. She will update [place] if she is unable to tolerate this dose.  Med list updated.  Patient verbalized understanding of the above and expressed appreciation for call.

## 2022-04-29 NOTE — Telephone Encounter (Signed)
Patient is retuning call. Please advise

## 2022-05-06 ENCOUNTER — Other Ambulatory Visit: Payer: Self-pay

## 2022-05-06 DIAGNOSIS — I4821 Permanent atrial fibrillation: Secondary | ICD-10-CM

## 2022-05-06 DIAGNOSIS — M818 Other osteoporosis without current pathological fracture: Secondary | ICD-10-CM

## 2022-05-06 DIAGNOSIS — E782 Mixed hyperlipidemia: Secondary | ICD-10-CM

## 2022-05-06 MED ORDER — APIXABAN 5 MG PO TABS: 5.0000 mg | ORAL_TABLET | Freq: Two times a day (BID) | ORAL | 5 refills | Status: DC

## 2022-05-06 NOTE — Telephone Encounter (Signed)
Prescription refill request for Eliquis received. Indication:afib Last office visit:10/23 Scr:0.7 Age: 71 Weight:68.7  kg  Prescription refilled

## 2022-05-08 ENCOUNTER — Ambulatory Visit: Payer: Medicare HMO | Admitting: Interventional Cardiology

## 2022-05-09 ENCOUNTER — Telehealth: Payer: Self-pay | Admitting: Interventional Cardiology

## 2022-05-09 NOTE — Telephone Encounter (Signed)
  Pt is calling, she requesting to get a call back from [employer]

## 2022-05-09 NOTE — Telephone Encounter (Signed)
Received incoming call from patient.  Patient reports she started taking Entresto 1/2 tablet twice daily on 05/05/22 as directed. She states her BP has steadily decreased since Sunday, this morning her BP was 93/66 and she states she does feel a little bit of dizziness when she bends over.  Patient would like to know if she should stop taking this medication due to it causing low BP/dizziness.  Will forward to Dr. Tamala Julian to review and advise.

## 2022-05-09 NOTE — Telephone Encounter (Signed)
Left message for patient to call back  

## 2022-05-11 NOTE — Telephone Encounter (Signed)
Yes, stop Entresto and place in allergies contraindication as intolerance due to low BP

## 2022-05-13 NOTE — Telephone Encounter (Signed)
Spoke with patient and informed her of Dr. Thompson Caul recommendation:  Yes, stop Entresto and place in allergies contraindication as intolerance due to low BP     Medication list updated, Entresto added to allergy list as intolerance.  Patient verbalized understanding and expressed appreciation for follow-up.

## 2022-05-19 DIAGNOSIS — G4733 Obstructive sleep apnea (adult) (pediatric): Secondary | ICD-10-CM | POA: Diagnosis not present

## 2022-06-17 DIAGNOSIS — G4733 Obstructive sleep apnea (adult) (pediatric): Secondary | ICD-10-CM | POA: Diagnosis not present

## 2022-07-05 DIAGNOSIS — H524 Presbyopia: Secondary | ICD-10-CM | POA: Diagnosis not present

## 2022-07-24 DIAGNOSIS — L819 Disorder of pigmentation, unspecified: Secondary | ICD-10-CM | POA: Diagnosis not present

## 2022-07-24 DIAGNOSIS — L820 Inflamed seborrheic keratosis: Secondary | ICD-10-CM | POA: Diagnosis not present

## 2022-07-24 DIAGNOSIS — D225 Melanocytic nevi of trunk: Secondary | ICD-10-CM | POA: Diagnosis not present

## 2022-07-24 DIAGNOSIS — D2261 Melanocytic nevi of right upper limb, including shoulder: Secondary | ICD-10-CM | POA: Diagnosis not present

## 2022-07-24 DIAGNOSIS — L814 Other melanin hyperpigmentation: Secondary | ICD-10-CM | POA: Diagnosis not present

## 2022-07-24 DIAGNOSIS — L570 Actinic keratosis: Secondary | ICD-10-CM | POA: Diagnosis not present

## 2022-07-24 DIAGNOSIS — L821 Other seborrheic keratosis: Secondary | ICD-10-CM | POA: Diagnosis not present

## 2022-08-20 NOTE — Progress Notes (Unsigned)
Cardiology Office Note:    Date:  08/21/2022   ID:  Rhonda Paul, DOB 1950/07/13, MRN [id number]  PCP:  Kathyrn Lass, MD   [place] Providers Cardiologist:  Sinclair Grooms, MD (Inactive) {  Referring MD: Kathyrn Lass, MD    History of Present Illness:    Rhonda Paul is a 72 y.o. female with a hx of CAD with nonobstructive disease by cath in [id number], embolic CVA, permanent Afib, chronic systolic HF with mildly reduced EF, and moderate MR who was previously followed by Dr. Tamala Julian who now presents to clinic for follow-up.  Was last seen in clinic on 03/2022 where she was doing well. TTE 04/2022 with LVEF 40-45% with severe MR with restricted PMVL, mild RV systolic dysfunction, mildly elevated PASP. Follow-up TEE with more moderate MR with EF 45-50%. She was continued on medical management.  Today, the patient overall feels well. No chest pain, SOB, orthopnea, LE edema, PND, or palpitations. She is compliant with her CPAP. Blood pressure is well controlled at home 115-120/80s. No symptoms with her Afib and is tolerating her apixaban as prescribed. Discussed her echo results at length today. Notably, did not tolerate entresto due to hypotension.  Past Medical History:  Diagnosis Date   Adenocarcinoma of breast ([place]) 06/03   RIGHT   Arthritis    Chronic anticoagulation    Eliquis   Diverticulitis 2009   diverticulitis   Heart attack ([place])    HOH (hard of hearing)    right ear; wears hearing aid   Hypertension    Infertility, female 09/01   4 SAB   Metrorrhagia    Migraine 09/01   Permanent atrial fibrillation ([place]) 04/01/2014   Personal history of chemotherapy 2003   Personal history of radiation therapy 2003   STD (sexually transmitted disease) 1976   Hx of HSV   Stroke ([place]) 01/03/15   s/p TPA R-MCA   Vitamin D deficiency 07/2006    Past Surgical History:  Procedure Laterality Date   APPENDECTOMY  1959   BREAST LUMPECTOMY Right    BREAST SURGERY  Right 11/2001   lumpectomy with sentinel node biopsy X 3 all negative, ER/PR negative , chemo 6 doses, and radiation daily X 6 weeks   BUBBLE STUDY  04/18/2022   Procedure: BUBBLE STUDY;  Surgeon: Werner Lean, MD;  Location: [facility];  Service: Cardiovascular;;   CARDIAC CATHETERIZATION N/A 01/16/2015   Procedure: Left Heart Cath and Coronary Angiography;  Surgeon: Belva Crome, MD; OM1 75%, RCA 30%, EF normal; spasm in RCA w/ catheter engagement    COLONOSCOPY  11/2012   diverticulitis, 2 polyps negative, repeat in 5 years   [place] Left 03/03/2017   Procedure: HALLUX RIGIDUS CORRECTION WITH COLECTOMY DEBRIDEMENT AND CAPSULAR RELEASE OF THE FIRST MPJ WITH IMPLANT LEFT FOOT;  Surgeon: Rosemary Holms, DPM;  Location: [facility];  Service: Podiatry;  Laterality: Left;   JOINT REPLACEMENT Left 02/2017   big toe   PORT-A-CATH REMOVAL     RADIOLOGY WITH ANESTHESIA N/A 01/03/2015   Procedure: RADIOLOGY WITH ANESTHESIA;  Surgeon: Medication Radiologist, MD;  Location: [facility];  Service: Radiology;  Laterality: N/A;   TEE WITHOUT CARDIOVERSION N/A 04/18/2022   Procedure: TRANSESOPHAGEAL ECHOCARDIOGRAM (TEE);  Surgeon: Werner Lean, MD;  Location[facility] ENDOSCOPY;  Service: Cardiovascular;  Laterality: N/A;    Current Medications: Current Meds  Medication Sig   apixaban (ELIQUIS) 5 MG TABS tablet Take 1 tablet (5 mg total)  by mouth 2 (two) times daily.   atorvastatin (LIPITOR) 40 MG tablet TAKE ONE TABLET BY MOUTH DAILY (NEED APPOINTMENT FOR REFILLS)   Biotin w/ Vitamins C & E (HAIR/SKIN/NAILS PO) Take 1 capsule by mouth daily.   Coenzyme Q10 (COQ-10) 100 MG capsule Take 100 mg by mouth daily.   dapagliflozin propanediol (FARXIGA) 10 MG TABS tablet Take 1 tablet (10 mg total) by mouth daily before breakfast.   dapagliflozin propanediol (FARXIGA) 10 MG TABS tablet Take 1 tablet (10 mg total) by mouth daily before breakfast.   ezetimibe (ZETIA) 10 MG tablet Take  1 tablet (10 mg total) by mouth daily.   ipratropium (ATROVENT) 0.03 % nasal spray Place 1 spray into both nostrils daily.   levothyroxine (SYNTHROID) 50 MCG tablet TAKE ONE TABLET BY MOUTH DAILY   metoprolol succinate (TOPROL-XL) 50 MG 24 hr tablet Take 1 tablet (50 mg total) by mouth daily. Take with or immediately following a meal.   nitroGLYCERIN (NITROSTAT) 0.4 MG SL tablet Place 1 tablet (0.4 mg total) under the tongue every 5 (five) minutes as needed for chest pain.   spironolactone (ALDACTONE) 25 MG tablet Take 0.5 tablets (12.5 mg total) by mouth daily.     Allergies:   Lambs quarters, Entresto [sacubitril-valsartan], Lanolin acid [lanolin], and Other   Social History   Socioeconomic History   Marital status: Married    Spouse name: Not on file   Number of children: Not on file   Years of education: Not on file   Highest education level: Not on file  Occupational History   Not on file  Tobacco Use   Smoking status: Never   Smokeless tobacco: Never   Tobacco comments:    never used tobacco  Vaping Use   Vaping Use: Never used  Substance and Sexual Activity   Alcohol use: Yes    Alcohol/week: 7.0 standard drinks of alcohol    Types: 7 Glasses of wine per week   Drug use: No   Sexual activity: Yes    Partners: Male    Birth control/protection: Post-menopausal  Other Topics Concern   Not on file  Social History Narrative   Not on file   Social Determinants of Health   Financial Resource Strain: Not on file  Food Insecurity: Not on file  Transportation Needs: Not on file  Physical Activity: Not on file  Stress: Not on file  Social Connections: Not on file     Family History: The patient's family history includes Breast cancer in her maternal aunt; Cancer in her maternal aunt and mother; Cervical cancer in her mother; Colon cancer in her mother; Heart disease in her brother and father; Multiple births in her maternal grandmother and paternal grandmother;  Osteoporosis in her mother.  ROS:   Please see the history of present illness.     All other systems reviewed and are negative.  EKGs/Labs/Other Studies Reviewed:    The following studies were reviewed today: Cath 2016: 1st Mrg lesion, 75% stenosed. Prox RCA lesion, 30% stenosed. The left ventricular systolic function is normal.   Moderately severe first obtuse marginal stenosis. The marginal is a relatively small vessel in distribution and diameter. This likely represents the culprit for the patient's presentation. 30% proximal RCA narrowing with more severe narrowing on catheter engagement due to spasm. The spasm was relieved with intracoronary nitroglycerin. Widely patent LAD Normal LV function   Recommendations:   Sublingual nitroglycerin for chest discomfort Consider low-dose long-acting nitrates if recurring episodes of  chest discomfort. Eligible for discharge in a.m. assuming no complications.   TEE 04/2022: IMPRESSIONS     1. Likely moderate mitral regurgitation. Mild anterior mitral valve  prolapse There are multiple jets; functional MR. Pulmonary vein flow  blunting without reversal. No significant blood pressure variability.  Mitral valve anatomy is suitable for  interventions. Cumulative vena contraca area ~ 0.42 (moderate to severe).  The mitral valve is abnormal. Moderate mitral valve regurgitation. No  evidence of mitral stenosis.   2. Left ventricular ejection fraction, by estimation, is 45 to 50%. Left  ventricular ejection fraction by 3D volume is 49 %. The left ventricle has  mildly decreased function.   3. Right ventricular systolic function is normal. The right ventricular  size is normal.   4. Left atrial size was mildly dilated. No left atrial/left atrial  appendage thrombus was detected.   5. Right atrial size was mildly dilated.   6. Tricuspid valve regurgitation is mild to moderate.   7. The aortic valve is tricuspid. Aortic valve regurgitation  is not  visualized. No aortic stenosis is present.   8. Cannot exclude a small PFO. Agitated saline contrast bubble study was  positive with shunting observed within 3-6 cardiac cycles suggestive of  interatrial shunt. This was small and only occured with Valsalva.   TTE 04/2022: IMPRESSIONS     1. Severe mitral regurgitation. Central jet related to restricted PMVL.  The mitral valve is abnormal. Severe mitral valve regurgitation. No  evidence of mitral stenosis.   2. Left ventricular ejection fraction, by estimation, is 40 to 45%. The  left ventricle has mildly decreased function. The left ventricle  demonstrates global hypokinesis. Left ventricular diastolic function could  not be evaluated.   3. Right ventricular systolic function is mildly reduced. The right  ventricular size is normal. There is mildly elevated pulmonary artery  systolic pressure. The estimated right ventricular systolic pressure is  [id number] mmHg.   4. Left atrial size was severely dilated.   5. The aortic valve is tricuspid. Aortic valve regurgitation is not  visualized. No aortic stenosis is present.   6. The inferior vena cava is normal in size with greater than 50%  respiratory variability, suggesting right atrial pressure of 3 mmHg.   EKG:  EKG is  ordered today.  The ekg ordered today demonstrates Afib with HR 77  Recent Labs: 01/04/2022: TSH 2.770 03/20/2022: ALT 28  Recent Lipid Panel    Component Value Date/Time   CHOL 156 04/26/2022 0956   TRIG 104 04/26/2022 0956   HDL 73 04/26/2022 0956   CHOLHDL 2.1 04/26/2022 0956   CHOLHDL 2.2 09/25/2015 0959   VLDL 17 09/25/2015 0959   LDLCALC 64 04/26/2022 0956     Risk Assessment/Calculations:    CHA2DS2-VASc Score = 5   This indicates a 7.2% annual risk of stroke. The patient's score is based upon: CHF History: 1 HTN History: 1 Diabetes History: 0 Stroke History: 0 Vascular Disease History: 1 Age Score: 1 Gender Score: 1                Physical Exam:    VS:  BP 122/86 (BP Location: Left Arm, Patient Position: Sitting, Cuff Size: Normal)   Pulse 77   Ht 5' 4.5" (1.638 m)   Wt 151 lb 9.6 oz (68.8 kg)   LMP 11/08/2001 (Approximate)   BMI 25.62 kg/m     Wt Readings from Last 3 Encounters:  08/21/22 151 lb 9.6 oz (68.8 kg)  04/18/22 151 lb 7.3 oz (68.7 kg)  03/25/22 151 lb 6.4 oz (68.7 kg)     GEN:  Well nourished, well developed in no acute distress HEENT: Normal NECK: No JVD; No carotid bruits CARDIAC: RRR, 2/6 systolic murmur RESPIRATORY:  Clear to auscultation without rales, wheezing or rhonchi  ABDOMEN: Soft, non-tender, non-distended MUSCULOSKELETAL:  No edema; No deformity  SKIN: Warm and dry NEUROLOGIC:  Alert and oriented x 3 PSYCHIATRIC:  Normal affect   ASSESSMENT:    1. Chronic systolic heart failure ([place])   2. Chronic anticoagulation   3. Permanent atrial fibrillation ([place])   4. Mixed hyperlipidemia   5. CAD in native artery   6. Medication management   7. Moderate mitral regurgitation    PLAN:    In order of problems listed above:  #Chronic Systolic HF with mildly reduced EF: LVEF 40-45% by TTE 04/2022 (from 55% in 2016) with last cath in 2016 with moderate nonobstructive disease. No ischemic symptoms and patient is currently euvolemic and compensated on exam with NYHA class I-II symptoms.  -Check NM PET due to drop in EF and history of moderate CAD -Continue metop '50mg'$  XL daily -Start spironolactone 12.'5mg'$  daily -Start farxiga '10mg'$  daily -Did not tolerate entresto due to hypotension  #Moderate to severe MR: Functional MR that appeared severe on surface TTE in 04/2022 but moderate vs moderate to severe on TEE. Currently asymptomatic and active. Will continue serial monitoring with repeat TTE in 10/2022. -Check TTE 10/2022 for serial monitoring  #Permanent Afib: CHADs-vasc 5. Currently well rate controlled and asymptomatic. Tolerating apixaban as prescribed.  -Continue apixaban '5mg'$   BID -Continue metop '50mg'$  XL daily  #Nonobstructive CAD: No anginal symptoms. -Not on ASA due to need for AC -Continue lipitor '40mg'$  daily -Continue zetia '10mg'$  daily  #HLD: -Continue lipitor '40mg'$  daily -Continue zetia '10mg'$  daily -LDL at goal 64           Medication Adjustments/Labs and Tests Ordered: Current medicines are reviewed at length with the patient today.  Concerns regarding medicines are outlined above.  Orders Placed This Encounter  Procedures   NM PET CT CARDIAC PERFUSION MULTI W/ABSOLUTE BLOODFLOW   Basic metabolic panel   EKG [id number]   ECHOCARDIOGRAM COMPLETE   Meds ordered this encounter  Medications   dapagliflozin propanediol (FARXIGA) 10 MG TABS tablet    Sig: Take 1 tablet (10 mg total) by mouth daily before breakfast.    Dispense:  90 tablet    Refill:  3   spironolactone (ALDACTONE) 25 MG tablet    Sig: Take 0.5 tablets (12.5 mg total) by mouth daily.    Dispense:  45 tablet    Refill:  3   dapagliflozin propanediol (FARXIGA) 10 MG TABS tablet    Sig: Take 1 tablet (10 mg total) by mouth daily before breakfast.    Dispense:  14 tablet    Refill:  0    Order Specific Question:   Lot Number?    Answer:   [id number]    Order Specific Question:   Expiration Date?    Answer:   02/07/2025    Patient Instructions  Medication Instructions:  Your physician has recommended you make the following change in your medication:   Start: Spironolactone 12.'5mg'$  daily   Start: Wilder Glade '10mg'$  daily- We will send your information to the prior auth team to make sure that it is covered by your insurance.   *If you need a refill on your cardiac medications before your next appointment, please  call your pharmacy*   Lab Work: Your physician recommends that you return for lab work in one week for BMP   If you have labs (blood work) drawn today and your tests are completely normal, you will receive your results only by: [place] (if you have MyChart) OR A paper  copy in the mail If you have any lab test that is abnormal or we need to change your treatment, we will call you to review the results.   Testing/Procedures: Your physician has requested that you have an echocardiogram in May . Echocardiography is a painless test that uses sound waves to create images of your heart. It provides your doctor with information about the size and shape of your heart and how well your heart's chambers and valves are working. This procedure takes approximately one hour. There are no restrictions for this procedure. Please do NOT wear cologne, perfume, aftershave, or lotions (deodorant is allowed). Please arrive 15 minutes prior to your appointment time.    How to Prepare for Your Cardiac PET/CT Stress Test:  1. Please do not take these medications before your test:   Medications that may interfere with the cardiac pharmacological stress agent (ex. nitrates - including erectile dysfunction medications, isosorbide mononitrate, tamulosin or beta-blockers) the day of the exam. (Erectile dysfunction medication should be held for at least 72 hrs prior to test) Theophylline containing medications for 12 hours. Dipyridamole 48 hours prior to the test. Your remaining medications may be taken with water.  2. Nothing to eat or drink, except water, 3 hours prior to arrival time.   NO caffeine/decaffeinated products, or chocolate 12 hours prior to arrival.  3. NO perfume, cologne or lotion  4. Total time is 1 to 2 hours; you may want to bring reading material for the waiting time.  5. Please report to Radiology at the [facility] Main Entrance 30 minutes early for your test.  [place], [place] [id number]  Diabetic Preparation:  Hold oral medications. You may take NPH and Lantus insulin. Do not take Humalog or Humulin R (Regular Insulin) the day of your test. Check blood sugars prior to leaving the house. If able to eat breakfast prior to 3  hour fasting, you may take all medications, including your insulin, Do not worry if you miss your breakfast dose of insulin - start at your next meal.  IF YOU THINK YOU MAY BE PREGNANT, OR ARE NURSING PLEASE INFORM THE TECHNOLOGIST.  In preparation for your appointment, medication and supplies will be purchased.  Appointment availability is limited, so if you need to cancel or reschedule, please call the Radiology Department at [phone]  24 hours in advance to avoid a cancellation fee of $100.00  What to Expect After you Arrive:  Once you arrive and check in for your appointment, you will be taken to a preparation room within the Radiology Department.  A technologist or Nurse will obtain your medical history, verify that you are correctly prepped for the exam, and explain the procedure.  Afterwards,  an IV will be started in your arm and electrodes will be placed on your skin for EKG monitoring during the stress portion of the exam. Then you will be escorted to the PET/CT scanner.  There, staff will get you positioned on the scanner and obtain a blood pressure and EKG.  During the exam, you will continue to be connected to the EKG and blood pressure machines.  A small, safe amount of  a radioactive tracer will be injected in your IV to obtain a series of pictures of your heart along with an injection of a stress agent.    After your Exam:  It is recommended that you eat a meal and drink a caffeinated beverage to counter act any effects of the stress agent.  Drink plenty of fluids for the remainder of the day and urinate frequently for the first couple of hours after the exam.  Your doctor will inform you of your test results within 7-10 business days.  For questions about your test or how to prepare for your test, please call: Marchia Bond, Cardiac Imaging Nurse Navigator  Gordy Clement, Cardiac Imaging Nurse Navigator Office: [phone]     Follow-Up: At [facility], you and  your health needs are our priority.  As part of our continuing mission to provide you with exceptional heart care, we have created designated Provider Care Teams.  These Care Teams include your primary Cardiologist (physician) and Advanced Practice Providers (APPs -  Physician Assistants and Nurse Practitioners) who all work together to provide you with the care you need, when you need it.  We recommend signing up for the patient portal called "MyChart".  Sign up information is provided on this After Visit Summary.  MyChart is used to connect with patients for Virtual Visits (Telemedicine).  Patients are able to view lab/test results, encounter notes, upcoming appointments, etc.  Non-urgent messages can be sent to your provider as well.   To learn more about what you can do with MyChart, go to [email or web address].    Your next appointment:   6 month(s)  Provider:   Gwyndolyn Kaufman, MD      Signed, Freada Bergeron, MD  08/21/2022 12:34 PM    [place]

## 2022-08-21 ENCOUNTER — Encounter (HOSPITAL_BASED_OUTPATIENT_CLINIC_OR_DEPARTMENT_OTHER): Payer: Self-pay | Admitting: Cardiology

## 2022-08-21 ENCOUNTER — Other Ambulatory Visit (HOSPITAL_COMMUNITY): Payer: Self-pay

## 2022-08-21 ENCOUNTER — Telehealth: Payer: Self-pay

## 2022-08-21 ENCOUNTER — Ambulatory Visit (HOSPITAL_BASED_OUTPATIENT_CLINIC_OR_DEPARTMENT_OTHER): Payer: Medicare HMO | Admitting: Cardiology

## 2022-08-21 VITALS — BP 122/86 | HR 77 | Ht 64.5 in | Wt 151.6 lb

## 2022-08-21 DIAGNOSIS — I4821 Permanent atrial fibrillation: Secondary | ICD-10-CM

## 2022-08-21 DIAGNOSIS — Z7901 Long term (current) use of anticoagulants: Secondary | ICD-10-CM | POA: Diagnosis not present

## 2022-08-21 DIAGNOSIS — I5022 Chronic systolic (congestive) heart failure: Secondary | ICD-10-CM

## 2022-08-21 DIAGNOSIS — I340 Nonrheumatic mitral (valve) insufficiency: Secondary | ICD-10-CM

## 2022-08-21 DIAGNOSIS — Z79899 Other long term (current) drug therapy: Secondary | ICD-10-CM

## 2022-08-21 DIAGNOSIS — E782 Mixed hyperlipidemia: Secondary | ICD-10-CM | POA: Diagnosis not present

## 2022-08-21 DIAGNOSIS — I2510 Atherosclerotic heart disease of native coronary artery without angina pectoris: Secondary | ICD-10-CM

## 2022-08-21 MED ORDER — DAPAGLIFLOZIN PROPANEDIOL 10 MG PO TABS: 10.0000 mg | ORAL_TABLET | Freq: Every day | ORAL | 3 refills | Status: DC

## 2022-08-21 MED ORDER — SPIRONOLACTONE 25 MG PO TABS: 12.5000 mg | ORAL_TABLET | Freq: Every day | ORAL | 3 refills | Status: DC

## 2022-08-21 MED ORDER — DAPAGLIFLOZIN PROPANEDIOL 10 MG PO TABS: 10.0000 mg | ORAL_TABLET | Freq: Every day | ORAL | 0 refills | Status: DC

## 2022-08-21 NOTE — Patient Instructions (Addendum)
Medication Instructions:  Your physician has recommended you make the following change in your medication:   Start: Spironolactone 12.'5mg'$  daily   Start: Wilder Glade '10mg'$  daily- We will send your information to the prior auth team to make sure that it is covered by your insurance.   *If you need a refill on your cardiac medications before your next appointment, please call your pharmacy*   Lab Work: Your physician recommends that you return for lab work in one week for BMP   If you have labs (blood work) drawn today and your tests are completely normal, you will receive your results only by: [place] (if you have MyChart) OR A paper copy in the mail If you have any lab test that is abnormal or we need to change your treatment, we will call you to review the results.   Testing/Procedures: Your physician has requested that you have an echocardiogram in May . Echocardiography is a painless test that uses sound waves to create images of your heart. It provides your doctor with information about the size and shape of your heart and how well your heart's chambers and valves are working. This procedure takes approximately one hour. There are no restrictions for this procedure. Please do NOT wear cologne, perfume, aftershave, or lotions (deodorant is allowed). Please arrive 15 minutes prior to your appointment time.    How to Prepare for Your Cardiac PET/CT Stress Test:  1. Please do not take these medications before your test:   Medications that may interfere with the cardiac pharmacological stress agent (ex. nitrates - including erectile dysfunction medications, isosorbide mononitrate, tamulosin or beta-blockers) the day of the exam. (Erectile dysfunction medication should be held for at least 72 hrs prior to test) Theophylline containing medications for 12 hours. Dipyridamole 48 hours prior to the test. Your remaining medications may be taken with water.  2. Nothing to eat or drink,  except water, 3 hours prior to arrival time.   NO caffeine/decaffeinated products, or chocolate 12 hours prior to arrival.  3. NO perfume, cologne or lotion  4. Total time is 1 to 2 hours; you may want to bring reading material for the waiting time.  5. Please report to Radiology at the [facility] Main Entrance 30 minutes early for your test.  [place], [place] [address]  Diabetic Preparation:  Hold oral medications. You may take NPH and Lantus insulin. Do not take Humalog or Humulin R (Regular Insulin) the day of your test. Check blood sugars prior to leaving the house. If able to eat breakfast prior to 3 hour fasting, you may take all medications, including your insulin, Do not worry if you miss your breakfast dose of insulin - start at your next meal.  IF YOU THINK YOU MAY BE PREGNANT, OR ARE NURSING PLEASE INFORM THE TECHNOLOGIST.  In preparation for your appointment, medication and supplies will be purchased.  Appointment availability is limited, so if you need to cancel or reschedule, please call the Radiology Department at [phone]  24 hours in advance to avoid a cancellation fee of $100.00  What to Expect After you Arrive:  Once you arrive and check in for your appointment, you will be taken to a preparation room within the Radiology Department.  A technologist or Nurse will obtain your medical history, verify that you are correctly prepped for the exam, and explain the procedure.  Afterwards,  an IV will be started in your arm and electrodes will be placed  on your skin for EKG monitoring during the stress portion of the exam. Then you will be escorted to the PET/CT scanner.  There, staff will get you positioned on the scanner and obtain a blood pressure and EKG.  During the exam, you will continue to be connected to the EKG and blood pressure machines.  A small, safe amount of a radioactive tracer will be injected in your IV to obtain a series of  pictures of your heart along with an injection of a stress agent.    After your Exam:  It is recommended that you eat a meal and drink a caffeinated beverage to counter act any effects of the stress agent.  Drink plenty of fluids for the remainder of the day and urinate frequently for the first couple of hours after the exam.  Your doctor will inform you of your test results within 7-10 business days.  For questions about your test or how to prepare for your test, please call: Marchia Bond, Cardiac Imaging Nurse Navigator  Gordy Clement, Cardiac Imaging Nurse Navigator Office: [phone]     Follow-Up: At [facility], you and your health needs are our priority.  As part of our continuing mission to provide you with exceptional heart care, we have created designated Provider Care Teams.  These Care Teams include your primary Cardiologist (physician) and Advanced Practice Providers (APPs -  Physician Assistants and Nurse Practitioners) who all work together to provide you with the care you need, when you need it.  We recommend signing up for the patient portal called "MyChart".  Sign up information is provided on this After Visit Summary.  MyChart is used to connect with patients for Virtual Visits (Telemedicine).  Patients are able to view lab/test results, encounter notes, upcoming appointments, etc.  Non-urgent messages can be sent to your provider as well.   To learn more about what you can do with MyChart, go to [email or web address].    Your next appointment:   6 month(s)  Provider:   Gwyndolyn Kaufman, MD

## 2022-08-21 NOTE — Telephone Encounter (Signed)
-----   Message from Maralyn Sago, CPhT sent at 08/21/2022  1:01 PM EDT ----- Arby Barrette this doesn't need a p/a.   Per test claim a 90day supply for the pt is around/about  $141.00 ----- Message ----- From: Gerald Stabs, RN Sent: 08/21/2022  12:07 PM EDT To: Nuala Alpha, LPN; Rx Prior Auth Team  Patient started on Farxiga 10mg  today by Dr. Johney Frame, may need prior auth, Thanks!

## 2022-08-27 ENCOUNTER — Telehealth: Payer: Self-pay | Admitting: Cardiology

## 2022-08-27 NOTE — Telephone Encounter (Signed)
Pt c/o medication issue:  1. Name of Medication:   dapagliflozin propanediol (FARXIGA) 10 MG TABS tablet  spironolactone (ALDACTONE) 25 MG tablet   2. How are you currently taking this medication (dosage and times per day)?   Not started yet  3. Are you having a reaction (difficulty breathing--STAT)?   N/A  4. What is your medication issue?   Patient wants more information on these medications before she starts taking them.

## 2022-08-27 NOTE — Telephone Encounter (Signed)
Pt was calling in to go over the medications Farxiga and spironolactone and as to why these were prescribed.   Pt education provided on these meds and rationale as to why they were prescribed by Dr. Johney Frame.  Went over the assessment and plan from Dr. Jacolyn Reedy last [place] with the pt from 3/13 and as copied below.  Pt voiced no further concerns and all needs were met.   Pt verbalized understanding and agrees with this plan.  Pt was more than gracious for all the assistance provided.     #Chronic Systolic HF with mildly reduced EF: LVEF 40-45% by TTE 04/2022 (from 55% in 2016) with last cath in 2016 with moderate nonobstructive disease. No ischemic symptoms and patient is currently euvolemic and compensated on exam with NYHA class I-II symptoms.  -Check NM PET due to drop in EF and history of moderate CAD -Continue metop 50mg  XL daily -Start spironolactone 12.5mg  daily -Start farxiga 10mg  daily -Did not tolerate entresto due to hypotension

## 2022-09-04 DIAGNOSIS — Z7901 Long term (current) use of anticoagulants: Secondary | ICD-10-CM | POA: Diagnosis not present

## 2022-09-04 DIAGNOSIS — I2510 Atherosclerotic heart disease of native coronary artery without angina pectoris: Secondary | ICD-10-CM | POA: Diagnosis not present

## 2022-09-04 DIAGNOSIS — R011 Cardiac murmur, unspecified: Secondary | ICD-10-CM | POA: Diagnosis not present

## 2022-09-04 DIAGNOSIS — I4821 Permanent atrial fibrillation: Secondary | ICD-10-CM | POA: Diagnosis not present

## 2022-09-04 DIAGNOSIS — E782 Mixed hyperlipidemia: Secondary | ICD-10-CM | POA: Diagnosis not present

## 2022-09-04 DIAGNOSIS — Z79899 Other long term (current) drug therapy: Secondary | ICD-10-CM | POA: Diagnosis not present

## 2022-09-05 ENCOUNTER — Telehealth: Payer: Self-pay | Admitting: *Deleted

## 2022-09-05 DIAGNOSIS — E875 Hyperkalemia: Secondary | ICD-10-CM

## 2022-09-05 DIAGNOSIS — Z79899 Other long term (current) drug therapy: Secondary | ICD-10-CM

## 2022-09-05 LAB — BASIC METABOLIC PANEL
BUN/Creatinine Ratio: 14 (ref 12–28)
BUN: 13 mg/dL (ref 8–27)
CO2: 23 mmol/L (ref 20–29)
Calcium: 10.3 mg/dL (ref 8.7–10.3)
Chloride: 99 mmol/L (ref 96–106)
Creatinine, Ser: 0.94 mg/dL (ref 0.57–1.00)
Glucose: 90 mg/dL (ref 70–99)
Potassium: 5.3 mmol/L — ABNORMAL HIGH (ref 3.5–5.2)
Sodium: 138 mmol/L (ref 134–144)
eGFR: 65 mL/min/{1.73_m2} (ref >59)

## 2022-09-05 NOTE — Telephone Encounter (Signed)
-----   Message from Freada Bergeron, MD sent at 09/05/2022  8:19 AM EDT ----- Potassium went a little high. We will have to stop the spiro and repeat labs in 2 weeks to ensure K has improved. Otherwise, kidney function looks stable.

## 2022-09-05 NOTE — Telephone Encounter (Signed)
The patient has been notified of the result and verbalized understanding.  All questions (if any) were answered.  Pt is aware to stop taking spironolactone and we will need for her to come back into the office for repeat bmet in 2 weeks on 09/19/22.  Lab appt scheduled for the pt for 4/11.  Pt verbalized understanding and agrees with this plan.

## 2022-09-10 DIAGNOSIS — L570 Actinic keratosis: Secondary | ICD-10-CM | POA: Diagnosis not present

## 2022-09-10 DIAGNOSIS — D485 Neoplasm of uncertain behavior of skin: Secondary | ICD-10-CM | POA: Diagnosis not present

## 2022-09-16 DIAGNOSIS — G4733 Obstructive sleep apnea (adult) (pediatric): Secondary | ICD-10-CM | POA: Diagnosis not present

## 2022-09-19 ENCOUNTER — Ambulatory Visit: Payer: Medicare HMO | Attending: Internal Medicine

## 2022-09-19 DIAGNOSIS — Z79899 Other long term (current) drug therapy: Secondary | ICD-10-CM

## 2022-09-19 DIAGNOSIS — E875 Hyperkalemia: Secondary | ICD-10-CM

## 2022-09-20 ENCOUNTER — Telehealth: Payer: Self-pay | Admitting: Cardiology

## 2022-09-20 ENCOUNTER — Other Ambulatory Visit: Payer: Self-pay | Admitting: *Deleted

## 2022-09-20 ENCOUNTER — Other Ambulatory Visit: Payer: Self-pay

## 2022-09-20 DIAGNOSIS — M818 Other osteoporosis without current pathological fracture: Secondary | ICD-10-CM

## 2022-09-20 DIAGNOSIS — E782 Mixed hyperlipidemia: Secondary | ICD-10-CM

## 2022-09-20 DIAGNOSIS — E875 Hyperkalemia: Secondary | ICD-10-CM

## 2022-09-20 DIAGNOSIS — Z79899 Other long term (current) drug therapy: Secondary | ICD-10-CM

## 2022-09-20 DIAGNOSIS — I4821 Permanent atrial fibrillation: Secondary | ICD-10-CM

## 2022-09-20 LAB — BASIC METABOLIC PANEL
BUN/Creatinine Ratio: 12 (ref 12–28)
BUN: 10 mg/dL (ref 8–27)
CO2: 24 mmol/L (ref 20–29)
Calcium: 9.6 mg/dL (ref 8.7–10.3)
Chloride: 106 mmol/L (ref 96–106)
Creatinine, Ser: 0.86 mg/dL (ref 0.57–1.00)
Glucose: 86 mg/dL (ref 70–99)
Potassium: 5.5 mmol/L — ABNORMAL HIGH (ref 3.5–5.2)
Sodium: 145 mmol/L — ABNORMAL HIGH (ref 134–144)
eGFR: 73 mL/min/{1.73_m2} (ref >59)

## 2022-09-20 MED ORDER — APIXABAN 5 MG PO TABS: 5.0000 mg | ORAL_TABLET | Freq: Two times a day (BID) | ORAL | 5 refills | Status: DC

## 2022-09-20 NOTE — Telephone Encounter (Signed)
*  STAT* If patient is at the pharmacy, call can be transferred to refill team.   1. Which medications need to be refilled? (please list name of each medication and dose if known)  apixaban (ELIQUIS) 5 MG TABS tablet  2. Which pharmacy/location (including street and city if local pharmacy) is medication to be sent to? [facility] [id number] - [place], [place] - [address]   3. Do they need a 30 day or 90 day supply?  90 day supply

## 2022-09-20 NOTE — Telephone Encounter (Signed)
Eliquis refill sent in refill encounter. This is a duplicate.

## 2022-09-20 NOTE — Telephone Encounter (Signed)
Sherrita, Wesch - 09/20/2022 10:28 AM Meriam Sprague, MD  Sent: Caleen Essex September 20, 2022  3:20 PM  To: Olene Floss, RPH-CPP; Loa Socks, LPN         Message  Sounds like a plan.    Will stop the supplements and maybe just try to cut down a bit on potassium rich foods. Hopefully she has stopped the spironolactone. Will make sure she hydrates well before next labs and we can give it another go in 7-10days to see if better.    Left the pt a message to call the office back to endorse PharmD's recommendations as well as Dr. Devin Going recommendations about her lab results.

## 2022-09-20 NOTE — Telephone Encounter (Signed)
Pt made aware of final lab results and recommendations per Dr. Shari Prows and our Pharmacist.   She is aware to continue to hold spironolactone, and hold all her vitamins and supplements, then come back in for repeat BMET in 7-10 days.   Did also advise her to cut down on potassium rich foods and make sure she is hydrating well with water and especially hydrating well with water the day of her repeat bmet.   Scheduled the pt for repeat BMET in 7-10 days on 09/27/22.  Pt verbalized understanding and agrees with this plan.

## 2022-09-20 NOTE — Telephone Encounter (Signed)
I don't see any medications that can increase K. I might have her stop all her vitamins and supplements. There have been reports of these being tainted.  Her yogurt and spinach is high in K but I dont think that's the cause. I would have patient be sure that she stopped taking the spironolactone.

## 2022-09-20 NOTE — Telephone Encounter (Signed)
Called the pt back and endorsed to her that Dr. Shari Prows hasn't had a chance to review her lab results yet, but she will sometime day and I will call her shortly thereafter with those.   Did note in her lab that her K level went up to 5.5 and 2 weeks ago it was 5.3, where we advised at that time to hold her spironolactone and come in for repeat bmet in 2 weeks.  This was advised on at 3/20 lab result.  Asked the pt if she held her spironolactone as advised over 2 weeks ago, and she confirmed she started holding it the day I advised her to on 3/20.  Asked her if she is consuming large amounts of potassium in her diet, and she said she is not.  She has oatmeal and blueberries in the morning, yogurt for lunch, and a protein source and veges like spinach at dinner time.  Asked her to look at her vitamins to see if there is potassium in them, and she did this while on the phone with me and confirmed there is no potassium noted in her vitamins.  Advised the pt to continue holding her spironolactone and as soon as Dr. Shari Prows further advises on her lab results, I will call her shortly thereafter.   Pt verbalized understanding and agrees with this plan.

## 2022-09-20 NOTE — Telephone Encounter (Signed)
Eliquis 5mg  refill request received. Patient is 72 years old, weight-68.8kg, Crea-0.86 on 09/19/22, Diagnosis-Afib, and last seen by Dr. Shari Prows on 08/21/22. Dose is appropriate based on dosing criteria. Will send in refill to requested pharmacy.

## 2022-09-20 NOTE — Telephone Encounter (Signed)
Prescription refill request for Eliquis received. Indication: Afib  Last office visit: 08/21/22 Shari Prows)  Scr: 0.86 (09/19/22)  Age: 72 Weight: 68.8kg  Appropriate dose. Refill sent.

## 2022-09-20 NOTE — Telephone Encounter (Signed)
Rhonda Paul, Rhonda Paul - 09/20/2022 10:28 AM Meriam Sprague, MD  Sent: Caleen Essex September 20, 2022  3:20 PM  To: Olene Floss, RPH-CPP; Loa Socks, LPN         Message  Sounds like a plan.    Will stop the supplements and maybe just try to cut down a bit on potassium rich foods. Hopefully she has stopped the spironolactone. Will make sure she hydrates well before next labs and we can give it another go in 7-10days to see if better.

## 2022-09-20 NOTE — Telephone Encounter (Signed)
Patient would like a call back to discuss lab results.

## 2022-09-23 ENCOUNTER — Ambulatory Visit: Payer: Medicare HMO | Admitting: Cardiology

## 2022-09-27 ENCOUNTER — Ambulatory Visit: Payer: Medicare HMO | Attending: Cardiology

## 2022-09-27 DIAGNOSIS — Z79899 Other long term (current) drug therapy: Secondary | ICD-10-CM | POA: Diagnosis not present

## 2022-09-27 DIAGNOSIS — E875 Hyperkalemia: Secondary | ICD-10-CM

## 2022-09-28 LAB — BASIC METABOLIC PANEL
BUN/Creatinine Ratio: 10 — ABNORMAL LOW (ref 12–28)
BUN: 9 mg/dL (ref 8–27)
CO2: 23 mmol/L (ref 20–29)
Calcium: 9.5 mg/dL (ref 8.7–10.3)
Chloride: 102 mmol/L (ref 96–106)
Creatinine, Ser: 0.88 mg/dL (ref 0.57–1.00)
Glucose: 100 mg/dL — ABNORMAL HIGH (ref 70–99)
Potassium: 4.3 mmol/L (ref 3.5–5.2)
Sodium: 140 mmol/L (ref 134–144)
eGFR: 71 mL/min/{1.73_m2} (ref >59)

## 2022-10-03 ENCOUNTER — Ambulatory Visit (INDEPENDENT_AMBULATORY_CARE_PROVIDER_SITE_OTHER): Payer: Medicare HMO

## 2022-10-03 DIAGNOSIS — Z79899 Other long term (current) drug therapy: Secondary | ICD-10-CM | POA: Diagnosis not present

## 2022-10-03 DIAGNOSIS — I4821 Permanent atrial fibrillation: Secondary | ICD-10-CM

## 2022-10-03 DIAGNOSIS — I5022 Chronic systolic (congestive) heart failure: Secondary | ICD-10-CM | POA: Diagnosis not present

## 2022-10-03 DIAGNOSIS — I340 Nonrheumatic mitral (valve) insufficiency: Secondary | ICD-10-CM

## 2022-10-03 DIAGNOSIS — E782 Mixed hyperlipidemia: Secondary | ICD-10-CM | POA: Diagnosis not present

## 2022-10-03 DIAGNOSIS — I2510 Atherosclerotic heart disease of native coronary artery without angina pectoris: Secondary | ICD-10-CM | POA: Diagnosis not present

## 2022-10-03 DIAGNOSIS — Z7901 Long term (current) use of anticoagulants: Secondary | ICD-10-CM

## 2022-10-03 LAB — ECHOCARDIOGRAM COMPLETE
Area-P 1/2: 5.66 cm2
MV M vel: 5.32 m/s
MV Peak grad: 113.2 mmHg
Radius: 0.5 cm
S' Lateral: 2.5 cm

## 2022-10-04 ENCOUNTER — Telehealth (HOSPITAL_COMMUNITY): Payer: Self-pay | Admitting: Emergency Medicine

## 2022-10-04 NOTE — Telephone Encounter (Signed)
Attempted to call patient regarding upcoming cardiac CT appointment. °Left message on voicemail with name and callback number °Amorie Rentz RN Navigator Cardiac Imaging °[facility] °[phone] Office °[phone] Cell ° °

## 2022-10-07 ENCOUNTER — Telehealth (HOSPITAL_COMMUNITY): Payer: Self-pay | Admitting: Emergency Medicine

## 2022-10-07 NOTE — Telephone Encounter (Signed)
Reaching out to patient to offer assistance regarding upcoming cardiac imaging study; pt verbalizes understanding of appt date/time, parking situation and where to check in, pre-test NPO status and medications ordered, and verified current allergies; name and call back number provided for further questions should they arise Rockwell Alexandria RN Navigator Cardiac Imaging [facility] [phone] office [phone] cell  No caffeine, no food, daily meds except toprol xl

## 2022-10-08 ENCOUNTER — Encounter (HOSPITAL_COMMUNITY)
Admission: RE | Admit: 2022-10-08 | Discharge: 2022-10-08 | Disposition: A | Discharge status: Home / Self Care | Payer: Medicare HMO | Source: Ambulatory Visit | Attending: Cardiology | Admitting: Cardiology

## 2022-10-08 DIAGNOSIS — Z79899 Other long term (current) drug therapy: Secondary | ICD-10-CM | POA: Diagnosis not present

## 2022-10-08 DIAGNOSIS — I2510 Atherosclerotic heart disease of native coronary artery without angina pectoris: Secondary | ICD-10-CM

## 2022-10-08 DIAGNOSIS — E782 Mixed hyperlipidemia: Secondary | ICD-10-CM

## 2022-10-08 DIAGNOSIS — I5022 Chronic systolic (congestive) heart failure: Secondary | ICD-10-CM | POA: Diagnosis not present

## 2022-10-08 DIAGNOSIS — I4821 Permanent atrial fibrillation: Secondary | ICD-10-CM

## 2022-10-08 DIAGNOSIS — Z7901 Long term (current) use of anticoagulants: Secondary | ICD-10-CM | POA: Diagnosis not present

## 2022-10-08 LAB — NM PET CT CARDIAC PERFUSION MULTI W/ABSOLUTE BLOODFLOW
MBFR: 1.63
Nuc Rest EF: 58 %
Nuc Stress EF: 58 %
Peak HR: 80 {beats}/min
Rest HR: 75 {beats}/min
Rest MBF: 0.89 ml/g/min
Rest Nuclear Isotope Dose: 17.8 mCi
ST Depression (mm): 0 mm
Stress MBF: 1.45 ml/g/min
Stress Nuclear Isotope Dose: 17.8 mCi
TID: 1.07

## 2022-10-08 MED ORDER — REGADENOSON 0.4 MG/5ML IV SOLN
0.4000 mg | Freq: Once | INTRAVENOUS | Status: AC
  Administered 2022-10-08: 0.4 mg via INTRAVENOUS

## 2022-10-08 MED ORDER — RUBIDIUM RB82 GENERATOR (RUBYFILL)
17.2000 | PACK | Freq: Once | INTRAVENOUS | Status: AC
  Administered 2022-10-08: 17.8 via INTRAVENOUS

## 2022-10-08 MED ORDER — REGADENOSON 0.4 MG/5ML IV SOLN
INTRAVENOUS | Status: AC
  Filled 2022-10-08: qty 5

## 2022-10-08 NOTE — Progress Notes (Signed)
Pt tolerated test well.  Slight dizziness upon sitting up, resolved after sitting for a few seconds. VS WNL

## 2022-10-14 ENCOUNTER — Telehealth: Payer: Self-pay | Admitting: Cardiology

## 2022-10-14 NOTE — Telephone Encounter (Signed)
Patient is returning call.  °

## 2022-10-14 NOTE — Telephone Encounter (Signed)
-----   Message from Rhonda Sprague, MD sent at 10/14/2022  1:58 PM EDT ----- Called and talked to the patient about her PET results. She is asymptomatic and active and would like to continue with medical management at this time. If symptoms change, we can plan for cath at that time.

## 2022-10-14 NOTE — Telephone Encounter (Signed)
Dr. Shari Prows tried calling the pt last Friday to go over her Cardiac PET Scan results with her over the phone.   Will route this message to Dr. Shari Prows to further review and follow-up with the pt about results.

## 2022-10-14 NOTE — Telephone Encounter (Signed)
Rhonda Sprague, MD  Loa Socks, LPN Called and talked to the patient about her PET results. She is asymptomatic and active and would like to continue with medical management at this time. If symptoms change, we can plan for cath at that time.

## 2022-10-14 NOTE — Telephone Encounter (Signed)
Patient sees Dr Shari Prows and do not see where office has reached out  Will forward to [facility] triage

## 2022-10-16 DIAGNOSIS — G4733 Obstructive sleep apnea (adult) (pediatric): Secondary | ICD-10-CM | POA: Diagnosis not present

## 2022-10-18 DIAGNOSIS — M5451 Vertebrogenic low back pain: Secondary | ICD-10-CM | POA: Diagnosis not present

## 2022-10-24 DIAGNOSIS — Z1389 Encounter for screening for other disorder: Secondary | ICD-10-CM | POA: Diagnosis not present

## 2022-10-24 DIAGNOSIS — Z0000 Encounter for general adult medical examination without abnormal findings: Secondary | ICD-10-CM | POA: Diagnosis not present

## 2022-10-24 DIAGNOSIS — Z6823 Body mass index (BMI) 23.0-23.9, adult: Secondary | ICD-10-CM | POA: Diagnosis not present

## 2022-11-11 DIAGNOSIS — I480 Paroxysmal atrial fibrillation: Secondary | ICD-10-CM | POA: Diagnosis not present

## 2022-11-11 DIAGNOSIS — I700 Atherosclerosis of aorta: Secondary | ICD-10-CM | POA: Diagnosis not present

## 2022-11-11 DIAGNOSIS — Z6824 Body mass index (BMI) 24.0-24.9, adult: Secondary | ICD-10-CM | POA: Diagnosis not present

## 2022-11-11 DIAGNOSIS — I5022 Chronic systolic (congestive) heart failure: Secondary | ICD-10-CM | POA: Diagnosis not present

## 2022-11-11 DIAGNOSIS — I2510 Atherosclerotic heart disease of native coronary artery without angina pectoris: Secondary | ICD-10-CM | POA: Diagnosis not present

## 2022-11-11 DIAGNOSIS — I340 Nonrheumatic mitral (valve) insufficiency: Secondary | ICD-10-CM | POA: Diagnosis not present

## 2022-11-11 DIAGNOSIS — H9191 Unspecified hearing loss, right ear: Secondary | ICD-10-CM | POA: Diagnosis not present

## 2022-11-11 DIAGNOSIS — D6869 Other thrombophilia: Secondary | ICD-10-CM | POA: Diagnosis not present

## 2022-11-11 DIAGNOSIS — E039 Hypothyroidism, unspecified: Secondary | ICD-10-CM | POA: Diagnosis not present

## 2022-11-14 ENCOUNTER — Other Ambulatory Visit (HOSPITAL_BASED_OUTPATIENT_CLINIC_OR_DEPARTMENT_OTHER): Payer: Self-pay | Admitting: Obstetrics & Gynecology

## 2022-11-16 DIAGNOSIS — G4733 Obstructive sleep apnea (adult) (pediatric): Secondary | ICD-10-CM | POA: Diagnosis not present

## 2022-11-19 ENCOUNTER — Telehealth: Payer: Self-pay | Admitting: *Deleted

## 2022-11-19 ENCOUNTER — Telehealth: Payer: Self-pay

## 2022-11-19 DIAGNOSIS — I10 Essential (primary) hypertension: Secondary | ICD-10-CM | POA: Diagnosis not present

## 2022-11-19 DIAGNOSIS — E782 Mixed hyperlipidemia: Secondary | ICD-10-CM | POA: Diagnosis not present

## 2022-11-19 DIAGNOSIS — Z1211 Encounter for screening for malignant neoplasm of colon: Secondary | ICD-10-CM | POA: Diagnosis not present

## 2022-11-19 DIAGNOSIS — I5022 Chronic systolic (congestive) heart failure: Secondary | ICD-10-CM | POA: Diagnosis not present

## 2022-11-19 DIAGNOSIS — Z86010 Personal history of colonic polyps: Secondary | ICD-10-CM | POA: Diagnosis not present

## 2022-11-19 DIAGNOSIS — K5730 Diverticulosis of large intestine without perforation or abscess without bleeding: Secondary | ICD-10-CM | POA: Diagnosis not present

## 2022-11-19 NOTE — Telephone Encounter (Signed)
  Patient Consent for Virtual Visit        Rhonda Paul has provided verbal consent on 11/19/2022 for a virtual visit (video or telephone).   CONSENT FOR VIRTUAL VISIT FOR:  Rhonda Paul  By participating in this virtual visit I agree to the following:  I hereby voluntarily request, consent and authorize [facility] and its employed or contracted physicians, physician assistants, nurse practitioners or other licensed health care professionals (the Practitioner), to provide me with telemedicine health care services (the "Services") as deemed necessary by the treating Practitioner. I acknowledge and consent to receive the Services by the Practitioner via telemedicine. I understand that the telemedicine visit will involve communicating with the Practitioner through live audiovisual communication technology and the disclosure of certain medical information by electronic transmission. I acknowledge that I have been given the opportunity to request an in-person assessment or other available alternative prior to the telemedicine visit and am voluntarily participating in the telemedicine visit.  I understand that I have the right to withhold or withdraw my consent to the use of telemedicine in the course of my care at any time, without affecting my right to future care or treatment, and that the Practitioner or I may terminate the telemedicine visit at any time. I understand that I have the right to inspect all information obtained and/or recorded in the course of the telemedicine visit and may receive copies of available information for a reasonable fee.  I understand that some of the potential risks of receiving the Services via telemedicine include:  Delay or interruption in medical evaluation due to technological equipment failure or disruption; Information transmitted may not be sufficient (e.g. poor resolution of images) to allow for appropriate medical decision making by the  Practitioner; and/or  In rare instances, security protocols could fail, causing a breach of personal health information.  Furthermore, I acknowledge that it is my responsibility to provide information about my medical history, conditions and care that is complete and accurate to the best of my ability. I acknowledge that Practitioner's advice, recommendations, and/or decision may be based on factors not within their control, such as incomplete or inaccurate data provided by me or distortions of diagnostic images or specimens that may result from electronic transmissions. I understand that the practice of medicine is not an exact science and that Practitioner makes no warranties or guarantees regarding treatment outcomes. I acknowledge that a copy of this consent can be made available to me via my patient portal [facility] MyChart), or I can request a printed copy by calling the office of [facility].    I understand that my insurance will be billed for this visit.   I have read or had this consent read to me. I understand the contents of this consent, which adequately explains the benefits and risks of the Services being provided via telemedicine.  I have been provided ample opportunity to ask questions regarding this consent and the Services and have had my questions answered to my satisfaction. I give my informed consent for the services to be provided through the use of telemedicine in my medical care

## 2022-11-19 NOTE — Telephone Encounter (Signed)
Spoke with patient who is agreeable to do a tele visit on 7/26 at 10 am. Consent given, med rec needs to be completed.

## 2022-11-19 NOTE — Telephone Encounter (Signed)
Patient with diagnosis of afib on Eliquis for anticoagulation.    Procedure: colonoscopy Date of procedure: 01/08/23  CHA2DS2-VASc Score = 6  This indicates a 9.7% annual risk of stroke. The patient's score is based upon: CHF History: 1 HTN History: 0 Diabetes History: 0 Stroke History: 2 Vascular Disease History: 1 Age Score: 1 Gender Score: 1   Stroke in 2016.  CrCl 71mL/min Platelet count 226K  Per office protocol, patient can hold Eliquis for 1 day prior to procedure.    **This guidance is not considered finalized until pre-operative APP has relayed final recommendations.**

## 2022-11-19 NOTE — Telephone Encounter (Signed)
   Name: Rhonda Paul  DOB: January 14, 1951  MRN: [id number]  Primary Cardiologist: Lesleigh Noe, MD (Inactive)  Chart reviewed as part of pre-operative protocol coverage. Because of Rhonda Paul past medical history and time since last visit, she will require a follow-up telephone visit in order to better assess preoperative cardiovascular risk.  Pre-op covering staff: - Please schedule appointment and call patient to inform them. If patient already had an upcoming appointment within acceptable timeframe, please add "pre-op clearance" to the appointment notes so provider is aware. - Please contact requesting surgeon's office via preferred method (i.e, phone, fax) to inform them of need for appointment prior to surgery.  Per office protocol, patient can hold Eliquis for 1 day prior to procedure.   Sharlene Dory, PA-C  11/19/2022, 3:57 PM

## 2022-11-19 NOTE — Telephone Encounter (Signed)
   Pre-operative Risk Assessment    Patient Name: Rhonda Paul  DOB: [birth year] MRN: [id number]      Request for Surgical Clearance    Procedure:   COLONOSCOPY  Date of Surgery:  Clearance 01/08/23                                 Surgeon:  DR. MANN Surgeon's Group or Practice Name:  [facility] Phone number:  [phone] Fax number:  [phone]   Type of Clearance Requested:   - Medical  - Pharmacy:  Hold Apixaban (Eliquis)     Type of Anesthesia:   PROPOFOL   Additional requests/questions:    Elpidio Anis   11/19/2022, 12:54 PM

## 2022-12-16 DIAGNOSIS — G4733 Obstructive sleep apnea (adult) (pediatric): Secondary | ICD-10-CM | POA: Diagnosis not present

## 2022-12-23 ENCOUNTER — Other Ambulatory Visit: Payer: Self-pay

## 2022-12-23 DIAGNOSIS — I4821 Permanent atrial fibrillation: Secondary | ICD-10-CM

## 2022-12-23 DIAGNOSIS — M818 Other osteoporosis without current pathological fracture: Secondary | ICD-10-CM

## 2022-12-23 DIAGNOSIS — E782 Mixed hyperlipidemia: Secondary | ICD-10-CM

## 2022-12-23 MED ORDER — APIXABAN 5 MG PO TABS
5.0000 mg | ORAL_TABLET | Freq: Two times a day (BID) | ORAL | 5 refills | Status: DC
Start: 2022-12-23 — End: 2024-01-05

## 2022-12-23 NOTE — Telephone Encounter (Signed)
Prescription refill request for Eliquis received. Indication:afib Last office visit:3/24 Scr:0.88  4/24 Age: 72 Weight:68.8  kg  Prescription refilled

## 2022-12-26 ENCOUNTER — Ambulatory Visit (INDEPENDENT_AMBULATORY_CARE_PROVIDER_SITE_OTHER): Payer: Medicare HMO | Admitting: Obstetrics & Gynecology

## 2022-12-26 ENCOUNTER — Encounter (HOSPITAL_BASED_OUTPATIENT_CLINIC_OR_DEPARTMENT_OTHER): Payer: Self-pay | Admitting: Obstetrics & Gynecology

## 2022-12-26 VITALS — BP 116/72 | HR 59 | Ht 64.5 in | Wt 145.2 lb

## 2022-12-26 DIAGNOSIS — Z780 Asymptomatic menopausal state: Secondary | ICD-10-CM | POA: Diagnosis not present

## 2022-12-26 DIAGNOSIS — A609 Anogenital herpesviral infection, unspecified: Secondary | ICD-10-CM

## 2022-12-26 DIAGNOSIS — Z9189 Other specified personal risk factors, not elsewhere classified: Secondary | ICD-10-CM | POA: Diagnosis not present

## 2022-12-26 DIAGNOSIS — C50011 Malignant neoplasm of nipple and areola, right female breast: Secondary | ICD-10-CM | POA: Diagnosis not present

## 2022-12-26 NOTE — Progress Notes (Unsigned)
72 y.o. G8P0040 Married White or Caucasian female here for breast and pelvic exam.  I am also following her for h/o breast cancer and HSV.  Denies vaginal bleeding.  Having some stressors.  Husband had spinal surgery and he is rehab right now.  She's not exercising like she has in the past.  Weight is stable.    Patient's last menstrual period was 11/08/2001 (approximate).          Sexually active: No.  H/O STD:  h/o HSV  Health Maintenance: PCP: Dr. Sigmund Hazel who does her screening lab work Vaccines are up to date:  yes Colonoscopy:  12/24/2017, follow up 5 years.  She is aware and will have it completed once her husband is improved MMG:  02/22/2022 Negative BMD:  02/11/2019 Normal Last pap smear:  12/19/2020 Negative.   H/o abnormal pap smear:  no   reports that she has never smoked. She has never used smokeless tobacco. She reports current alcohol use of about 7.0 standard drinks of alcohol per week. She reports that she does not use drugs.  Past Medical History:  Diagnosis Date   Adenocarcinoma of breast (HCC) 06/03   RIGHT   Arthritis    Chronic anticoagulation    Eliquis   Diverticulitis 2009   diverticulitis   Heart attack (HCC)    HOH (hard of hearing)    right ear; wears hearing aid   Hypertension    Infertility, female 09/01   4 SAB   Metrorrhagia    Migraine 09/01   Permanent atrial fibrillation (HCC) 04/01/2014   Personal history of chemotherapy 2003   Personal history of radiation therapy 2003   STD (sexually transmitted disease) 1976   Hx of HSV   Stroke (HCC) 01/03/15   s/p TPA R-MCA   Vitamin D deficiency 07/2006    Past Surgical History:  Procedure Laterality Date   APPENDECTOMY  1959   BREAST LUMPECTOMY Right    BREAST SURGERY Right 11/2001   lumpectomy with sentinel node biopsy X 3 all negative, ER/PR negative , chemo 6 doses, and radiation daily X 6 weeks   BUBBLE STUDY  04/18/2022   Procedure: BUBBLE STUDY;  Surgeon: Christell Constant, MD;   Location: [facility];  Service: Cardiovascular;;   CARDIAC CATHETERIZATION N/A 01/16/2015   Procedure: Left Heart Cath and Coronary Angiography;  Surgeon: Lyn Records, MD; OM1 75%, RCA 30%, EF normal; spasm in RCA w/ catheter engagement    COLONOSCOPY  11/2012   diverticulitis, 2 polyps negative, repeat in 5 years   HALLUX FUSION Left 03/03/2017   Procedure: HALLUX RIGIDUS CORRECTION WITH COLECTOMY DEBRIDEMENT AND CAPSULAR RELEASE OF THE FIRST MPJ WITH IMPLANT LEFT FOOT;  Surgeon: Larey Dresser, DPM;  Location: [facility];  Service: Podiatry;  Laterality: Left;   JOINT REPLACEMENT Left 02/2017   big toe   PORT-A-CATH REMOVAL     RADIOLOGY WITH ANESTHESIA N/A 01/03/2015   Procedure: RADIOLOGY WITH ANESTHESIA;  Surgeon: Medication Radiologist, MD;  Location: [facility] OR;  Service: Radiology;  Laterality: N/A;   TEE WITHOUT CARDIOVERSION N/A 04/18/2022   Procedure: TRANSESOPHAGEAL ECHOCARDIOGRAM (TEE);  Surgeon: Christell Constant, MD;  Location: [facility];  Service: Cardiovascular;  Laterality: N/A;    Current Outpatient Medications  Medication Sig Dispense Refill   apixaban (ELIQUIS) 5 MG TABS tablet Take 1 tablet (5 mg total) by mouth 2 (two) times daily. 60 tablet 5   atorvastatin (LIPITOR) 40 MG tablet TAKE ONE TABLET BY MOUTH DAILY (  NEED APPOINTMENT FOR REFILLS) 90 tablet 3   Biotin w/ Vitamins C & E (HAIR/SKIN/NAILS PO) Take 1 capsule by mouth daily.     Coenzyme Q10 (COQ-10) 100 MG capsule Take 100 mg by mouth daily.     dapagliflozin propanediol (FARXIGA) 10 MG TABS tablet Take 1 tablet (10 mg total) by mouth daily before breakfast. 90 tablet 3   ezetimibe (ZETIA) 10 MG tablet Take 1 tablet (10 mg total) by mouth daily. 90 tablet 3   ipratropium (ATROVENT) 0.03 % nasal spray Place 1 spray into both nostrils daily.     levothyroxine (SYNTHROID) 50 MCG tablet TAKE 1 TABLET BY MOUTH DAILY 90 tablet 0   metoprolol succinate (TOPROL-XL) 50 MG 24 hr tablet Take 1 tablet  (50 mg total) by mouth daily. Take with or immediately following a meal. 90 tablet 3   nitroGLYCERIN (NITROSTAT) 0.4 MG SL tablet Place 1 tablet (0.4 mg total) under the tongue every 5 (five) minutes as needed for chest pain. 25 tablet 3   No current facility-administered medications for this visit.    Family History  Problem Relation Age of Onset   Colon cancer Mother    Cervical cancer Mother    Osteoporosis Mother    Cancer Mother    Heart disease Father    Heart disease Brother    Breast cancer Maternal Aunt    Cancer Maternal Aunt    Multiple births Maternal Grandmother    Multiple births Paternal Grandmother     Review of Systems  Constitutional: Negative.   Genitourinary: Negative.     Exam:   BP 116/72 (BP Location: Right Arm, Patient Position: Sitting, Cuff Size: Large)   Pulse (!) 59   Ht 5' 4.5" (1.638 m) Comment: Reported  Wt 145 lb 3.2 oz (65.9 kg)   LMP 11/08/2001 (Approximate)   BMI 24.54 kg/m   Height: 5' 4.5" (163.8 cm) (Reported)  General appearance: alert, cooperative and appears stated age Breasts: *** Abdomen: soft, non-tender; bowel sounds normal; no masses,  no organomegaly Lymph nodes: Cervical, supraclavicular, and axillary nodes normal.  No abnormal inguinal nodes palpated Neurologic: Grossly normal  Pelvic: External genitalia:  no lesions              Urethra:  normal appearing urethra with no masses, tenderness or lesions              Bartholins and Skenes: normal                 Vagina: normal appearing vagina with atrophic changes and no discharge, no lesions              Cervix: no lesions              Pap taken: {yes no:314532} Bimanual Exam:  Uterus:  {exam; uterus:12215}              Adnexa: {exam; adnexa:12223}               Rectovaginal: Confirms               Anus:  normal sphincter tone, no lesions  Chaperone, ***, CMA, was present for exam.  Assessment/Plan: 1. Encounter for gynecologic examination for high-risk patient  covered by Medicare - Pap smear 2022 - Mammogram 02/2022 - Colonoscopy 2019, follow up 5 years - Bone mineral density discussed.  Will plan next year - lab work done with PCP, Dr. Sigmund Hazel - vaccines reviewed/updated  2. Malignant neoplasm of nipple  of right breast in female, unspecified estrogen receptor status (HCC) - diagnosis was 2003  3. HSV (herpes simplex virus) anogenital infection - not on any antiviral therapy  4. Postmenopausal - no HRT

## 2023-01-03 ENCOUNTER — Ambulatory Visit: Payer: Medicare HMO

## 2023-02-04 DIAGNOSIS — U071 COVID-19: Secondary | ICD-10-CM | POA: Diagnosis not present

## 2023-02-25 DIAGNOSIS — G4733 Obstructive sleep apnea (adult) (pediatric): Secondary | ICD-10-CM | POA: Diagnosis not present

## 2023-03-07 ENCOUNTER — Ambulatory Visit: Payer: Medicare HMO | Admitting: Cardiology

## 2023-03-07 DIAGNOSIS — H00014 Hordeolum externum left upper eyelid: Secondary | ICD-10-CM | POA: Diagnosis not present

## 2023-03-10 ENCOUNTER — Telehealth: Payer: Self-pay | Admitting: Cardiology

## 2023-03-10 NOTE — Telephone Encounter (Signed)
Patient is requesting call back to discuss getting labs before her upcoming appt with Dr. Cristal Deer.

## 2023-03-10 NOTE — Telephone Encounter (Signed)
Returned call to patient,   Patient is wanting to get her annual cholesterol labs drawn before her visit, as switching to new provider will send to provider for ok to order.

## 2023-03-19 ENCOUNTER — Other Ambulatory Visit: Payer: Self-pay

## 2023-03-19 ENCOUNTER — Telehealth: Payer: Self-pay | Admitting: Cardiology

## 2023-03-19 DIAGNOSIS — Z5181 Encounter for therapeutic drug level monitoring: Secondary | ICD-10-CM

## 2023-03-19 DIAGNOSIS — E782 Mixed hyperlipidemia: Secondary | ICD-10-CM

## 2023-03-19 DIAGNOSIS — E875 Hyperkalemia: Secondary | ICD-10-CM

## 2023-03-19 MED ORDER — METOPROLOL SUCCINATE ER 50 MG PO TB24: 50.0000 mg | ORAL_TABLET | Freq: Every day | ORAL | 0 refills | Status: DC

## 2023-03-19 MED ORDER — EZETIMIBE 10 MG PO TABS: 10.0000 mg | ORAL_TABLET | Freq: Every day | ORAL | 0 refills | Status: DC

## 2023-03-19 NOTE — Telephone Encounter (Addendum)
Patient gets fasting LP/CMET yearly with Dr Katrinka Blazing Placed order for those and added CBC since patient takes Eliquis   Patient aware

## 2023-03-19 NOTE — Telephone Encounter (Signed)
Patient is scheduled for her annual with Dr. Cristal Deer on 10/16 and says that she usually have blood work done as well but does not have an order an for lab work to be done. Would like for orders to be put in so she can get lab work done before appt

## 2023-03-20 DIAGNOSIS — G4733 Obstructive sleep apnea (adult) (pediatric): Secondary | ICD-10-CM | POA: Diagnosis not present

## 2023-03-24 DIAGNOSIS — E875 Hyperkalemia: Secondary | ICD-10-CM | POA: Diagnosis not present

## 2023-03-24 DIAGNOSIS — E782 Mixed hyperlipidemia: Secondary | ICD-10-CM | POA: Diagnosis not present

## 2023-03-24 DIAGNOSIS — Z5181 Encounter for therapeutic drug level monitoring: Secondary | ICD-10-CM | POA: Diagnosis not present

## 2023-03-25 LAB — CBC WITH DIFFERENTIAL/PLATELET
Basophils Absolute: 0.1 10*3/uL (ref 0.0–0.2)
Basos: 2 %
EOS (ABSOLUTE): 0.1 10*3/uL (ref 0.0–0.4)
Eos: 2 %
Hematocrit: 43.6 % (ref 34.0–46.6)
Hemoglobin: 14 g/dL (ref 11.1–15.9)
Immature Grans (Abs): 0 10*3/uL (ref 0.0–0.1)
Immature Granulocytes: 0 %
Lymphocytes Absolute: 2.3 10*3/uL (ref 0.7–3.1)
Lymphs: 42 %
MCH: 33.3 pg — ABNORMAL HIGH (ref 26.6–33.0)
MCHC: 32.1 g/dL (ref 31.5–35.7)
MCV: 104 fL — ABNORMAL HIGH (ref 79–97)
Monocytes Absolute: 0.6 10*3/uL (ref 0.1–0.9)
Monocytes: 10 %
Neutrophils Absolute: 2.3 10*3/uL (ref 1.4–7.0)
Neutrophils: 44 %
Platelets: 209 10*3/uL (ref 150–450)
RBC: 4.21 x10E6/uL (ref 3.77–5.28)
RDW: 11.8 % (ref 11.7–15.4)
WBC: 5.4 10*3/uL (ref 3.4–10.8)

## 2023-03-25 LAB — COMPREHENSIVE METABOLIC PANEL
ALT: 22 IU/L (ref 0–32)
AST: 21 IU/L (ref 0–40)
Albumin: 4.1 g/dL (ref 3.8–4.8)
Alkaline Phosphatase: 64 IU/L (ref 44–121)
BUN/Creatinine Ratio: 10 — ABNORMAL LOW (ref 12–28)
BUN: 9 mg/dL (ref 8–27)
Bilirubin Total: 0.9 mg/dL (ref 0.0–1.2)
CO2: 22 mmol/L (ref 20–29)
Calcium: 9.5 mg/dL (ref 8.7–10.3)
Chloride: 103 mmol/L (ref 96–106)
Creatinine, Ser: 0.87 mg/dL (ref 0.57–1.00)
Globulin, Total: 2.7 g/dL (ref 1.5–4.5)
Glucose: 105 mg/dL — ABNORMAL HIGH (ref 70–99)
Potassium: 4.8 mmol/L (ref 3.5–5.2)
Sodium: 141 mmol/L (ref 134–144)
Total Protein: 6.8 g/dL (ref 6.0–8.5)
eGFR: 71 mL/min/{1.73_m2} (ref >59)

## 2023-03-25 LAB — LIPID PANEL
Cholesterol, Total: 120 mg/dL (ref 100–199)
HDL: 62 mg/dL (ref >39)
LDL CALC COMMENT:: 1.9 ratio (ref 0.0–4.4)
LDL Chol Calc (NIH): 41 mg/dL (ref 0–99)
Triglycerides: 87 mg/dL (ref 0–149)
VLDL Cholesterol Cal: 17 mg/dL (ref 5–40)

## 2023-03-26 ENCOUNTER — Ambulatory Visit (HOSPITAL_BASED_OUTPATIENT_CLINIC_OR_DEPARTMENT_OTHER): Payer: Medicare HMO | Admitting: Cardiology

## 2023-03-26 ENCOUNTER — Encounter (HOSPITAL_BASED_OUTPATIENT_CLINIC_OR_DEPARTMENT_OTHER): Payer: Self-pay | Admitting: Cardiology

## 2023-03-26 VITALS — BP 102/62 | HR 65 | Ht 64.0 in | Wt 141.0 lb

## 2023-03-26 DIAGNOSIS — D6869 Other thrombophilia: Secondary | ICD-10-CM | POA: Diagnosis not present

## 2023-03-26 DIAGNOSIS — Z7901 Long term (current) use of anticoagulants: Secondary | ICD-10-CM

## 2023-03-26 DIAGNOSIS — E782 Mixed hyperlipidemia: Secondary | ICD-10-CM

## 2023-03-26 DIAGNOSIS — I5042 Chronic combined systolic (congestive) and diastolic (congestive) heart failure: Secondary | ICD-10-CM | POA: Diagnosis not present

## 2023-03-26 DIAGNOSIS — I2510 Atherosclerotic heart disease of native coronary artery without angina pectoris: Secondary | ICD-10-CM

## 2023-03-26 DIAGNOSIS — I4821 Permanent atrial fibrillation: Secondary | ICD-10-CM

## 2023-03-26 DIAGNOSIS — I428 Other cardiomyopathies: Secondary | ICD-10-CM | POA: Diagnosis not present

## 2023-03-26 NOTE — Progress Notes (Signed)
Cardiology Office Note:  .   Date:  03/26/2023  ID:  Rhonda Paul, DOB 05/18/1951, MRN [id number] PCP: Sigmund Hazel, MD  [facility] Providers Cardiologist:  Jodelle Red, MD {  History of Present Illness: Rhonda Paul   Rhonda Paul is a 72 y.o. female with Pmh nonobstructive CAD, embolic CVA, permanent afib, chronic systolic and diastolic heart failure, moderate MR. She was previously seen by Dr. Shari Prows and Dr. Katrinka Blazing prior to that.  Pertinent CV history: Echo with EF 40-45% with severe MR with restricted posterior leaflet, mild RV dysfunction. TEE with moderate MR and EF 45-50%. Medically managed, limited options. Had hypotension with entresto.  Today: Has occasional lightheadedness with changing position, especially bending over and standing up. No syncope. Happens every few weeks. Blood pressure has been trending down, now low 100s/60s at home, consistent with readings today.  ROS: Denies chest pain, shortness of breath at rest or with normal exertion. No PND, orthopnea, LE edema or unexpected weight gain. No syncope or palpitations. ROS otherwise negative except as noted.   Studies Reviewed: Rhonda Paul    EKG:       Physical Exam:   VS:  BP 102/62 (BP Location: Right Arm, Patient Position: Sitting, Cuff Size: Normal)   Pulse 65   Ht 5\' 4"  (1.626 m)   Wt 141 lb (64 kg)   LMP 11/08/2001 (Approximate)   SpO2 99%   BMI 24.20 kg/m    Wt Readings from Last 3 Encounters:  03/26/23 141 lb (64 kg)  12/26/22 145 lb 3.2 oz (65.9 kg)  08/21/22 151 lb 9.6 oz (68.8 kg)    GEN: Well nourished, well developed in no acute distress HEENT: Normal, moist mucous membranes NECK: No JVD CARDIAC: irregularly irregular rhythm, normal S1 and S2, no rubs or gallops. 1/6 HS murmur. VASCULAR: Radial and DP pulses 2+ bilaterally. No carotid bruits RESPIRATORY:  Clear to auscultation without rales, wheezing or rhonchi  ABDOMEN: Soft, non-tender, non-distended MUSCULOSKELETAL:  Ambulates  independently SKIN: Warm and dry, no edema NEUROLOGIC:  Alert and oriented x 3. No focal neuro deficits noted. PSYCHIATRIC:  Normal affect    ASSESSMENT AND PLAN: .    Nonischemic cardiomyopathy Chronic systolic and diastolic heart failure Moderate mitral regurgitation -NYHA class I -low blood pressure limits medical therapy. Has been on metoprolol for a long time. Started Marcelline Deist this spring. If she becomes symptomatic with her low BP, would stop Farxiga  Permanent atrial fibrillation -CHA2DS2/VAS Stroke Risk Points=7  -on apixaban  Nonobstructive CAD Mixed hyperlipidemia -cardiac PET without ischemia. EF 58% -continue atorvastatin, ezetimibe. Lipids at goal -no aspirin as she is on apixaban  CV risk counseling and prevention -recommend heart healthy/Mediterranean diet, with whole grains, fruits, vegetable, fish, lean meats, nuts, and olive oil. Limit salt. -recommend moderate walking, 3-5 times/week for 30-50 minutes each session. Aim for at least 150 minutes.week. Goal should be pace of 3 miles/hours, or walking 1.5 miles in 30 minutes -recommend avoidance of tobacco products. Avoid excess alcohol.  Dispo: 6 months  Signed, Jodelle Red, MD   Jodelle Red, MD, PhD, [facility]  [facility]  [place]  [facility] at [facility] [address], [address] [place], [place] [address] [phone]

## 2023-03-26 NOTE — Patient Instructions (Signed)
Medication Instructions:  NO CHanGES *If you need a refill on your cardiac medications before your next appointment, please call your pharmacy*   Lab Work: NONE If you have labs (blood work) drawn today and your tests are completely normal, you will receive your results only by: MyChart Message (if you have MyChart) OR A paper copy in the mail If you have any lab test that is abnormal or we need to change your treatment, we will call you to review the results.   Testing/Procedures: NONE   Follow-Up: At [facility], you and your health needs are our priority.  As part of our continuing mission to provide you with exceptional heart care, we have created designated Provider Care Teams.  These Care Teams include your primary Cardiologist (physician) and Advanced Practice Providers (APPs -  Physician Assistants and Nurse Practitioners) who all work together to provide you with the care you need, when you need it.  We recommend signing up for the patient portal called "MyChart".  Sign up information is provided on this After Visit Summary.  MyChart is used to connect with patients for Virtual Visits (Telemedicine).  Patients are able to view lab/test results, encounter notes, upcoming appointments, etc.  Non-urgent messages can be sent to your provider as well.   To learn more about what you can do with MyChart, go to [email or web address].    Your next appointment:   6 month(s)  Provider:   Jodelle Red, MD    Other Instructions NONE

## 2023-03-31 ENCOUNTER — Other Ambulatory Visit: Payer: Self-pay

## 2023-03-31 MED ORDER — EZETIMIBE 10 MG PO TABS: 10.0000 mg | ORAL_TABLET | Freq: Every day | ORAL | 3 refills | Status: DC

## 2023-04-03 ENCOUNTER — Other Ambulatory Visit: Payer: Self-pay | Admitting: Family Medicine

## 2023-04-03 DIAGNOSIS — Z1231 Encounter for screening mammogram for malignant neoplasm of breast: Secondary | ICD-10-CM

## 2023-04-09 ENCOUNTER — Other Ambulatory Visit: Payer: Self-pay | Admitting: Family Medicine

## 2023-04-09 DIAGNOSIS — Z853 Personal history of malignant neoplasm of breast: Secondary | ICD-10-CM

## 2023-04-30 ENCOUNTER — Ambulatory Visit
Admission: RE | Admit: 2023-04-30 | Discharge: 2023-04-30 | Disposition: A | Discharge status: Home / Self Care | Payer: Medicare HMO | Source: Ambulatory Visit | Attending: Family Medicine | Admitting: Family Medicine

## 2023-04-30 ENCOUNTER — Other Ambulatory Visit: Payer: Self-pay

## 2023-04-30 DIAGNOSIS — Z1231 Encounter for screening mammogram for malignant neoplasm of breast: Secondary | ICD-10-CM | POA: Diagnosis not present

## 2023-04-30 MED ORDER — ATORVASTATIN CALCIUM 40 MG PO TABS: ORAL_TABLET | ORAL | 3 refills | Status: DC

## 2023-05-05 ENCOUNTER — Other Ambulatory Visit: Payer: Self-pay | Admitting: Family Medicine

## 2023-05-05 DIAGNOSIS — R928 Other abnormal and inconclusive findings on diagnostic imaging of breast: Secondary | ICD-10-CM

## 2023-05-15 ENCOUNTER — Ambulatory Visit
Admission: RE | Admit: 2023-05-15 | Discharge: 2023-05-15 | Disposition: A | Discharge status: Home / Self Care | Payer: Medicare HMO | Source: Ambulatory Visit | Attending: Family Medicine | Admitting: Family Medicine

## 2023-05-15 ENCOUNTER — Other Ambulatory Visit: Payer: Self-pay | Admitting: Family Medicine

## 2023-05-15 DIAGNOSIS — R928 Other abnormal and inconclusive findings on diagnostic imaging of breast: Secondary | ICD-10-CM

## 2023-05-15 DIAGNOSIS — N6315 Unspecified lump in the right breast, overlapping quadrants: Secondary | ICD-10-CM | POA: Diagnosis not present

## 2023-05-15 DIAGNOSIS — R921 Mammographic calcification found on diagnostic imaging of breast: Secondary | ICD-10-CM | POA: Diagnosis not present

## 2023-05-15 DIAGNOSIS — N6310 Unspecified lump in the right breast, unspecified quadrant: Secondary | ICD-10-CM

## 2023-05-19 ENCOUNTER — Ambulatory Visit
Admission: RE | Admit: 2023-05-19 | Discharge: 2023-05-19 | Disposition: A | Discharge status: Home / Self Care | Payer: Medicare HMO | Source: Ambulatory Visit | Attending: Family Medicine | Admitting: Family Medicine

## 2023-05-19 DIAGNOSIS — R928 Other abnormal and inconclusive findings on diagnostic imaging of breast: Secondary | ICD-10-CM

## 2023-05-19 DIAGNOSIS — Z853 Personal history of malignant neoplasm of breast: Secondary | ICD-10-CM | POA: Diagnosis not present

## 2023-05-19 DIAGNOSIS — R921 Mammographic calcification found on diagnostic imaging of breast: Secondary | ICD-10-CM | POA: Diagnosis not present

## 2023-05-19 DIAGNOSIS — N6315 Unspecified lump in the right breast, overlapping quadrants: Secondary | ICD-10-CM | POA: Diagnosis not present

## 2023-05-19 DIAGNOSIS — C50811 Malignant neoplasm of overlapping sites of right female breast: Secondary | ICD-10-CM | POA: Diagnosis not present

## 2023-05-19 DIAGNOSIS — N6310 Unspecified lump in the right breast, unspecified quadrant: Secondary | ICD-10-CM

## 2023-05-19 HISTORY — PX: BREAST BIOPSY: SHX20

## 2023-05-20 ENCOUNTER — Encounter: Payer: Self-pay | Admitting: *Deleted

## 2023-05-20 LAB — SURGICAL PATHOLOGY

## 2023-05-20 NOTE — Progress Notes (Signed)
Reached out to Bradley Ferris to introduce myself as the office RN Navigator and explain our new patient process. Reviewed the reason for their referral and scheduled their new patient appointment along with labs. Provided address and directions to the office including call back phone number. Reviewed with patient any concerns they may have or any possible barriers to attending their appointment.   Informed patient about my role as a navigator and that I will meet with them prior to their New Patient appointment and more fully discuss what services I can provide. At this time patient has no further questions or needs.    *Patient is a previous patient of this office, last seen in 2019. She was treated for R Breast Stage IIA IDC, diagnosed in 2003.   Oncology Nurse Navigator Documentation     05/20/2023   11:00 AM  Oncology Nurse Navigator Flowsheets  Abnormal Finding Date 05/02/2023  Confirmed Diagnosis Date 05/19/2023  Navigator Follow Up Date: 05/28/2023  Navigator Follow Up Reason: New Patient Appointment  Navigator Location [facility]  Referral Date to RadOnc/MedOnc 05/20/2023  Navigator Encounter Type Introductory Phone Call  Patient Visit Type MedOnc  Treatment Phase Pre-Tx/Tx Discussion  Barriers/Navigation Needs Coordination of Care;Education  Education Other  Interventions Coordination of Care;Education  Acuity Level 2-Minimal Needs (1-2 Barriers Identified)  Coordination of Care Appts  Education Method Verbal  Time Spent with Patient 30

## 2023-05-21 ENCOUNTER — Other Ambulatory Visit (HOSPITAL_BASED_OUTPATIENT_CLINIC_OR_DEPARTMENT_OTHER): Payer: Self-pay | Admitting: Obstetrics & Gynecology

## 2023-05-26 ENCOUNTER — Telehealth (HOSPITAL_BASED_OUTPATIENT_CLINIC_OR_DEPARTMENT_OTHER): Payer: Self-pay

## 2023-05-26 ENCOUNTER — Telehealth: Payer: Self-pay | Admitting: Radiation Oncology

## 2023-05-26 ENCOUNTER — Ambulatory Visit: Payer: Self-pay | Admitting: Surgery

## 2023-05-26 DIAGNOSIS — C50911 Malignant neoplasm of unspecified site of right female breast: Secondary | ICD-10-CM | POA: Diagnosis not present

## 2023-05-26 NOTE — Telephone Encounter (Signed)
LVM to schedule CON with Dr. Lisbeth Renshaw

## 2023-05-26 NOTE — Telephone Encounter (Signed)
   Pre-operative Risk Assessment    Patient Name: Rhonda Paul  DOB: [birth year] MRN: [id number]      Request for Surgical Clearance    Procedure:   Breast Surgery  Date of Surgery:  Clearance TBD                                 Surgeon:  Harriette Bouillon, MD Surgeon's Group or Practice Name:  [facility] Phone number:  [phone] Fax number:  [phone] (Attention Brennan Bailey, CMA)   Type of Clearance Requested:   - Medical  - Pharmacy:  Hold Apixaban (Eliquis) how long?   Type of Anesthesia:  General      Signed, Gabriel Rung   05/26/2023, 3:37 PM

## 2023-05-27 NOTE — Telephone Encounter (Signed)
Per Dr. Rosezena Sensor ov notes:  She sees oncology this week. When she has seen the consultants, we can put a plan together either to proceed with mastectomy or consider lumpectomy with consideration of sentinel lymph node mapping in both situations.  I will call their office in the morning and confirm procedure to be done.

## 2023-05-28 ENCOUNTER — Encounter: Payer: Self-pay | Admitting: *Deleted

## 2023-05-28 ENCOUNTER — Encounter: Payer: Self-pay | Admitting: Hematology & Oncology

## 2023-05-28 ENCOUNTER — Inpatient Hospital Stay: Payer: Medicare HMO | Admitting: Hematology & Oncology

## 2023-05-28 ENCOUNTER — Inpatient Hospital Stay: Payer: Medicare HMO | Attending: Hematology & Oncology

## 2023-05-28 VITALS — BP 108/64 | HR 54 | Temp 98.1°F | Resp 18 | Ht 65.0 in | Wt 142.0 lb

## 2023-05-28 DIAGNOSIS — Z1731 Human epidermal growth factor receptor 2 positive status: Secondary | ICD-10-CM | POA: Diagnosis not present

## 2023-05-28 DIAGNOSIS — Z1722 Progesterone receptor negative status: Secondary | ICD-10-CM | POA: Insufficient documentation

## 2023-05-28 DIAGNOSIS — C50111 Malignant neoplasm of central portion of right female breast: Secondary | ICD-10-CM

## 2023-05-28 DIAGNOSIS — C50011 Malignant neoplasm of nipple and areola, right female breast: Secondary | ICD-10-CM

## 2023-05-28 DIAGNOSIS — Z170 Estrogen receptor positive status [ER+]: Secondary | ICD-10-CM

## 2023-05-28 DIAGNOSIS — I214 Non-ST elevation (NSTEMI) myocardial infarction: Secondary | ICD-10-CM

## 2023-05-28 DIAGNOSIS — I4821 Permanent atrial fibrillation: Secondary | ICD-10-CM

## 2023-05-28 LAB — CBC WITH DIFFERENTIAL (CANCER CENTER ONLY)
Abs Immature Granulocytes: 0.01 10*3/uL (ref 0.00–0.07)
Basophils Absolute: 0.1 10*3/uL (ref 0.0–0.1)
Basophils Relative: 1 %
Eosinophils Absolute: 0.1 10*3/uL (ref 0.0–0.5)
Eosinophils Relative: 3 %
HCT: 40.2 % (ref 36.0–46.0)
Hemoglobin: 13.2 g/dL (ref 12.0–15.0)
Immature Granulocytes: 0 %
Lymphocytes Relative: 34 %
Lymphs Abs: 1.8 10*3/uL (ref 0.7–4.0)
MCH: 32.4 pg (ref 26.0–34.0)
MCHC: 32.8 g/dL (ref 30.0–36.0)
MCV: 98.8 fL (ref 80.0–100.0)
Monocytes Absolute: 0.6 10*3/uL (ref 0.1–1.0)
Monocytes Relative: 11 %
Neutro Abs: 2.7 10*3/uL (ref 1.7–7.7)
Neutrophils Relative %: 51 %
Platelet Count: 213 10*3/uL (ref 150–400)
RBC: 4.07 MIL/uL (ref 3.87–5.11)
RDW: 12.9 % (ref 11.5–15.5)
WBC Count: 5.2 10*3/uL (ref 4.0–10.5)
nRBC: 0 % (ref 0.0–0.2)

## 2023-05-28 LAB — CMP (CANCER CENTER ONLY)
ALT: 19 U/L (ref 0–44)
AST: 16 U/L (ref 15–41)
Albumin: 4.1 g/dL (ref 3.5–5.0)
Alkaline Phosphatase: 56 U/L (ref 38–126)
Anion gap: 8 (ref 5–15)
BUN: 11 mg/dL (ref 8–23)
CO2: 29 mmol/L (ref 22–32)
Calcium: 9.9 mg/dL (ref 8.9–10.3)
Chloride: 102 mmol/L (ref 98–111)
Creatinine: 0.89 mg/dL (ref 0.44–1.00)
GFR, Estimated: >60 mL/min (ref >60)
Glucose, Bld: 106 mg/dL — ABNORMAL HIGH (ref 70–99)
Potassium: 5.5 mmol/L — ABNORMAL HIGH (ref 3.5–5.1)
Sodium: 139 mmol/L (ref 135–145)
Total Bilirubin: 1.4 mg/dL — ABNORMAL HIGH (ref <1.2)
Total Protein: 7.2 g/dL (ref 6.5–8.1)

## 2023-05-28 NOTE — Telephone Encounter (Signed)
Surgeon's office called and left vm. Procedure has not been determined yet, until the pt see her oncologist this week. However, will be one of the two procedures listed in the notes yesterday, either a mastectomy or lumpectomy. Surgeon office stated regardless of either procedure they will need clearance.   I will forward now to our preop team for review.

## 2023-05-28 NOTE — Progress Notes (Signed)
Referral MD  Reason for Referral: Infiltrating ductal carcinoma of the right breast- ER+/PR-/HER2+  Chief Complaint  Patient presents with   New Patient (Initial Visit)  : I have another breast cancer.  HPI: Rhonda Paul is actually well-known to me.  She is a very nice 72 year old close menopausal white female.  I actually had seen her about 20 years ago so ago.  She had a triple negative breast cancer of the right breast.  It was stage I.  She had a lumpectomy followed by adjuvant chemotherapy with FEC x 6 cycles.  She had adjuvant radiation therapy.  , Now, she appears to have a new breast cancer.  She has screening mammogram 04/30/2023.  This shows some scattered calcification on the right breast.  A diagnostic mammogram was done on 05/15/2023.  This showed calcifications in the lower central right breast measuring proximal 1.1 cm.  She had a ultrasound which showed calcifications measuring 1.3 x 0.7 x 1.2 cm.  She underwent a ultrasound-guided biopsy.  This was done on 05/19/2023.  The pathology report ([id number]) showed an invasive ductal carcinoma.  It was overall grade 2.  There is no lymphovascular space invasion.  The prognostic markers show that the tumor was ER positive/PR negative/HER2 positive.  Surprising, the Ki67 was only 15%.  She did not note any change in the right breast.  There is no pain.  She has had no headache.  She has had no change in bowel or bladder habits.  She has had no weight loss or weight gain.  She has had no rashes.  There is been no issues with COVID.  Last on that we saw her was back in 2019.  She is now retired.  She comes in with her husband.  She has seen surgery.  She is going to meet with Radiation Oncology.  Overall, I would say her performance status is probably ECOG 0.    Past Medical History:  Diagnosis Date   Adenocarcinoma of breast (HCC) 06/03   RIGHT   Arthritis    Chronic anticoagulation    Eliquis   Diverticulitis 2009    diverticulitis   Heart attack (HCC)    HOH (hard of hearing)    right ear; wears hearing aid   Hypertension    Infertility, female 09/01   4 SAB   Metrorrhagia    Migraine 09/01   Permanent atrial fibrillation (HCC) 04/01/2014   Personal history of chemotherapy 2003   Personal history of radiation therapy 2003   STD (sexually transmitted disease) 1976   Hx of HSV   Stroke (HCC) 01/03/15   s/p TPA R-MCA   Vitamin D deficiency 07/2006  :   Past Surgical History:  Procedure Laterality Date   APPENDECTOMY  1959   BREAST BIOPSY Right 05/19/2023   [place] RT BREAST BX W LOC DEV 1ST LESION IMG BX SPEC US GUIDE 05/19/2023 [facility]   BREAST LUMPECTOMY Right    BREAST SURGERY Right 11/2001   lumpectomy with sentinel node biopsy X 3 all negative, ER/PR negative , chemo 6 doses, and radiation daily X 6 weeks   BUBBLE STUDY  04/18/2022   Procedure: BUBBLE STUDY;  Surgeon: Christell Constant, MD;  Location: [facility];  Service: Cardiovascular;;   CARDIAC CATHETERIZATION N/A 01/16/2015   Procedure: Left Heart Cath and Coronary Angiography;  Surgeon: Lyn Records, MD; OM1 75%, RCA 30%, EF normal; spasm in RCA w/ catheter engagement    COLONOSCOPY  11/2012   diverticulitis,  2 polyps negative, repeat in 5 years   HALLUX FUSION Left 03/03/2017   Procedure: HALLUX RIGIDUS CORRECTION WITH COLECTOMY DEBRIDEMENT AND CAPSULAR RELEASE OF THE FIRST MPJ WITH IMPLANT LEFT FOOT;  Surgeon: Larey Dresser, DPM;  Location: [facility];  Service: Podiatry;  Laterality: Left;   JOINT REPLACEMENT Left 02/2017   big toe   PORT-A-CATH REMOVAL     RADIOLOGY WITH ANESTHESIA N/A 01/03/2015   Procedure: RADIOLOGY WITH ANESTHESIA;  Surgeon: Medication Radiologist, MD;  Location: [facility] OR;  Service: Radiology;  Laterality: N/A;   TEE WITHOUT CARDIOVERSION N/A 04/18/2022   Procedure: TRANSESOPHAGEAL ECHOCARDIOGRAM (TEE);  Surgeon: Christell Constant, MD;  Location: [facility] END[facility]COPY;  Service:  Cardiovascular;  Laterality: N/A;  :   Current Outpatient Medications:    apixaban (ELIQUIS) 5 MG TABS tablet, Take 1 tablet (5 mg total) by mouth 2 (two) times daily., Disp: 60 tablet, Rfl: 5   atorvastatin (LIPITOR) 40 MG tablet, TAKE ONE TABLET BY MOUTH DAILY, Disp: 90 tablet, Rfl: 3   Biotin w/ Vitamins C & E (HAIR/SKIN/NAILS PO), Take 1 capsule by mouth daily., Disp: , Rfl:    dapagliflozin propanediol (FARXIGA) 10 MG TABS tablet, Take 1 tablet (10 mg total) by mouth daily before breakfast., Disp: 90 tablet, Rfl: 3   ezetimibe (ZETIA) 10 MG tablet, Take 1 tablet (10 mg total) by mouth daily., Disp: 90 tablet, Rfl: 3   ipratropium (ATROVENT) 0.03 % nasal spray, Place 1 spray into both nostrils daily., Disp: , Rfl:    levothyroxine (SYNTHROID) 50 MCG tablet, TAKE 1 TABLET BY MOUTH DAILY, Disp: 90 tablet, Rfl: 2   metoprolol succinate (TOPROL-XL) 50 MG 24 hr tablet, Take 1 tablet (50 mg total) by mouth daily. Take with or immediately following a meal., Disp: 90 tablet, Rfl: 0   Biotin 5000 MCG CHEW, Chew by mouth., Disp: , Rfl:    nitroGLYCERIN (NITROSTAT) 0.4 MG SL tablet, Place 1 tablet (0.4 mg total) under the tongue every 5 (five) minutes as needed for chest pain. (Patient not taking: Reported on 05/28/2023), Disp: 25 tablet, Rfl: 3:  :   Allergies  Allergen Reactions   Lambs Quarters Hives, Itching and Nausea And Vomiting   Entresto [Sacubitril-Valsartan]     Low BP   Lanolin Acid [Lanolin] Rash   Other Itching and Rash    wool  :   Family History  Problem Relation Age of Onset   Colon cancer Mother    Cervical cancer Mother    Osteoporosis Mother    Cancer Mother    Heart disease Father    Heart disease Brother    Breast cancer Maternal Aunt    Cancer Maternal Aunt    Multiple births Maternal Grandmother    Multiple births Paternal Grandmother   :   Social History   Socioeconomic History   Marital status: Married    Spouse name: Not on file   Number of  children: Not on file   Years of education: Not on file   Highest education level: Not on file  Occupational History   Not on file  Tobacco Use   Smoking status: Never   Smokeless tobacco: Never   Tobacco comments:    never used tobacco  Vaping Use   Vaping status: Never Used  Substance and Sexual Activity   Alcohol use: Yes    Alcohol/week: 7.0 standard drinks of alcohol    Types: 7 Glasses of wine per week   Drug use: No  Sexual activity: Yes    Partners: Male    Birth control/protection: Post-menopausal  Other Topics Concern   Not on file  Social History Narrative   Not on file   Social Drivers of Health   Financial Resource Strain: Not on file  Food Insecurity: No Food Insecurity (05/28/2023)   Hunger Vital Sign    Worried About Running Out of Food in the Last Year: Never true    Ran Out of Food in the Last Year: Never true  Transportation Needs: No Transportation Needs (05/28/2023)   PRAPARE - Administrator, Civil Service (Medical): No    Lack of Transportation (Non-Medical): No  Physical Activity: Not on file  Stress: Not on file  Social Connections: Not on file  Intimate Partner Violence: Not on file  :  Review of Systems  Constitutional: Negative.   HENT: Negative.    Eyes: Negative.   Respiratory: Negative.    Cardiovascular: Negative.   Gastrointestinal: Negative.   Genitourinary: Negative.   Musculoskeletal: Negative.   Skin: Negative.   Neurological: Negative.   Endo/Heme/Allergies: Negative.   Psychiatric/Behavioral: Negative.       Exam: Vital signs are temperature of 98.1.  Pulse 54.  Blood pressure 108/64.  Weight is 142 pounds.  @IPVITALS @ Physical Exam Vitals reviewed.  Constitutional:      Comments: Her left breast is without masses, edema or erythema.  There is no left axillary adenopathy.  Right breast does show the well-healed lumpectomy scar at about the 11 o'clock position.  She has the biopsy site with some  ecchymoses at about the 4 o'clock position.  There is no obvious mass in the right breast.  There is no right axillary adenopathy.  HENT:     Head: Normocephalic and atraumatic.  Eyes:     Pupils: Pupils are equal, round, and reactive to light.  Cardiovascular:     Rate and Rhythm: Normal rate and regular rhythm.     Heart sounds: Normal heart sounds.  Pulmonary:     Effort: Pulmonary effort is normal.     Breath sounds: Normal breath sounds.  Abdominal:     General: Bowel sounds are normal.     Palpations: Abdomen is soft.  Musculoskeletal:        General: No tenderness or deformity. Normal range of motion.     Cervical back: Normal range of motion.  Lymphadenopathy:     Cervical: No cervical adenopathy.  Skin:    General: Skin is warm and dry.     Findings: No erythema or rash.  Neurological:     Mental Status: She is alert and oriented to person, place, and time.  Psychiatric:        Behavior: Behavior normal.        Thought Content: Thought content normal.        Judgment: Judgment normal.     Recent Labs    05/28/23 1104  WBC 5.2  HGB 13.2  HCT 40.2  PLT 213    Recent Labs    05/28/23 1104  NA 139  K 5.5*  CL 102  CO2 29  GLUCOSE 106*  BUN 11  CREATININE 0.89  CALCIUM 9.9    Blood smear review: None  Pathology: See above    Assessment and Plan: Ms. Rhonda Paul is a very charming 72 year old postmenopausal white female.  She now has a second breast cancer in the right breast.  I would have to think it had the only treatment  option for her surgically would be a mastectomy.  She has had a radio therapy.  I cannot imagine that she could have any additional radiation therapy.  She clearly would be a candidate for adjuvant chemotherapy and anti-HER2 therapy.  She will need to have a Port-A-Cath put in.  I would treat her with Taxotere/carboplatinum.  I would use it for 4 cycles.  I would use Herceptin/Perjeta.  I then might consider Kadcyla for a  maintenance protocol.  She will need to have a echocardiogram.  She said that she had an echocardiogram a couple months ago.  The last echocardiogram that I see on her was back in April.  We probably will have to have this repeated.  I will let Dr. Luisa Hart know that I saw her.  Again, I would think that she is going need to have a mastectomy.  I would like to hope that she does not have any positive lymph nodes.  I had a long talk with she and her husband.  She understands that she is going need to have chemotherapy and anti-HER2 therapy.  This would be considered standard of care.  She will let us know when surgery will be scheduled.  Once I know when her surgery is going to be scheduled, then we will get her back for follow-up and then plan for her adjuvant protocol.

## 2023-05-28 NOTE — Telephone Encounter (Signed)
I s/w Dr. Rosezena Sensor office to try an confirm actual procedure. Left verbal message for nurse to call back today if possible before 12 noon as our phone lines will be shut off as we are only in the office today 1/2 day.

## 2023-05-28 NOTE — Progress Notes (Signed)
New Breast Cancer Diagnosis: Right Breast UIQ   Histology per Pathology Report: grade 2, Invasive Ductal Carcinoma 05/19/2023  Receptor Status: ER(positive), PR (negative), Her2-neu (positive), Ki-(15%)   Surgeon and surgical plan, if any:  Dr. Luisa Hart 05/26/2023 -I explained given her previous radiation therapy that mastectomy is standard of care at this point but there is data to support repeat lumpectomy especially women over the age of 28 with favorable characteristics. She understands this and is open to mastectomy but like to explore options prior to committing to that path.    Medical oncologist, treatment if any:   Dr. Myna Hidalgo 05/28/2023 -  Family History of Breast/Ovarian/Prostate Cancer:   Lymphedema issues, if any:      Pain issues, if any:     SAFETY ISSUES: Prior radiation? Right Breast 2004 Pacemaker/ICD?  Possible current pregnancy? Postmenopausal Is the patient on methotrexate?   Current Complaints / other details:   Right breast treated in 2004 with breast conserving surgery, radiation therapy, and chemotherapy.

## 2023-05-28 NOTE — Progress Notes (Signed)
Initial RN Navigator Patient Visit  Name: Rhonda Paul Date of Referral : 05/20/2023 Diagnosis: R Breast IDC ER+ PR- HER2+  Met with patient prior to their visit with MD. Jovita Gamma patient "Your Patient Navigator" handout which explains my role, areas in which I am able to help, and all the contact information for myself and the office. Also gave patient MD and Navigator business card. Reviewed with patient the general overview of expected course after initial diagnosis and time frame for all steps to be completed.  New patient packet given to patient which includes: orientation to office and staff; campus directory; education on My Chart and Advance Directives; and patient centered education on breast cancer.   Patient was seen by surgery. Patient is considering her options including lumpectomy vs mastectomy. She is scheduled to see RadOnc tomorrow.   Will place nutrition and social work consult per protocol.   Will follow up tomorrow once office note dictated and orders (if any) are placed.   Patient understands all follow up procedures and expectations. They have my number to reach out for any further clarification or additional needs.    Oncology Nurse Navigator Documentation     05/28/2023   11:00 AM  Oncology Nurse Navigator Flowsheets  Navigator Follow Up Date: 05/29/2023  Navigator Follow Up Reason: Appointment Review  Navigator Location [facility]  Navigator Encounter Type Initial MedOnc  Patient Visit Type MedOnc  Treatment Phase Pre-Tx/Tx Discussion  Barriers/Navigation Needs Coordination of Care;Education  Education Newly Diagnosed Cancer Education;Pain/ Symptom Management;Preparing for Upcoming Surgery/ Treatment  Interventions Education;Referrals  Acuity Level 2-Minimal Needs (1-2 Barriers Identified)  Referrals Nutrition/dietician;Social Work  Nature conservation officer Groups/Services Friends and Family  Time Spent with Patient 30

## 2023-05-29 ENCOUNTER — Ambulatory Visit
Admission: RE | Admit: 2023-05-29 | Discharge: 2023-05-29 | Disposition: A | Discharge status: Home / Self Care | Payer: Medicare HMO | Source: Ambulatory Visit | Attending: Radiation Oncology | Admitting: Radiation Oncology

## 2023-05-29 ENCOUNTER — Encounter: Payer: Self-pay | Admitting: *Deleted

## 2023-05-29 ENCOUNTER — Other Ambulatory Visit: Payer: Self-pay | Admitting: *Deleted

## 2023-05-29 ENCOUNTER — Ambulatory Visit: Payer: Self-pay | Admitting: Surgery

## 2023-05-29 VITALS — BP 111/64 | HR 64 | Temp 97.8°F | Resp 18 | Ht 65.0 in | Wt 143.8 lb

## 2023-05-29 DIAGNOSIS — Z923 Personal history of irradiation: Secondary | ICD-10-CM | POA: Insufficient documentation

## 2023-05-29 DIAGNOSIS — Z79890 Hormone replacement therapy (postmenopausal): Secondary | ICD-10-CM | POA: Insufficient documentation

## 2023-05-29 DIAGNOSIS — Z79899 Other long term (current) drug therapy: Secondary | ICD-10-CM | POA: Insufficient documentation

## 2023-05-29 DIAGNOSIS — Z9221 Personal history of antineoplastic chemotherapy: Secondary | ICD-10-CM | POA: Diagnosis not present

## 2023-05-29 DIAGNOSIS — I252 Old myocardial infarction: Secondary | ICD-10-CM | POA: Insufficient documentation

## 2023-05-29 DIAGNOSIS — C50011 Malignant neoplasm of nipple and areola, right female breast: Secondary | ICD-10-CM

## 2023-05-29 DIAGNOSIS — I10 Essential (primary) hypertension: Secondary | ICD-10-CM | POA: Diagnosis not present

## 2023-05-29 DIAGNOSIS — C50811 Malignant neoplasm of overlapping sites of right female breast: Secondary | ICD-10-CM | POA: Diagnosis not present

## 2023-05-29 DIAGNOSIS — Z170 Estrogen receptor positive status [ER+]: Secondary | ICD-10-CM | POA: Insufficient documentation

## 2023-05-29 DIAGNOSIS — Z803 Family history of malignant neoplasm of breast: Secondary | ICD-10-CM | POA: Insufficient documentation

## 2023-05-29 DIAGNOSIS — Z8673 Personal history of transient ischemic attack (TIA), and cerebral infarction without residual deficits: Secondary | ICD-10-CM | POA: Diagnosis not present

## 2023-05-29 DIAGNOSIS — E559 Vitamin D deficiency, unspecified: Secondary | ICD-10-CM | POA: Insufficient documentation

## 2023-05-29 DIAGNOSIS — Z800 Family history of malignant neoplasm of digestive organs: Secondary | ICD-10-CM | POA: Diagnosis not present

## 2023-05-29 DIAGNOSIS — Z7901 Long term (current) use of anticoagulants: Secondary | ICD-10-CM | POA: Diagnosis not present

## 2023-05-29 DIAGNOSIS — I4821 Permanent atrial fibrillation: Secondary | ICD-10-CM | POA: Insufficient documentation

## 2023-05-29 DIAGNOSIS — Z7984 Long term (current) use of oral hypoglycemic drugs: Secondary | ICD-10-CM | POA: Insufficient documentation

## 2023-05-29 LAB — CANCER ANTIGEN 27.29: CA 27.29: 17 U/mL (ref 0.0–38.6)

## 2023-05-29 NOTE — Progress Notes (Signed)
Plan for patient will be mastectomy with adjuvant treatment with chemo and anti-Her2 therapy. She is not a candidate for radiation given her past radiation to the same breast.   Dr Myna Hidalgo spoke to Dr Luisa Hart and requested a port be placed during her mastectomy.   She will also need a pre-treatment echo.   Will follow for surgical scheduling.   Oncology Nurse Navigator Documentation     05/29/2023    8:45 AM  Oncology Nurse Navigator Flowsheets  Navigator Follow Up Date: 06/03/2023  Navigator Follow Up Reason: Appointment Review  Navigator Location [facility]  Navigator Encounter Type Appt/Treatment Plan Review  Patient Visit Type MedOnc  Treatment Phase Pre-Tx/Tx Discussion  Barriers/Navigation Needs Coordination of Care;Education  Interventions None Required  Acuity Level 2-Minimal Needs (1-2 Barriers Identified)  Support Groups/Services Friends and Family  Time Spent with Patient 15

## 2023-05-29 NOTE — Progress Notes (Signed)
Radiation Oncology         [phone] ________________________________  Name: Rhonda Paul        MRN: [id number]  Date of Service: 05/29/2023 DOB: May 24, 1951  WU:[id number], Misty Stanley, MD  Harriette Bouillon, MD     REFERRING PHYSICIAN: Harriette Bouillon, MD   DIAGNOSIS: The encounter diagnosis was Malignant neoplasm of overlapping sites of right breast in female, estrogen receptor positive (HCC).   HISTORY OF PRESENT ILLNESS: Rhonda Paul is a 72 y.o. female who had a Stage II, cT2N0M0, triple negative invasive ductal carcinoma of the right breast, treated approximately 20 years ago with lumpectomy, adjuvant chemotherapy, and adjuvant whole breast radiation with Dr. Dayton Scrape.    She was recently found to have a screening detected abnormality in the right breast on 04/30/2023 and further diagnostic workup on 05/15/2023 showed a 1.3 cm mass in the 6 o'clock position of the right breast with associated calcifications.  The axilla was negative for adenopathy.  She underwent biopsy on 05/19/2023 which showed grade 2 invasive ductal carcinoma with associated intermediate grade DCIS, her cancer was ER positive PR negative, HER2 amplified with a KI 16 of 15%.  Given these findings she met with Dr. Luisa Hart.  He has recommended mastectomy, and Dr. Myna Hidalgo has recommended adjuvant chemotherapy.  She is seen today to discuss possibilities of reirradiation.   PREVIOUS RADIATION THERAPY: Yes   04/12/02-05/27/02: The patient's right breast was treated to 46.8 Gy in 26 fractions followed by electron boost to 14 Gy in 7 fractions with Dr. Dayton Scrape.   PAST MEDICAL HISTORY:  Past Medical History:  Diagnosis Date   Adenocarcinoma of breast (HCC) 06/03   RIGHT   Arthritis    Chronic anticoagulation    Eliquis   Diverticulitis 2009   diverticulitis   Heart attack (HCC)    HOH (hard of hearing)    right ear; wears hearing aid   Hypertension    Infertility, female 09/01   4 SAB   Metrorrhagia    Migraine  09/01   Permanent atrial fibrillation (HCC) 04/01/2014   Personal history of chemotherapy 2003   Personal history of radiation therapy 2003   STD (sexually transmitted disease) 1976   Hx of HSV   Stroke (HCC) 01/03/15   s/p TPA R-MCA   Vitamin D deficiency 07/2006       PAST SURGICAL HISTORY: Past Surgical History:  Procedure Laterality Date   APPENDECTOMY  1959   BREAST BIOPSY Right 05/19/2023   [place] RT BREAST BX W LOC DEV 1ST LESION IMG BX SPEC US GUIDE 05/19/2023 [facility]   BREAST LUMPECTOMY Right    BREAST SURGERY Right 11/2001   lumpectomy with sentinel node biopsy X 3 all negative, ER/PR negative , chemo 6 doses, and radiation daily X 6 weeks   BUBBLE STUDY  04/18/2022   Procedure: BUBBLE STUDY;  Surgeon: Christell Constant, MD;  Location: [facility];  Service: Cardiovascular;;   CARDIAC CATHETERIZATION N/A 01/16/2015   Procedure: Left Heart Cath and Coronary Angiography;  Surgeon: Lyn Records, MD; OM1 75%, RCA 30%, EF normal; spasm in RCA w/ catheter engagement    COLONOSCOPY  11/2012   diverticulitis, 2 polyps negative, repeat in 5 years   HALLUX FUSION Left 03/03/2017   Procedure: HALLUX RIGIDUS CORRECTION WITH COLECTOMY DEBRIDEMENT AND CAPSULAR RELEASE OF THE FIRST MPJ WITH IMPLANT LEFT FOOT;  Surgeon: Larey Dresser, DPM;  Location: [facility];  Service: Podiatry;  Laterality: Left;   JOINT REPLACEMENT Left  02/2017   big toe   PORT-A-CATH REMOVAL     RADIOLOGY WITH ANESTHESIA N/A 01/03/2015   Procedure: RADIOLOGY WITH ANESTHESIA;  Surgeon: Medication Radiologist, MD;  Location: [facility] OR;  Service: Radiology;  Laterality: N/A;   TEE WITHOUT CARDIOVERSION N/A 04/18/2022   Procedure: TRANSESOPHAGEAL ECHOCARDIOGRAM (TEE);  Surgeon: Christell Constant, MD;  Location: [facility];  Service: Cardiovascular;  Laterality: N/A;     FAMILY HISTORY:  Family History  Problem Relation Age of Onset   Colon cancer Mother    Cervical cancer Mother     Osteoporosis Mother    Cancer Mother    Heart disease Father    Heart disease Brother    Breast cancer Maternal Aunt    Cancer Maternal Aunt    Multiple births Maternal Grandmother    Multiple births Paternal Grandmother      SOCIAL HISTORY:  reports that she has never smoked. She has never used smokeless tobacco. She reports current alcohol use of about 7.0 standard drinks of alcohol per week. She reports that she does not use drugs. The patient is married and lives in [place].  ALLERGIES: Lambs quarters, Entresto [sacubitril-valsartan], Lanolin acid [lanolin], and Other   MEDICATIONS:  Current Outpatient Medications  Medication Sig Dispense Refill   apixaban (ELIQUIS) 5 MG TABS tablet Take 1 tablet (5 mg total) by mouth 2 (two) times daily. 60 tablet 5   atorvastatin (LIPITOR) 40 MG tablet TAKE ONE TABLET BY MOUTH DAILY 90 tablet 3   Biotin 5000 MCG CHEW Chew by mouth.     Biotin w/ Vitamins C & E (HAIR/SKIN/NAILS PO) Take 1 capsule by mouth daily.     dapagliflozin propanediol (FARXIGA) 10 MG TABS tablet Take 1 tablet (10 mg total) by mouth daily before breakfast. 90 tablet 3   ezetimibe (ZETIA) 10 MG tablet Take 1 tablet (10 mg total) by mouth daily. 90 tablet 3   ipratropium (ATROVENT) 0.03 % nasal spray Place 1 spray into both nostrils daily.     levothyroxine (SYNTHROID) 50 MCG tablet TAKE 1 TABLET BY MOUTH DAILY 90 tablet 2   metoprolol succinate (TOPROL-XL) 50 MG 24 hr tablet Take 1 tablet (50 mg total) by mouth daily. Take with or immediately following a meal. 90 tablet 0   nitroGLYCERIN (NITROSTAT) 0.4 MG SL tablet Place 1 tablet (0.4 mg total) under the tongue every 5 (five) minutes as needed for chest pain. (Patient not taking: Reported on 05/28/2023) 25 tablet 3   No current facility-administered medications for this encounter.     REVIEW OF SYSTEMS: On review of systems, the patient reports that she is doing well since her biopsy.  She is motivated for mastectomy.   No breast specific complaints are otherwise noted.     PHYSICAL EXAM:  Wt Readings from Last 3 Encounters:  05/29/23 143 lb 12.8 oz (65.2 kg)  05/28/23 142 lb (64.4 kg)  03/26/23 141 lb (64 kg)   Temp Readings from Last 3 Encounters:  05/29/23 97.8 F (36.6 C) (Oral)  05/28/23 98.1 F (36.7 C) (Oral)  04/18/22 97.8 F (36.6 C) (Temporal)   BP Readings from Last 3 Encounters:  05/29/23 111/64  05/28/23 108/64  03/26/23 102/62   Pulse Readings from Last 3 Encounters:  05/29/23 64  05/28/23 (!) 54  03/26/23 65    In general this is a well appearing caucasian female in no acute distress. She's alert and oriented x4 and appropriate throughout the examination. Cardiopulmonary assessment is negative for acute distress  and she exhibits normal effort. Bilateral breast exam is deferred.    ECOG = 0  0 - Asymptomatic (Fully active, able to carry on all predisease activities without restriction)  1 - Symptomatic but completely ambulatory (Restricted in physically strenuous activity but ambulatory and able to carry out work of a light or sedentary nature. For example, light housework, office work)  2 - Symptomatic, <50% in bed during the day (Ambulatory and capable of all self care but unable to carry out any work activities. Up and about more than 50% of waking hours)  3 - Symptomatic, >50% in bed, but not bedbound (Capable of only limited self-care, confined to bed or chair 50% or more of waking hours)  4 - Bedbound (Completely disabled. Cannot carry on any self-care. Totally confined to bed or chair)  5 - Death   Santiago Glad MM, Creech RH, Tormey DC, et al. [id number]). "Toxicity and response criteria of the [facility]". Am. Evlyn Clines. Oncol. 5 (6): 649-55    LABORATORY DATA:  Lab Results  Component Value Date   WBC 5.2 05/28/2023   HGB 13.2 05/28/2023   HCT 40.2 05/28/2023   MCV 98.8 05/28/2023   PLT 213 05/28/2023   Lab Results  Component Value Date    NA 139 05/28/2023   K 5.5 (H) 05/28/2023   CL 102 05/28/2023   CO2 29 05/28/2023   Lab Results  Component Value Date   ALT 19 05/28/2023   AST 16 05/28/2023   ALKPHOS 56 05/28/2023   BILITOT 1.4 (H) 05/28/2023      RADIOGRAPHY: [place] RT BREAST BX W LOC DEV 1ST LESION IMG BX SPEC US GUIDE Addendum Date: 05/21/2023 ADDENDUM REPORT: 05/21/2023 13:35 ADDENDUM: Pathology revealed GRADE II INVASIVE DUCTAL CARCINOMA, DUCTAL CARCINOMA IN SITU, SOLID TYPE, INTERMEDIATE GRADE, CALCIFICATIONS: IDENTIFIED of the RIGHT breast, 6 o'clock, 2cmfn, (ribbon clip). This was found to be concordant by Dr. Emmaline Kluver. Pathology results were discussed with the patient by telephone. The patient reported doing well after the biopsy with tenderness at the site. Post biopsy instructions and care were reviewed and questions were answered. The patient was encouraged to call [facility] for any additional concerns. My direct phone number was provided. Surgical consultation has been arranged with Dr. Harriette Bouillon at [facility] on May 26, 2023. Medical oncology consultation has been arranged with Dr. Arlan Organ, per patient request, at [facility] location, on May 28, 2023. Pathology results reported by Rene Kocher, RN on 05/20/2023. Electronically Signed   By: Emmaline Kluver M.D.   On: 05/21/2023 13:35   Result Date: 05/21/2023 CLINICAL DATA:  72 year old female presenting for biopsy of a mass with calcifications in the right breast. Patient has a history of remote right breast cancer. EXAM: ULTRASOUND GUIDED RIGHT BREAST CORE NEEDLE BIOPSY COMPARISON:  Previous exam(s). PROCEDURE: I met with the patient and we discussed the procedure of ultrasound-guided biopsy, including benefits and alternatives. We discussed the high likelihood of a successful procedure. We discussed the risks of the procedure, including infection, bleeding,  tissue injury, clip migration, and inadequate sampling. Informed written consent was given. The usual time-out protocol was performed immediately prior to the procedure. Lesion quadrant: Lower outer quadrant Using sterile technique and 1% Lidocaine as local anesthetic, under direct ultrasound visualization, a 14 gauge spring-loaded device was used to perform biopsy of a mass with calcifications in the right breast 6 o'clock using a medial approach.  At the conclusion of the procedure a ribbon shaped tissue marker clip was deployed into the biopsy cavity. Follow up 2 view mammogram was performed and dictated separately. IMPRESSION: Ultrasound guided biopsy of a mass with calcifications in the right breast at 6 o'clock. No apparent complications. Electronically Signed: By: Emmaline Kluver M.D. On: 05/19/2023 08:24   MM CLIP PLACEMENT RIGHT Result Date: 05/19/2023 CLINICAL DATA:  Post procedure mammogram for clip placement EXAM: 3D DIAGNOSTIC RIGHT MAMMOGRAM POST ULTRASOUND BIOPSY COMPARISON:  Previous exam(s). FINDINGS: 3D Mammographic images were obtained following ultrasound guided biopsy of a mass with calcifications in the right breast at 6 o'clock. The ribbon biopsy marking clip is in expected position at the site of biopsy. IMPRESSION: Appropriate positioning of the ribbon shaped biopsy marking clip at the site of biopsy in the right breast. Final Assessment: Post Procedure Mammograms for Marker Placement Electronically Signed   By: Emmaline Kluver M.D.   On: 05/19/2023 08:23   MM Digital Diagnostic Unilat R Result Date: 05/15/2023 CLINICAL DATA:  72 year old female presenting as a recall from screening for possible right breast calcifications. Patient has a history of remote right breast cancer status post lumpectomy. EXAM: DIGITAL DIAGNOSTIC UNILATERAL RIGHT MAMMOGRAM; ULTRASOUND RIGHT BREAST LIMITED TECHNIQUE: Right digital diagnostic mammography was performed. ; Targeted ultrasound examination of  the right breast was performed COMPARISON:  Previous exam(s). ACR Breast Density Category b: There are scattered areas of fibroglandular density. FINDINGS: Mammogram: Right breast: Spot 2D magnification views of the right breast were performed as well as full true lateral 2D views. There are grouped irregular linear calcifications in the lower central right breast with what appears to be a mass measuring approximately 1.1 cm. Ultrasound: Targeted ultrasound performed in the right breast at 6 o'clock 2 cm from the nipple demonstrating an irregular hypoechoic mass with associated calcification overall measuring 1.3 x 0.7 x 1.2 cm. This corresponds to the mammographic finding. Targeted ultrasound of the right axilla demonstrates normal lymph nodes. IMPRESSION: Suspicious mass with associated calcifications in the right breast at 6 o'clock measuring 1.3 cm. RECOMMENDATION: Ultrasound-guided core needle biopsy x1 of the right breast. I have discussed the findings and recommendations with the patient. If applicable, a reminder letter will be sent to the patient regarding the next appointment. BI-RADS CATEGORY  4: Suspicious. Electronically Signed   By: Emmaline Kluver M.D.   On: 05/15/2023 11:26   [place] LIMITED ULTRASOUND INCLUDING AXILLA RIGHT BREAST Result Date: 05/15/2023 CLINICAL DATA:  72 year old female presenting as a recall from screening for possible right breast calcifications. Patient has a history of remote right breast cancer status post lumpectomy. EXAM: DIGITAL DIAGNOSTIC UNILATERAL RIGHT MAMMOGRAM; ULTRASOUND RIGHT BREAST LIMITED TECHNIQUE: Right digital diagnostic mammography was performed. ; Targeted ultrasound examination of the right breast was performed COMPARISON:  Previous exam(s). ACR Breast Density Category b: There are scattered areas of fibroglandular density. FINDINGS: Mammogram: Right breast: Spot 2D magnification views of the right breast were performed as well as full true lateral 2D  views. There are grouped irregular linear calcifications in the lower central right breast with what appears to be a mass measuring approximately 1.1 cm. Ultrasound: Targeted ultrasound performed in the right breast at 6 o'clock 2 cm from the nipple demonstrating an irregular hypoechoic mass with associated calcification overall measuring 1.3 x 0.7 x 1.2 cm. This corresponds to the mammographic finding. Targeted ultrasound of the right axilla demonstrates normal lymph nodes. IMPRESSION: Suspicious mass with associated calcifications in the right breast at 6 o'clock measuring  1.3 cm. RECOMMENDATION: Ultrasound-guided core needle biopsy x1 of the right breast. I have discussed the findings and recommendations with the patient. If applicable, a reminder letter will be sent to the patient regarding the next appointment. BI-RADS CATEGORY  4: Suspicious. Electronically Signed   By: Emmaline Kluver M.D.   On: 05/15/2023 11:26   MM 3D SCREENING MAMMOGRAM BILATERAL BREAST Result Date: 05/02/2023 CLINICAL DATA:  Screening. EXAM: DIGITAL SCREENING BILATERAL MAMMOGRAM WITH TOMOSYNTHESIS AND CAD TECHNIQUE: Bilateral screening digital craniocaudal and mediolateral oblique mammograms were obtained. Bilateral screening digital breast tomosynthesis was performed. The images were evaluated with computer-aided detection. COMPARISON:  Previous exam(s). ACR Breast Density Category b: There are scattered areas of fibroglandular density. FINDINGS: In the right breast, calcifications warrant further evaluation with magnified views. In the left breast, no findings suspicious for malignancy. IMPRESSION: Further evaluation is suggested for calcifications in the right breast. RECOMMENDATION: Diagnostic mammogram of the right breast. (Code:[id number]) The patient will be contacted regarding the findings, and additional imaging will be scheduled. BI-RADS CATEGORY  0: Incomplete: Need additional imaging evaluation. Electronically Signed   By:  Baird Lyons M.D.   On: 05/02/2023 13:33       IMPRESSION/PLAN: 1. Stage IA, cT1cN0M0, grade 2 ER positive, HER2 amplified invasive ductal carcinoma of the right breast in the setting of remote Stage II, cT2N0M0, triple negative invasive ductal carcinoma of the right breast who received whole breast radiation in 2003.  He discusses her most recent breast diagnosis, and given that the patient is anticipating upcoming mastectomy, would not likely recommend radiation however she is aware that we would look for her final results of pathology prior to making this decision formally, and requests a call to confirm Dr. Joellen Jersey recommendations after surgery.  We did discuss scenarios for which postmastectomy radiation is offered including positive lymph nodes, T3 tumors, or positive margins.  We also discussed if additional recommendations for postmastectomy radiation were made, that this would be a 6-1/2-week course and briefly discussed the risks and benefits of treatment.  Again we will follow-up with her postoperatively and would be happy to see her as needed moving forward.     In a visit lasting 60 minutes, greater than 50% of the time was spent face to face reviewing her case, as well as in preparation of, discussing, and coordinating the patient's care.  The above documentation reflects my direct findings during this shared patient visit. Please see the separate note by Dr. Mitzi Hansen on this date for the remainder of the patient's plan of care.    Osker Mason, [facility]    **Disclaimer: This note was dictated with voice recognition software. Similar sounding words can inadvertently be transcribed and this note may contain transcription errors which may not have been corrected upon publication of note.**

## 2023-05-30 ENCOUNTER — Ambulatory Visit (HOSPITAL_COMMUNITY): Payer: Medicare HMO | Attending: Hematology & Oncology

## 2023-05-30 ENCOUNTER — Encounter: Payer: Self-pay | Admitting: *Deleted

## 2023-05-30 DIAGNOSIS — I4821 Permanent atrial fibrillation: Secondary | ICD-10-CM | POA: Insufficient documentation

## 2023-05-30 DIAGNOSIS — I214 Non-ST elevation (NSTEMI) myocardial infarction: Secondary | ICD-10-CM | POA: Diagnosis not present

## 2023-05-30 LAB — ECHOCARDIOGRAM COMPLETE
Area-P 1/2: 4.21 cm2
MV M vel: 4.97 m/s
MV Peak grad: 98.8 mm[Hg]
S' Lateral: 2.9 cm

## 2023-06-02 NOTE — Telephone Encounter (Signed)
Patient with diagnosis of atrial fibrillation on Eliquis for anticoagulation.    Procedure: mastectomy or consider lumpectomy with consideration of sentinel lymph node mapping in both situations.  Date of procedure: 07/04/23   CHA2DS2-VASc Score = 6   This indicates a 9.7% annual risk of stroke. The patient's score is based upon: CHF History: 1 HTN History: 0 Diabetes History: 0 Stroke History: 2 Vascular Disease History: 1 Age Score: 1 Gender Score: 1   Embolic stroke 2016   CrCl 59 Platelet count 213  Per office protocol, patient can hold Eliquis for 2 days prior to procedure.   Patient will not need bridging with Lovenox (enoxaparin) around procedure.  **This guidance is not considered finalized until pre-operative APP has relayed final recommendations.**

## 2023-06-02 NOTE — Telephone Encounter (Signed)
   Name: Rhonda Paul  DOB: [birth year]  MRN: [id number]  Primary Cardiologist: Jodelle Red, MD   Preoperative team, please contact this patient and set up a phone call appointment for further preoperative risk assessment. Please obtain consent and complete medication review. Thank you for your help.  I confirm that guidance regarding antiplatelet and oral anticoagulation therapy has been completed and, if necessary, noted below.  Per office protocol, patient can hold Eliquis for 2 days prior to procedure.   Patient will not need bridging with Lovenox (enoxaparin) around procedure.  I also confirmed the patient resides in the state of [place]. As per [facility] Medical Board telemedicine laws, the patient must reside in the state in which the provider is licensed.   Denyce Robert, NP 06/02/2023, 1:38 PM [facility]

## 2023-06-02 NOTE — Telephone Encounter (Signed)
Left message on machine for pt to contact the office.   

## 2023-06-03 ENCOUNTER — Encounter: Payer: Self-pay | Admitting: *Deleted

## 2023-06-03 ENCOUNTER — Telehealth: Payer: Self-pay

## 2023-06-03 NOTE — Telephone Encounter (Signed)
Patient was returning call. Please advise ?

## 2023-06-03 NOTE — Telephone Encounter (Signed)
Patient states that procedure is scheduled for 07/04/23. Patient scheduled for tele visit on 06/19/23. Med rec and consent done

## 2023-06-03 NOTE — Telephone Encounter (Signed)
  Patient Consent for Virtual Visit         Rhonda Paul has provided verbal consent on 06/03/2023 for a virtual visit (video or telephone).   CONSENT FOR VIRTUAL VISIT FOR:  Rhonda Paul  By participating in this virtual visit I agree to the following:  I hereby voluntarily request, consent and authorize [facility] and its employed or contracted physicians, physician assistants, nurse practitioners or other licensed health care professionals (the Practitioner), to provide me with telemedicine health care services (the "Services") as deemed necessary by the treating Practitioner. I acknowledge and consent to receive the Services by the Practitioner via telemedicine. I understand that the telemedicine visit will involve communicating with the Practitioner through live audiovisual communication technology and the disclosure of certain medical information by electronic transmission. I acknowledge that I have been given the opportunity to request an in-person assessment or other available alternative prior to the telemedicine visit and am voluntarily participating in the telemedicine visit.  I understand that I have the right to withhold or withdraw my consent to the use of telemedicine in the course of my care at any time, without affecting my right to future care or treatment, and that the Practitioner or I may terminate the telemedicine visit at any time. I understand that I have the right to inspect all information obtained and/or recorded in the course of the telemedicine visit and may receive copies of available information for a reasonable fee.  I understand that some of the potential risks of receiving the Services via telemedicine include:  Delay or interruption in medical evaluation due to technological equipment failure or disruption; Information transmitted may not be sufficient (e.g. poor resolution of images) to allow for appropriate medical decision making by the  Practitioner; and/or  In rare instances, security protocols could fail, causing a breach of personal health information.  Furthermore, I acknowledge that it is my responsibility to provide information about my medical history, conditions and care that is complete and accurate to the best of my ability. I acknowledge that Practitioner's advice, recommendations, and/or decision may be based on factors not within their control, such as incomplete or inaccurate data provided by me or distortions of diagnostic images or specimens that may result from electronic transmissions. I understand that the practice of medicine is not an exact science and that Practitioner makes no warranties or guarantees regarding treatment outcomes. I acknowledge that a copy of this consent can be made available to me via my patient portal [facility] MyChart), or I can request a printed copy by calling the office of [facility].    I understand that my insurance will be billed for this visit.   I have read or had this consent read to me. I understand the contents of this consent, which adequately explains the benefits and risks of the Services being provided via telemedicine.  I have been provided ample opportunity to ask questions regarding this consent and the Services and have had my questions answered to my satisfaction. I give my informed consent for the services to be provided through the use of telemedicine in my medical care

## 2023-06-03 NOTE — Progress Notes (Signed)
Surgery scheduled for 07/03/2022. Her Echo was completed last week.   Oncology Nurse Navigator Documentation     06/03/2023   10:30 AM  Oncology Nurse Navigator Flowsheets  Navigator Follow Up Date: 07/04/2023  Navigator Follow Up Reason: Surgery  Navigator Location [facility]  Navigator Encounter Type Appt/Treatment Plan Review  Patient Visit Type MedOnc  Treatment Phase Pre-Tx/Tx Discussion  Barriers/Navigation Needs Coordination of Care;Education  Interventions None Required  Acuity Level 2-Minimal Needs (1-2 Barriers Identified)  Support Groups/Services Friends and Family  Time Spent with Patient 15

## 2023-06-05 ENCOUNTER — Telehealth: Payer: Self-pay

## 2023-06-05 NOTE — Telephone Encounter (Signed)
Clinical Social Work was referred by medical provider for assessment of psychosocial needs.  CSW attempted to contact patient by phone.  Left voicemail with contact information and request for return call.

## 2023-06-09 ENCOUNTER — Telehealth: Payer: Self-pay | Admitting: Hematology & Oncology

## 2023-06-09 DIAGNOSIS — Z0100 Encounter for examination of eyes and vision without abnormal findings: Secondary | ICD-10-CM | POA: Diagnosis not present

## 2023-06-09 NOTE — Telephone Encounter (Signed)
Spoke to patient to get her scheduled per scheduling message. Patient unable to talk will call back at a later date.

## 2023-06-10 ENCOUNTER — Telehealth: Payer: Self-pay

## 2023-06-10 NOTE — Telephone Encounter (Signed)
 Clinical Social Work was referred by medical provider for assessment of psychosocial needs.  CSW attempted to contact patient by phone a second time.  Left voicemail with contact information and request for return call.

## 2023-06-12 ENCOUNTER — Telehealth: Payer: Self-pay | Admitting: Genetic Counselor

## 2023-06-12 ENCOUNTER — Telehealth: Payer: Self-pay | Admitting: Dietician

## 2023-06-12 ENCOUNTER — Inpatient Hospital Stay: Payer: Medicare HMO | Attending: Hematology & Oncology | Admitting: Dietician

## 2023-06-12 NOTE — Progress Notes (Signed)
 Nutrition Assessment: Patient returned call from mobile number.    Reason for Assessment: New Patient Assessment   ASSESSMENT: Patient is a 73 year old post menopausal white female who had a  triple negative breast cancer of the right breast 20 years ago.   Now, she appears to have a new breast cancer with plans for mastectomy.  She met with radiation which is not recommended since she is having surgery.   Patient feels well, weight is table, eat a healthy diet.  She had not questions or concerns at this time.  Allergic to lamb.   Usual intake:  Two meals a day with grazing in between.  Breakfast: Oatmeal with Almond milk with blueberries or bananas Grazes on yogurt, cheese, mixed nuts, hard boiled eggs Dinner: Protein fish, chicken pork or tofu with a vegetable generally follows low carb diet Fluids: Coffee 3 cups with milk, Club soda w/lemon 24oz, water 32oz, Ensure HP in home if they need to use as meal replacement   Nutrition Focused Physical Exam: unable to perform NFPE   Medications: biotin, Vit C, and Vit D   Labs: 05/28/23  K+5.5    Anthropometrics: Weight loss early last year r/t fluid losses with medication changes, now stable.   Height: 65 Weight: 143.7# UBW: 141-143 lately BMI: 23.93   NUTRITION DIAGNOSIS: Food and Nutrition Related Knowledge Deficit related to cancer and associated treatments as evidenced by no prior need for nutrition related information.   INTERVENTION:   Relayed that nutrition services are wrap around service provided at no charge and encouraged continued communication if experiencing any nutritional impact symptoms (NIS). Educated on importance of adequate nourishment with calorie and protein energy intake with nutrient dense foods when possible to maintain weight/strength and QOL.   Relayed benefits of Juven or Arginaid post surgery to help with healing. Contact information provided.  MONITORING, EVALUATION, GOAL: weight trends, nutrition  impact symptoms, PO intake, labs   Next Visit: PRN at patient or provider request.  Micheline Craven, RDN, LDN Registered Dietitian, [facility] Part Time Remote (Usual office hours: Tuesday-Thursday) Mobile: [phone]

## 2023-06-12 NOTE — Telephone Encounter (Signed)
 Attempted to reach patient for a scheduled remote nutrition consult. Provided my cell# on voice mail to return call for her nutrition consult.  Gennaro Africa, RDN, LDN Registered Dietitian, [facility] Part Time Remote (Usual office hours: Tuesday-Thursday) Cell: [phone]

## 2023-06-12 NOTE — Telephone Encounter (Signed)
 Patient called to setup genetic counseling appt.  Scheduled 3/3 at 9am at [facility].

## 2023-06-13 ENCOUNTER — Ambulatory Visit: Payer: Self-pay | Admitting: Surgery

## 2023-06-13 DIAGNOSIS — C50911 Malignant neoplasm of unspecified site of right female breast: Secondary | ICD-10-CM | POA: Diagnosis not present

## 2023-06-16 NOTE — H&P (Signed)
 History of Present Illness: Rhonda Paul is a 72 y.o. female who is seen today as an office consultation for evaluation of new breast cancer (Right breast ca)  Patient presents for evaluation of newly diagnosed right breast cancer upper inner quadrant. Core biopsy showed grade 2 invasive ductal carcinoma measuring 1.3 cm with DCIS ER positive, PR negative, HER2/neu positive with a KI of 15%. Patient has a history of right breast cancer treated 20 years ago with breast conserving surgery, radiation therapy and chemotherapy she states. No complaints of breast pain, breast mass or nipple discharge.  Review of Systems: A complete review of systems was obtained from the patient. I have reviewed this information and discussed as appropriate with the patient. See HPI as well for other ROS.    Medical History: Past Medical History:  Diagnosis Date  Arrhythmia  History of cancer  History of stroke  Sleep apnea   There is no problem list on file for this patient.  Past Surgical History:  Procedure Laterality Date  APPENDECTOMY  lumpe    No Known Allergies  Current Outpatient Medications on File Prior to Visit  Medication Sig Dispense Refill  apixaban  (ELIQUIS ) 5 mg tablet Take 5 mg by mouth every 12 (twelve) hours  atorvastatin  (LIPITOR ) 40 MG tablet Take 40 mg by mouth once daily  biotin 5,000 mcg Chew Take by mouth  ezetimibe  (ZETIA ) 10 mg tablet Take 10 mg by mouth once daily  FARXIGA  5 mg tablet Take 5 mg by mouth once daily  levothyroxine  (SYNTHROID ) 25 MCG tablet Take 25 mcg by mouth once daily Take on an empty stomach with a glass of water at least 30-60 minutes before breakfast.  metoprolol  SUCCinate (TOPROL -XL) 50 MG XL tablet Take 50 mg by mouth once daily   No current facility-administered medications on file prior to visit.   Family History  Problem Relation Age of Onset  Coronary Artery Disease (Blocked arteries around heart) Father    Social History   Tobacco Use   Smoking Status Never  Smokeless Tobacco Never    Social History   Socioeconomic History  Marital status: Married  Tobacco Use  Smoking status: Never  Smokeless tobacco: Never  Substance and Sexual Activity  Alcohol use: Yes  Drug use: Never   Objective:   Vitals:  05/26/23 1006  BP: 110/72  Pulse: 60  Temp: 36.4 C (97.6 F)  SpO2: 97%  Weight: 64.5 kg (142 lb 3.2 oz)  Height: 165.1 cm (5' 5)  PainSc: 0-No pain   Body mass index is 23.66 kg/m.  Physical Exam Exam conducted with a chaperone present.  HENT:  Head: Normocephalic.  Cardiovascular:  Rate and Rhythm: Rhythm irregular.  Pulmonary:  Effort: Pulmonary effort is normal.  Chest:  Breasts: Right: No mass or tenderness.  Left: Normal.   Musculoskeletal:  Cervical back: Normal range of motion.  Lymphadenopathy:  Upper Body:  Right upper body: No supraclavicular or axillary adenopathy.  Left upper body: No supraclavicular or axillary adenopathy.  Skin: General: Skin is warm.  Neurological:  General: No focal deficit present.  Mental Status: She is alert.  Psychiatric:  Mood and Affect: Mood normal.     Labs, Imaging and Diagnostic Testing:  FINAL DIAGNOSIS  1. Breast, right, needle core biopsy, 6 o'clock, 2cmfn, ribbon clip : INVASIVE DUCTAL CARCINOMA DUCTAL CARCINOMA IN SITU, SOLID TYPE, INTERMEDIATE GRADE TUBULE FORMATION: SCORE 3 NUCLEAR PLEOMORPHISM: SCORE 2 MITOTIC COUNT: SCORE 1 TOTAL SCORE: 6 OVERALL GRADE: 2 LYMPHOVASCULAR INVASION: NOT IDENTIFIED  CANCER LENGTH: 10 MM CALCIFICATIONS: IDENTIFIED OTHER FINDINGS: NONE SEE NOTE  Diagnosis Note : Dr. Belvie reviewed the case and concurs with the interpretation. A breast prognostic profile (ER and PR) is pending and will be reported in an addendum. The breast center of [facility] was notified on 05/20/2023.  DATE SIGNED OUT: 05/20/2023 ELECTRONIC SIGNATURE : Pepper Dutton Md, Pathologist, Electronic  Signature  MICROSCOPIC DESCRIPTION  CASE COMMENTS STAINS USED IN DIAGNOSIS: H&E-2 H&E-3 H&E-4 H&E *RECUT 1 SLIDE Universal Negative Control-DAB Universal Negative Control-DAB Universal Negative Control-DAB Stains used in diagnosis 1 Her2 by IHC, 1 ER-ACIS, 1 KI-67-ACIS, 1 PR-ACIS IHC scores are reported using ASCO/CAP scoring criteria. An IHC Score of 0 or 1+ is NEGATIVE for HER2, 3+ is POSITIVE for HER2, and 2+ is EQUIVOCAL. Equivocal results are reflexed to either FISH or IHC testing. Specimens are fixed in 10% Neutral Buffered Formalin for at least 6 hours and up to 72 hours. These tests have not be validated on decalcified tissue. Results should be interpreted with caution given the possibility of false negative results on decalcified specimens. Antibody Clone for HER2 is 4B5 (PATHWAY). Some of these immunohistochemical stains may have been developed and the performance characteristics determined by [facility]. Some may not have been cleared or approved by the U.S. Food and Drug Administration. The FDA has determined that such clearance or approval is not necessary. This test is used for clinical purposes. It should not be regarded as investigational or for research. This laboratory is certified under the Clinical Laboratory Improvement Amendments of 1988 (CLIA-88) as qualified to perform high complexity clinical laboratory testing. Estrogen receptor (6F11), immunohistochemical stains are performed on formalin fixed, paraffin embedded tissue using a 3,3-diaminobenzidine (DAB) chromogen and Leica Bond Autostainer System. The staining intensity of the nucleus is scored manually and is reported as the percentage of tumor cell nuclei demonstrating specific nuclear staining.Specimens are fixed in 10% Neutral Buffered Formalin for at least 6 hours and up to 72 hours. These tests have not be validated on decalcified tissue. Results should be interpreted with  caution given the possibility of false negative results on decalcified specimens. Ki-67 (MM1), immunohistochemical stains are performed on formalin fixed, paraffin embedded tissue using a 3,3-diaminobenzidine (DAB) chromogen and Leica Bond Autostainer System. The staining intensity of the nucleus is scored manually and is reported as the percentage of tumor cell nuclei demonstrating specific nuclear staining.Specimens are fixed in 10% Neutral Buffered Formalin for at least 6 hours and up to 72 hours. These tests have not be validated on decalcified tissue. Results should be interpreted with caution given the possibility of false negative results on decalcified specimens. PR progesterone receptor (16), immunohistochemical stains are performed on formalin fixed, paraffin embedded tissue using a 3,3-diaminobenzidine (DAB) chromogen and Leica Bond Autostainer System. The staining intensity of the nucleus is scored manually and is reported as the percentage of tumor cell nuclei demonstrating specific nuclear staining.Specimens are fixed in 10% Neutral Buffered Formalin for at least 6 hours and up to 72 hours. These tests have not be validated on decalcified tissue. Results should be interpreted with caution given the possibility of false negative results on decalcified specimens.  ADDENDUM Breast, right, needle core biopsy, 6 o'clock, 2cmfn, ribbon clip PROGNOSTIC INDICATORS  Results: IMMUNOHISTOCHEMICAL AND MORPHOMETRIC ANALYSIS PERFORMED MANUALLY The tumor cells are positive for Her2 (3+). Estrogen Receptor: 100%, POSITIVE, STRONG STAINING INTENSITY Progesterone Receptor: 0%, NEGATIVE Proliferation Marker Ki67: 15% COMMENT: The negative hormone receptor study(ies) in this case has an internal  positive control.  REFERENCE RANGE ESTROGEN RECEPTOR NEGATIVE 0% POSITIVE =>1% REFERENCE RANGE PROGESTERONE RECEPTOR NEGATIVE 0% POSITIVE =>1% All controls stained appropriately Belvie Come,  John, Pathologist, Electronic Signature ( Signed [phone])  CLINICAL HISTORY  SPECIMEN(S) OBTAINED 1. Breast, right, needle core biopsy, 6 O'clock, 2cmfn, Ribbon Clip  SPECIMEN COMMENTS: 1. TIF: 8:15 AM, CIT < 1 min; remote hx right breast ca; mass with calcifications SPECIMEN CLINICAL INFORMATION: 1. Suspicious for CSL or malignancy  Gross Description 1. Rt BR 6 o'clock, 2 cmfn, mass w/ calcs, received in formalin are five 0.5-1.5 cm long fibroadipose tissue cores with a 0.1-0.2 cm diameter. The specimen is submitted in toto in 1 block (1A). Time in formalin: 05/19/23 at 0815 Cold ischemia time: Less than 1 minute Localization: Ribbon clip AMG 05/19/2023  CLINICAL DATA: 73 year old female presenting as a recall from screening for possible right breast calcifications. Patient has a history of remote right breast cancer status post lumpectomy.  EXAM: DIGITAL DIAGNOSTIC UNILATERAL RIGHT MAMMOGRAM; ULTRASOUND RIGHT BREAST LIMITED  TECHNIQUE: Right digital diagnostic mammography was performed. ; Targeted ultrasound examination of the right breast was performed  COMPARISON: Previous exam(s).  ACR Breast Density Category b: There are scattered areas of fibroglandular density.  FINDINGS: Mammogram:  Right breast: Spot 2D magnification views of the right breast were performed as well as full true lateral 2D views. There are grouped irregular linear calcifications in the lower central right breast with what appears to be a mass measuring approximately 1.1 cm.  Ultrasound:  Targeted ultrasound performed in the right breast at 6 o'clock 2 cm from the nipple demonstrating an irregular hypoechoic mass with associated calcification overall measuring 1.3 x 0.7 x 1.2 cm. This corresponds to the mammographic finding. Targeted ultrasound of the right axilla demonstrates normal lymph nodes.  IMPRESSION: Suspicious mass with associated calcifications in the right breast at 6  o'clock measuring 1.3 cm.  RECOMMENDATION: Ultrasound-guided core needle biopsy x1 of the right breast.  I have discussed the findings and recommendations with the patient. If applicable, a reminder letter will be sent to the patient regarding the next appointment.  BI-RADS CATEGORY 4: Suspicious.   Electronically Signed By: Inocente Ast M.D. On: 05/15/2023 11:26  Assessment and Plan:   Diagnoses and all orders for this visit:  Breast cancer, stage 1, right (CMS/HHS-HCC)   Refer to radiation oncology. The patient does have desire for consideration of breast conservation surgery. I explained given her previous radiation therapy that mastectomy is standard of care at this point but there is data to support repeat lumpectomy especially women over the age of 105 with favorable characteristics. She understands this and is open to mastectomy but like to explore options prior to committing to that path. We discussed her ultrasound that did not show any lymph node abnormality. I did discuss with her the possibility for sentinel lymph node mapping given her HER2/neu positive state. She sees oncology this week. When she has seen the consultants, we can put a plan together either to proceed with mastectomy or consider lumpectomy with consideration of sentinel lymph node mapping in both situations.

## 2023-06-18 ENCOUNTER — Other Ambulatory Visit: Payer: Self-pay | Admitting: Cardiology

## 2023-06-19 ENCOUNTER — Ambulatory Visit: Payer: Medicare HMO | Attending: Cardiology | Admitting: Student

## 2023-06-19 DIAGNOSIS — Z01810 Encounter for preprocedural cardiovascular examination: Secondary | ICD-10-CM

## 2023-06-19 NOTE — Progress Notes (Signed)
 Virtual Visit via Telephone Note   Because of AYAME RENA co-morbid illnesses, she is at least at moderate risk for complications without adequate follow up.  This format is felt to be most appropriate for this patient at this time.  The patient did not have access to video technology/had technical difficulties with video requiring transitioning to audio format only (telephone).  All issues noted in this document were discussed and addressed.  No physical exam could be performed with this format.  Please refer to the patient's chart for her consent to telehealth for [facility].  Evaluation Performed:  Preoperative cardiovascular risk assessment _____________   Date:  06/19/2023   Patient ID:  Rhonda Paul, DOB [birth year], MRN [id number] Patient Location:  Home Provider location:   Office  Primary Care Provider:  Cleotilde Planas, MD Primary Cardiologist:  Shelda Bruckner, MD  Chief Complaint / Patient Profile   73 y.o. y/o female with a h/o CAD, permanent A-fib on anticoagulation, chronic HFmrEF with recovered LV function/nonischemic cardiomyopathy, hyperlipidemia, CVA, breast cancer who is pending right simple mastectomy with sentinel node mapping by Dr. Vanderbilt on 07/04/2023 and presents today for telephonic preoperative cardiovascular risk assessment.  History of Present Illness    Rhonda Paul is a 73 y.o. female who presents via audio/video conferencing for a telehealth visit today.  Pt was last seen in cardiology clinic on 03/26/2023 by Dr. Bruckner.  At that time Rhonda Paul was stable from a cardiac standpoint.  The patient is now pending procedure as outlined above. Since her last visit, she is doing well. Patient denies shortness of breath, dyspnea on exertion, lower extremity edema, orthopnea or PND. No chest pain, pressure, or tightness. No palpitations.  She is active participating in yoga twice a week and walking for 20-30 minutes the other  days of the week.   Past Medical History    Past Medical History:  Diagnosis Date   Adenocarcinoma of breast (HCC) 06/03   RIGHT   Arthritis    Chronic anticoagulation    Eliquis    Diverticulitis 2009   diverticulitis   Heart attack (HCC)    HOH (hard of hearing)    right ear; wears hearing aid   Hypertension    Infertility, female 09/01   4 SAB   Metrorrhagia    Migraine 09/01   Permanent atrial fibrillation (HCC) 04/01/2014   Personal history of chemotherapy 2003   Personal history of radiation therapy 2003   STD (sexually transmitted disease) 1976   Hx of HSV   Stroke (HCC) 01/03/15   s/p TPA R-MCA   Vitamin D  deficiency 07/2006   Past Surgical History:  Procedure Laterality Date   APPENDECTOMY  1959   BREAST BIOPSY Right 05/19/2023   US  RT BREAST BX W LOC DEV 1ST LESION IMG BX SPEC US  GUIDE 05/19/2023 [facility] M[facility]MO[facility]   BREAST LUMPECTOMY Right    BREAST SURGERY Right 11/2001   lumpectomy with sentinel node biopsy X 3 all negative, ER/PR negative , chemo 6 doses, and radiation daily X 6 weeks   BUBBLE STUDY  04/18/2022   Procedure: BUBBLE STUDY;  Surgeon: Santo Stanly LABOR, MD;  Location: [facility] ENDOSCOPY;  Service: Cardiovascular;;   CARDIAC CATHETERIZATION N/A 01/16/2015   Procedure: Left Heart Cath and Coronary Angiography;  Surgeon: Victory LELON Sharps, MD; OM1 75%, RCA 30%, EF normal; spasm in RCA w/ catheter engagement    COLONOSCOPY  11/2012   diverticulitis, 2 polyps negative, repeat in 5 years  HALLUX FUSION Left 03/03/2017   Procedure: HALLUX RIGIDUS CORRECTION WITH COLECTOMY DEBRIDEMENT AND CAPSULAR RELEASE OF THE FIRST MPJ WITH IMPLANT LEFT FOOT;  Surgeon: Zan Factor, DPM;  Location: [facility];  Service: Podiatry;  Laterality: Left;   JOINT REPLACEMENT Left 02/2017   big toe   PORT-A-CATH REMOVAL     RADIOLOGY WITH ANESTHESIA N/A 01/03/2015   Procedure: RADIOLOGY WITH ANESTHESIA;  Surgeon: Medication Radiologist, MD;  Location: [facility] OR;   Service: Radiology;  Laterality: N/A;   TEE WITHOUT CARDIOVERSION N/A 04/18/2022   Procedure: TRANSESOPHAGEAL ECHOCARDIOGRAM (TEE);  Surgeon: Santo Stanly LABOR, MD;  [facility] END[facility];  Service: Cardiovascular;  Laterality: N/A;    Allergies  Allergies  Allergen Reactions   Lamb[place] Hives, Itching and Nausea And Vomiting   Entresto  [Sacubitril -Valsartan ]     Low BP   Lanolin Acid [Lanolin] Rash   Other Itching and Rash    wool    Home Medications    Prior to Admission medications   Medication Sig Start Date End Date Taking? Authorizing Provider  apixaban  (ELIQUIS ) 5 MG TABS tablet Take 1 tablet (5 mg total) by mouth 2 (two) times daily. 12/23/22   Hobart Powell BRAVO, MD  atorvastatin  (LIPITOR ) 40 MG tablet TAKE ONE TABLET BY MOUTH DAILY 04/30/23   Lonni Slain, MD  Biotin 5000 MCG CHEW Chew by mouth.    [provider]  Biotin w/ Vitamins C & E (HAIR/SKIN/NAILS PO) Take 1 capsule by mouth daily.    [provider]  dapagliflozin  propanediol (FARXIGA ) 10 MG TABS tablet Take 1 tablet (10 mg total) by mouth daily before breakfast. 08/21/22   Hobart Powell BRAVO, MD  ezetimibe  (ZETIA ) 10 MG tablet Take 1 tablet (10 mg total) by mouth daily. 03/31/23   Lonni Slain, MD  ipratropium (ATROVENT ) 0.03 % nasal spray Place 1 spray into both nostrils daily. 11/11/19   [provider]  levothyroxine  (SYNTHROID ) 50 MCG tablet TAKE 1 TABLET BY MOUTH DAILY 05/21/23   Cleotilde Ronal RAMAN, MD  metoprolol  succinate (TOPROL -XL) 50 MG 24 hr tablet TAKE 1 TABLET BY MOUTH DAILY WITH OR IMMEDIATELY FOLLOWING A MEAL 06/18/23   Lonni Slain, MD  nitroGLYCERIN  (NITROSTAT ) 0.4 MG SL tablet Place 1 tablet (0.4 mg total) under the tongue every 5 (five) minutes as needed for chest pain. 03/25/22   Claudene Victory ORN, MD    Physical Exam    Vital Signs:  Rhonda Paul does not have vital signs available for review today.  Given telephonic nature of  communication, physical exam is limited. AAOx3. NAD. Normal affect.  Speech and respirations are unlabored.  Assessment & Plan    Primary Cardiologist: Slain Lonni, MD  Preoperative cardiovascular risk assessment.  Right simple mastectomy with sentinel node mapping by Dr. Vanderbilt on 07/04/2023.  Chart reviewed as part of pre-operative protocol coverage. According to the RCRI, patient has a 6.6% risk of MACE. Patient reports activity equivalent to >4.0 METS (performs yoga twice a week and walks 20-30 minutes on the other days of the week).   Given past medical history and time since last visit, based on ACC/AHA guidelines, Rhonda Paul would be at acceptable risk for the planned procedure without further cardiovascular testing.   Patient was advised that if she develops new symptoms prior to surgery to contact our office to arrange a follow-up appointment.  she verbalized understanding.  Per Pharm D, patient may hold Eliquis  for 2 days prior to procedure.  Patient will not  need bridging with Lovenox  (enoxaparin ) around procedure.  I will route this recommendation to the requesting party via Epic fax function.  Please call with questions.  Time:   Today, I have spent 6 minutes with the patient with telehealth technology discussing medical history, symptoms, and management plan.     Barnie Hila, NP  06/19/2023, 7:51 AM

## 2023-06-22 NOTE — Therapy (Signed)
 OUTPATIENT PHYSICAL THERAPY BREAST CANCER BASELINE EVALUATION   Patient Name: Rhonda Paul MRN: [id number] DOB:01/01/1951, 73 y.o., female Today's Date: 06/23/2023  END OF SESSION:  PT End of Session - 06/23/23 1146     Visit Number 1    Number of Visits 2    Date for PT Re-Evaluation 08/06/23    PT Start Time 1100    PT Stop Time 1142    PT Time Calculation (min) 42 min    Activity Tolerance Patient tolerated treatment well    Behavior During Therapy [facility] for tasks assessed/performed             Past Medical History:  Diagnosis Date   Adenocarcinoma of breast (HCC) 06/03   RIGHT   Arthritis    Chronic anticoagulation    Eliquis    Diverticulitis 2009   diverticulitis   Heart attack (HCC)    HOH (hard of hearing)    right ear; wears hearing aid   Hypertension    Infertility, female 09/01   4 SAB   Metrorrhagia    Migraine 09/01   Permanent atrial fibrillation (HCC) 04/01/2014   Personal history of chemotherapy 2003   Personal history of radiation therapy 2003   STD (sexually transmitted disease) 1976   Hx of HSV   Stroke (HCC) 01/03/15   s/p TPA R-MCA   Vitamin D  deficiency 07/2006   Past Surgical History:  Procedure Laterality Date   APPENDECTOMY  1959   BREAST BIOPSY Right 05/19/2023   US  RT BREAST BX W LOC DEV 1ST LESION IMG BX SPEC US  GUIDE 05/19/2023 [facility] MAMMOGRAP[facility]   BREAST LUMPECTOMY Right    BREAST SURGERY Right 11/2001   lumpectomy with sentinel node biopsy X 3 all negative, ER/PR negative , chemo 6 doses, and radiation daily X 6 weeks   BUBBLE STUDY  04/18/2022   Procedure: BUBBLE STUDY;  Surgeon: Santo Stanly LABOR, MD;  Location: [facility];  Service: Cardiovascular;;   CARDIAC CATHETERIZATION N/A 01/16/2015   Procedure: Left Heart Cath and Coronary Angiography;  Surgeon: Victory LELON Sharps, MD; OM1 75%, RCA 30%, EF normal; spasm in RCA w/ catheter engagement    COLONOSCOPY  11/2012   diverticulitis, 2 polyps negative, repeat in 5 years    HALLUX FUSION Left 03/03/2017   Procedure: HALLUX RIGIDUS CORRECTION WITH COLECTOMY DEBRIDEMENT AND CAPSULAR RELEASE OF THE FIRST MPJ WITH IMPLANT LEFT FOOT;  Surgeon: Zan Factor, DPM;  Location: [facility];  Service: Podiatry;  Laterality: Left;   JOINT REPLACEMENT Left 02/2017   big toe   PORT-A-CATH REMOVAL     RADIOLOGY WITH ANESTHESIA N/A 01/03/2015   Procedure: RADIOLOGY WITH ANESTHESIA;  Surgeon: Medication Radiologist, MD;  Location: [facility] OR;  Service: Radiology;  Laterality: N/A;   TEE WITHOUT CARDIOVERSION N/A 04/18/2022   Procedure: TRANSESOPHAGEAL ECHOCARDIOGRAM (TEE);  Surgeon: Santo Stanly LABOR, MD;  Location: [facility] ENDOSCOPY;  Service: Cardiovascular;  Laterality: N/A;   Patient Active Problem List   Diagnosis Date Noted   CAD in native artery 07/28/2016   Chronic anticoagulation     NSTEMI- med rx, culprit lesion 75% OM1 01/14/2015   Atelectasis    Acute respiratory failure with hypoxemia (HCC)    Cerebral infarction (HCC) 01/03/2015   Permanent atrial fibrillation (HCC) 04/01/2014   Moderate mitral regurgitation 04/01/2014   Breast cancer (HCC) 04/01/2014   Hyperlipidemia 04/01/2014    PCP: Olam Pinal, MD  REFERRING PROVIDER: Debby Shipper, MD  REFERRING DIAG: Rt breast cancer  THERAPY DIAG:  Malignant neoplasm of nipple of right breast in female, unspecified estrogen receptor status (HCC)  Abnormal posture  At risk for lymphedema  Rationale for Evaluation and Treatment: Rehabilitation  ONSET DATE: 05/20/23  SUBJECTIVE:                                                                                                                                                                                           SUBJECTIVE STATEMENT: Patient reports she is here today to be seen by her medical team for her newly diagnosed right breast cancer. No shoulder issues.    PERTINENT HISTORY:  Patient was diagnosed with right grade 2 IDC. It measures  1.3 cm and is located in the upper inner quadrant. It is ER pos, PR neg, HER2pos with a Ki67 of 15%. Hx of Rt breast cancer 20 years ago with lumpectomy and radiation and chemotherapy. This time pt will have a mastectomy  on 07/04/23 with chemotherapy. Moderate mitral valve regurgitation and Afib   PATIENT GOALS:   reduce lymphedema risk and learn post op HEP.   PAIN:  Are you having pain? No  PRECAUTIONS: Active CA   RED FLAGS: None   HAND DOMINANCE: right  WEIGHT BEARING RESTRICTIONS: No  FALLS:  Has patient fallen in last 6 months? No  LIVING ENVIRONMENT: Patient lives with: husband   OCCUPATION: retired   LEISURE: house cleaning, I always spit wood and get the firewood ready, yoga 2x per week, walking   PRIOR LEVEL OF FUNCTION: Independent   OBJECTIVE: Note: Objective measures were completed at Evaluation unless otherwise noted.  COGNITION: Overall cognitive status: Within functional limits for tasks assessed    POSTURE:  Forward head and rounded shoulders posture  UPPER EXTREMITY AROM/PROM:  A/PROM RIGHT   eval   Shoulder extension 65  Shoulder flexion 150  Shoulder abduction 170  Shoulder internal rotation 70  Shoulder external rotation 90    (Blank rows = not tested)  A/PROM LEFT   eval  Shoulder extension 55  Shoulder flexion 145  Shoulder abduction 165  Shoulder internal rotation 70  Shoulder external rotation 75    (Blank rows = not tested)  CERVICAL AROM: All within normal limits:   UPPER EXTREMITY STRENGTH: 4+/5 bil without pain  30 sit to stand: 13.5 1/10 difficulty   LYMPHEDEMA ASSESSMENTS (in cm):   LANDMARK RIGHT   eval  10 cm proximal to olecranon process 25.6  Olecranon process 24.3  10 cm proximal to ulnar styloid process 17.7  Just proximal to ulnar styloid process 15.1  Across hand at thumb web space 19.3  At base of 2nd digit 6.8  (  Blank rows = not tested)  LANDMARK LEFT   eval  10 cm proximal to olecranon process  26  Olecranon process 24.3  10 cm proximal to ulnar styloid process 17.7  Just proximal to ulnar styloid process 15.1  Across hand at thumb web space 19.4  At base of 2nd digit 6.8  (Blank rows = not tested)  L-DEX LYMPHEDEMA SCREENING: The patient was assessed using the L-Dex machine today to produce a lymphedema index baseline score. The patient will be reassessed on a regular basis (typically every 3 months) to obtain new L-Dex scores. If the score is > 6.5 points away from his/her baseline score indicating onset of subclinical lymphedema, it will be recommended to wear a compression garment for 4 weeks, 12 hours per day and then be reassessed. If the score continues to be > 6.5 points from baseline at reassessment, we will initiate lymphedema treatment. Assessing in this manner has a 95% rate of preventing clinically significant lymphedema.   QUICK DASH SURVEY: 2% limited  PATIENT EDUCATION:  Education details: Lymphedema risk reduction and post op shoulder/posture HEP Person educated: Patient Education method: Explanation, Demonstration, Handout Education comprehension: Patient verbalized understanding and returned demonstration  HOME EXERCISE PROGRAM: Patient was instructed today in a home exercise program today for post op shoulder range of motion. These included active assist shoulder flexion in sitting, scapular retraction, wall walking with shoulder abduction, and hands behind head external rotation.  She was encouraged to do these twice a day, holding 3 seconds and repeating 5 times when permitted by her physician.   ASSESSMENT:  CLINICAL IMPRESSION: Pt will benefit from a post op PT reassessment to determine needs and from L-Dex screens every 3 months for 2 years to detect subclinical lymphedema.  Pt will benefit from skilled therapeutic intervention to improve on the following deficits: Decreased knowledge of precautions, impaired UE functional use, pain, decreased ROM,  postural dysfunction.   PT treatment/interventions: ADL/self-care home management, pt/family education, therapeutic exercise  REHAB POTENTIAL: Excellent  CLINICAL DECISION MAKING: Stable/uncomplicated  EVALUATION COMPLEXITY: Low   GOALS: Goals reviewed with patient? YES  LONG TERM GOALS: (STG=LTG)    Name Target Date Goal status  1 Pt will be able to verbalize understanding of pertinent lymphedema risk reduction practices relevant to her dx specifically related to skin care.  Baseline:  No knowledge 06/23/2023 Achieved at eval  2 Pt will be able to return demo and/or verbalize understanding of the post op HEP related to regaining shoulder ROM. Baseline:  No knowledge 06/23/2023 Achieved at eval  3 Pt will be able to verbalize understanding of the importance of attending the post op After Breast CA Class for further lymphedema risk reduction education and therapeutic exercise.  Baseline:  No knowledge 06/23/2023 Achieved at eval  4 Pt will demo she has regained full shoulder ROM and function post operatively compared to baselines.  Baseline: See objective measurements taken today. 08/06/23     PLAN:  PT FREQUENCY/DURATION: EVAL and 1 follow up appointment.   PLAN FOR NEXT SESSION: will reassess 3-4 weeks post op to determine needs.   Patient will follow up at outpatient cancer rehab 3-4 weeks following surgery.  If the patient requires physical therapy at that time, a specific plan will be dictated and sent to the referring physician for approval. The patient was educated today on appropriate basic range of motion exercises to begin post operatively and the importance of attending the After Breast Cancer class following surgery.  Patient was  educated today on lymphedema risk reduction practices as it pertains to recommendations that will benefit the patient immediately following surgery.  She verbalized good understanding.    Physical Therapy Information for After Breast Cancer  Surgery/Treatment:  Lymphedema is a swelling condition that you may be at risk for in your arm if you have lymph nodes removed from the armpit area.  After a sentinel node biopsy, the risk is approximately 5-9% and is higher after an axillary node dissection.  There is treatment available for this condition and it is not life-threatening.  Contact your physician or physical therapist with concerns. You may begin the 4 shoulder/posture exercises (see additional sheet) when permitted by your physician (typically a week after surgery).  If you have drains, you may need to wait until those are removed before beginning range of motion exercises.  A general recommendation is to not lift your arms above shoulder height until drains are removed.  These exercises should be done to your tolerance and gently.  This is not a no pain/no gain type of recovery so listen to your body and stretch into the range of motion that you can tolerate, stopping if you have pain.  If you are having immediate reconstruction, ask your plastic surgeon about doing exercises as he or she may want you to wait. We encourage you to attend the free one time ABC (After Breast Cancer) class offered by [facility].  You will learn information related to lymphedema risk, prevention and treatment and additional exercises to regain mobility following surgery.  You can call [phone] for more information.  This is offered the 1st and 3rd Monday of each month.  You only attend the class one time. While undergoing any medical procedure or treatment, try to avoid blood pressure being taken or needle sticks from occurring on the arm on the side of cancer.   This recommendation begins after surgery and continues for the rest of your life.  This may help reduce your risk of getting lymphedema (swelling in your arm). An excellent resource for those seeking information on lymphedema is the National Lymphedema Network's web site. It  can be accessed at www.lymphnet.org If you notice swelling in your hand, arm or breast at any time following surgery (even if it is many years from now), please contact your doctor or physical therapist to discuss this.  Lymphedema can be treated at any time but it is easier for you if it is treated early on.  If you feel like your shoulder motion is not returning to normal in a reasonable amount of time, please contact your surgeon or physical therapist.  [facility] [phone]. [address], [place] [address]  ABC CLASS After Breast Cancer Class  After Breast Cancer Class is a specially designed exercise class to assist you in a safe recover after having breast cancer surgery.  In this class you will learn how to get back to full function whether your drains were just removed or if you had surgery a month ago.  This one-time class is held the 1st and 3rd Monday of every month from 11:00 a.m. until 12:00 noon virtually.  This class is FREE and space is limited. For more information or to register for the next available class, call [phone].  Class Goals  Understand specific stretches to improve the flexibility of you chest and shoulder. Learn ways to safely strengthen your upper body and improve your posture.  Understand the warning signs of infection and why you may be at risk for an arm infection. Learn about Lymphedema and prevention.  ** You do not attend this class until after surgery.  Drains must be removed to participate  Patient was instructed today in a home exercise program today for post op shoulder range of motion. These included active assist shoulder flexion in sitting, scapular retraction, wall walking with shoulder abduction, and hands behind head external rotation.  She was encouraged to do these twice a day, holding 3 seconds and repeating 5 times when permitted by her physician.    Larue Saddie SAUNDERS, PT 06/23/2023,  11:48 AM

## 2023-06-23 ENCOUNTER — Other Ambulatory Visit: Payer: Self-pay

## 2023-06-23 ENCOUNTER — Encounter: Payer: Self-pay | Admitting: Rehabilitation

## 2023-06-23 ENCOUNTER — Ambulatory Visit: Payer: Medicare HMO | Attending: Surgery | Admitting: Rehabilitation

## 2023-06-23 DIAGNOSIS — Z9189 Other specified personal risk factors, not elsewhere classified: Secondary | ICD-10-CM | POA: Diagnosis not present

## 2023-06-23 DIAGNOSIS — R293 Abnormal posture: Secondary | ICD-10-CM | POA: Insufficient documentation

## 2023-06-23 DIAGNOSIS — C50011 Malignant neoplasm of nipple and areola, right female breast: Secondary | ICD-10-CM | POA: Diagnosis not present

## 2023-06-23 NOTE — H&P (Signed)
 istory of Present Illness: Rhonda Paul is a 73 y.o. female who is seen today for follow-up prior to right simple mastectomy later this month for breast cancer. She has seen her oncologist and chemotherapy will be administered as well. We reviewed Port-A-Cath placement which she has had before. She had no further questions..    Review of Systems: A complete review of systems was obtained from the patient. I have reviewed this information and discussed as appropriate with the patient. See HPI as well for other ROS.    Medical History: Past Medical History:  Diagnosis Date  Arrhythmia  History of cancer  History of stroke  Sleep apnea   There is no problem list on file for this patient.  Past Surgical History:  Procedure Laterality Date  APPENDECTOMY  lumpe    No Known Allergies  Current Outpatient Medications on File Prior to Visit  Medication Sig Dispense Refill  apixaban  (ELIQUIS ) 5 mg tablet Take 5 mg by mouth every 12 (twelve) hours  atorvastatin  (LIPITOR ) 40 MG tablet Take 40 mg by mouth once daily  biotin 5,000 mcg Chew Take by mouth  ezetimibe  (ZETIA ) 10 mg tablet Take 10 mg by mouth once daily  FARXIGA  5 mg tablet Take 5 mg by mouth once daily  levothyroxine  (SYNTHROID ) 25 MCG tablet Take 25 mcg by mouth once daily Take on an empty stomach with a glass of water at least 30-60 minutes before breakfast.  metoprolol  SUCCinate (TOPROL -XL) 50 MG XL tablet Take 50 mg by mouth once daily   No current facility-administered medications on file prior to visit.   Family History  Problem Relation Age of Onset  Coronary Artery Disease (Blocked arteries around heart) Father    Social History   Tobacco Use  Smoking Status Never  Smokeless Tobacco Never    Social History   Socioeconomic History  Marital status: Married  Tobacco Use  Smoking status: Never  Smokeless tobacco: Never  Substance and Sexual Activity  Alcohol use: Yes  Drug use: Never   Social Drivers  of Health   Food Insecurity: No Food Insecurity (05/28/2023)  Received from [facility]  Hunger Vital Sign  Worried About Running Out of Food in the Last Year: Never true  Ran Out of Food in the Last Year: Never true  Transportation Needs: No Transportation Needs (05/28/2023)  [facility] from [facility]  Lack of Transportation (Medical): No  Lack of Transportation (Non-Medical): No  Housing Stability: Unknown (06/13/2023)  Housing Stability Vital Sign  Homeless in the Last Year: No   Objective:   Vitals:  06/13/23 0914  PainSc: 0-No pain   There is no height or weight on file to calculate BMI.  Deferred by patient today  Assessment and Plan:   Diagnoses and all orders for this visit:  Breast cancer, stage 1, right (CMS/HHS-HCC)    Discussed port placement today. Reviewed complications of bleeding, infection, DVT, catheter migration, injury to neighboring structures, cardiovascular event, anesthesia event. Reviewed mastectomy with sentinel lymph node mapping as well. She has no further questions and wishes to proceed. She desires referral to physical therapy which will start.  No follow-ups on file.  DEBBY CURTISTINE SHIPPER, MD

## 2023-06-30 ENCOUNTER — Other Ambulatory Visit (HOSPITAL_COMMUNITY): Payer: Medicare HMO

## 2023-07-01 ENCOUNTER — Encounter (HOSPITAL_COMMUNITY): Payer: Self-pay

## 2023-07-01 DIAGNOSIS — G4733 Obstructive sleep apnea (adult) (pediatric): Secondary | ICD-10-CM | POA: Diagnosis not present

## 2023-07-01 DIAGNOSIS — C50911 Malignant neoplasm of unspecified site of right female breast: Secondary | ICD-10-CM | POA: Diagnosis not present

## 2023-07-01 NOTE — Pre-Procedure Instructions (Signed)
Surgical Instructions   Your procedure is scheduled on July 04, 2023. Report to [facility] Main Entrance "A" at 5:30 A.M., then check in with the Admitting office. Any questions or running late day of surgery: call [phone]  Questions prior to your surgery date: call [phone], Monday-Friday, 8am-4pm. If you experience any cold or flu symptoms such as cough, fever, chills, shortness of breath, etc. between now and your scheduled surgery, please notify us at the above number.     Remember:  Do not eat after midnight the night before your surgery   You may drink clear liquids until 4:30 AM the morning of your surgery.   Clear liquids allowed are: Water, Non-Citrus Juices (without pulp), Carbonated Beverages, Clear Tea (no milk, honey, etc.), Black Coffee Only (NO MILK, CREAM OR POWDERED CREAMER of any kind), and Gatorade.    Take these medicines the morning of surgery with A SIP OF WATER: atorvastatin (LIPITOR)  ezetimibe (ZETIA)  ipratropium (ATROVENT) nasal spray  levothyroxine (SYNTHROID)  metoprolol succinate (TOPROL-XL)    May take these medicines IF NEEDED: nitroGLYCERIN (NITROSTAT) - if dose taken prior to surgery, please call either of the above phone numbers   Follow your surgeon's instructions on when to stop apixaban (ELIQUIS).  If no instructions were given by your surgeon then you will need to call the office to get those instructions.     STOP taking your dapagliflozin propanediol (FARXIGA) three days prior to surgery. Your last dose will be January 20th.   One week prior to surgery, STOP taking any Aspirin (unless otherwise instructed by your surgeon) Aleve, Naproxen, Ibuprofen, Motrin, Advil, Goody's, BC's, all herbal medications, fish oil, and non-prescription vitamins.                     Do NOT Smoke (Tobacco/Vaping) for 24 hours prior to your procedure.  If you use a CPAP at night, you may bring your mask/headgear for your overnight stay.   You  will be asked to remove any contacts, glasses, piercing's, hearing aid's, dentures/partials prior to surgery. Please bring cases for these items if needed.    Patients discharged the day of surgery will not be allowed to drive home, and someone needs to stay with them for 24 hours.  SURGICAL WAITING ROOM VISITATION Patients may have no more than 2 support people in the waiting area - these visitors may rotate.   Pre-op nurse will coordinate an appropriate time for 1 ADULT support person, who may not rotate, to accompany patient in pre-op.  Children under the age of 68 must have an adult with them who is not the patient and must remain in the main waiting area with an adult.  If the patient needs to stay at the hospital during part of their recovery, the visitor guidelines for inpatient rooms apply.  Please refer to the [facility] website for the visitor guidelines for any additional information.   If you received a COVID test during your pre-op visit  it is requested that you wear a mask when out in public, stay away from anyone that may not be feeling well and notify your surgeon if you develop symptoms. If you have been in contact with anyone that has tested positive in the last 10 days please notify you surgeon.      Pre-operative CHG Bathing Instructions   You can play a key role in reducing the risk of infection after surgery. Your skin needs to be as free of  germs as possible. You can reduce the number of germs on your skin by washing with CHG (chlorhexidine gluconate) soap before surgery. CHG is an antiseptic soap that kills germs and continues to kill germs even after washing.   DO NOT use if you have an allergy to chlorhexidine/CHG or antibacterial soaps. If your skin becomes reddened or irritated, stop using the CHG and notify one of our RNs at [phone].              TAKE A SHOWER THE NIGHT BEFORE SURGERY AND THE DAY OF SURGERY    Please keep in mind the following:  DO NOT  shave, including legs and underarms, 48 hours prior to surgery.   You may shave your face before/day of surgery.  Place clean sheets on your bed the night before surgery Use a clean washcloth (not used since being washed) for each shower. DO NOT sleep with pet's night before surgery.  CHG Shower Instructions:  Wash your face and private area with normal soap. If you choose to wash your hair, wash first with your normal shampoo.  After you use shampoo/soap, rinse your hair and body thoroughly to remove shampoo/soap residue.  Turn the water OFF and apply half the bottle of CHG soap to a CLEAN washcloth.  Apply CHG soap ONLY FROM YOUR NECK DOWN TO YOUR TOES (washing for 3-5 minutes)  DO NOT use CHG soap on face, private areas, open wounds, or sores.  Pay special attention to the area where your surgery is being performed.  If you are having back surgery, having someone wash your back for you may be helpful. Wait 2 minutes after CHG soap is applied, then you may rinse off the CHG soap.  Pat dry with a clean towel  Put on clean pajamas    Additional instructions for the day of surgery: DO NOT APPLY any lotions, deodorants, cologne, or perfumes.   Do not wear jewelry or makeup Do not wear nail polish, gel polish, artificial nails, or any other type of covering on natural nails (fingers and toes) Do not bring valuables to the hospital. [facility] is not responsible for valuables/personal belongings. Put on clean/comfortable clothes.  Please brush your teeth.  Ask your nurse before applying any prescription medications to the skin.

## 2023-07-02 ENCOUNTER — Other Ambulatory Visit: Payer: Self-pay

## 2023-07-02 ENCOUNTER — Encounter (HOSPITAL_COMMUNITY): Payer: Self-pay

## 2023-07-02 ENCOUNTER — Encounter (HOSPITAL_COMMUNITY)
Admission: RE | Admit: 2023-07-02 | Discharge: 2023-07-02 | Disposition: A | Discharge status: Home / Self Care | Payer: Medicare HMO | Source: Ambulatory Visit | Attending: Surgery | Admitting: Surgery

## 2023-07-02 VITALS — BP 130/74 | HR 66 | Temp 97.6°F | Resp 18 | Ht 65.0 in | Wt 145.3 lb

## 2023-07-02 DIAGNOSIS — Z01812 Encounter for preprocedural laboratory examination: Secondary | ICD-10-CM | POA: Insufficient documentation

## 2023-07-02 DIAGNOSIS — Z8673 Personal history of transient ischemic attack (TIA), and cerebral infarction without residual deficits: Secondary | ICD-10-CM | POA: Diagnosis not present

## 2023-07-02 DIAGNOSIS — I2510 Atherosclerotic heart disease of native coronary artery without angina pectoris: Secondary | ICD-10-CM | POA: Diagnosis not present

## 2023-07-02 DIAGNOSIS — I358 Other nonrheumatic aortic valve disorders: Secondary | ICD-10-CM | POA: Insufficient documentation

## 2023-07-02 DIAGNOSIS — G4733 Obstructive sleep apnea (adult) (pediatric): Secondary | ICD-10-CM | POA: Diagnosis not present

## 2023-07-02 DIAGNOSIS — Z7901 Long term (current) use of anticoagulants: Secondary | ICD-10-CM | POA: Insufficient documentation

## 2023-07-02 DIAGNOSIS — I081 Rheumatic disorders of both mitral and tricuspid valves: Secondary | ICD-10-CM | POA: Insufficient documentation

## 2023-07-02 DIAGNOSIS — I4821 Permanent atrial fibrillation: Secondary | ICD-10-CM | POA: Diagnosis not present

## 2023-07-02 HISTORY — DX: Cardiac murmur, unspecified: R01.1

## 2023-07-02 HISTORY — DX: Atherosclerotic heart disease of native coronary artery without angina pectoris: I25.10

## 2023-07-02 HISTORY — DX: Hypothyroidism, unspecified: E03.9

## 2023-07-02 HISTORY — DX: Cardiac arrhythmia, unspecified: I49.9

## 2023-07-02 HISTORY — DX: Heart failure, unspecified: I50.9

## 2023-07-02 HISTORY — DX: Sleep apnea, unspecified: G47.30

## 2023-07-02 LAB — CBC
HCT: 41.8 % (ref 36.0–46.0)
Hemoglobin: 13.8 g/dL (ref 12.0–15.0)
MCH: 33.2 pg (ref 26.0–34.0)
MCHC: 33 g/dL (ref 30.0–36.0)
MCV: 100.5 fL — ABNORMAL HIGH (ref 80.0–100.0)
Platelets: 211 10*3/uL (ref 150–400)
RBC: 4.16 MIL/uL (ref 3.87–5.11)
RDW: 14.1 % (ref 11.5–15.5)
WBC: 5.3 10*3/uL (ref 4.0–10.5)
nRBC: 0 % (ref 0.0–0.2)

## 2023-07-02 LAB — BASIC METABOLIC PANEL
Anion gap: 9 (ref 5–15)
BUN: 10 mg/dL (ref 8–23)
CO2: 27 mmol/L (ref 22–32)
Calcium: 9.5 mg/dL (ref 8.9–10.3)
Chloride: 100 mmol/L (ref 98–111)
Creatinine, Ser: 0.87 mg/dL (ref 0.44–1.00)
GFR, Estimated: >60 mL/min (ref >60)
Glucose, Bld: 110 mg/dL — ABNORMAL HIGH (ref 70–99)
Potassium: 5.3 mmol/L — ABNORMAL HIGH (ref 3.5–5.1)
Sodium: 136 mmol/L (ref 135–145)

## 2023-07-02 NOTE — Progress Notes (Signed)
PCP - Dr. Sigmund Hazel Cardiologist - Dr. Jodelle Red - Last office visit 03/26/2023  PPM/ICD - Denies Device Orders - n/a Rep Notified - n/a  Chest x-ray - n/a EKG - 08/21/2022 Stress Test - 10/08/2022 ECHO - 05/30/2023 Cardiac Cath - 01/16/2015  Sleep Study - +OSA. Pt wears CPAP nightly. Pressure setting is 4  No DM  Last dose of GLP1 agonist- n/a GLP1 instructions: n/a GLP2 Instructions: Instructions were to hold 3 days. Pt took dose today, 1/22. Pt instructed to NOT take anymore doses until after surgery  Blood Thinner Instructions: Pt instructed to hold Eliquis 2 days. Last dose was January 21st Aspirin Instructions: n/a  ERAS Protcol - Clear liquids until 0430 morning of surgery PRE-SURGERY Ensure or G2- n/a  COVID TEST- n/a   Anesthesia review: Yes. Cardiac Clearance.   Patient denies shortness of breath, fever, cough and chest pain at PAT appointment. Pt denies any respiratory illness/infection in the last two months.   All instructions explained to the patient, with a verbal understanding of the material. Patient agrees to go over the instructions while at home for a better understanding. Patient also instructed to self quarantine after being tested for COVID-19. The opportunity to ask questions was provided.

## 2023-07-03 NOTE — Anesthesia Preprocedure Evaluation (Addendum)
Anesthesia Evaluation  Patient identified by MRN, date of birth, ID band Patient awake    Reviewed: Allergy & Precautions, NPO status , Patient's Chart, lab work & pertinent test results, reviewed documented beta blocker date and time   History of Anesthesia Complications Negative for: history of anesthetic complications  Airway Mallampati: IV  TM Distance: <3 FB Neck ROM: Full    Dental  (+) Teeth Intact, Dental Advisory Given   Pulmonary neg shortness of breath, sleep apnea , neg COPD, neg recent URI   breath sounds clear to auscultation       Cardiovascular + CAD, + Past MI and +CHF   Rhythm:Regular   1. Left ventricular ejection fraction, by estimation, is 60 to 65%. The  left ventricle has normal function. The left ventricle has no regional  wall motion abnormalities. Left ventricular diastolic function could not  be evaluated. The average left  ventricular global longitudinal strain is -19.2 %. The global longitudinal  strain is normal.   2. Right ventricular systolic function is normal. The right ventricular  size is normal. There is mildly elevated pulmonary artery systolic  pressure. The estimated right ventricular systolic pressure is 40.3 mmHg.   3. Left atrial size was severely dilated.   4. The mitral valve is abnormal. Mild to moderate mitral valve  regurgitation.   5. Tricuspid valve regurgitation is mild to moderate.   6. The aortic valve is tricuspid. Aortic valve regurgitation is not  visualized.   7. The inferior vena cava is normal in size with <50% respiratory  variability, suggesting right atrial pressure of 8 mmHg.     1. 1st Mrg lesion, 75% stenosed. 2. Prox RCA lesion, 30% stenosed. 3. The left ventricular systolic function is normal.    Moderately severe first obtuse marginal stenosis. The marginal is a relatively small vessel in distribution and diameter. This likely represents the culprit for  the patient's presentation.  30% proximal RCA narrowing with more severe narrowing on catheter engagement due to spasm. The spasm was relieved with intracoronary nitroglycerin.  Widely patent LAD  Normal LV function   Recommendations:    Sublingual nitroglycerin for chest discomfort  Consider low-dose long-acting nitrates if recurring episodes of chest discomfort.  Eligible for discharge in a.m. assuming no complications.      Neuro/Psych  Headaches CVA    GI/Hepatic negative GI ROS,,,  Endo/Other  Hypothyroidism    Renal/GU Lab Results      Component                Value               Date                      NA                       136                 07/02/2023                K                        5.3 (H)             07/02/2023                CO2  27                  07/02/2023                GLUCOSE                  110 (H)             07/02/2023                BUN                      10                  07/02/2023                CREATININE               0.87                07/02/2023                CALCIUM                  9.5                 07/02/2023                EGFR                     71                  03/24/2023                GFRNONAA                 >60                 07/02/2023                Musculoskeletal  (+) Arthritis ,    Abdominal   Peds  Hematology negative hematology ROS (+) Lab Results      Component                Value               Date                      WBC                      5.3                 07/02/2023                HGB                      13.8                07/02/2023                HCT                      41.8                07/02/2023                MCV  100.5 (H)           07/02/2023                PLT                      211                 07/02/2023              Anesthesia Other Findings   Reproductive/Obstetrics                               Anesthesia Physical Anesthesia Plan  ASA: 2  Anesthesia Plan: General and Regional   Post-op Pain Management: Regional block*, Tylenol PO (pre-op)* and Toradol IV (intra-op)*   Induction: Intravenous  PONV Risk Score and Plan: 4 or greater and Ondansetron, Dexamethasone, Propofol infusion and TIVA  Airway Management Planned: Oral ETT  Additional Equipment: None  Intra-op Plan:   Post-operative Plan: Extubation in OR  Informed Consent: I have reviewed the patients History and Physical, chart, labs and discussed the procedure including the risks, benefits and alternatives for the proposed anesthesia with the patient or authorized representative who has indicated his/her understanding and acceptance.     Dental advisory given  Plan Discussed with: CRNA  Anesthesia Plan Comments: (PAT note by Antionette Poles, PA-C: 73 year old female follows with cardiology for history of nonobstructive CAD (cardiac PET without ischemia), OSA on CPAP, embolic CVA 2016, permanent A-fib on Eliquis, combined heart failure, moderate mitral regurgitation.  Most recent echo 05/30/2023 showed recovery of LV systolic function with EF 60 to 65%, normal wall motion.  Clearance per telephone counter 06/19/2023 by Carlos Levering, NP, "Chart reviewed as part of pre-operative protocol coverage. According to the RCRI, patient has a 6.6% risk of MACE. Patient reports activity equivalent to >4.0 METS (performs yoga twice a week and walks 20-30 minutes on the other days of the week). Given past medical history and time since last visit, based on ACC/AHA guidelines, SYNTHIA FAIRBANK would be at acceptable risk for the planned procedure without further cardiovascular testing. Patient was advised that if she develops new symptoms prior to surgery to contact our office to arrange a follow-up appointment.  she verbalized understanding. Per Pharm D, patient may hold Eliquis for 2 days prior to procedure.  Patient  will not need bridging with Lovenox (enoxaparin) around procedure."  Patient reports last dose Eliquis 07/01/2023.  Preop labs reviewed, mild hyperkalemia potassium 5.3, otherwise unremarkable.  EKG 08/21/2022: Atrial fibrillation.  Rate 77.  Left axis deviation.  TTE 05/30/2023: 1. Left ventricular ejection fraction, by estimation, is 60 to 65%. The  left ventricle has normal function. The left ventricle has no regional  wall motion abnormalities. Left ventricular diastolic function could not  be evaluated. The average left  ventricular global longitudinal strain is -19.2 %. The global longitudinal  strain is normal.   2. Right ventricular systolic function is normal. The right ventricular  size is normal. There is mildly elevated pulmonary artery systolic  pressure. The estimated right ventricular systolic pressure is 40.3 mmHg.   3. Left atrial size was severely dilated.   4. The mitral valve is abnormal. Mild to moderate mitral valve  regurgitation.   5. Tricuspid valve regurgitation is mild to moderate.   6. The aortic valve is tricuspid. Aortic valve regurgitation is not  visualized.   7. The  inferior vena cava is normal in size with <50% respiratory  variability, suggesting right atrial pressure of 8 mmHg.   Cardiac PET 10/08/2022:   The study demonstrates normal perfusion. The study is intermediate risk due to abnormal myocardial blood flow reserve of 1.6 and no augmentation of EF with stress. These findings may represent multivessel CAD vs microvascular disease. Coronary calcifications noted in the proximal LAD and RCA.   LV perfusion is normal. There is no evidence of ischemia. There is no evidence of infarction.   Rest left ventricular function is normal. Rest EF: 58 %. Stress left ventricular function is normal. Stress EF: 58 %. EF visually appears preserved, however calculation may be affected by atrial fibrillation. End diastolic cavity size is normal. End systolic cavity  size is normal. No evidence of transient ischemic dilation (TID) noted (TID 1.07).   Myocardial blood flow was computed to be 0.58ml/g/min at rest and 1.44ml/g/min at stress. Global myocardial blood flow reserve was 1.63 and was abnormal.   Coronary calcium was present on the attenuation correction CT images. Moderate coronary calcifications were present. Coronary calcifications were present in the left anterior descending artery and right coronary artery distribution(s). Mild aortic valve calcifications.  )         Anesthesia Quick Evaluation

## 2023-07-03 NOTE — Progress Notes (Signed)
Anesthesia Chart Review:  74 year old female follows with cardiology for history of nonobstructive CAD (cardiac PET without ischemia), OSA on CPAP, embolic CVA 2016, permanent A-fib on Eliquis, combined heart failure, moderate mitral regurgitation.  Most recent echo 05/30/2023 showed recovery of LV systolic function with EF 60 to 65%, normal wall motion.  Clearance per telephone counter 06/19/2023 by Carlos Levering, NP, "Chart reviewed as part of pre-operative protocol coverage. According to the RCRI, patient has a 6.6% risk of MACE. Patient reports activity equivalent to >4.0 METS (performs yoga twice a week and walks 20-30 minutes on the other days of the week). Given past medical history and time since last visit, based on ACC/AHA guidelines, LASUNDRA PILLARS would be at acceptable risk for the planned procedure without further cardiovascular testing. Patient was advised that if she develops new symptoms prior to surgery to contact our office to arrange a follow-up appointment.  she verbalized understanding. Per Pharm D, patient may hold Eliquis for 2 days prior to procedure.  Patient will not need bridging with Lovenox (enoxaparin) around procedure."  Patient reports last dose Eliquis 07/01/2023.  Preop labs reviewed, mild hyperkalemia potassium 5.3, otherwise unremarkable.  EKG 08/21/2022: Atrial fibrillation.  Rate 77.  Left axis deviation.  TTE 05/30/2023: 1. Left ventricular ejection fraction, by estimation, is 60 to 65%. The  left ventricle has normal function. The left ventricle has no regional  wall motion abnormalities. Left ventricular diastolic function could not  be evaluated. The average left  ventricular global longitudinal strain is -19.2 %. The global longitudinal  strain is normal.   2. Right ventricular systolic function is normal. The right ventricular  size is normal. There is mildly elevated pulmonary artery systolic  pressure. The estimated right ventricular systolic  pressure is 40.3 mmHg.   3. Left atrial size was severely dilated.   4. The mitral valve is abnormal. Mild to moderate mitral valve  regurgitation.   5. Tricuspid valve regurgitation is mild to moderate.   6. The aortic valve is tricuspid. Aortic valve regurgitation is not  visualized.   7. The inferior vena cava is normal in size with <50% respiratory  variability, suggesting right atrial pressure of 8 mmHg.   Cardiac PET 10/08/2022:   The study demonstrates normal perfusion. The study is intermediate risk due to abnormal myocardial blood flow reserve of 1.6 and no augmentation of EF with stress. These findings may represent multivessel CAD vs microvascular disease. Coronary calcifications noted in the proximal LAD and RCA.   LV perfusion is normal. There is no evidence of ischemia. There is no evidence of infarction.   Rest left ventricular function is normal. Rest EF: 58 %. Stress left ventricular function is normal. Stress EF: 58 %. EF visually appears preserved, however calculation may be affected by atrial fibrillation. End diastolic cavity size is normal. End systolic cavity size is normal. No evidence of transient ischemic dilation (TID) noted (TID 1.07).   Myocardial blood flow was computed to be 0.43ml/g/min at rest and 1.40ml/g/min at stress. Global myocardial blood flow reserve was 1.63 and was abnormal.   Coronary calcium was present on the attenuation correction CT images. Moderate coronary calcifications were present. Coronary calcifications were present in the left anterior descending artery and right coronary artery distribution(s). Mild aortic valve calcifications.    Zannie Cove [facility] Short Stay Center/Anesthesiology Phone [phone] 07/03/2023 8:45 AM

## 2023-07-04 ENCOUNTER — Ambulatory Visit (HOSPITAL_COMMUNITY): Payer: Medicare HMO | Admitting: Certified Registered Nurse Anesthetist

## 2023-07-04 ENCOUNTER — Ambulatory Visit (HOSPITAL_COMMUNITY): Payer: Medicare HMO | Admitting: Physician Assistant

## 2023-07-04 ENCOUNTER — Encounter (HOSPITAL_COMMUNITY): Payer: Self-pay | Admitting: Surgery

## 2023-07-04 ENCOUNTER — Encounter: Payer: Self-pay | Admitting: *Deleted

## 2023-07-04 ENCOUNTER — Encounter (HOSPITAL_COMMUNITY)
Admission: RE | Disposition: A | Discharge status: Home / Self Care | Payer: Self-pay | Source: Home / Self Care | Attending: Surgery

## 2023-07-04 ENCOUNTER — Other Ambulatory Visit: Payer: Self-pay

## 2023-07-04 ENCOUNTER — Observation Stay (HOSPITAL_COMMUNITY)
Admission: RE | Admit: 2023-07-04 | Discharge: 2023-07-04 | Disposition: A | Discharge status: Home / Self Care | Payer: Medicare HMO | Attending: Surgery | Admitting: Surgery

## 2023-07-04 ENCOUNTER — Ambulatory Visit (HOSPITAL_COMMUNITY): Payer: Medicare HMO

## 2023-07-04 DIAGNOSIS — C50411 Malignant neoplasm of upper-outer quadrant of right female breast: Secondary | ICD-10-CM | POA: Diagnosis not present

## 2023-07-04 DIAGNOSIS — Z7901 Long term (current) use of anticoagulants: Secondary | ICD-10-CM | POA: Diagnosis not present

## 2023-07-04 DIAGNOSIS — C50911 Malignant neoplasm of unspecified site of right female breast: Principal | ICD-10-CM

## 2023-07-04 DIAGNOSIS — Z452 Encounter for adjustment and management of vascular access device: Secondary | ICD-10-CM | POA: Diagnosis not present

## 2023-07-04 DIAGNOSIS — Z170 Estrogen receptor positive status [ER+]: Secondary | ICD-10-CM | POA: Diagnosis not present

## 2023-07-04 DIAGNOSIS — Z8673 Personal history of transient ischemic attack (TIA), and cerebral infarction without residual deficits: Secondary | ICD-10-CM | POA: Insufficient documentation

## 2023-07-04 DIAGNOSIS — Z79899 Other long term (current) drug therapy: Secondary | ICD-10-CM | POA: Diagnosis not present

## 2023-07-04 DIAGNOSIS — G8918 Other acute postprocedural pain: Secondary | ICD-10-CM | POA: Diagnosis not present

## 2023-07-04 HISTORY — PX: PORTACATH PLACEMENT: SHX2246

## 2023-07-04 HISTORY — PX: MASTECTOMY W/ SENTINEL NODE BIOPSY: SHX2001

## 2023-07-04 SURGERY — MASTECTOMY WITH SENTINEL LYMPH NODE BIOPSY
Anesthesia: Regional | Site: Neck | Laterality: Right

## 2023-07-04 MED ORDER — OXYCODONE HCL 5 MG PO TABS: 5.0000 mg | ORAL_TABLET | Freq: Four times a day (QID) | ORAL | 0 refills | Status: DC | PRN

## 2023-07-04 MED ORDER — PROPOFOL 500 MG/50ML IV EMUL
INTRAVENOUS | Status: DC | PRN
  Administered 2023-07-04: 150 ug/kg/min via INTRAVENOUS

## 2023-07-04 MED ORDER — HEPARIN SOD (PORK) LOCK FLUSH 100 UNIT/ML IV SOLN
INTRAVENOUS | Status: AC
  Filled 2023-07-04: qty 5

## 2023-07-04 MED ORDER — ORAL CARE MOUTH RINSE: 15.0000 mL | Freq: Once | OROMUCOSAL | Status: AC

## 2023-07-04 MED ORDER — MIDAZOLAM HCL 2 MG/2ML IJ SOLN
INTRAMUSCULAR | Status: AC
  Filled 2023-07-04: qty 2

## 2023-07-04 MED ORDER — LIDOCAINE HCL (CARDIAC) PF 100 MG/5ML IV SOSY
PREFILLED_SYRINGE | INTRAVENOUS | Status: DC | PRN
  Administered 2023-07-04: 40 mg via INTRATRACHEAL

## 2023-07-04 MED ORDER — HEPARIN 6000 UNIT IRRIGATION SOLUTION
Status: AC
  Filled 2023-07-04: qty 500

## 2023-07-04 MED ORDER — ATORVASTATIN CALCIUM 10 MG PO TABS: 20.0000 mg | ORAL_TABLET | Freq: Every day | ORAL | Status: DC

## 2023-07-04 MED ORDER — LIDOCAINE-EPINEPHRINE (PF) 1.5 %-1:200000 IJ SOLN
INTRAMUSCULAR | Status: DC | PRN
  Administered 2023-07-04: 5 mL via PERINEURAL

## 2023-07-04 MED ORDER — DAPAGLIFLOZIN PROPANEDIOL 10 MG PO TABS: 10.0000 mg | ORAL_TABLET | Freq: Every day | ORAL | Status: DC

## 2023-07-04 MED ORDER — FENTANYL CITRATE (PF) 250 MCG/5ML IJ SOLN
INTRAMUSCULAR | Status: AC
  Filled 2023-07-04: qty 5

## 2023-07-04 MED ORDER — ACETAMINOPHEN 500 MG PO TABS: 1000.0000 mg | ORAL_TABLET | Freq: Four times a day (QID) | ORAL | Status: DC

## 2023-07-04 MED ORDER — ACETAMINOPHEN 10 MG/ML IV SOLN: 1000.0000 mg | Freq: Once | INTRAVENOUS | Status: DC | PRN

## 2023-07-04 MED ORDER — IPRATROPIUM BROMIDE 0.03 % NA SOLN: 1.0000 | Freq: Every day | NASAL | Status: DC

## 2023-07-04 MED ORDER — FENTANYL CITRATE (PF) 250 MCG/5ML IJ SOLN
INTRAMUSCULAR | Status: DC | PRN
  Administered 2023-07-04: 25 ug via INTRAVENOUS
  Administered 2023-07-04 (×3): 50 ug via INTRAVENOUS
  Administered 2023-07-04 (×3): 25 ug via INTRAVENOUS

## 2023-07-04 MED ORDER — METOPROLOL SUCCINATE ER 25 MG PO TB24
50.0000 mg | ORAL_TABLET | Freq: Every day | ORAL | Status: DC
  Administered 2023-07-04: 50 mg via ORAL
  Filled 2023-07-04: qty 2

## 2023-07-04 MED ORDER — ENOXAPARIN SODIUM 40 MG/0.4ML IJ SOSY: 40.0000 mg | PREFILLED_SYRINGE | INTRAMUSCULAR | Status: DC

## 2023-07-04 MED ORDER — BUPIVACAINE-EPINEPHRINE 0.25% -1:200000 IJ SOLN
INTRAMUSCULAR | Status: DC | PRN
  Administered 2023-07-04: 10 mL

## 2023-07-04 MED ORDER — ONDANSETRON HCL 4 MG/2ML IJ SOLN
INTRAMUSCULAR | Status: DC | PRN
  Administered 2023-07-04: 4 mg via INTRAVENOUS

## 2023-07-04 MED ORDER — FENTANYL CITRATE (PF) 100 MCG/2ML IJ SOLN: 25.0000 ug | INTRAMUSCULAR | Status: DC | PRN

## 2023-07-04 MED ORDER — CEFAZOLIN SODIUM-DEXTROSE 2-4 GM/100ML-% IV SOLN
2.0000 g | Freq: Three times a day (TID) | INTRAVENOUS | Status: AC
  Administered 2023-07-04: 2 g via INTRAVENOUS

## 2023-07-04 MED ORDER — ONDANSETRON HCL 4 MG/2ML IJ SOLN
INTRAMUSCULAR | Status: AC
Start: 2023-07-04 — End: ?
  Filled 2023-07-04: qty 2

## 2023-07-04 MED ORDER — ACETAMINOPHEN 160 MG/5ML PO SOLN: 1000.0000 mg | Freq: Once | ORAL | Status: DC | PRN

## 2023-07-04 MED ORDER — OXYCODONE HCL 5 MG PO TABS
5.0000 mg | ORAL_TABLET | Freq: Once | ORAL | Status: DC | PRN
Start: 2023-07-04 — End: 2023-07-04

## 2023-07-04 MED ORDER — LACTATED RINGERS IV SOLN: INTRAVENOUS | Status: DC

## 2023-07-04 MED ORDER — OXYCODONE HCL 5 MG/5ML PO SOLN: 5.0000 mg | Freq: Once | ORAL | Status: DC | PRN

## 2023-07-04 MED ORDER — ACETAMINOPHEN 500 MG PO TABS: 1000.0000 mg | ORAL_TABLET | Freq: Once | ORAL | Status: DC | PRN

## 2023-07-04 MED ORDER — ROCURONIUM BROMIDE 100 MG/10ML IV SOLN
INTRAVENOUS | Status: DC | PRN
  Administered 2023-07-04: 20 mg via INTRAVENOUS
  Administered 2023-07-04: 60 mg via INTRAVENOUS

## 2023-07-04 MED ORDER — SODIUM CHLORIDE 0.9 % IV SOLN: INTRAVENOUS | Status: DC

## 2023-07-04 MED ORDER — METOPROLOL TARTRATE 5 MG/5ML IV SOLN: 5.0000 mg | Freq: Four times a day (QID) | INTRAVENOUS | Status: DC | PRN

## 2023-07-04 MED ORDER — CEFAZOLIN SODIUM-DEXTROSE 2-4 GM/100ML-% IV SOLN
INTRAVENOUS | Status: AC
  Filled 2023-07-04: qty 100

## 2023-07-04 MED ORDER — DEXAMETHASONE SODIUM PHOSPHATE 10 MG/ML IJ SOLN
INTRAMUSCULAR | Status: AC
  Filled 2023-07-04: qty 1

## 2023-07-04 MED ORDER — BUPIVACAINE-EPINEPHRINE (PF) 0.5% -1:200000 IJ SOLN
INTRAMUSCULAR | Status: DC | PRN
  Administered 2023-07-04: 25 mL via PERINEURAL

## 2023-07-04 MED ORDER — PROPOFOL 10 MG/ML IV BOLUS
INTRAVENOUS | Status: DC | PRN
  Administered 2023-07-04: 140 mg via INTRAVENOUS

## 2023-07-04 MED ORDER — METHOCARBAMOL 500 MG PO TABS: 500.0000 mg | ORAL_TABLET | Freq: Four times a day (QID) | ORAL | Status: DC | PRN

## 2023-07-04 MED ORDER — PHENYLEPHRINE HCL-NACL 20-0.9 MG/250ML-% IV SOLN
INTRAVENOUS | Status: DC | PRN
  Administered 2023-07-04: 20 ug/min via INTRAVENOUS

## 2023-07-04 MED ORDER — CHLORHEXIDINE GLUCONATE CLOTH 2 % EX PADS: 6.0000 | MEDICATED_PAD | Freq: Once | CUTANEOUS | Status: DC

## 2023-07-04 MED ORDER — CEFAZOLIN SODIUM-DEXTROSE 2-4 GM/100ML-% IV SOLN
2.0000 g | INTRAVENOUS | Status: AC
  Administered 2023-07-04: 2 g via INTRAVENOUS
  Filled 2023-07-04: qty 100

## 2023-07-04 MED ORDER — HEPARIN 6000 UNIT IRRIGATION SOLUTION
Status: DC | PRN
  Administered 2023-07-04: 1

## 2023-07-04 MED ORDER — 0.9 % SODIUM CHLORIDE (POUR BTL) OPTIME
TOPICAL | Status: DC | PRN
  Administered 2023-07-04: 1000 mL

## 2023-07-04 MED ORDER — ONDANSETRON HCL 4 MG/2ML IJ SOLN: 4.0000 mg | Freq: Four times a day (QID) | INTRAMUSCULAR | Status: DC | PRN

## 2023-07-04 MED ORDER — BUPIVACAINE-EPINEPHRINE (PF) 0.25% -1:200000 IJ SOLN
INTRAMUSCULAR | Status: AC
  Filled 2023-07-04: qty 30

## 2023-07-04 MED ORDER — ACETAMINOPHEN 500 MG PO TABS
1000.0000 mg | ORAL_TABLET | ORAL | Status: AC
  Administered 2023-07-04: 1000 mg via ORAL
  Filled 2023-07-04: qty 2

## 2023-07-04 MED ORDER — ZOLPIDEM TARTRATE 5 MG PO TABS: 5.0000 mg | ORAL_TABLET | Freq: Every evening | ORAL | Status: DC | PRN

## 2023-07-04 MED ORDER — EZETIMIBE 10 MG PO TABS: 10.0000 mg | ORAL_TABLET | Freq: Every day | ORAL | Status: DC

## 2023-07-04 MED ORDER — METOPROLOL SUCCINATE ER 25 MG PO TB24: 25.0000 mg | ORAL_TABLET | Freq: Every day | ORAL | Status: DC

## 2023-07-04 MED ORDER — CHLORHEXIDINE GLUCONATE 0.12 % MT SOLN
15.0000 mL | Freq: Once | OROMUCOSAL | Status: AC
  Administered 2023-07-04: 15 mL via OROMUCOSAL
  Filled 2023-07-04: qty 15

## 2023-07-04 MED ORDER — ONDANSETRON 4 MG PO TBDP: 4.0000 mg | ORAL_TABLET | Freq: Four times a day (QID) | ORAL | Status: DC | PRN

## 2023-07-04 MED ORDER — NITROGLYCERIN 0.4 MG SL SUBL: 0.4000 mg | SUBLINGUAL_TABLET | SUBLINGUAL | Status: DC | PRN

## 2023-07-04 MED ORDER — ROCURONIUM BROMIDE 10 MG/ML (PF) SYRINGE
PREFILLED_SYRINGE | INTRAVENOUS | Status: AC
Start: 2023-07-04 — End: ?
  Filled 2023-07-04: qty 10

## 2023-07-04 MED ORDER — OXYCODONE HCL 5 MG PO TABS: 5.0000 mg | ORAL_TABLET | ORAL | Status: DC | PRN

## 2023-07-04 MED ORDER — PROPOFOL 10 MG/ML IV BOLUS
INTRAVENOUS | Status: AC
  Filled 2023-07-04: qty 20

## 2023-07-04 MED ORDER — LIDOCAINE 2% (20 MG/ML) 5 ML SYRINGE
INTRAMUSCULAR | Status: AC
  Filled 2023-07-04: qty 5

## 2023-07-04 MED ORDER — MAGTRACE LYMPHATIC TRACER
INTRAMUSCULAR | Status: DC | PRN
  Administered 2023-07-04: 2 mL via INTRAMUSCULAR

## 2023-07-04 MED ORDER — TRANEXAMIC ACID 1000 MG/10ML IV SOLN
2000.0000 mg | Freq: Once | INTRAVENOUS | Status: AC
  Administered 2023-07-04: 2000 mg via TOPICAL
  Filled 2023-07-04: qty 20

## 2023-07-04 MED ORDER — INSULIN ASPART 100 UNIT/ML IJ SOLN: 0.0000 [IU] | Freq: Three times a day (TID) | INTRAMUSCULAR | Status: DC

## 2023-07-04 MED ORDER — SUGAMMADEX SODIUM 200 MG/2ML IV SOLN
INTRAVENOUS | Status: DC | PRN
  Administered 2023-07-04: 200 mg via INTRAVENOUS

## 2023-07-04 MED ORDER — DEXAMETHASONE SODIUM PHOSPHATE 10 MG/ML IJ SOLN
INTRAMUSCULAR | Status: DC | PRN
  Administered 2023-07-04: 10 mg via INTRAVENOUS

## 2023-07-04 MED ORDER — HEPARIN SOD (PORK) LOCK FLUSH 100 UNIT/ML IV SOLN
INTRAVENOUS | Status: DC | PRN
  Administered 2023-07-04: 500 [IU]

## 2023-07-04 SURGICAL SUPPLY — 60 items
APPLIER CLIP 9.375 MED OPEN (MISCELLANEOUS) ×2
BAG COUNTER SPONGE SURGICOUNT (BAG) ×2 IMPLANT
BAG DECANTER FOR FLEXI CONT (MISCELLANEOUS) ×2 IMPLANT
BINDER BREAST LRG (GAUZE/BANDAGES/DRESSINGS) IMPLANT
BIOPATCH RED 1 DISK 7.0 (GAUZE/BANDAGES/DRESSINGS) IMPLANT
CANISTER SUCT 3000ML PPV (MISCELLANEOUS) ×2 IMPLANT
CHLORAPREP W/TINT 26 (MISCELLANEOUS) ×2 IMPLANT
CLIP APPLIE 9.375 MED OPEN (MISCELLANEOUS) ×2 IMPLANT
CNTNR URN SCR LID CUP LEK RST (MISCELLANEOUS) ×2 IMPLANT
COVER PROBE W GEL 5X96 (DRAPES) ×2 IMPLANT
COVER SURGICAL LIGHT HANDLE (MISCELLANEOUS) ×2 IMPLANT
COVER TRANSDUCER ULTRASND GEL (DISPOSABLE) ×2 IMPLANT
DERMABOND ADVANCED .7 DNX12 (GAUZE/BANDAGES/DRESSINGS) ×2 IMPLANT
DRAIN CHANNEL 19F RND (DRAIN) ×2 IMPLANT
DRAPE C-ARM 42X120 X-RAY (DRAPES) ×2 IMPLANT
DRAPE LAPAROSCOPIC ABDOMINAL (DRAPES) ×2 IMPLANT
DRSG IV TEGADERM 3.5X4.5 STRL (GAUZE/BANDAGES/DRESSINGS) IMPLANT
ELECT CAUTERY BLADE 6.4 (BLADE) ×2 IMPLANT
ELECT REM PT RETURN 9FT ADLT (ELECTROSURGICAL) ×2
ELECTRODE REM PT RTRN 9FT ADLT (ELECTROSURGICAL) ×2 IMPLANT
EVACUATOR SILICONE 100CC (DRAIN) ×2 IMPLANT
GAUZE 4X4 16PLY ~~LOC~~+RFID DBL (SPONGE) ×2 IMPLANT
GAUZE PAD ABD 7.5X8 STRL (GAUZE/BANDAGES/DRESSINGS) IMPLANT
GAUZE SPONGE 4X4 12PLY STRL (GAUZE/BANDAGES/DRESSINGS) IMPLANT
GEL ULTRASOUND 20GR AQUASONIC (MISCELLANEOUS) IMPLANT
GLOVE BIO SURGEON STRL SZ8 (GLOVE) ×2 IMPLANT
GLOVE BIOGEL PI IND STRL 8 (GLOVE) ×2 IMPLANT
GOWN STRL REUS W/ TWL LRG LVL3 (GOWN DISPOSABLE) ×6 IMPLANT
GOWN STRL REUS W/ TWL XL LVL3 (GOWN DISPOSABLE) ×2 IMPLANT
INTRODUCER COOK 11FR (CATHETERS) IMPLANT
KIT BASIN OR (CUSTOM PROCEDURE TRAY) ×2 IMPLANT
KIT PORT POWER 8FR ISP CVUE (Port) IMPLANT
KIT TURNOVER KIT B (KITS) ×2 IMPLANT
NDL 18GX1X1/2 (RX/OR ONLY) (NEEDLE) ×2 IMPLANT
NDL FILTER BLUNT 18X1 1/2 (NEEDLE) IMPLANT
NDL HYPO 25GX1X1/2 BEV (NEEDLE) ×2 IMPLANT
NEEDLE 18GX1X1/2 (RX/OR ONLY) (NEEDLE) ×2 IMPLANT
NEEDLE FILTER BLUNT 18X1 1/2 (NEEDLE) IMPLANT
NEEDLE HYPO 25GX1X1/2 BEV (NEEDLE) ×2 IMPLANT
NS IRRIG 1000ML POUR BTL (IV SOLUTION) ×2 IMPLANT
PACK GENERAL/GYN (CUSTOM PROCEDURE TRAY) ×2 IMPLANT
PAD ARMBOARD 7.5X6 YLW CONV (MISCELLANEOUS) ×2 IMPLANT
PENCIL BUTTON HOLSTER BLD 10FT (ELECTRODE) ×2 IMPLANT
PENCIL SMOKE EVACUATOR (MISCELLANEOUS) ×2 IMPLANT
POSITIONER HEAD DONUT 9IN (MISCELLANEOUS) ×2 IMPLANT
SET INTRODUCER 12FR PACEMAKER (INTRODUCER) IMPLANT
SET SHEATH INTRODUCER 10FR (MISCELLANEOUS) IMPLANT
SHEATH COOK PEEL AWAY SET 9F (SHEATH) IMPLANT
SPECIMEN JAR X LARGE (MISCELLANEOUS) ×2 IMPLANT
SUT ETHILON 3 0 FSL (SUTURE) ×2 IMPLANT
SUT MNCRL AB 4-0 PS2 18 (SUTURE) ×2 IMPLANT
SUT PROLENE 2 0 SH 30 (SUTURE) ×2 IMPLANT
SUT VIC AB 3-0 SH 18 (SUTURE) ×2 IMPLANT
SUT VIC AB 3-0 SH 27X BRD (SUTURE) ×2 IMPLANT
SYR 5ML LUER SLIP (SYRINGE) ×2 IMPLANT
SYR CONTROL 10ML LL (SYRINGE) ×2 IMPLANT
TOWEL GREEN STERILE (TOWEL DISPOSABLE) ×2 IMPLANT
TOWEL GREEN STERILE FF (TOWEL DISPOSABLE) ×2 IMPLANT
TRACER MAGTRACE VIAL (MISCELLANEOUS) IMPLANT
TRAY LAPAROSCOPIC MC (CUSTOM PROCEDURE TRAY) ×2 IMPLANT

## 2023-07-04 NOTE — Op Note (Signed)
Preoperative diagnosis: Stage I right breast cancer upper outer quadrant  Postoperative diagnosis: Same  Procedure: Right simple mastectomy with deep right axillary sentinel lymph node mapping using mag trace, placement of right 8 [place] internal jugular Port-A-Cath with ultrasound and C-arm guidance  Surgeon: Harriette Bouillon, MD   Assistant Betti Cruz RNFA  Anesthesia: General With right pectoral block and 0.25% Marcaine with epinephrine local  EBL: 40 cc  Drain: 19 round  Specimen: Right breast and 1 right axillary sentinel node  Indications for procedure: The patient is a 73 year old female with a remote history of breast cancer 20 years ago.  She underwent breast conserving surgery on the right for stage I right breast cancer in 2003 with adjuvant endocrine therapy.  She has developed a new breast cancer in the right breast and presents for mastectomy after being seen by medical oncology, radiation oncology and surgery.  She will also require chemotherapy due to her HER2/neu positive status.The surgical and non surgical options have been discussed with the patient.  Risks of surgery include bleeding,  Infection,  Flap necrosis,  Tissue loss,  Chronic pain, death, Numbness,  And the need for additional procedures.  Reconstruction options also have been discussed with the patient as well.  Risk of poor placement include bleeding, infection, pneumothorax, hemothorax, injury mediastinal structures, injury structures and neck, catheter migration, catheter failure and the need further additional treatment center procedures.  The patient agrees to proceed.     Description of procedure: The patient was met in the holding area questions were answered.  The right breast was marked as correct site.  Pectoral block administered by anesthesia.  She was taken back to the operative room.  She was placed upon upon the OR table.  After induction of general esthesia, 2 cc of mag trace were injected in the  right breast under sterile conditions.  The right breast right neck was then prepped and draped in sterile fashion and a second timeout performed.  Proper patient, site and procedure verified and she received appropriate preoperative antibiotics.  The port was placed first.  Ultrasound used to identify the right internal jugular vein.  It was quite small.  A needle was advanced carefully and the return of dark nonpulsatile blood encountered.  Wire fed through this easily.  C arm showed the wire progressing down the superior vena cava into the right atrium.  There is no ectopy though.  Small stab incision placed at wire insertion site.  A 2 cm incision made below the right clavicle.  Small pocket created for the port itself.  The port was then tunneled from the lower incision through the neck incision.  It was cut to 20 cm.  With the patient Trendelenburg the introducer complex was placed over the wire carefully moving the wire to and fro as was advanced with no resistance.  The wire and dilator were removed with the introducer left in place.  Catheter fed through the skin and the peel-away sheath peeled away without difficulty.  C arm to the tip to be in the mid SVC.  C arm images were taken which showed no pneumothorax.  The catheter is in good position and is ready for use.  It was evaluated.  It drew back easily.  It was flushed with heparinized saline and then 5 cc of heparin saline concentrate placed in the port itself.  It was secured to the chest wall with 2-0 Prolene.  Both incisions were closed with 3-0 Vicryl and 4  Monocryl.  The mastectomy was performed next.  Fishmouth incision used above and below the nipple areolar complex.  Skin flaps taken to the clavicle, sternum and to the inframammary fold.  The breast was then dissected in a medial lateral fashion off the pectoralis muscle to include the fascia until the lateral attachments were identified and divided.  This was oriented with a single stitch  and passed off the field.  Stitch marks superior.  Mag trace probe used and single sentinel node identified in the level 1 deep basin.  This was removed.  Background signal approached baseline.  Irrigation used.  TXA soaked sponge placed under flaps for 5 minutes.  This was removed and there is no signs of any bleeding.  19 round drain placed through a separate stab incision.  Secured to skin with 2-0 nylon.  Wound closed with a combination of 3-0 Vicryl and 4 Monocryl.  Dermabond applied.  Breast binder placed.  Drain placed to bulb suction.  All counts found to be correct.  Patient awoke extubated taken to recovery in satisfactory condition.

## 2023-07-04 NOTE — Discharge Instructions (Signed)
[facility] [phone]  MASTECTOMY: POST OP INSTRUCTIONS  Always review your discharge instruction sheet given to you by the facility where your surgery was performed. IF YOU HAVE DISABILITY OR FAMILY LEAVE FORMS, YOU MUST BRING THEM TO THE OFFICE FOR PROCESSING.   DO NOT GIVE THEM TO YOUR DOCTOR. A prescription for pain medication may be given to you upon discharge.  Take your pain medication as prescribed, if needed.  If narcotic pain medicine is not needed, then you may take acetaminophen (Tylenol) or ibuprofen (Advil) as needed. Take your usually prescribed medications unless otherwise directed. If you need a refill on your pain medication, please contact your pharmacy.  They will contact our office to request authorization.  Prescriptions will not be filled after 5pm or on week-ends. You should follow a light diet the first few days after arrival home, such as soup and crackers, etc.  Resume your normal diet the day after surgery. Most patients will experience some swelling and bruising on the chest and underarm.  Ice packs will help.  Swelling and bruising can take several days to resolve.  It is common to experience some constipation if taking pain medication after surgery.  Increasing fluid intake and taking a stool softener (such as Colace) will usually help or prevent this problem from occurring.  A mild laxative (Milk of Magnesia or Miralax) should be taken according to package instructions if there are no bowel movements after 48 hours. Unless discharge instructions indicate otherwise, leave your bandage dry and in place until your next appointment in 3-5 days.  You may take a limited sponge bath.  No tube baths or showers until the drains are removed.  You may have steri-strips (small skin tapes) in place directly over the incision.  These strips should be left on the skin for 7-10 days.  If your surgeon used skin glue on the incision, you may shower in 24 hours.   The glue will flake off over the next 2-3 weeks.  Any sutures or staples will be removed at the office during your follow-up visit. DRAINS:  If you have drains in place, it is important to keep a list of the amount of drainage produced each day in your drains.  Before leaving the hospital, you should be instructed on drain care.  Call our office if you have any questions about your drains. ACTIVITIES:  You may resume regular (light) daily activities beginning the next day--such as daily self-care, walking, climbing stairs--gradually increasing activities as tolerated.  You may have sexual intercourse when it is comfortable.  Refrain from any heavy lifting or straining until approved by your doctor. You may drive when you are no longer taking prescription pain medication, you can comfortably wear a seatbelt, and you can safely maneuver your car and apply brakes. RETURN TO WORK:  __________________________________________________________ Bonita Quin should see your doctor in the office for a follow-up appointment approximately 3-5 days after your surgery.  Your doctor's nurse will typically make your follow-up appointment when she calls you with your pathology report.  Expect your pathology report 2-3 business days after your surgery.  You may call to check if you do not hear from [place] after three days.   OTHER INSTRUCTIONS: ______________________________________________________________________________________________ ____________________________________________________________________________________________ WHEN TO CALL YOUR DOCTOR: Fever over 101.0 Nausea and/or vomiting Extreme swelling or bruising Continued bleeding from incision. Increased pain, redness, or drainage from the incision. The clinic staff is available to answer your questions during regular business hours.  Please don't hesitate  to call and ask to speak to one of the nurses for clinical concerns.  If you have a medical emergency, go to the  nearest emergency room or call 911.  A surgeon from [facility] is always on call at the hospital. [address], [place], [place]  [address] ? [address] [address], [place], [place]   [address] [phone] ? [phone] ? FAX [phone] Web site: [email or web address]

## 2023-07-04 NOTE — Progress Notes (Signed)
Right simple mastectomy with deep right axillary sentinel lymph node mapping using mag trace, placement of right 8 [place] internal jugular Port-A-Cath  completed today. Will follow for path results.   Oncology Nurse Navigator Documentation     07/04/2023   10:00 AM  Oncology Nurse Navigator Flowsheets  Planned Course of Treatment Surgery  Phase of Treatment Surgery  Surgery Actual Start Date: 07/04/2023  Navigator Follow Up Date: 07/07/2023  Navigator Follow Up Reason: Pathology  Navigator Location [facility]  Navigator Encounter Type Appt/Treatment Plan Review  Treatment Initiated Date 07/04/2023  Patient Visit Type MedOnc  Treatment Phase Active Tx  Barriers/Navigation Needs Coordination of Care;Education  Interventions None Required  Acuity Level 2-Minimal Needs (1-2 Barriers Identified)  Support Groups/Services Friends and Family  Time Spent with Patient 15

## 2023-07-04 NOTE — Transfer of Care (Signed)
Immediate Anesthesia Transfer of Care Note  Patient: SAHVANNAH RIESER  Procedure(s) Performed: RIGHT SIMPLE MASTECTOMY SENTINEL LYMPH NODE MAPPING (Right) INSERTION PORT-A-CATH WITH ULTRASOUND GUIDANCE (Right: Neck)  Patient Location: PACU  Anesthesia Type:GA combined with regional for post-op pain  Level of Consciousness: awake, alert , and oriented  Airway & Oxygen Therapy: Patient Spontanous Breathing and Patient connected to nasal cannula oxygen  Post-op Assessment: Report given to RN and Post -op Vital signs reviewed and stable  Post vital signs: Reviewed and stable  Last Vitals:  Vitals Value Taken Time  BP 94/53 07/04/23 1008  Temp    Pulse 65 07/04/23 1010  Resp 15 07/04/23 1010  SpO2 100 % 07/04/23 1010  Vitals shown include unfiled device data.  Last Pain:  Vitals:   07/04/23 0610  PainSc: 0-No pain         Complications:  Encounter Notable Events  Notable Event Outcome Phase Comment  Difficult to intubate - expected  Intraprocedure Filed from anesthesia note documentation.

## 2023-07-04 NOTE — Discharge Summary (Signed)
Physician Discharge Summary  Patient ID: PAISLEE SZATKOWSKI MRN: [id number] DOB/AGE: [birth year] 73 y.o.  Admit date: 07/04/2023 Discharge date: 07/04/2023  Admission Diagnoses:  Discharge Diagnoses:  Principal Problem:   Breast cancer, stage 1, estrogen receptor positive, right [facility])   Discharged Condition: good  Hospital Course: Patient was discharged from PACU.  She did well.  Consults: None    Treatments: surgery: Right simple mastectomy, port placement  Discharge Exam: Blood pressure (!) 97/48, pulse 66, temperature 97.6 F (36.4 C), resp. rate 14, height 5\' 5"  (1.651 m), weight 64.4 kg, last menstrual period 11/08/2001, SpO2 98%. Incision/Wound: Incisions clean dry intact  Disposition: Discharge disposition: 01-Home or Self Care       Discharge Instructions     Diet - low sodium heart healthy   Complete by: As directed    Increase activity slowly   Complete by: As directed          Signed: Clovis Pu Callaway Hailes 07/04/2023, 4:21 PM

## 2023-07-04 NOTE — Anesthesia Procedure Notes (Addendum)
Procedure Name: Intubation Date/Time: 07/04/2023 7:50 AM  Performed by: Nadara Mustard, CRNAPre-anesthesia Checklist: Patient identified, Emergency Drugs available, Suction available and Patient being monitored Patient Re-evaluated:Patient Re-evaluated prior to induction Oxygen Delivery Method: Circle system utilized Preoxygenation: Pre-oxygenation with 100% oxygen Induction Type: IV induction Ventilation: Mask ventilation without difficulty Laryngoscope Size: Glidescope and 3 Grade View: Grade II Tube type: Oral Tube size: 7.0 mm Number of attempts: 2 Airway Equipment and Method: Stylet and Oral airway Placement Confirmation: ETT inserted through vocal cords under direct vision, positive ETCO2 and breath sounds checked- equal and bilateral Secured at: 21 cm Tube secured with: Tape Dental Injury: Teeth and Oropharynx as per pre-operative assessment  Difficulty Due To: Difficulty was anticipated, Difficult Airway- due to anterior larynx and Difficult Airway- due to reduced neck mobility Comments: TM distance 1FB, limited neck ROM. Grade 4 view (epiglottis only) with Mac 3 DL. Glidescope 3 grade 2 view, intubated with ease.

## 2023-07-04 NOTE — Anesthesia Procedure Notes (Signed)
Anesthesia Regional Block: Pectoralis block   Pre-Anesthetic Checklist: , timeout performed,  Correct Patient, Correct Site, Correct Laterality,  Correct Procedure, Correct Position, site marked,  Risks and benefits discussed,  Surgical consent,  Pre-op evaluation,  At surgeon's request and post-op pain management  Laterality: Right  Prep: chloraprep       Needles:  Injection technique: Single-shot      Needle Length: 9cm  Needle Gauge: 22     Additional Needles: Arrow StimuQuik ECHO Echogenic Stimulating PNB Needle  Procedures:,,,, ultrasound used (permanent image in chart),,    Narrative:  Start time: 07/04/2023 7:24 AM End time: 07/04/2023 7:32 AM Injection made incrementally with aspirations every 5 mL.  Performed by: Personally  Anesthesiologist: Val Eagle, MD

## 2023-07-04 NOTE — Interval H&P Note (Signed)
History and Physical Interval Note:  07/04/2023 7:19 AM  Bradley Ferris  has presented today for surgery, with the diagnosis of BREAST CANCER.  The various methods of treatment have been discussed with the patient and family. After consideration of risks, benefits and other options for treatment, the patient has consented to  Procedure(s) with comments: RIGHT SIMPLE MASTECTOMY SENTINEL LYMPH NODE MAPPING (Right) - PEC BLOCK INSERTION PORT-A-CATH WITH ULTRASOUND GUIDANCE (N/A) as a surgical intervention.  The patient's history has been reviewed, patient examined, no change in status, stable for surgery.  I have reviewed the patient's chart and labs.  Questions were answered to the patient's satisfaction.     Briannon Boggio A Finleigh Cheong

## 2023-07-05 ENCOUNTER — Encounter (HOSPITAL_COMMUNITY): Payer: Self-pay | Admitting: Surgery

## 2023-07-07 NOTE — Anesthesia Postprocedure Evaluation (Signed)
Anesthesia Post Note  Patient: Rhonda Paul  Procedure(s) Performed: RIGHT SIMPLE MASTECTOMY SENTINEL LYMPH NODE MAPPING (Right) INSERTION PORT-A-CATH WITH ULTRASOUND GUIDANCE (Right: Neck)     Patient location during evaluation: PACU Anesthesia Type: Regional and General Level of consciousness: awake and alert Pain management: pain level controlled Vital Signs Assessment: post-procedure vital signs reviewed and stable Respiratory status: spontaneous breathing, nonlabored ventilation and respiratory function stable Cardiovascular status: blood pressure returned to baseline and stable Postop Assessment: no apparent nausea or vomiting Anesthetic complications: yes   Encounter Notable Events  Notable Event Outcome Phase Comment  Difficult to intubate - expected  Intraprocedure Filed from anesthesia note documentation.               Zarah Carbon

## 2023-07-08 ENCOUNTER — Encounter: Payer: Self-pay | Admitting: *Deleted

## 2023-07-08 LAB — SURGICAL PATHOLOGY

## 2023-07-08 NOTE — Progress Notes (Unsigned)
Patient's path reviewed. Patient will need follow up with Dr Myna Hidalgo in about 3 weeks to discuss treatment. Message sent to scheduling.  Oncology Nurse Navigator Documentation     07/08/2023   11:15 AM  Oncology Nurse Navigator Flowsheets  Navigator Follow Up Date: 07/23/2023  Navigator Follow Up Reason: Follow-up Appointment  Navigator Location [facility]  Navigator Encounter Type Pathology Review  Patient Visit Type MedOnc  Treatment Phase Active Tx  Barriers/Navigation Needs Coordination of Care;Education  Interventions None Required  Acuity Level 2-Minimal Needs (1-2 Barriers Identified)  Support Groups/Services Friends and Family  Time Spent with Patient 15

## 2023-07-09 ENCOUNTER — Telehealth (HOSPITAL_BASED_OUTPATIENT_CLINIC_OR_DEPARTMENT_OTHER): Payer: Self-pay | Admitting: *Deleted

## 2023-07-09 NOTE — Telephone Encounter (Signed)
   Patient Name: Rhonda Paul  DOB: [birth year] MRN: [id number]  Primary Cardiologist: Jodelle Red, MD  Chart reviewed as part of pre-operative protocol coverage.   Patient was just cleared to undergo Mastectomy 2 weeks ago.  Given past medical history, planned low risk procedure and time since last visit, based on ACC/AHA guidelines, Rhonda Paul is at acceptable risk for the planned procedure without further cardiovascular testing.   If needed, the patient can hold Eliquis for 2 days prior to her procedure and resume post op as soon as possible. She does not need Lovenox.  I will route this recommendation to the requesting party via Epic fax function and remove from pre-op pool.  Please call with questions.  Tereso Newcomer, PA-C 07/09/2023, 10:59 AM

## 2023-07-09 NOTE — Telephone Encounter (Signed)
Notes faxed to surgeon. This phone note will be removed from the preop pool. Tereso Newcomer, PA-C  07/09/2023 11:02 AM

## 2023-07-09 NOTE — Telephone Encounter (Signed)
   Pre-operative Risk Assessment    Patient Name: Rhonda Paul  DOB: [birth year] MRN: [id number]   Date of last office visit: 03/16/2023 Date of next office visit: None   Request for Surgical Clearance    Procedure:   Colonoscopy  Date of Surgery:  Clearance 07/21/23                                 Surgeon:  Dr. Charna Elizabeth Surgeon's Group or Practice Name:  [facility]  Phone number:  [phone] Fax number:  [phone]   Type of Clearance Requested:   - Medical  - Pharmacy:  Hold Apixaban (Eliquis) Not Indicated.   Type of Anesthesia:   Propofol   Additional requests/questions:    Signed, Emmit Pomfret   07/09/2023, 10:36 AM

## 2023-07-21 ENCOUNTER — Telehealth: Payer: Self-pay | Admitting: Radiation Oncology

## 2023-07-21 NOTE — Telephone Encounter (Signed)
 2/10 @ 10:33 am Left voicemail on both patient's contact numbers for patient to call our office to be schedule for follow up consult.

## 2023-07-21 NOTE — Telephone Encounter (Signed)
 2/10 @ 12:25 pm Received call back from patient.  She stated she was told from Dr. Maria Shiner, that she can't have any treatment to that area, that radiation treatment is not an option.  She did not want to schedule any appointments right now till she speak to Dr. Maria Shiner on 2/12.  Inbasket sent to [employer] and copied Florrie Husky.

## 2023-07-22 ENCOUNTER — Other Ambulatory Visit: Payer: Self-pay | Admitting: *Deleted

## 2023-07-22 DIAGNOSIS — C50011 Malignant neoplasm of nipple and areola, right female breast: Secondary | ICD-10-CM

## 2023-07-23 ENCOUNTER — Other Ambulatory Visit: Payer: Self-pay | Admitting: *Deleted

## 2023-07-23 ENCOUNTER — Inpatient Hospital Stay: Payer: Medicare HMO | Attending: Hematology & Oncology

## 2023-07-23 ENCOUNTER — Encounter: Payer: Self-pay | Admitting: *Deleted

## 2023-07-23 ENCOUNTER — Inpatient Hospital Stay (HOSPITAL_BASED_OUTPATIENT_CLINIC_OR_DEPARTMENT_OTHER): Payer: Medicare HMO | Admitting: Hematology & Oncology

## 2023-07-23 VITALS — BP 121/69 | HR 61 | Temp 98.9°F | Resp 17 | Ht 65.0 in | Wt 142.8 lb

## 2023-07-23 DIAGNOSIS — Z170 Estrogen receptor positive status [ER+]: Secondary | ICD-10-CM | POA: Insufficient documentation

## 2023-07-23 DIAGNOSIS — C50011 Malignant neoplasm of nipple and areola, right female breast: Secondary | ICD-10-CM

## 2023-07-23 DIAGNOSIS — C50911 Malignant neoplasm of unspecified site of right female breast: Secondary | ICD-10-CM

## 2023-07-23 DIAGNOSIS — Z1731 Human epidermal growth factor receptor 2 positive status: Secondary | ICD-10-CM | POA: Insufficient documentation

## 2023-07-23 DIAGNOSIS — C50811 Malignant neoplasm of overlapping sites of right female breast: Secondary | ICD-10-CM | POA: Diagnosis not present

## 2023-07-23 LAB — CMP (CANCER CENTER ONLY)
ALT: 26 U/L (ref 0–44)
AST: 23 U/L (ref 15–41)
Albumin: 4.2 g/dL (ref 3.5–5.0)
Alkaline Phosphatase: 55 U/L (ref 38–126)
Anion gap: 7 (ref 5–15)
BUN: 14 mg/dL (ref 8–23)
CO2: 30 mmol/L (ref 22–32)
Calcium: 10.2 mg/dL (ref 8.9–10.3)
Chloride: 101 mmol/L (ref 98–111)
Creatinine: 0.85 mg/dL (ref 0.44–1.00)
GFR, Estimated: >60 mL/min (ref >60)
Glucose, Bld: 102 mg/dL — ABNORMAL HIGH (ref 70–99)
Potassium: 5.1 mmol/L (ref 3.5–5.1)
Sodium: 138 mmol/L (ref 135–145)
Total Bilirubin: 1.4 mg/dL — ABNORMAL HIGH (ref 0.0–1.2)
Total Protein: 7.3 g/dL (ref 6.5–8.1)

## 2023-07-23 LAB — CBC WITH DIFFERENTIAL (CANCER CENTER ONLY)
Abs Immature Granulocytes: 0.01 10*3/uL (ref 0.00–0.07)
Basophils Absolute: 0.1 10*3/uL (ref 0.0–0.1)
Basophils Relative: 2 %
Eosinophils Absolute: 0.3 10*3/uL (ref 0.0–0.5)
Eosinophils Relative: 5 %
HCT: 42 % (ref 36.0–46.0)
Hemoglobin: 13.9 g/dL (ref 12.0–15.0)
Immature Granulocytes: 0 %
Lymphocytes Relative: 29 %
Lymphs Abs: 1.7 10*3/uL (ref 0.7–4.0)
MCH: 33.1 pg (ref 26.0–34.0)
MCHC: 33.1 g/dL (ref 30.0–36.0)
MCV: 100 fL (ref 80.0–100.0)
Monocytes Absolute: 0.7 10*3/uL (ref 0.1–1.0)
Monocytes Relative: 12 %
Neutro Abs: 3.1 10*3/uL (ref 1.7–7.7)
Neutrophils Relative %: 52 %
Platelet Count: 233 10*3/uL (ref 150–400)
RBC: 4.2 MIL/uL (ref 3.87–5.11)
RDW: 13.4 % (ref 11.5–15.5)
WBC Count: 5.9 10*3/uL (ref 4.0–10.5)
nRBC: 0 % (ref 0.0–0.2)

## 2023-07-23 MED ORDER — ONDANSETRON HCL 8 MG PO TABS
8.0000 mg | ORAL_TABLET | Freq: Three times a day (TID) | ORAL | 1 refills | Status: DC | PRN
Start: 2023-07-23 — End: 2023-11-17

## 2023-07-23 MED ORDER — DEXAMETHASONE 4 MG PO TABS: ORAL_TABLET | ORAL | 1 refills | Status: DC

## 2023-07-23 MED ORDER — PROCHLORPERAZINE MALEATE 10 MG PO TABS: 10.0000 mg | ORAL_TABLET | Freq: Four times a day (QID) | ORAL | 1 refills | Status: DC | PRN

## 2023-07-23 NOTE — Progress Notes (Signed)
START ON PATHWAY REGIMEN - Breast     Cycle 1: A cycle is 21 days:     Trastuzumab-xxxx      Docetaxel      Carboplatin    Cycles 2 through 6: A cycle is every 21 days:     Trastuzumab-xxxx      Docetaxel      Carboplatin    Cycles 7 through 17: A cycle is every 21 days:     Trastuzumab-xxxx   **Always confirm dose/schedule in your pharmacy ordering system**  Patient Characteristics: Postoperative without Neoadjuvant Therapy, M0 (Pathologic Staging), Invasive Disease, Adjuvant Therapy, HER2 Positive, ER Positive, Node Negative, pT1c, pN0/N20mi Therapeutic Status: Postoperative without Neoadjuvant Therapy, M0 (Pathologic Staging) AJCC Grade: G2 AJCC N Category: pN0 AJCC M Category: cM0 ER Status: Positive (+) AJCC 8 Stage Grouping: IA HER2 Status: Positive (+) Oncotype Dx Recurrence Score: Not Appropriate AJCC T Category: pT1c PR Status: Negative (-) Intent of Therapy: Curative Intent, Discussed with Patient

## 2023-07-23 NOTE — Progress Notes (Unsigned)
Patient is here after her surgery. He had a port placed during her mastectomy. She will start adjuvant chemo the first week of March. Chemo ed scheduled for next week.   Oncology Nurse Navigator Documentation     07/23/2023    2:00 PM  Oncology Nurse Navigator Flowsheets  Phase of Treatment Chemotherapy  Chemotherapy Pending- Reason: Oncologist Choice  Navigator Follow Up Date: 08/12/2023  Navigator Location [facility]  Navigator Encounter Type Follow-up Appt  Patient Visit Type MedOnc  Treatment Phase Active Tx  Barriers/Navigation Needs Coordination of Care;Education  Interventions Coordination of Care  Acuity Level 2-Minimal Needs (1-2 Barriers Identified)  Coordination of Care Appts  Support Groups/Services Friends and Family  Time Spent with Patient 15  Genetic Counseling Type Non-Urgent  Genetic Counseling Date 08/11/2023

## 2023-07-23 NOTE — Progress Notes (Signed)
Hematology and Oncology Follow Up Visit  Rhonda Paul [id number] 1950/06/19 73 y.o. 07/23/2023   Principle Diagnosis:  Stage IA (T1cN0M0) invasive ductal carcinoma of the right breast -- ER+/PR-/HER2+ Remote history of stage I-triple negative-cancer of the right breast-2004  Current Therapy:   Status post right mastectomy --07/04/2023 Adjuvant Carbo/Taxotere/Herceptin/Perjeta --start cycle #1/4 on 08/11/2023     Interim History:  Rhonda Paul is back for follow-up.  This is her second office visit.  She did have her mastectomy on 07/04/2023.  Dr. Luisa Hart did a great job.  The pathology report ([id number]) showed an invasive ductal carcinoma that was 1.6 cm.  All margins were negative.  There was lymphovascular invasion.  An intramammary node was negative.  The prognostic markers were done previously.  Again she is HER2 positive and ER positive..  She is done well.  She has had no problems with recovery.  She had a drain in but this is out.  She has had no problems with nausea or vomiting.  Is been no issues with pain.  No change in bowel or bladder habits.  She has had no swelling in the right arm.  Of note, her Port-A-Cath is already put in.  She had an echocardiogram that was done on 05/2023.  This showed a left ventricular ejection fraction of 60-65%.  She has had no cough or shortness of breath.  Overall, her performance status is ECOG 0.    Medications:  Current Outpatient Medications:    apixaban (ELIQUIS) 5 MG TABS tablet, Take 1 tablet (5 mg total) by mouth 2 (two) times daily., Disp: 60 tablet, Rfl: 5   atorvastatin (LIPITOR) 40 MG tablet, TAKE ONE TABLET BY MOUTH DAILY, Disp: 90 tablet, Rfl: 3   Biotin 5000 MCG CHEW, Chew by mouth., Disp: , Rfl:    Biotin w/ Vitamins C & E (HAIR/SKIN/NAILS PO), Take 1 capsule by mouth daily., Disp: , Rfl:    dapagliflozin propanediol (FARXIGA) 10 MG TABS tablet, Take 1 tablet (10 mg total) by mouth daily before breakfast., Disp: 90  tablet, Rfl: 3   ezetimibe (ZETIA) 10 MG tablet, Take 1 tablet (10 mg total) by mouth daily., Disp: 90 tablet, Rfl: 3   ipratropium (ATROVENT) 0.03 % nasal spray, Place 1 spray into both nostrils daily., Disp: , Rfl:    levothyroxine (SYNTHROID) 50 MCG tablet, TAKE 1 TABLET BY MOUTH DAILY, Disp: 90 tablet, Rfl: 2   metoprolol succinate (TOPROL-XL) 50 MG 24 hr tablet, TAKE 1 TABLET BY MOUTH DAILY WITH OR IMMEDIATELY FOLLOWING A MEAL, Disp: 90 tablet, Rfl: 1   nitroGLYCERIN (NITROSTAT) 0.4 MG SL tablet, Place 1 tablet (0.4 mg total) under the tongue every 5 (five) minutes as needed for chest pain., Disp: 25 tablet, Rfl: 3   NON FORMULARY, Pt uses cpap nightly, Disp: , Rfl:   Allergies:  Allergies  Allergen Reactions   Lambs Quarters Hives, Itching and Nausea And Vomiting   Entresto [Sacubitril-Valsartan]     Low BP   Lanolin Acid [Lanolin] Rash   Other Itching and Rash    wool    Past Medical History, Surgical history, Social history, and Family History were reviewed and updated.  Review of Systems: Review of Systems  Constitutional: Negative.   HENT:  Negative.    Eyes: Negative.   Respiratory: Negative.    Cardiovascular: Negative.   Gastrointestinal: Negative.   Endocrine: Negative.   Genitourinary: Negative.  Negative for difficulty urinating.   Musculoskeletal: Negative.   Skin: Negative.  Neurological: Negative.   Hematological: Negative.   Psychiatric/Behavioral: Negative.      Physical Exam:  height is 5\' 5"  (1.651 m) and weight is 142 lb 12.8 oz (64.8 kg). Her oral temperature is 98.9 F (37.2 C). Her blood pressure is 121/69 and her pulse is 61. Her respiration is 17 and oxygen saturation is 100%.   Wt Readings from Last 3 Encounters:  07/23/23 142 lb 12.8 oz (64.8 kg)  07/04/23 142 lb (64.4 kg)  07/02/23 145 lb 4.8 oz (65.9 kg)    Physical Exam Vitals reviewed.  Constitutional:      Comments: Breast exam shows left breast no masses, edema or erythema.   There is no left axillary adenopathy.  Right chest wall shows will healing mastectomy.  There is no erythema or warmth.  There is no right axillary adenopathy.  HENT:     Head: Normocephalic and atraumatic.  Eyes:     Pupils: Pupils are equal, round, and reactive to light.  Cardiovascular:     Rate and Rhythm: Normal rate and regular rhythm.     Heart sounds: Normal heart sounds.  Pulmonary:     Effort: Pulmonary effort is normal.     Breath sounds: Normal breath sounds.  Abdominal:     General: Bowel sounds are normal.     Palpations: Abdomen is soft.  Musculoskeletal:        General: No tenderness or deformity. Normal range of motion.     Cervical back: Normal range of motion.  Lymphadenopathy:     Cervical: No cervical adenopathy.  Skin:    General: Skin is warm and dry.     Findings: No erythema or rash.  Neurological:     Mental Status: She is alert and oriented to person, place, and time.  Psychiatric:        Behavior: Behavior normal.        Thought Content: Thought content normal.        Judgment: Judgment normal.      Lab Results  Component Value Date   WBC 5.9 07/23/2023   HGB 13.9 07/23/2023   HCT 42.0 07/23/2023   MCV 100.0 07/23/2023   PLT 233 07/23/2023     Chemistry      Component Value Date/Time   NA 138 07/23/2023 1356   NA 141 03/24/2023 0832   NA 142 01/30/2017 0847   NA 140 02/01/2016 0841   K 5.1 07/23/2023 1356   K 4.9 (H) 01/30/2017 0847   K 4.8 02/01/2016 0841   CL 101 07/23/2023 1356   CL 104 01/30/2017 0847   CO2 30 07/23/2023 1356   CO2 32 01/30/2017 0847   CO2 27 02/01/2016 0841   BUN 14 07/23/2023 1356   BUN 9 03/24/2023 0832   BUN 11 01/30/2017 0847   BUN 12.7 02/01/2016 0841   CREATININE 0.85 07/23/2023 1356   CREATININE 1.0 01/30/2017 0847   CREATININE 0.8 02/01/2016 0841      Component Value Date/Time   CALCIUM 10.2 07/23/2023 1356   CALCIUM 9.6 01/30/2017 0847   CALCIUM 10.0 02/01/2016 0841   ALKPHOS 55 07/23/2023  1356   ALKPHOS 51 01/30/2017 0847   ALKPHOS 59 02/01/2016 0841   AST 23 07/23/2023 1356   AST 22 02/01/2016 0841   ALT 26 07/23/2023 1356   ALT 25 01/30/2017 0847   ALT 25 02/01/2016 0841   BILITOT 1.4 (H) 07/23/2023 1356   BILITOT 0.93 02/01/2016 0841      Impression  and Plan: Rhonda Paul is a very nice 73 year old white female.  She had a remote history of early-stage breast cancer of the right breast.  She had adjuvant chemotherapy for that.  Now, she has a new breast cancer in the right breast.  This cancer is HER2 positive.  We will subsequently go ahead and treat her with anti-HER2 therapy with the chemotherapy in the adjuvant setting.  She has stage I disease.  Thankfully, she had a negative lymph node.  Think she would do quite well with carboplatinum/Taxotere.  We will use Herceptin/Perjeta along with this.  She already has a Port-A-Cath in.  Will go ahead and do the chemotherapy teaching next week..  I went over some the side effects of chemotherapy.  She probably will need to have antibiotics.  I would probably schedule her to have Levaquin as a prophylactic antibiotic.  I really believe that with adjuvant chemotherapy along with anti-HER2 therapy, we can decrease the risk of recurrence down to less than 10%.  4 cycles of treatment would be appropriate in my opinion.  After that, I am I can get her on Kadcyla as the adjuvant for a total of 1 year.  She is in agreement with this.  We will go ahead and get started the first week in March.  I know that she will do quite well.  She is incredibly tough.   Josph Macho, MD 2/12/20253:34 PM

## 2023-07-24 ENCOUNTER — Other Ambulatory Visit: Payer: Self-pay

## 2023-07-24 ENCOUNTER — Encounter: Payer: Self-pay | Admitting: Hematology & Oncology

## 2023-07-29 ENCOUNTER — Encounter: Payer: Self-pay | Admitting: *Deleted

## 2023-07-29 ENCOUNTER — Inpatient Hospital Stay: Payer: Medicare HMO

## 2023-07-29 NOTE — Progress Notes (Signed)
Patient in chemotherapy education class with self.  Discussed side effects of Taxotere, Carboplatin, Kanjinto which include but are not limited to myelosuppression, decreased appetite, fatigue, fever, allergic or infusional reaction, mucositis, cardiac toxicity, cough, SOB, altered taste, nausea and vomiting, diarrhea, constipation, elevated LFTs myalgia and arthralgias, hair loss or thinning, rash, skin dryness, nail changes, peripheral neuropathy, discolored urine, delayed wound healing, mental changes (Chemo brain), increased risk of infections, weight loss.  Reviewed infusion room and office policy and procedure and phone numbers 24 hours x 7 days a week.  Reviewed when to call the office with any concerns or problems.  Transport planner given.  Discussed portacath insertion and EMLA cream administration.  Antiemetic protocol and chemotherapy schedule reviewed. Patient verbalized understanding of chemotherapy indications and possible side effects.  Teachback done3

## 2023-08-03 NOTE — Therapy (Signed)
 OUTPATIENT PHYSICAL THERAPY BREAST CANCER POST OP FOLLOW UP   Patient Name: Rhonda Paul MRN: [id number] DOB:June 19, 1950, 73 y.o., female Today's Date: 08/04/2023  END OF SESSION:  PT End of Session - 08/04/23 1144     Visit Number 2    Number of Visits 2    Date for PT Re-Evaluation 08/06/23    PT Start Time 1104    PT Stop Time 1140    PT Time Calculation (min) 36 min    Activity Tolerance Patient tolerated treatment well    Behavior During Therapy [facility] for tasks assessed/performed             Past Medical History:  Diagnosis Date   Adenocarcinoma of breast (HCC) 11/2001   RIGHT   Arthritis    CHF (congestive heart failure) (HCC)    Chronic anticoagulation    Eliquis   Coronary artery disease    Nonobstructive   Diverticulitis 2009   diverticulitis   Dysrhythmia    A. Fib   Heart attack (HCC)    One week after stroke   Heart murmur    HOH (hard of hearing)    right ear; wears hearing aid   Hypothyroidism    Infertility, female 02/2000   4 SAB   Metrorrhagia    Migraine 02/2000   Permanent atrial fibrillation (HCC) 04/01/2014   Personal history of chemotherapy 2003   Personal history of radiation therapy 2003   Sleep apnea    STD (sexually transmitted disease) 1976   Hx of HSV   Stroke (HCC) 01/03/2015   s/p TPA R-MCA   Vitamin D deficiency 07/2006   Past Surgical History:  Procedure Laterality Date   APPENDECTOMY  1959   BREAST BIOPSY Right 05/19/2023   [place] RT BREAST BX W LOC DEV 1ST LESION IMG BX SPEC US GUIDE 05/19/2023 [facility]MO[facility]AP[facility]   BREAST SURGERY Right 11/2001   lumpectomy with sentinel node biopsy X 3 all negative, ER/PR negative , chemo 6 doses, and radiation daily X 6 weeks   BUBBLE STUDY  04/18/2022   Procedure: BUBBLE STUDY;  Surgeon: Christell Constant, MD;  Location: [facility];  Service: Cardiovascular;;   CARDIAC CATHETERIZATION N/A 01/16/2015   Procedure: Left Heart Cath and Coronary Angiography;  Surgeon: Lyn Records, MD; OM1 75%, RCA 30%, EF normal; spasm in RCA w/ catheter engagement    COLONOSCOPY  11/2012   diverticulitis, 2 polyps negative, repeat in 5 years   HALLUX FUSION Left 03/03/2017   Procedure: HALLUX RIGIDUS CORRECTION WITH COLECTOMY DEBRIDEMENT AND CAPSULAR RELEASE OF THE FIRST MPJ WITH IMPLANT LEFT FOOT;  Surgeon: Larey Dresser, DPM;  Location: [facility];  Service: Podiatry;  Laterality: Left;   JOINT REPLACEMENT Left 02/2017   big toe   MASTECTOMY W/ SENTINEL NODE BIOPSY Right 07/04/2023   Procedure: RIGHT SIMPLE MASTECTOMY SENTINEL LYMPH NODE MAPPING;  Surgeon: Harriette Bouillon, MD;  Location: [facility];  Service: General;  Laterality: Right;  PEC BLOCK   PORT-A-CATH REMOVAL  2004   PORTA CATH INSERTION  2003   PORTACATH PLACEMENT Right 07/04/2023   Procedure: INSERTION PORT-A-CATH WITH ULTRASOUND GUIDANCE;  Surgeon: Harriette Bouillon, MD;  Location: [facility];  Service: General;  Laterality: Right;   RADIOLOGY WITH ANESTHESIA N/A 01/03/2015   Procedure: RADIOLOGY WITH ANESTHESIA;  Surgeon: Medication Radiologist, MD;  Location: [facility];  Service: Radiology;  Laterality: N/A;   TEE WITHOUT CARDIOVERSION N/A 04/18/2022   Procedure: TRANSESOPHAGEAL ECHOCARDIOGRAM (TEE);  Surgeon: Christell Constant,  MD;  Location: [facility];  Service: Cardiovascular;  Laterality: N/A;   Patient Active Problem List   Diagnosis Date Noted   Breast cancer, stage 1, estrogen receptor positive, right (HCC) 07/04/2023   CAD in native artery 07/28/2016   Chronic anticoagulation     NSTEMI- med rx, culprit lesion 75% OM1 01/14/2015   Atelectasis    Acute respiratory failure with hypoxemia (HCC)    Cerebral infarction (HCC) 01/03/2015   Permanent atrial fibrillation (HCC) 04/01/2014   Moderate mitral regurgitation 04/01/2014   Breast cancer (HCC) 04/01/2014   Hyperlipidemia 04/01/2014    PCP: Sigmund Hazel, MD  REFERRING PROVIDER: Harriette Bouillon, MD  REFERRING DIAG: Rt breast  cancer  THERAPY DIAG:  Malignant neoplasm of nipple of right breast in female, unspecified estrogen receptor status (HCC)  Abnormal posture  At risk for lymphedema  Rationale for Evaluation and Treatment: Rehabilitation  ONSET DATE: 05/20/23  SUBJECTIVE:                                                                                                                                                                                           SUBJECTIVE STATEMENT: I did really well.  I never really had any pain.  I am doing my exercises 2x per day.  I started massage with aquaphor.    PERTINENT HISTORY:  Patient was diagnosed with right grade 2 IDC. It measures 1.3 cm and is located in the upper inner quadrant. It is ER pos, PR neg, HER2pos with a Ki67 of 15%. Hx of Rt breast cancer 20 years ago with lumpectomy and radiation and chemotherapy. Rt mastectomy  on 07/04/23 with 1 negative node removed. Will start chemo on 08/11/23.  Moderate mitral valve regurgitation and Afib (4 total with the previous lumpectomy)   PATIENT GOALS:  Reassess how my recovery is going related to arm function, pain, and swelling.  PAIN:  Are you having pain? No  PRECAUTIONS: Recent Surgery, right UE Lymphedema risk  RED FLAGS: None   ACTIVITY LEVEL / LEISURE: I haven't been lifting more than 10# but that's all.  I think I could     OBJECTIVE:   PATIENT SURVEYS:  QUICK DASH: 0%   OBSERVATIONS: Well healed incision, wearing compression 3 very small cords noted in axilla but not limiting or pulling per pt - education on cording and how this could be aggravated with overuse but should not get any worse than it is today.   POSTURE:  Rounded shoulders   LYMPHEDEMA ASSESSMENT:   UPPER EXTREMITY AROM/PROM:   A/PROM RIGHT   eval   08/04/23  Shoulder extension 65 60  Shoulder  flexion 150 145  Shoulder abduction 170 165  Shoulder internal rotation 70 70  Shoulder external rotation 90 90                           (Blank rows = not tested)   A/PROM LEFT   eval 08/04/23  Shoulder extension 55   Shoulder flexion 145   Shoulder abduction 165   Shoulder internal rotation 70   Shoulder external rotation 75                           (Blank rows = not tested)   CERVICAL AROM: All within normal limits:    LYMPHEDEMA ASSESSMENTS (in cm):    LANDMARK RIGHT   eval 08/04/23  10 cm proximal to olecranon process 25.6 25  Olecranon process 24.3 24.4  10 cm proximal to ulnar styloid process 17.7 18  Just proximal to ulnar styloid process 15.1 15  Across hand at thumb web space 19.3 19.3  At base of 2nd digit 6.8 6.8  (Blank rows = not tested)   LANDMARK LEFT   eval  10 cm proximal to olecranon process 26  Olecranon process 24.3  10 cm proximal to ulnar styloid process 17.7  Just proximal to ulnar styloid process 15.1  Across hand at thumb web space 19.4  At base of 2nd digit 6.8  (Blank rows = not tested)   PATIENT EDUCATION:  Education details: Per post-op instruction section below: including scar massage, advancement of exercises to supine flexion and chest stretch, lymphedema surveillance and review, use of compression bras, and reasons to return to PT Person educated: Patient  Education method: Facilities manager, handouts, verbal cues Education comprehension: Verbalized understanding   HOME EXERCISE PROGRAM: Reviewed previously given post op HEP.   ASSESSMENT:  CLINICAL IMPRESSION: Pt is doing very well after Rt mastectomy.  She has no significant swelling at the chest wall, has returned to baseline activity status and is lacking only 5deg of flexion and abduction.  She does have 3-4 small cords in the axilla but they are not limiting or pulling.  She was educated about her lymphedema risk and to return if the cording gets worse or starts to be noticeable.    Pt will benefit from skilled therapeutic intervention to improve on the following deficits: Decreased knowledge of  precautions, impaired UE functional use, pain, decreased ROM, postural dysfunction.   PT treatment/interventions: ADL/Self care home management, [phone]- Self Care   GOALS: Goals reviewed with patient? Yes  LONG TERM GOALS:  (STG=LTG)  GOALS Name Target Date  Goal status  1 Pt will demonstrate she has regained full shoulder ROM and function post operatively compared to baselines.  Baseline: 08/04/23 MET                    PLAN:  PT FREQUENCY/DURATION: as needed  PLAN FOR NEXT SESSION: as needed    [facility]  [address], [address]  [place] [address]  [phone]  After Breast Cancer Class Video It is recommended you view the ABC class video to be educated on lymphedema risk reduction. This video lasts for about 30 minutes. It can be viewed on our website here: [email or web address]  Scar massage You can begin gentle scar massage to you incision sites. Gently place one hand on the incision and move the skin (without sliding on the skin) in various directions. Do  this for a few minutes and then you can gently massage either coconut oil or vitamin E cream into the scars.  Compression garment You should continue wearing your compression bra until you feel like you no longer have swelling.  Home exercise Program Continue doing the exercises you were given until you feel like you can do them without feeling any tightness at the end.   Walking Program Studies show that 30 minutes of walking per day (fast enough to elevate your heart rate) can significantly reduce the risk of a cancer recurrence. If you can't walk due to other medical reasons, we encourage you to find another activity you could do (like a stationary bike or water exercise).  Posture After breast cancer surgery, people frequently sit with rounded shoulders posture because it puts their incisions on slack and feels  better. If you sit like this and scar tissue forms in that position, you can become very tight and have pain sitting or standing with good posture. Try to be aware of your posture and sit and stand up tall to heal properly.  Follow up PT: It is recommended you return every 3 months for the first 2 years following surgery to be assessed on the SOZO machine for an L-Dex score. This helps prevent clinically significant lymphedema in 95% of patients. These follow up screens are 10 minute appointments that you are not billed for.  Idamae Lusher, PT 08/04/2023, 11:45 AM

## 2023-08-04 ENCOUNTER — Encounter: Payer: Self-pay | Admitting: Rehabilitation

## 2023-08-04 ENCOUNTER — Encounter: Payer: Self-pay | Admitting: Hematology & Oncology

## 2023-08-04 ENCOUNTER — Ambulatory Visit: Payer: Medicare HMO | Attending: Surgery | Admitting: Rehabilitation

## 2023-08-04 DIAGNOSIS — R293 Abnormal posture: Secondary | ICD-10-CM | POA: Diagnosis present

## 2023-08-04 DIAGNOSIS — Z9189 Other specified personal risk factors, not elsewhere classified: Secondary | ICD-10-CM

## 2023-08-04 DIAGNOSIS — C50011 Malignant neoplasm of nipple and areola, right female breast: Secondary | ICD-10-CM

## 2023-08-06 ENCOUNTER — Ambulatory Visit: Payer: Self-pay | Admitting: Rehabilitation

## 2023-08-06 DIAGNOSIS — Z860100 Personal history of colon polyps, unspecified: Secondary | ICD-10-CM | POA: Diagnosis not present

## 2023-08-06 DIAGNOSIS — Z1211 Encounter for screening for malignant neoplasm of colon: Secondary | ICD-10-CM | POA: Diagnosis not present

## 2023-08-06 DIAGNOSIS — K5730 Diverticulosis of large intestine without perforation or abscess without bleeding: Secondary | ICD-10-CM | POA: Diagnosis not present

## 2023-08-06 DIAGNOSIS — Z860101 Personal history of adenomatous and serrated colon polyps: Secondary | ICD-10-CM | POA: Diagnosis not present

## 2023-08-07 ENCOUNTER — Encounter: Payer: Self-pay | Admitting: Hematology & Oncology

## 2023-08-07 ENCOUNTER — Telehealth: Payer: Self-pay | Admitting: Genetic Counselor

## 2023-08-07 NOTE — Telephone Encounter (Signed)
 Rescheduled appointments per patient request. Patient is aware of the updated details.

## 2023-08-11 ENCOUNTER — Encounter: Payer: Medicare HMO | Admitting: Genetic Counselor

## 2023-08-11 ENCOUNTER — Other Ambulatory Visit: Payer: Self-pay | Admitting: *Deleted

## 2023-08-11 ENCOUNTER — Other Ambulatory Visit: Payer: Medicare HMO

## 2023-08-11 MED ORDER — LIDOCAINE-PRILOCAINE 2.5-2.5 % EX CREA: 1.0000 | TOPICAL_CREAM | CUTANEOUS | 0 refills | Status: AC | PRN

## 2023-08-12 ENCOUNTER — Inpatient Hospital Stay (HOSPITAL_BASED_OUTPATIENT_CLINIC_OR_DEPARTMENT_OTHER): Payer: Medicare HMO | Admitting: Hematology & Oncology

## 2023-08-12 ENCOUNTER — Inpatient Hospital Stay: Payer: Medicare HMO

## 2023-08-12 ENCOUNTER — Other Ambulatory Visit: Payer: Self-pay

## 2023-08-12 ENCOUNTER — Encounter: Payer: Self-pay | Admitting: Hematology & Oncology

## 2023-08-12 ENCOUNTER — Inpatient Hospital Stay: Payer: Medicare HMO | Attending: Hematology & Oncology

## 2023-08-12 ENCOUNTER — Encounter: Payer: Self-pay | Admitting: *Deleted

## 2023-08-12 VITALS — BP 110/67 | HR 67 | Temp 98.1°F | Resp 17 | Ht 65.0 in | Wt 145.0 lb

## 2023-08-12 VITALS — BP 108/61 | HR 67 | Temp 97.9°F | Resp 18

## 2023-08-12 DIAGNOSIS — C50911 Malignant neoplasm of unspecified site of right female breast: Secondary | ICD-10-CM

## 2023-08-12 DIAGNOSIS — Z1731 Human epidermal growth factor receptor 2 positive status: Secondary | ICD-10-CM | POA: Diagnosis not present

## 2023-08-12 DIAGNOSIS — Z5189 Encounter for other specified aftercare: Secondary | ICD-10-CM | POA: Diagnosis not present

## 2023-08-12 DIAGNOSIS — Z79620 Long term (current) use of immunosuppressive biologic: Secondary | ICD-10-CM | POA: Insufficient documentation

## 2023-08-12 DIAGNOSIS — C50811 Malignant neoplasm of overlapping sites of right female breast: Secondary | ICD-10-CM | POA: Insufficient documentation

## 2023-08-12 DIAGNOSIS — Z170 Estrogen receptor positive status [ER+]: Secondary | ICD-10-CM

## 2023-08-12 DIAGNOSIS — Z5111 Encounter for antineoplastic chemotherapy: Secondary | ICD-10-CM | POA: Diagnosis present

## 2023-08-12 DIAGNOSIS — Z5112 Encounter for antineoplastic immunotherapy: Secondary | ICD-10-CM | POA: Insufficient documentation

## 2023-08-12 LAB — CBC WITH DIFFERENTIAL (CANCER CENTER ONLY)
Abs Immature Granulocytes: 0.08 10*3/uL — ABNORMAL HIGH (ref 0.00–0.07)
Basophils Absolute: 0 10*3/uL (ref 0.0–0.1)
Basophils Relative: 0 %
Eosinophils Absolute: 0 10*3/uL (ref 0.0–0.5)
Eosinophils Relative: 0 %
HCT: 38.7 % (ref 36.0–46.0)
Hemoglobin: 13.3 g/dL (ref 12.0–15.0)
Immature Granulocytes: 1 %
Lymphocytes Relative: 6 %
Lymphs Abs: 0.9 10*3/uL (ref 0.7–4.0)
MCH: 33.8 pg (ref 26.0–34.0)
MCHC: 34.4 g/dL (ref 30.0–36.0)
MCV: 98.2 fL (ref 80.0–100.0)
Monocytes Absolute: 0.4 10*3/uL (ref 0.1–1.0)
Monocytes Relative: 3 %
Neutro Abs: 13.1 10*3/uL — ABNORMAL HIGH (ref 1.7–7.7)
Neutrophils Relative %: 90 %
Platelet Count: 210 10*3/uL (ref 150–400)
RBC: 3.94 MIL/uL (ref 3.87–5.11)
RDW: 13.2 % (ref 11.5–15.5)
WBC Count: 14.5 10*3/uL — ABNORMAL HIGH (ref 4.0–10.5)
nRBC: 0 % (ref 0.0–0.2)

## 2023-08-12 LAB — CMP (CANCER CENTER ONLY)
ALT: 20 U/L (ref 0–44)
AST: 20 U/L (ref 15–41)
Albumin: 4.4 g/dL (ref 3.5–5.0)
Alkaline Phosphatase: 58 U/L (ref 38–126)
Anion gap: 12 (ref 5–15)
BUN: 16 mg/dL (ref 8–23)
CO2: 23 mmol/L (ref 22–32)
Calcium: 9.8 mg/dL (ref 8.9–10.3)
Chloride: 99 mmol/L (ref 98–111)
Creatinine: 0.92 mg/dL (ref 0.44–1.00)
GFR, Estimated: >60 mL/min (ref >60)
Glucose, Bld: 216 mg/dL — ABNORMAL HIGH (ref 70–99)
Potassium: 4.4 mmol/L (ref 3.5–5.1)
Sodium: 134 mmol/L — ABNORMAL LOW (ref 135–145)
Total Bilirubin: 1 mg/dL (ref 0.0–1.2)
Total Protein: 7.6 g/dL (ref 6.5–8.1)

## 2023-08-12 MED ORDER — DEXAMETHASONE SODIUM PHOSPHATE 10 MG/ML IJ SOLN
10.0000 mg | Freq: Once | INTRAMUSCULAR | Status: AC
  Administered 2023-08-12: 10 mg via INTRAVENOUS
  Filled 2023-08-12: qty 1

## 2023-08-12 MED ORDER — PALONOSETRON HCL INJECTION 0.25 MG/5ML
0.2500 mg | Freq: Once | INTRAVENOUS | Status: AC
  Administered 2023-08-12: 0.25 mg via INTRAVENOUS
  Filled 2023-08-12: qty 5

## 2023-08-12 MED ORDER — ACETAMINOPHEN 325 MG PO TABS
650.0000 mg | ORAL_TABLET | Freq: Once | ORAL | Status: AC
  Administered 2023-08-12: 650 mg via ORAL
  Filled 2023-08-12: qty 2

## 2023-08-12 MED ORDER — DIPHENHYDRAMINE HCL 25 MG PO CAPS
50.0000 mg | ORAL_CAPSULE | Freq: Once | ORAL | Status: AC
  Administered 2023-08-12: 50 mg via ORAL
  Filled 2023-08-12: qty 2

## 2023-08-12 MED ORDER — SODIUM CHLORIDE 0.9 % IV SOLN
150.0000 mg | Freq: Once | INTRAVENOUS | Status: AC
  Administered 2023-08-12: 150 mg via INTRAVENOUS
  Filled 2023-08-12: qty 150

## 2023-08-12 MED ORDER — HEPARIN SOD (PORK) LOCK FLUSH 100 UNIT/ML IV SOLN
500.0000 [IU] | Freq: Once | INTRAVENOUS | Status: AC | PRN
  Administered 2023-08-12: 500 [IU]

## 2023-08-12 MED ORDER — SODIUM CHLORIDE 0.9% FLUSH
10.0000 mL | INTRAVENOUS | Status: DC | PRN
  Administered 2023-08-12: 10 mL

## 2023-08-12 MED ORDER — SODIUM CHLORIDE 0.9 % IV SOLN
466.8000 mg | Freq: Once | INTRAVENOUS | Status: AC
  Administered 2023-08-12: 470 mg via INTRAVENOUS
  Filled 2023-08-12: qty 47

## 2023-08-12 MED ORDER — SODIUM CHLORIDE 0.9 % IV SOLN
75.0000 mg/m2 | Freq: Once | INTRAVENOUS | Status: AC
  Administered 2023-08-12: 129 mg via INTRAVENOUS
  Filled 2023-08-12: qty 12.9

## 2023-08-12 MED ORDER — TRASTUZUMAB-ANNS CHEMO 150 MG IV SOLR
8.0000 mg/kg | Freq: Once | INTRAVENOUS | Status: AC
  Administered 2023-08-12: 525 mg via INTRAVENOUS
  Filled 2023-08-12: qty 25

## 2023-08-12 MED ORDER — SODIUM CHLORIDE 0.9 % IV SOLN
INTRAVENOUS | Status: DC
Start: 2023-08-12 — End: 2023-08-12

## 2023-08-12 NOTE — Progress Notes (Signed)
 Patient here to begin systemic treatment. She has complete all required staging and pretreatment testing. Her education was completed last month.   Oncology Nurse Navigator Documentation     08/12/2023    9:45 AM  Oncology Nurse Navigator Flowsheets  Phase of Treatment Chemotherapy  Chemotherapy Actual Start Date: 08/12/2023  Chemotherapy Expected End Date: 10/14/2023  Navigator Follow Up Date: 09/02/2023  Navigator Follow Up Reason: Follow-up Appointment;Chemotherapy  Navigator Radiographer, therapeutic Encounter Type Treatment  Patient Visit Type MedOnc  Treatment Phase Active Tx;First Chemo Tx  Barriers/Navigation Needs Coordination of Care;Education  Interventions Psycho-Social Support  Acuity Level 2-Minimal Needs (1-2 Barriers Identified)  Support Groups/Services Friends and Family  Time Spent with Patient 15

## 2023-08-12 NOTE — Patient Instructions (Signed)
 [facility] - A DEPT OF [facility]  Discharge Instructions: Thank you for choosing [facility] to provide your oncology and hematology care.   If you have a lab appointment with the Cancer Center, please go directly to the Cancer Center and check in at the registration area.  Wear comfortable clothing and clothing appropriate for easy access to any Portacath or PICC line.   We strive to give you quality time with your provider. You may need to reschedule your appointment if you arrive late (15 or more minutes).  Arriving late affects you and other patients whose appointments are after yours.  Also, if you miss three or more appointments without notifying the office, you may be dismissed from the clinic at the provider's discretion.      For prescription refill requests, have your pharmacy contact our office and allow 72 hours for refills to be completed.    Today you received the following chemotherapy and/or immunotherapy agents Taxotere, Carboplatin, Kanjinti      To help prevent nausea and vomiting after your treatment, we encourage you to take your nausea medication as directed.  BELOW ARE SYMPTOMS THAT SHOULD BE REPORTED IMMEDIATELY: *FEVER GREATER THAN 100.4 F (38 C) OR HIGHER *CHILLS OR SWEATING *NAUSEA AND VOMITING THAT IS NOT CONTROLLED WITH YOUR NAUSEA MEDICATION *UNUSUAL SHORTNESS OF BREATH *UNUSUAL BRUISING OR BLEEDING *URINARY PROBLEMS (pain or burning when urinating, or frequent urination) *BOWEL PROBLEMS (unusual diarrhea, constipation, pain near the anus) TENDERNESS IN MOUTH AND THROAT WITH OR WITHOUT PRESENCE OF ULCERS (sore throat, sores in mouth, or a toothache) UNUSUAL RASH, SWELLING OR PAIN  UNUSUAL VAGINAL DISCHARGE OR ITCHING   Items with * indicate a potential emergency and should be followed up as soon as possible or go to the Emergency Department if any problems should occur.  Please show the CHEMOTHERAPY ALERT  CARD or IMMUNOTHERAPY ALERT CARD at check-in to the Emergency Department and triage nurse. Should you have questions after your visit or need to cancel or reschedule your appointment, please contact [facility] - A DEPT OF [facility]  [phone] and follow the prompts.  Office hours are 8:00 a.m. to 4:30 p.m. Monday - Friday. Please note that voicemails left after 4:00 p.m. may not be returned until the following business day.  We are closed weekends and major holidays. You have access to a nurse at all times for urgent questions. Please call the main number to the clinic [phone] and follow the prompts.  For any non-urgent questions, you may also contact your provider using MyChart. We now offer e-Visits for anyone 73 and older to request care online for non-urgent symptoms. For details visit [email or web address].   Also download the MyChart app! Go to the app store, search "MyChart", open the app, select [place], and log in with your MyChart username and password.

## 2023-08-12 NOTE — Patient Instructions (Signed)

## 2023-08-12 NOTE — Progress Notes (Signed)
 Hematology and Oncology Follow Up Visit  Rhonda Paul [id number] [birth year] 73 y.o. 08/12/2023   Principle Diagnosis:  Stage IA (T1cN0M0) invasive ductal carcinoma of the right breast -- ER+/PR-/HER2+ Remote history of stage I-triple negative-cancer of the right breast-2004  Current Therapy:   Status post right mastectomy --07/04/2023 Adjuvant Carbo/Taxotere/Herceptin/ --start cycle #1/4 on 08/11/2023     Interim History:  Rhonda Paul is back for follow-up.  This is her second office visit.  She did have her mastectomy on 07/04/2023.  Dr. Luisa Hart did a great job.  The pathology report ([id number]) showed an invasive ductal carcinoma that was 1.6 cm.  All margins were negative.  There was lymphovascular invasion.  An intramammary node was negative.  The prognostic markers were done previously.  Again she is HER2 positive and ER positive..  She is done well.  She has had no problems with recovery.  She had a drain in but this is out.  She has had no problems with nausea or vomiting.  Is been no issues with pain.  No change in bowel or bladder habits.  She has had no swelling in the right arm.  Of note, her Port-A-Cath is already put in.  She had an echocardiogram that was done on 05/2023.  This showed a left ventricular ejection fraction of 60-65%.  She has had no cough or shortness of breath.  Overall, her performance status is ECOG 0.    Medications:  Current Outpatient Medications:    apixaban (ELIQUIS) 5 MG TABS tablet, Take 1 tablet (5 mg total) by mouth 2 (two) times daily., Disp: 60 tablet, Rfl: 5   atorvastatin (LIPITOR) 40 MG tablet, TAKE ONE TABLET BY MOUTH DAILY, Disp: 90 tablet, Rfl: 3   Biotin 5000 MCG CHEW, Chew by mouth., Disp: , Rfl:    Biotin w/ Vitamins C & E (HAIR/SKIN/NAILS PO), Take 1 capsule by mouth daily., Disp: , Rfl:    dapagliflozin propanediol (FARXIGA) 10 MG TABS tablet, Take 1 tablet (10 mg total) by mouth daily before breakfast., Disp: 90 tablet, Rfl:  3   dexamethasone (DECADRON) 4 MG tablet, Take 2 tabs by mouth 2 times daily starting day before chemo. Then take 2 tabs daily for 2 days starting day after chemo. Take with food., Disp: 30 tablet, Rfl: 1   ezetimibe (ZETIA) 10 MG tablet, Take 1 tablet (10 mg total) by mouth daily., Disp: 90 tablet, Rfl: 3   ipratropium (ATROVENT) 0.03 % nasal spray, Place 1 spray into both nostrils daily., Disp: , Rfl:    levothyroxine (SYNTHROID) 50 MCG tablet, TAKE 1 TABLET BY MOUTH DAILY, Disp: 90 tablet, Rfl: 2   lidocaine-prilocaine (EMLA) cream, Apply 1 Application topically as needed. Apply one hour prior to port access and cover with plastic wrap, Disp: 30 g, Rfl: 0   metoprolol succinate (TOPROL-XL) 50 MG 24 hr tablet, TAKE 1 TABLET BY MOUTH DAILY WITH OR IMMEDIATELY FOLLOWING A MEAL, Disp: 90 tablet, Rfl: 1   nitroGLYCERIN (NITROSTAT) 0.4 MG SL tablet, Place 1 tablet (0.4 mg total) under the tongue every 5 (five) minutes as needed for chest pain., Disp: 25 tablet, Rfl: 3   NON FORMULARY, Pt uses cpap nightly, Disp: , Rfl:    ondansetron (ZOFRAN) 8 MG tablet, Take 1 tablet (8 mg total) by mouth every 8 (eight) hours as needed for nausea or vomiting. Start on the third day after chemotherapy., Disp: 30 tablet, Rfl: 1   prochlorperazine (COMPAZINE) 10 MG tablet, Take 1 tablet (10 mg  total) by mouth every 6 (six) hours as needed for nausea or vomiting., Disp: 30 tablet, Rfl: 1  Allergies:  Allergies  Allergen Reactions   Lambs Quarters Hives, Itching and Nausea And Vomiting   Entresto [Sacubitril-Valsartan]     Low BP   Lanolin Acid [Lanolin] Rash   Other Itching and Rash    wool    Past Medical History, Surgical history, Social history, and Family History were reviewed and updated.  Review of Systems: Review of Systems  Constitutional: Negative.   HENT:  Negative.    Eyes: Negative.   Respiratory: Negative.    Cardiovascular: Negative.   Gastrointestinal: Negative.   Endocrine: Negative.    Genitourinary: Negative.  Negative for difficulty urinating.   Musculoskeletal: Negative.   Skin: Negative.   Neurological: Negative.   Hematological: Negative.   Psychiatric/Behavioral: Negative.      Physical Exam:  height is 5\' 5"  (1.651 m) and weight is 145 lb (65.8 kg). Her oral temperature is 98.1 F (36.7 C). Her blood pressure is 110/67 and her pulse is 67. Her respiration is 17 and oxygen saturation is 100%.   Wt Readings from Last 3 Encounters:  08/12/23 145 lb (65.8 kg)  07/23/23 142 lb 12.8 oz (64.8 kg)  07/04/23 142 lb (64.4 kg)    Physical Exam Vitals reviewed.  Constitutional:      Comments: Breast exam shows left breast no masses, edema or erythema.  There is no left axillary adenopathy.  Right chest wall shows will healing mastectomy.  There is no erythema or warmth.  There is no right axillary adenopathy.  HENT:     Head: Normocephalic and atraumatic.  Eyes:     Pupils: Pupils are equal, round, and reactive to light.  Cardiovascular:     Rate and Rhythm: Normal rate and regular rhythm.     Heart sounds: Normal heart sounds.  Pulmonary:     Effort: Pulmonary effort is normal.     Breath sounds: Normal breath sounds.  Abdominal:     General: Bowel sounds are normal.     Palpations: Abdomen is soft.  Musculoskeletal:        General: No tenderness or deformity. Normal range of motion.     Cervical back: Normal range of motion.  Lymphadenopathy:     Cervical: No cervical adenopathy.  Skin:    General: Skin is warm and dry.     Findings: No erythema or rash.  Neurological:     Mental Status: She is alert and oriented to person, place, and time.  Psychiatric:        Behavior: Behavior normal.        Thought Content: Thought content normal.        Judgment: Judgment normal.     Lab Results  Component Value Date   WBC 14.5 (H) 08/12/2023   HGB 13.3 08/12/2023   HCT 38.7 08/12/2023   MCV 98.2 08/12/2023   PLT 210 08/12/2023     Chemistry       Component Value Date/Time   NA 134 (L) 08/12/2023 0910   NA 141 03/24/2023 0832   NA 142 01/30/2017 0847   NA 140 02/01/2016 0841   K 4.4 08/12/2023 0910   K 4.9 (H) 01/30/2017 0847   K 4.8 02/01/2016 0841   CL 99 08/12/2023 0910   CL 104 01/30/2017 0847   CO2 23 08/12/2023 0910   CO2 32 01/30/2017 0847   CO2 27 02/01/2016 0841   BUN 16  08/12/2023 0910   BUN 9 03/24/2023 0832   BUN 11 01/30/2017 0847   BUN 12.7 02/01/2016 0841   CREATININE 0.92 08/12/2023 0910   CREATININE 1.0 01/30/2017 0847   CREATININE 0.8 02/01/2016 0841      Component Value Date/Time   CALCIUM 9.8 08/12/2023 0910   CALCIUM 9.6 01/30/2017 0847   CALCIUM 10.0 02/01/2016 0841   ALKPHOS 58 08/12/2023 0910   ALKPHOS 51 01/30/2017 0847   ALKPHOS 59 02/01/2016 0841   AST 20 08/12/2023 0910   AST 22 02/01/2016 0841   ALT 20 08/12/2023 0910   ALT 25 01/30/2017 0847   ALT 25 02/01/2016 0841   BILITOT 1.0 08/12/2023 0910   BILITOT 0.93 02/01/2016 0841      Impression and Plan: Ms. Kendricks is a very nice 73 year old white female.  She had a remote history of early-stage breast cancer of the right breast.  She had adjuvant chemotherapy for that.  Now, she has a new breast cancer in the right breast.  This cancer is HER2 positive.  We will subsequently go ahead and treat her with anti-HER2 therapy with the chemotherapy in the adjuvant setting.  She has stage I disease.  Thankfully, she had a negative lymph node.  We will go ahead with adjuvant chemotherapy today.  We will utilize Carboplatinum/Taxotere/Herceptin.  I will plan to get her back to see [place] in 3 weeks for her second cycle.  I will plan for a total of 4 cycles.   Josph Macho, MD 3/4/20259:51 AM

## 2023-08-14 ENCOUNTER — Inpatient Hospital Stay: Payer: Medicare HMO

## 2023-08-14 VITALS — BP 122/66 | HR 63 | Temp 97.7°F | Resp 20

## 2023-08-14 DIAGNOSIS — Z5112 Encounter for antineoplastic immunotherapy: Secondary | ICD-10-CM | POA: Diagnosis not present

## 2023-08-14 DIAGNOSIS — C50911 Malignant neoplasm of unspecified site of right female breast: Secondary | ICD-10-CM

## 2023-08-14 MED ORDER — PEGFILGRASTIM-JMDB 6 MG/0.6ML ~~LOC~~ SOSY
6.0000 mg | PREFILLED_SYRINGE | Freq: Once | SUBCUTANEOUS | Status: AC
  Administered 2023-08-14: 6 mg via SUBCUTANEOUS
  Filled 2023-08-14: qty 0.6

## 2023-08-14 NOTE — Patient Instructions (Signed)

## 2023-08-25 ENCOUNTER — Other Ambulatory Visit: Payer: Self-pay | Admitting: *Deleted

## 2023-08-25 ENCOUNTER — Encounter: Payer: Self-pay | Admitting: *Deleted

## 2023-08-25 ENCOUNTER — Inpatient Hospital Stay

## 2023-08-25 DIAGNOSIS — C50911 Malignant neoplasm of unspecified site of right female breast: Secondary | ICD-10-CM

## 2023-08-25 DIAGNOSIS — Z5112 Encounter for antineoplastic immunotherapy: Secondary | ICD-10-CM | POA: Diagnosis not present

## 2023-08-25 LAB — COMPREHENSIVE METABOLIC PANEL
ALT: 30 U/L (ref 0–44)
AST: 23 U/L (ref 15–41)
Albumin: 4.5 g/dL (ref 3.5–5.0)
Alkaline Phosphatase: 80 U/L (ref 38–126)
Anion gap: 10 (ref 5–15)
BUN: 9 mg/dL (ref 8–23)
CO2: 28 mmol/L (ref 22–32)
Calcium: 9.4 mg/dL (ref 8.9–10.3)
Chloride: 99 mmol/L (ref 98–111)
Creatinine, Ser: 0.77 mg/dL (ref 0.44–1.00)
GFR, Estimated: >60 mL/min (ref >60)
Glucose, Bld: 114 mg/dL — ABNORMAL HIGH (ref 70–99)
Potassium: 4.2 mmol/L (ref 3.5–5.1)
Sodium: 137 mmol/L (ref 135–145)
Total Bilirubin: 0.4 mg/dL (ref 0.0–1.2)
Total Protein: 7.2 g/dL (ref 6.5–8.1)

## 2023-08-25 LAB — CBC WITH DIFFERENTIAL (CANCER CENTER ONLY)
Abs Immature Granulocytes: 0.41 10*3/uL — ABNORMAL HIGH (ref 0.00–0.07)
Basophils Absolute: 0.1 10*3/uL (ref 0.0–0.1)
Basophils Relative: 1 %
Eosinophils Absolute: 0.1 10*3/uL (ref 0.0–0.5)
Eosinophils Relative: 1 %
HCT: 39.9 % (ref 36.0–46.0)
Hemoglobin: 13.4 g/dL (ref 12.0–15.0)
Immature Granulocytes: 2 %
Lymphocytes Relative: 16 %
Lymphs Abs: 2.8 10*3/uL (ref 0.7–4.0)
MCH: 33.3 pg (ref 26.0–34.0)
MCHC: 33.6 g/dL (ref 30.0–36.0)
MCV: 99 fL (ref 80.0–100.0)
Monocytes Absolute: 1.5 10*3/uL — ABNORMAL HIGH (ref 0.1–1.0)
Monocytes Relative: 9 %
Neutro Abs: 12.2 10*3/uL — ABNORMAL HIGH (ref 1.7–7.7)
Neutrophils Relative %: 71 %
Platelet Count: 145 10*3/uL — ABNORMAL LOW (ref 150–400)
RBC: 4.03 MIL/uL (ref 3.87–5.11)
RDW: 13.1 % (ref 11.5–15.5)
Smear Review: NORMAL
WBC Count: 17.2 10*3/uL — ABNORMAL HIGH (ref 4.0–10.5)
nRBC: 0 % (ref 0.0–0.2)

## 2023-09-01 ENCOUNTER — Other Ambulatory Visit: Payer: Self-pay

## 2023-09-01 ENCOUNTER — Encounter: Payer: Self-pay | Admitting: *Deleted

## 2023-09-01 ENCOUNTER — Encounter: Payer: Self-pay | Admitting: Hematology & Oncology

## 2023-09-01 ENCOUNTER — Inpatient Hospital Stay

## 2023-09-01 ENCOUNTER — Inpatient Hospital Stay (HOSPITAL_BASED_OUTPATIENT_CLINIC_OR_DEPARTMENT_OTHER): Admitting: Hematology & Oncology

## 2023-09-01 VITALS — BP 100/63 | HR 74 | Temp 98.1°F | Resp 20 | Ht 65.0 in | Wt 144.0 lb

## 2023-09-01 VITALS — BP 107/64 | HR 75 | Resp 19

## 2023-09-01 DIAGNOSIS — C50911 Malignant neoplasm of unspecified site of right female breast: Secondary | ICD-10-CM | POA: Diagnosis not present

## 2023-09-01 DIAGNOSIS — Z5112 Encounter for antineoplastic immunotherapy: Secondary | ICD-10-CM | POA: Diagnosis not present

## 2023-09-01 DIAGNOSIS — Z170 Estrogen receptor positive status [ER+]: Secondary | ICD-10-CM

## 2023-09-01 LAB — CBC WITH DIFFERENTIAL (CANCER CENTER ONLY)
Abs Immature Granulocytes: 0.32 10*3/uL — ABNORMAL HIGH (ref 0.00–0.07)
Basophils Absolute: 0.1 10*3/uL (ref 0.0–0.1)
Basophils Relative: 0 %
Eosinophils Absolute: 0 10*3/uL (ref 0.0–0.5)
Eosinophils Relative: 0 %
HCT: 33.3 % — ABNORMAL LOW (ref 36.0–46.0)
Hemoglobin: 11.3 g/dL — ABNORMAL LOW (ref 12.0–15.0)
Immature Granulocytes: 1 %
Lymphocytes Relative: 5 %
Lymphs Abs: 1.2 10*3/uL (ref 0.7–4.0)
MCH: 33.8 pg (ref 26.0–34.0)
MCHC: 33.9 g/dL (ref 30.0–36.0)
MCV: 99.7 fL (ref 80.0–100.0)
Monocytes Absolute: 0.6 10*3/uL (ref 0.1–1.0)
Monocytes Relative: 3 %
Neutro Abs: 21.1 10*3/uL — ABNORMAL HIGH (ref 1.7–7.7)
Neutrophils Relative %: 91 %
Platelet Count: 313 10*3/uL (ref 150–400)
RBC: 3.34 MIL/uL — ABNORMAL LOW (ref 3.87–5.11)
RDW: 13.5 % (ref 11.5–15.5)
WBC Count: 23.3 10*3/uL — ABNORMAL HIGH (ref 4.0–10.5)
nRBC: 0 % (ref 0.0–0.2)

## 2023-09-01 LAB — CMP (CANCER CENTER ONLY)
ALT: 70 U/L — ABNORMAL HIGH (ref 0–44)
AST: 35 U/L (ref 15–41)
Albumin: 4 g/dL (ref 3.5–5.0)
Alkaline Phosphatase: 68 U/L (ref 38–126)
Anion gap: 10 (ref 5–15)
BUN: 14 mg/dL (ref 8–23)
CO2: 24 mmol/L (ref 22–32)
Calcium: 9.1 mg/dL (ref 8.9–10.3)
Chloride: 102 mmol/L (ref 98–111)
Creatinine: 0.69 mg/dL (ref 0.44–1.00)
GFR, Estimated: >60 mL/min (ref >60)
Glucose, Bld: 149 mg/dL — ABNORMAL HIGH (ref 70–99)
Potassium: 4.1 mmol/L (ref 3.5–5.1)
Sodium: 136 mmol/L (ref 135–145)
Total Bilirubin: 0.4 mg/dL (ref 0.0–1.2)
Total Protein: 6.3 g/dL — ABNORMAL LOW (ref 6.5–8.1)

## 2023-09-01 MED ORDER — SODIUM CHLORIDE 0.9% FLUSH
10.0000 mL | INTRAVENOUS | Status: DC | PRN
Start: 2023-09-01 — End: 2023-09-01
  Administered 2023-09-01: 10 mL

## 2023-09-01 MED ORDER — SODIUM CHLORIDE 0.9 % IV SOLN
150.0000 mg | Freq: Once | INTRAVENOUS | Status: AC
  Administered 2023-09-01: 150 mg via INTRAVENOUS
  Filled 2023-09-01: qty 150

## 2023-09-01 MED ORDER — PALONOSETRON HCL INJECTION 0.25 MG/5ML
0.2500 mg | Freq: Once | INTRAVENOUS | Status: AC
  Administered 2023-09-01: 0.25 mg via INTRAVENOUS

## 2023-09-01 MED ORDER — SODIUM CHLORIDE 0.9 % IV SOLN: INTRAVENOUS | Status: DC

## 2023-09-01 MED ORDER — SODIUM CHLORIDE 0.9 % IV SOLN
75.0000 mg/m2 | Freq: Once | INTRAVENOUS | Status: AC
  Administered 2023-09-01: 129 mg via INTRAVENOUS
  Filled 2023-09-01: qty 12.9

## 2023-09-01 MED ORDER — SODIUM CHLORIDE 0.9 % IV SOLN
470.0000 mg | Freq: Once | INTRAVENOUS | Status: AC
  Administered 2023-09-01: 470 mg via INTRAVENOUS
  Filled 2023-09-01: qty 47

## 2023-09-01 MED ORDER — HEPARIN SOD (PORK) LOCK FLUSH 100 UNIT/ML IV SOLN
500.0000 [IU] | Freq: Once | INTRAVENOUS | Status: AC | PRN
  Administered 2023-09-01: 500 [IU]

## 2023-09-01 MED ORDER — TRASTUZUMAB-ANNS CHEMO 150 MG IV SOLR
6.0000 mg/kg | Freq: Once | INTRAVENOUS | Status: AC
  Administered 2023-09-01: 420 mg via INTRAVENOUS
  Filled 2023-09-01: qty 20

## 2023-09-01 MED ORDER — DIPHENHYDRAMINE HCL 25 MG PO CAPS
50.0000 mg | ORAL_CAPSULE | Freq: Once | ORAL | Status: AC
  Administered 2023-09-01: 50 mg via ORAL

## 2023-09-01 MED ORDER — DEXAMETHASONE SODIUM PHOSPHATE 10 MG/ML IJ SOLN
10.0000 mg | Freq: Once | INTRAMUSCULAR | Status: AC
  Administered 2023-09-01: 10 mg via INTRAVENOUS

## 2023-09-01 MED ORDER — SODIUM CHLORIDE 0.9% FLUSH
10.0000 mL | Freq: Once | INTRAVENOUS | Status: AC
  Administered 2023-09-01: 10 mL via INTRAVENOUS

## 2023-09-01 MED ORDER — ACETAMINOPHEN 325 MG PO TABS
650.0000 mg | ORAL_TABLET | Freq: Once | ORAL | Status: AC
  Administered 2023-09-01: 650 mg via ORAL

## 2023-09-01 NOTE — Progress Notes (Signed)
 Hematology and Oncology Follow Up Visit  Rhonda Paul [id number] 1950/08/22 73 y.o. 09/01/2023   Principle Diagnosis:  Stage IA (T1cN0M0) invasive ductal carcinoma of the right breast -- ER+/PR-/HER2+ Remote history of stage I-triple negative-cancer of the right breast-2004  Current Therapy:   Status post right mastectomy --07/04/2023 Adjuvant Carbo/Taxotere/Herceptin/ --start cycle #2/4 on 08/11/2023     Interim History:  Rhonda Paul is back for follow-up.  So far, I think that she did fairly well with the first cycle of chemotherapy.  She is worried about a firm nodule in the right chest wall.  This is a little bit lateral to the mastectomy scar.  Has believe this might just be a granuloma.  We will see about doing a ultrasound to see if this may help.  She also had some nosebleeds with the first cycle of chemotherapy..  I told her to try some Claritin to help with some of the bony pain.  She has had no problems with bowels or bladder.  She has had no melena or bright red blood per rectum.  There is been no issues with leg swelling.  She has had no mouth sores.  She has had little bit of a headache.  Overall, her performance status is ECOG 1.   Medications:  Current Outpatient Medications:    PEG 3350-KCl-NaBcb-NaCl-NaSulf (GOLYTELY PO), See admin instructions., Disp: , Rfl:    apixaban (ELIQUIS) 5 MG TABS tablet, Take 1 tablet (5 mg total) by mouth 2 (two) times daily., Disp: 60 tablet, Rfl: 5   atorvastatin (LIPITOR) 40 MG tablet, TAKE ONE TABLET BY MOUTH DAILY, Disp: 90 tablet, Rfl: 3   Biotin 5000 MCG CHEW, Chew by mouth., Disp: , Rfl:    Biotin w/ Vitamins C & E (HAIR/SKIN/NAILS PO), Take 1 capsule by mouth daily., Disp: , Rfl:    dapagliflozin propanediol (FARXIGA) 10 MG TABS tablet, Take 1 tablet (10 mg total) by mouth daily before breakfast., Disp: 90 tablet, Rfl: 3   dexamethasone (DECADRON) 4 MG tablet, Take 2 tabs by mouth 2 times daily starting day before chemo. Then  take 2 tabs daily for 2 days starting day after chemo. Take with food., Disp: 30 tablet, Rfl: 1   ezetimibe (ZETIA) 10 MG tablet, Take 1 tablet (10 mg total) by mouth daily., Disp: 90 tablet, Rfl: 3   ipratropium (ATROVENT) 0.03 % nasal spray, Place 1 spray into both nostrils daily., Disp: , Rfl:    levothyroxine (SYNTHROID) 50 MCG tablet, TAKE 1 TABLET BY MOUTH DAILY, Disp: 90 tablet, Rfl: 2   lidocaine-prilocaine (EMLA) cream, Apply 1 Application topically as needed. Apply one hour prior to port access and cover with plastic wrap, Disp: 30 g, Rfl: 0   metoprolol succinate (TOPROL-XL) 50 MG 24 hr tablet, TAKE 1 TABLET BY MOUTH DAILY WITH OR IMMEDIATELY FOLLOWING A MEAL, Disp: 90 tablet, Rfl: 1   nitroGLYCERIN (NITROSTAT) 0.4 MG SL tablet, Place 1 tablet (0.4 mg total) under the tongue every 5 (five) minutes as needed for chest pain., Disp: 25 tablet, Rfl: 3   NON FORMULARY, Pt uses cpap nightly, Disp: , Rfl:    ondansetron (ZOFRAN) 8 MG tablet, Take 1 tablet (8 mg total) by mouth every 8 (eight) hours as needed for nausea or vomiting. Start on the third day after chemotherapy., Disp: 30 tablet, Rfl: 1   prochlorperazine (COMPAZINE) 10 MG tablet, Take 1 tablet (10 mg total) by mouth every 6 (six) hours as needed for nausea or vomiting., Disp: 30  tablet, Rfl: 1  Allergies:  Allergies  Allergen Reactions   Lambs Quarters Hives, Itching and Nausea And Vomiting   Entresto [Sacubitril-Valsartan]     Low BP   Lanolin Acid [Lanolin] Rash   Other Itching and Rash    wool    Past Medical History, Surgical history, Social history, and Family History were reviewed and updated.  Review of Systems: Review of Systems  Constitutional: Negative.   HENT:  Negative.    Eyes: Negative.   Respiratory: Negative.    Cardiovascular: Negative.   Gastrointestinal: Negative.   Endocrine: Negative.   Genitourinary: Negative.  Negative for difficulty urinating.   Musculoskeletal: Negative.   Skin: Negative.    Neurological: Negative.   Hematological: Negative.   Psychiatric/Behavioral: Negative.      Physical Exam:  height is 5\' 5"  (1.651 m) and weight is 144 lb (65.3 kg). Her oral temperature is 98.1 F (36.7 C). Her blood pressure is 100/63 and her pulse is 74. Her respiration is 20 and oxygen saturation is 100%.   Wt Readings from Last 3 Encounters:  09/01/23 144 lb (65.3 kg)  08/12/23 145 lb (65.8 kg)  07/23/23 142 lb 12.8 oz (64.8 kg)    Physical Exam Vitals reviewed.  Constitutional:      Comments: Breast exam shows left breast no masses, edema or erythema.  There is no left axillary adenopathy.  Right chest wall shows will healing mastectomy.  There is no erythema or warmth.  There is no right axillary adenopathy.  HENT:     Head: Normocephalic and atraumatic.  Eyes:     Pupils: Pupils are equal, round, and reactive to light.  Cardiovascular:     Rate and Rhythm: Normal rate and regular rhythm.     Heart sounds: Normal heart sounds.  Pulmonary:     Effort: Pulmonary effort is normal.     Breath sounds: Normal breath sounds.  Abdominal:     General: Bowel sounds are normal.     Palpations: Abdomen is soft.  Musculoskeletal:        General: No tenderness or deformity. Normal range of motion.     Cervical back: Normal range of motion.  Lymphadenopathy:     Cervical: No cervical adenopathy.  Skin:    General: Skin is warm and dry.     Findings: No erythema or rash.  Neurological:     Mental Status: She is alert and oriented to person, place, and time.  Psychiatric:        Behavior: Behavior normal.        Thought Content: Thought content normal.        Judgment: Judgment normal.      Lab Results  Component Value Date   WBC 23.3 (H) 09/01/2023   HGB 11.3 (L) 09/01/2023   HCT 33.3 (L) 09/01/2023   MCV 99.7 09/01/2023   PLT 313 09/01/2023     Chemistry      Component Value Date/Time   NA 136 09/01/2023 0940   NA 141 03/24/2023 0832   NA 142 01/30/2017 0847    NA 140 02/01/2016 0841   K 4.1 09/01/2023 0940   K 4.9 (H) 01/30/2017 0847   K 4.8 02/01/2016 0841   CL 102 09/01/2023 0940   CL 104 01/30/2017 0847   CO2 24 09/01/2023 0940   CO2 32 01/30/2017 0847   CO2 27 02/01/2016 0841   BUN 14 09/01/2023 0940   BUN 9 03/24/2023 0832   BUN 11 01/30/2017  0847   BUN 12.7 02/01/2016 0841   CREATININE 0.69 09/01/2023 0940   CREATININE 1.0 01/30/2017 0847   CREATININE 0.8 02/01/2016 0841      Component Value Date/Time   CALCIUM 9.1 09/01/2023 0940   CALCIUM 9.6 01/30/2017 0847   CALCIUM 10.0 02/01/2016 0841   ALKPHOS 68 09/01/2023 0940   ALKPHOS 51 01/30/2017 0847   ALKPHOS 59 02/01/2016 0841   AST 35 09/01/2023 0940   AST 22 02/01/2016 0841   ALT 70 (H) 09/01/2023 0940   ALT 25 01/30/2017 0847   ALT 25 02/01/2016 0841   BILITOT 0.4 09/01/2023 0940   BILITOT 0.93 02/01/2016 0841      Impression and Plan: Ms. Salvador is a very nice 73 year old white female.  She had a remote history of early-stage breast cancer of the right breast.  She had adjuvant chemotherapy for that.  Now, she has a new breast cancer in the right breast.  This cancer is HER2 positive.  We will subsequently go ahead and treat her with anti-HER2 therapy with the chemotherapy in the adjuvant setting.  She has stage I disease.  Thankfully, she had a negative lymph node.  We will go ahead with adjuvant chemotherapy today.  We will utilize Carboplatinum/Taxotere/Herceptin.  I will plan to get her back to see [place] in 3 weeks for her second cycle.  I will plan for a total of 4 cycles.   Josph Macho, MD 3/24/202510:49 AM

## 2023-09-01 NOTE — Patient Instructions (Addendum)
 [facility] - A DEPT OF [facility]  Discharge Instructions: Thank you for choosing [facility] to provide your oncology and hematology care.   If you have a lab appointment with the Cancer Center, please go directly to the Cancer Center and check in at the registration area.  Wear comfortable clothing and clothing appropriate for easy access to any Portacath or PICC line.   We strive to give you quality time with your provider. You may need to reschedule your appointment if you arrive late (15 or more minutes).  Arriving late affects you and other patients whose appointments are after yours.  Also, if you miss three or more appointments without notifying the office, you may be dismissed from the clinic at the provider's discretion.      For prescription refill requests, have your pharmacy contact our office and allow 72 hours for refills to be completed.    Today you received the following chemotherapy and/or immunotherapy agents taxotere, carboplatin, Kanjinti     To help prevent nausea and vomiting after your treatment, we encourage you to take your nausea medication as directed.  BELOW ARE SYMPTOMS THAT SHOULD BE REPORTED IMMEDIATELY: *FEVER GREATER THAN 100.4 F (38 C) OR HIGHER *CHILLS OR SWEATING *NAUSEA AND VOMITING THAT IS NOT CONTROLLED WITH YOUR NAUSEA MEDICATION *UNUSUAL SHORTNESS OF BREATH *UNUSUAL BRUISING OR BLEEDING *URINARY PROBLEMS (pain or burning when urinating, or frequent urination) *BOWEL PROBLEMS (unusual diarrhea, constipation, pain near the anus) TENDERNESS IN MOUTH AND THROAT WITH OR WITHOUT PRESENCE OF ULCERS (sore throat, sores in mouth, or a toothache) UNUSUAL RASH, SWELLING OR PAIN  UNUSUAL VAGINAL DISCHARGE OR ITCHING   Items with * indicate a potential emergency and should be followed up as soon as possible or go to the Emergency Department if any problems should occur.  Please show the CHEMOTHERAPY ALERT  CARD or IMMUNOTHERAPY ALERT CARD at check-in to the Emergency Department and triage nurse. Should you have questions after your visit or need to cancel or reschedule your appointment, please contact [facility] - A DEPT OF [facility]  [phone] and follow the prompts.  Office hours are 8:00 a.m. to 4:30 p.m. Monday - Friday. Please note that voicemails left after 4:00 p.m. may not be returned until the following business day.  We are closed weekends and major holidays. You have access to a nurse at all times for urgent questions. Please call the main number to the clinic [phone] and follow the prompts.  For any non-urgent questions, you may also contact your provider using MyChart. We now offer e-Visits for anyone 47 and older to request care online for non-urgent symptoms. For details visit [email or web address].   Also download the MyChart app! Go to the app store, search "MyChart", open the app, select [place], and log in with your MyChart username and password.

## 2023-09-01 NOTE — Patient Instructions (Signed)

## 2023-09-01 NOTE — Progress Notes (Signed)
 Patient will proceed with cycle two of four planned cycles. She had good tolerance of fist cycle.   Oncology Nurse Navigator Documentation     09/01/2023   10:00 AM  Oncology Nurse Navigator Flowsheets  Navigator Follow Up Date: 09/23/2023  Navigator Follow Up Reason: Follow-up Appointment;Chemotherapy  Navigator Location [facility]  Navigator Encounter Type Treatment  Patient Visit Type MedOnc  Treatment Phase Active Tx  Barriers/Navigation Needs No Barriers At This Time  Interventions None Required  Acuity Level 1-No Barriers  Support Groups/Services Friends and Family  Time Spent with Patient 15

## 2023-09-02 ENCOUNTER — Other Ambulatory Visit: Payer: Self-pay

## 2023-09-02 ENCOUNTER — Inpatient Hospital Stay: Payer: Medicare HMO

## 2023-09-02 ENCOUNTER — Ambulatory Visit: Payer: Medicare HMO

## 2023-09-02 ENCOUNTER — Ambulatory Visit
Admission: RE | Admit: 2023-09-02 | Discharge: 2023-09-02 | Disposition: A | Discharge status: Home / Self Care | Source: Ambulatory Visit | Attending: Hematology & Oncology | Admitting: Hematology & Oncology

## 2023-09-02 ENCOUNTER — Other Ambulatory Visit: Payer: Medicare HMO

## 2023-09-02 ENCOUNTER — Ambulatory Visit: Payer: Medicare HMO | Admitting: Hematology & Oncology

## 2023-09-02 DIAGNOSIS — C50911 Malignant neoplasm of unspecified site of right female breast: Secondary | ICD-10-CM | POA: Diagnosis not present

## 2023-09-02 DIAGNOSIS — Z853 Personal history of malignant neoplasm of breast: Secondary | ICD-10-CM | POA: Diagnosis not present

## 2023-09-03 ENCOUNTER — Inpatient Hospital Stay

## 2023-09-03 VITALS — BP 112/69 | HR 64 | Temp 97.9°F | Resp 18

## 2023-09-03 DIAGNOSIS — C50911 Malignant neoplasm of unspecified site of right female breast: Secondary | ICD-10-CM

## 2023-09-03 DIAGNOSIS — Z5112 Encounter for antineoplastic immunotherapy: Secondary | ICD-10-CM | POA: Diagnosis not present

## 2023-09-03 MED ORDER — PEGFILGRASTIM-JMDB 6 MG/0.6ML ~~LOC~~ SOSY
6.0000 mg | PREFILLED_SYRINGE | Freq: Once | SUBCUTANEOUS | Status: AC
  Administered 2023-09-03: 6 mg via SUBCUTANEOUS
  Filled 2023-09-03: qty 0.6

## 2023-09-03 NOTE — Patient Instructions (Signed)

## 2023-09-04 ENCOUNTER — Inpatient Hospital Stay: Payer: Medicare HMO

## 2023-09-08 ENCOUNTER — Other Ambulatory Visit: Payer: Self-pay

## 2023-09-08 MED ORDER — DAPAGLIFLOZIN PROPANEDIOL 10 MG PO TABS: 10.0000 mg | ORAL_TABLET | Freq: Every day | ORAL | 1 refills | Status: DC

## 2023-09-10 ENCOUNTER — Other Ambulatory Visit: Payer: Self-pay

## 2023-09-10 ENCOUNTER — Encounter: Payer: Self-pay | Admitting: Hematology & Oncology

## 2023-09-10 DIAGNOSIS — R040 Epistaxis: Secondary | ICD-10-CM

## 2023-09-10 DIAGNOSIS — C50911 Malignant neoplasm of unspecified site of right female breast: Secondary | ICD-10-CM | POA: Diagnosis not present

## 2023-09-11 ENCOUNTER — Encounter: Payer: Self-pay | Admitting: Hematology & Oncology

## 2023-09-11 ENCOUNTER — Encounter (HOSPITAL_BASED_OUTPATIENT_CLINIC_OR_DEPARTMENT_OTHER): Payer: Self-pay

## 2023-09-15 ENCOUNTER — Encounter: Payer: Self-pay | Admitting: *Deleted

## 2023-09-16 DIAGNOSIS — R040 Epistaxis: Secondary | ICD-10-CM | POA: Diagnosis not present

## 2023-09-16 DIAGNOSIS — Z7901 Long term (current) use of anticoagulants: Secondary | ICD-10-CM | POA: Diagnosis not present

## 2023-09-22 ENCOUNTER — Ambulatory Visit: Payer: Self-pay | Attending: Surgery

## 2023-09-22 VITALS — Wt 142.0 lb

## 2023-09-22 DIAGNOSIS — Z483 Aftercare following surgery for neoplasm: Secondary | ICD-10-CM | POA: Insufficient documentation

## 2023-09-22 NOTE — Therapy (Signed)
 OUTPATIENT PHYSICAL THERAPY SOZO SCREENING NOTE   Patient Name: Rhonda Paul MRN: [id number] DOB:Jun 10, 1951, 73 y.o., female Today's Date: 09/22/2023  PCP: Sigmund Hazel, MD REFERRING PROVIDER: Harriette Bouillon, MD   PT End of Session - 09/22/23 1107     Visit Number 2   # unchnaged due to screen only   PT Start Time 1104    PT Stop Time 1108    PT Time Calculation (min) 4 min    Activity Tolerance Patient tolerated treatment well    Behavior During Therapy [facility] for tasks assessed/performed             Past Medical History:  Diagnosis Date   Adenocarcinoma of breast (HCC) 11/2001   RIGHT   Arthritis    CHF (congestive heart failure) (HCC)    Chronic anticoagulation    Eliquis   Coronary artery disease    Nonobstructive   Diverticulitis 2009   diverticulitis   Dysrhythmia    A. Fib   Heart attack (HCC)    One week after stroke   Heart murmur    HOH (hard of hearing)    right ear; wears hearing aid   Hypothyroidism    Infertility, female 02/2000   4 SAB   Metrorrhagia    Migraine 02/2000   Permanent atrial fibrillation (HCC) 04/01/2014   Personal history of chemotherapy 2003   Personal history of radiation therapy 2003   Sleep apnea    STD (sexually transmitted disease) 1976   Hx of HSV   Stroke (HCC) 01/03/2015   s/p TPA R-MCA   Vitamin D deficiency 07/2006   Past Surgical History:  Procedure Laterality Date   APPENDECTOMY  1959   BREAST BIOPSY Right 05/19/2023   [place] RT BREAST BX W LOC DEV 1ST LESION IMG BX SPEC US GUIDE 05/19/2023 [facility] MAMMOGRAPHY   BREAST SURGERY Right 11/2001   lumpectomy with sentinel node biopsy X 3 all negative, ER/PR negative , chemo 6 doses, and radiation daily X 6 weeks   BUBBLE STUDY  04/18/2022   Procedure: BUBBLE STUDY;  Surgeon: Christell Constant, MD;  Location: [facility];  Service: Cardiovascular;;   CARDIAC CATHETERIZATION N/A 01/16/2015   Procedure: Left Heart Cath and Coronary Angiography;  Surgeon: Lyn Records, MD; OM1 75%, RCA 30%, EF normal; spasm in RCA w/ catheter engagement    COLONOSCOPY  11/2012   diverticulitis, 2 polyps negative, repeat in 5 years   HALLUX FUSION Left 03/03/2017   Procedure: HALLUX RIGIDUS CORRECTION WITH COLECTOMY DEBRIDEMENT AND CAPSULAR RELEASE OF THE FIRST MPJ WITH IMPLANT LEFT FOOT;  Surgeon: Larey Dresser, DPM;  Location: [facility];  Service: Podiatry;  Laterality: Left;   JOINT REPLACEMENT Left 02/2017   big toe   MASTECTOMY W/ SENTINEL NODE BIOPSY Right 07/04/2023   Procedure: RIGHT SIMPLE MASTECTOMY SENTINEL LYMPH NODE MAPPING;  Surgeon: Harriette Bouillon, MD;  Location: [facility] OR;  Service: General;  Laterality: Right;  PEC BLOCK   PORT-A-CATH REMOVAL  2004   PORTA CATH INSERTION  2003   PORTACATH PLACEMENT Right 07/04/2023   Procedure: INSERTION PORT-A-CATH WITH ULTRASOUND GUIDANCE;  Surgeon: Harriette Bouillon, MD;  Location: [facility] OR;  Service: General;  Laterality: Right;   RADIOLOGY WITH ANESTHESIA N/A 01/03/2015   Procedure: RADIOLOGY WITH ANESTHESIA;  Surgeon: Medication Radiologist, MD;  Location: [facility] OR;  Service: Radiology;  Laterality: N/A;   TEE WITHOUT CARDIOVERSION N/A 04/18/2022   Procedure: TRANSESOPHAGEAL ECHOCARDIOGRAM (TEE);  Surgeon: Christell Constant, MD;  [facility]  ENDOSCOPY;  Service: Cardiovascular;  Laterality: N/A;   Patient Active Problem List   Diagnosis Date Noted   Breast cancer, stage 1, estrogen receptor positive, right (HCC) 07/04/2023   CAD in native artery 07/28/2016   Chronic anticoagulation     NSTEMI- med rx, culprit lesion 75% OM1 01/14/2015   Atelectasis    Acute respiratory failure with hypoxemia (HCC)    Cerebral infarction (HCC) 01/03/2015   Permanent atrial fibrillation (HCC) 04/01/2014   Moderate mitral regurgitation 04/01/2014   Breast cancer (HCC) 04/01/2014   Hyperlipidemia 04/01/2014    REFERRING DIAG: right breast cancer at risk for lymphedema  THERAPY DIAG: Aftercare following  surgery for neoplasm  PERTINENT HISTORY: Patient was diagnosed with right grade 2 IDC. It measures 1.3 cm and is located in the upper inner quadrant. It is ER pos, PR neg, HER2pos with a Ki67 of 15%. Hx of Rt breast cancer 20 years ago with lumpectomy and radiation and chemotherapy. Rt mastectomy  on 07/04/23 with 1 negative node removed. Will start chemo on 08/11/23.  Moderate mitral valve regurgitation and Afib (4 total with the previous lumpectomy)   PRECAUTIONS: right UE Lymphedema risk, None  SUBJECTIVE: Pt returns for her first 3 month L-Dex screen.   PAIN:  Are you having pain? No  SOZO SCREENING: Patient was assessed today using the SOZO machine to determine the lymphedema index score. This was compared to her baseline score. It was determined that she is within the recommended range when compared to her baseline and no further action is needed at this time. She will continue SOZO screenings. These are done every 3 months for 2 years post operatively followed by every 6 months for 2 years, and then annually.   L-DEX FLOWSHEETS - 09/22/23 1100       L-DEX LYMPHEDEMA SCREENING   Measurement Type Unilateral    L-DEX MEASUREMENT EXTREMITY Upper Extremity    POSITION  Standing    DOMINANT SIDE Right    At Risk Side Right    BASELINE SCORE (UNILATERAL) -1.6    L-DEX SCORE (UNILATERAL) -1.7    VALUE CHANGE (UNILAT) -0.1               Denyce Flank, PTA 09/22/2023, 11:08 AM

## 2023-09-23 ENCOUNTER — Encounter: Payer: Self-pay | Admitting: *Deleted

## 2023-09-23 ENCOUNTER — Other Ambulatory Visit: Payer: Self-pay

## 2023-09-23 ENCOUNTER — Inpatient Hospital Stay

## 2023-09-23 ENCOUNTER — Inpatient Hospital Stay (HOSPITAL_BASED_OUTPATIENT_CLINIC_OR_DEPARTMENT_OTHER): Admitting: Hematology & Oncology

## 2023-09-23 ENCOUNTER — Encounter: Payer: Self-pay | Admitting: Hematology & Oncology

## 2023-09-23 ENCOUNTER — Inpatient Hospital Stay: Attending: Hematology & Oncology

## 2023-09-23 VITALS — BP 102/69 | HR 86 | Temp 97.7°F | Resp 17 | Ht 65.0 in | Wt 143.0 lb

## 2023-09-23 DIAGNOSIS — J9811 Atelectasis: Secondary | ICD-10-CM

## 2023-09-23 DIAGNOSIS — Z5111 Encounter for antineoplastic chemotherapy: Secondary | ICD-10-CM | POA: Insufficient documentation

## 2023-09-23 DIAGNOSIS — Z170 Estrogen receptor positive status [ER+]: Secondary | ICD-10-CM

## 2023-09-23 DIAGNOSIS — Z5112 Encounter for antineoplastic immunotherapy: Secondary | ICD-10-CM | POA: Insufficient documentation

## 2023-09-23 DIAGNOSIS — J9601 Acute respiratory failure with hypoxia: Secondary | ICD-10-CM

## 2023-09-23 DIAGNOSIS — I214 Non-ST elevation (NSTEMI) myocardial infarction: Secondary | ICD-10-CM

## 2023-09-23 DIAGNOSIS — Z1731 Human epidermal growth factor receptor 2 positive status: Secondary | ICD-10-CM | POA: Diagnosis not present

## 2023-09-23 DIAGNOSIS — C50911 Malignant neoplasm of unspecified site of right female breast: Secondary | ICD-10-CM | POA: Diagnosis not present

## 2023-09-23 DIAGNOSIS — Z79620 Long term (current) use of immunosuppressive biologic: Secondary | ICD-10-CM | POA: Insufficient documentation

## 2023-09-23 DIAGNOSIS — I2510 Atherosclerotic heart disease of native coronary artery without angina pectoris: Secondary | ICD-10-CM

## 2023-09-23 DIAGNOSIS — C50011 Malignant neoplasm of nipple and areola, right female breast: Secondary | ICD-10-CM

## 2023-09-23 DIAGNOSIS — I340 Nonrheumatic mitral (valve) insufficiency: Secondary | ICD-10-CM

## 2023-09-23 DIAGNOSIS — I4821 Permanent atrial fibrillation: Secondary | ICD-10-CM

## 2023-09-23 DIAGNOSIS — C50811 Malignant neoplasm of overlapping sites of right female breast: Secondary | ICD-10-CM | POA: Diagnosis present

## 2023-09-23 DIAGNOSIS — E782 Mixed hyperlipidemia: Secondary | ICD-10-CM

## 2023-09-23 DIAGNOSIS — Z7901 Long term (current) use of anticoagulants: Secondary | ICD-10-CM

## 2023-09-23 LAB — CMP (CANCER CENTER ONLY)
ALT: 23 U/L (ref 0–44)
AST: 18 U/L (ref 15–41)
Albumin: 4.1 g/dL (ref 3.5–5.0)
Alkaline Phosphatase: 58 U/L (ref 38–126)
Anion gap: 10 (ref 5–15)
BUN: 11 mg/dL (ref 8–23)
CO2: 24 mmol/L (ref 22–32)
Calcium: 9.2 mg/dL (ref 8.9–10.3)
Chloride: 102 mmol/L (ref 98–111)
Creatinine: 0.78 mg/dL (ref 0.44–1.00)
GFR, Estimated: >60 mL/min (ref >60)
Glucose, Bld: 181 mg/dL — ABNORMAL HIGH (ref 70–99)
Potassium: 4.2 mmol/L (ref 3.5–5.1)
Sodium: 136 mmol/L (ref 135–145)
Total Bilirubin: 0.7 mg/dL (ref 0.0–1.2)
Total Protein: 6.4 g/dL — ABNORMAL LOW (ref 6.5–8.1)

## 2023-09-23 LAB — CBC WITH DIFFERENTIAL (CANCER CENTER ONLY)
Abs Immature Granulocytes: 0.35 10*3/uL — ABNORMAL HIGH (ref 0.00–0.07)
Basophils Absolute: 0 10*3/uL (ref 0.0–0.1)
Basophils Relative: 0 %
Eosinophils Absolute: 0 10*3/uL (ref 0.0–0.5)
Eosinophils Relative: 0 %
HCT: 30.8 % — ABNORMAL LOW (ref 36.0–46.0)
Hemoglobin: 10.4 g/dL — ABNORMAL LOW (ref 12.0–15.0)
Immature Granulocytes: 2 %
Lymphocytes Relative: 4 %
Lymphs Abs: 1 10*3/uL (ref 0.7–4.0)
MCH: 34 pg (ref 26.0–34.0)
MCHC: 33.8 g/dL (ref 30.0–36.0)
MCV: 100.7 fL — ABNORMAL HIGH (ref 80.0–100.0)
Monocytes Absolute: 0.8 10*3/uL (ref 0.1–1.0)
Monocytes Relative: 4 %
Neutro Abs: 21.3 10*3/uL — ABNORMAL HIGH (ref 1.7–7.7)
Neutrophils Relative %: 90 %
Platelet Count: 293 10*3/uL (ref 150–400)
RBC: 3.06 MIL/uL — ABNORMAL LOW (ref 3.87–5.11)
RDW: 15.9 % — ABNORMAL HIGH (ref 11.5–15.5)
WBC Count: 23.5 10*3/uL — ABNORMAL HIGH (ref 4.0–10.5)
nRBC: 0 % (ref 0.0–0.2)

## 2023-09-23 MED ORDER — PALONOSETRON HCL INJECTION 0.25 MG/5ML
0.2500 mg | Freq: Once | INTRAVENOUS | Status: AC
  Administered 2023-09-23: 0.25 mg via INTRAVENOUS
  Filled 2023-09-23: qty 5

## 2023-09-23 MED ORDER — DOCETAXEL CHEMO INJECTION 160 MG/16ML
75.0000 mg/m2 | Freq: Once | INTRAVENOUS | Status: AC
  Administered 2023-09-23: 129 mg via INTRAVENOUS
  Filled 2023-09-23: qty 12.9

## 2023-09-23 MED ORDER — SODIUM CHLORIDE 0.9% FLUSH
10.0000 mL | INTRAVENOUS | Status: DC | PRN
  Administered 2023-09-23: 10 mL

## 2023-09-23 MED ORDER — DEXAMETHASONE SODIUM PHOSPHATE 10 MG/ML IJ SOLN
10.0000 mg | Freq: Once | INTRAMUSCULAR | Status: AC
  Administered 2023-09-23: 10 mg via INTRAVENOUS
  Filled 2023-09-23: qty 1

## 2023-09-23 MED ORDER — HEPARIN SOD (PORK) LOCK FLUSH 100 UNIT/ML IV SOLN
500.0000 [IU] | Freq: Once | INTRAVENOUS | Status: AC | PRN
Start: 2023-09-23 — End: 2023-09-23
  Administered 2023-09-23: 500 [IU]

## 2023-09-23 MED ORDER — SODIUM CHLORIDE 0.9 % IV SOLN
150.0000 mg | Freq: Once | INTRAVENOUS | Status: AC
  Administered 2023-09-23: 150 mg via INTRAVENOUS
  Filled 2023-09-23: qty 150

## 2023-09-23 MED ORDER — DIPHENHYDRAMINE HCL 25 MG PO CAPS
50.0000 mg | ORAL_CAPSULE | Freq: Once | ORAL | Status: AC
  Administered 2023-09-23: 50 mg via ORAL
  Filled 2023-09-23: qty 2

## 2023-09-23 MED ORDER — ACETAMINOPHEN 325 MG PO TABS
650.0000 mg | ORAL_TABLET | Freq: Once | ORAL | Status: AC
  Administered 2023-09-23: 650 mg via ORAL
  Filled 2023-09-23: qty 2

## 2023-09-23 MED ORDER — CARBOPLATIN CHEMO INJECTION 600 MG/60ML
466.8000 mg | Freq: Once | INTRAVENOUS | Status: AC
  Administered 2023-09-23: 470 mg via INTRAVENOUS
  Filled 2023-09-23: qty 47

## 2023-09-23 MED ORDER — TRASTUZUMAB-ANNS CHEMO 150 MG IV SOLR
6.0000 mg/kg | Freq: Once | INTRAVENOUS | Status: AC
  Administered 2023-09-23: 420 mg via INTRAVENOUS
  Filled 2023-09-23: qty 20

## 2023-09-23 MED ORDER — SODIUM CHLORIDE 0.9 % IV SOLN: INTRAVENOUS | Status: DC

## 2023-09-23 NOTE — Patient Instructions (Signed)

## 2023-09-23 NOTE — Progress Notes (Signed)
 Patient is here for cycle three of four. She has been experiencing nose bleeds since the last cycle. She has been working with ENT, but still having issues. After cycle four she will likely continue with anit-HER2 maintenance.   Oncology Nurse Navigator Documentation     09/23/2023   11:00 AM  Oncology Nurse Navigator Flowsheets  Navigator Follow Up Date: 10/14/2023  Navigator Follow Up Reason: Follow-up Appointment;Chemotherapy  Navigator Location [facility]  Navigator Encounter Type Follow-up Appt  Patient Visit Type MedOnc  Treatment Phase Active Tx  Barriers/Navigation Needs No Barriers At This Time  Interventions None Required  Acuity Level 1-No Barriers  Support Groups/Services Friends and Family  Time Spent with Patient 15

## 2023-09-23 NOTE — Progress Notes (Signed)
 Hematology and Oncology Follow Up Visit  Rhonda Paul [id number] [birth year] 73 y.o. 09/23/2023   Principle Diagnosis:  Stage IA (T1cN0M0) invasive ductal carcinoma of the right breast -- ER+/PR-/HER2+ Remote history of stage I-triple negative-cancer of the right breast-2004  Current Therapy:   Status post right mastectomy --07/04/2023 Adjuvant Carbo/Taxotere/Herceptin/ --start cycle #3/4 on 08/11/2023     Interim History:  Rhonda Paul is back for follow-up.  The problem still has been some nosebleeds.  She seems to have these about a week or so after treatment.  She has seen ENT.  They gave her some Bactroban ointment to put in her nostrils and hopefully this may help with bleeding.  She will see them in about a week or so after this treatment.  Otherwise, she is doing quite well.  She has had no fever.  She has had no cough.  There has been little bit of nausea but no vomiting.  She may have a little bit of diarrhea.  She is on Eliquis because of atrial fibrillation.  I am sure this is probably not helping her having nosebleeds.  Her blood count really has never gone down significantly enough to cause nosebleeds..  She has had no rashes.  She has had little bit of swelling in the legs, likely from steroids.  She has had no headache.  There is been no mouth sores.  Overall, I would say that her performance status is probably ECOG 1.    Medications:  Current Outpatient Medications:    mupirocin ointment (BACTROBAN) 2 %, Apply 1 Application topically., Disp: , Rfl:    apixaban (ELIQUIS) 5 MG TABS tablet, Take 1 tablet (5 mg total) by mouth 2 (two) times daily., Disp: 60 tablet, Rfl: 5   atorvastatin (LIPITOR) 40 MG tablet, TAKE ONE TABLET BY MOUTH DAILY, Disp: 90 tablet, Rfl: 3   Biotin 5000 MCG CHEW, Chew by mouth., Disp: , Rfl:    Biotin w/ Vitamins C & E (HAIR/SKIN/NAILS PO), Take 1 capsule by mouth daily., Disp: , Rfl:    dapagliflozin propanediol (FARXIGA) 10 MG TABS tablet, Take  1 tablet (10 mg total) by mouth daily before breakfast., Disp: 90 tablet, Rfl: 1   dexamethasone (DECADRON) 4 MG tablet, Take 2 tabs by mouth 2 times daily starting day before chemo. Then take 2 tabs daily for 2 days starting day after chemo. Take with food., Disp: 30 tablet, Rfl: 1   ezetimibe (ZETIA) 10 MG tablet, Take 1 tablet (10 mg total) by mouth daily., Disp: 90 tablet, Rfl: 3   ipratropium (ATROVENT) 0.03 % nasal spray, Place 1 spray into both nostrils daily., Disp: , Rfl:    levothyroxine (SYNTHROID) 50 MCG tablet, TAKE 1 TABLET BY MOUTH DAILY, Disp: 90 tablet, Rfl: 2   lidocaine-prilocaine (EMLA) cream, Apply 1 Application topically as needed. Apply one hour prior to port access and cover with plastic wrap, Disp: 30 g, Rfl: 0   metoprolol succinate (TOPROL-XL) 50 MG 24 hr tablet, TAKE 1 TABLET BY MOUTH DAILY WITH OR IMMEDIATELY FOLLOWING A MEAL, Disp: 90 tablet, Rfl: 1   nitroGLYCERIN (NITROSTAT) 0.4 MG SL tablet, Place 1 tablet (0.4 mg total) under the tongue every 5 (five) minutes as needed for chest pain., Disp: 25 tablet, Rfl: 3   NON FORMULARY, Pt uses cpap nightly, Disp: , Rfl:    ondansetron (ZOFRAN) 8 MG tablet, Take 1 tablet (8 mg total) by mouth every 8 (eight) hours as needed for nausea or vomiting. Start on the  third day after chemotherapy., Disp: 30 tablet, Rfl: 1   PEG 3350-KCl-NaBcb-NaCl-NaSulf (GOLYTELY PO), See admin instructions., Disp: , Rfl:    prochlorperazine (COMPAZINE) 10 MG tablet, Take 1 tablet (10 mg total) by mouth every 6 (six) hours as needed for nausea or vomiting., Disp: 30 tablet, Rfl: 1 No current facility-administered medications for this visit.  Facility-Administered Medications Ordered in Other Visits:    0.9 %  sodium chloride infusion, , Intravenous, Continuous, Darlin Stenseth, Rose Phi, MD   acetaminophen (TYLENOL) tablet 650 mg, 650 mg, Oral, Once, Rachelanne Whidby, Rose Phi, MD   dexamethasone (DECADRON) injection 10 mg, 10 mg, Intravenous, Once, Ellon Marasco, Rose Phi,  MD   diphenhydrAMINE (BENADRYL) capsule 50 mg, 50 mg, Oral, Once, Zena Vitelli, Rose Phi, MD   fosaprepitant (EMEND) 150 mg in sodium chloride 0.9 % 145 mL IVPB, 150 mg, Intravenous, Once, Cristiana Yochim, Rose Phi, MD   heparin lock flush 100 unit/mL, 500 Units, Intracatheter, Once PRN, Josph Macho, MD   palonosetron (ALOXI) injection 0.25 mg, 0.25 mg, Intravenous, Once, Payal Stanforth, Rose Phi, MD   sodium chloride flush (NS) 0.9 % injection 10 mL, 10 mL, Intracatheter, PRN, Josph Macho, MD  Allergies:  Allergies  Allergen Reactions   Lambs Quarters Hives, Itching and Nausea And Vomiting   Entresto [Sacubitril-Valsartan]     Low BP   Lanolin Acid [Lanolin] Rash   Other Itching and Rash    wool    Past Medical History, Surgical history, Social history, and Family History were reviewed and updated.  Review of Systems: Review of Systems  Constitutional: Negative.   HENT:  Negative.    Eyes: Negative.   Respiratory: Negative.    Cardiovascular: Negative.   Gastrointestinal: Negative.   Endocrine: Negative.   Genitourinary: Negative.  Negative for difficulty urinating.   Musculoskeletal: Negative.   Skin: Negative.   Neurological: Negative.   Hematological: Negative.   Psychiatric/Behavioral: Negative.      Physical Exam:  height is 5\' 5"  (1.651 m) and weight is 143 lb (64.9 kg). Her oral temperature is 97.7 F (36.5 C). Her blood pressure is 102/69 and her pulse is 86. Her respiration is 17 and oxygen saturation is 100%.   Wt Readings from Last 3 Encounters:  09/23/23 143 lb (64.9 kg)  09/22/23 142 lb (64.4 kg)  09/01/23 144 lb (65.3 kg)    Physical Exam Vitals reviewed.  Constitutional:      Comments: Breast exam shows left breast no masses, edema or erythema.  There is no left axillary adenopathy.  Right chest wall shows will healing mastectomy.  There is no erythema or warmth.  There is no right axillary adenopathy.  HENT:     Head: Normocephalic and atraumatic.  Eyes:      Pupils: Pupils are equal, round, and reactive to light.  Cardiovascular:     Rate and Rhythm: Normal rate and regular rhythm.     Heart sounds: Normal heart sounds.  Pulmonary:     Effort: Pulmonary effort is normal.     Breath sounds: Normal breath sounds.  Abdominal:     General: Bowel sounds are normal.     Palpations: Abdomen is soft.  Musculoskeletal:        General: No tenderness or deformity. Normal range of motion.     Cervical back: Normal range of motion.  Lymphadenopathy:     Cervical: No cervical adenopathy.  Skin:    General: Skin is warm and dry.     Findings: No erythema or rash.  Neurological:     Mental Status: She is alert and oriented to person, place, and time.  Psychiatric:        Behavior: Behavior normal.        Thought Content: Thought content normal.        Judgment: Judgment normal.      Lab Results  Component Value Date   WBC 23.5 (H) 09/23/2023   HGB 10.4 (L) 09/23/2023   HCT 30.8 (L) 09/23/2023   MCV 100.7 (H) 09/23/2023   PLT 293 09/23/2023     Chemistry      Component Value Date/Time   NA 136 09/23/2023 0940   NA 141 03/24/2023 0832   NA 142 01/30/2017 0847   NA 140 02/01/2016 0841   K 4.2 09/23/2023 0940   K 4.9 (H) 01/30/2017 0847   K 4.8 02/01/2016 0841   CL 102 09/23/2023 0940   CL 104 01/30/2017 0847   CO2 24 09/23/2023 0940   CO2 32 01/30/2017 0847   CO2 27 02/01/2016 0841   BUN 11 09/23/2023 0940   BUN 9 03/24/2023 0832   BUN 11 01/30/2017 0847   BUN 12.7 02/01/2016 0841   CREATININE 0.78 09/23/2023 0940   CREATININE 1.0 01/30/2017 0847   CREATININE 0.8 02/01/2016 0841      Component Value Date/Time   CALCIUM 9.2 09/23/2023 0940   CALCIUM 9.6 01/30/2017 0847   CALCIUM 10.0 02/01/2016 0841   ALKPHOS 58 09/23/2023 0940   ALKPHOS 51 01/30/2017 0847   ALKPHOS 59 02/01/2016 0841   AST 18 09/23/2023 0940   AST 22 02/01/2016 0841   ALT 23 09/23/2023 0940   ALT 25 01/30/2017 0847   ALT 25 02/01/2016 0841   BILITOT  0.7 09/23/2023 0940   BILITOT 0.93 02/01/2016 0841      Impression and Plan: Ms. Polka is a very nice 73 year old white female.  She had a remote history of early-stage breast cancer of the right breast.  She had adjuvant chemotherapy for that.    Now, she has a new breast cancer in the right breast.  This cancer is HER2 positive.  We will subsequently go ahead and treat her with anti-HER2 therapy with the chemotherapy in the adjuvant setting.  She has stage I disease.  Thankfully, she had a negative lymph node.  This is the l third dose  of adjuvant chemotherapy.  After the fourth and final dose, we will then see about anti-HER2 therapy.  I may want to consider her for Kadcyla.  I think this would be a reasonable option for her in the adjuvant setting.  We will have her come back to see us  in 3 more weeks.  Ivor Mars, MD 4/15/202510:52 AM

## 2023-09-25 ENCOUNTER — Inpatient Hospital Stay

## 2023-09-25 VITALS — BP 96/74 | HR 79 | Temp 97.6°F | Resp 18

## 2023-09-25 DIAGNOSIS — Z5112 Encounter for antineoplastic immunotherapy: Secondary | ICD-10-CM | POA: Diagnosis not present

## 2023-09-25 DIAGNOSIS — C50911 Malignant neoplasm of unspecified site of right female breast: Secondary | ICD-10-CM

## 2023-09-25 MED ORDER — PEGFILGRASTIM-JMDB 6 MG/0.6ML ~~LOC~~ SOSY
6.0000 mg | PREFILLED_SYRINGE | Freq: Once | SUBCUTANEOUS | Status: AC
  Administered 2023-09-25: 6 mg via SUBCUTANEOUS
  Filled 2023-09-25: qty 0.6

## 2023-09-25 NOTE — Patient Instructions (Signed)

## 2023-10-06 DIAGNOSIS — L821 Other seborrheic keratosis: Secondary | ICD-10-CM | POA: Diagnosis not present

## 2023-10-06 DIAGNOSIS — L814 Other melanin hyperpigmentation: Secondary | ICD-10-CM | POA: Diagnosis not present

## 2023-10-06 DIAGNOSIS — L718 Other rosacea: Secondary | ICD-10-CM | POA: Diagnosis not present

## 2023-10-14 ENCOUNTER — Inpatient Hospital Stay (HOSPITAL_BASED_OUTPATIENT_CLINIC_OR_DEPARTMENT_OTHER): Admitting: Hematology & Oncology

## 2023-10-14 ENCOUNTER — Encounter: Payer: Self-pay | Admitting: Hematology & Oncology

## 2023-10-14 ENCOUNTER — Encounter: Payer: Self-pay | Admitting: *Deleted

## 2023-10-14 ENCOUNTER — Inpatient Hospital Stay: Attending: Hematology & Oncology

## 2023-10-14 ENCOUNTER — Inpatient Hospital Stay

## 2023-10-14 ENCOUNTER — Other Ambulatory Visit: Payer: Self-pay

## 2023-10-14 VITALS — BP 94/54 | HR 84 | Temp 98.3°F | Resp 17 | Ht 65.0 in | Wt 143.0 lb

## 2023-10-14 VITALS — BP 92/66 | HR 79

## 2023-10-14 DIAGNOSIS — C50911 Malignant neoplasm of unspecified site of right female breast: Secondary | ICD-10-CM

## 2023-10-14 DIAGNOSIS — Z79811 Long term (current) use of aromatase inhibitors: Secondary | ICD-10-CM | POA: Diagnosis not present

## 2023-10-14 DIAGNOSIS — Z170 Estrogen receptor positive status [ER+]: Secondary | ICD-10-CM

## 2023-10-14 DIAGNOSIS — Z1731 Human epidermal growth factor receptor 2 positive status: Secondary | ICD-10-CM | POA: Diagnosis not present

## 2023-10-14 DIAGNOSIS — Z5112 Encounter for antineoplastic immunotherapy: Secondary | ICD-10-CM | POA: Insufficient documentation

## 2023-10-14 DIAGNOSIS — C50811 Malignant neoplasm of overlapping sites of right female breast: Secondary | ICD-10-CM | POA: Insufficient documentation

## 2023-10-14 DIAGNOSIS — Z5189 Encounter for other specified aftercare: Secondary | ICD-10-CM | POA: Diagnosis not present

## 2023-10-14 DIAGNOSIS — Z5111 Encounter for antineoplastic chemotherapy: Secondary | ICD-10-CM | POA: Diagnosis present

## 2023-10-14 LAB — CBC WITH DIFFERENTIAL (CANCER CENTER ONLY)
Abs Immature Granulocytes: 0.23 10*3/uL — ABNORMAL HIGH (ref 0.00–0.07)
Basophils Absolute: 0 10*3/uL (ref 0.0–0.1)
Basophils Relative: 0 %
Eosinophils Absolute: 0 10*3/uL (ref 0.0–0.5)
Eosinophils Relative: 0 %
HCT: 28.2 % — ABNORMAL LOW (ref 36.0–46.0)
Hemoglobin: 9.5 g/dL — ABNORMAL LOW (ref 12.0–15.0)
Immature Granulocytes: 1 %
Lymphocytes Relative: 5 %
Lymphs Abs: 0.8 10*3/uL (ref 0.7–4.0)
MCH: 35.4 pg — ABNORMAL HIGH (ref 26.0–34.0)
MCHC: 33.7 g/dL (ref 30.0–36.0)
MCV: 105.2 fL — ABNORMAL HIGH (ref 80.0–100.0)
Monocytes Absolute: 0.8 10*3/uL (ref 0.1–1.0)
Monocytes Relative: 5 %
Neutro Abs: 14.7 10*3/uL — ABNORMAL HIGH (ref 1.7–7.7)
Neutrophils Relative %: 89 %
Platelet Count: 240 10*3/uL (ref 150–400)
RBC: 2.68 MIL/uL — ABNORMAL LOW (ref 3.87–5.11)
RDW: 19.2 % — ABNORMAL HIGH (ref 11.5–15.5)
WBC Count: 16.6 10*3/uL — ABNORMAL HIGH (ref 4.0–10.5)
nRBC: 0 % (ref 0.0–0.2)

## 2023-10-14 LAB — CMP (CANCER CENTER ONLY)
ALT: 18 U/L (ref 0–44)
AST: 18 U/L (ref 15–41)
Albumin: 4 g/dL (ref 3.5–5.0)
Alkaline Phosphatase: 46 U/L (ref 38–126)
Anion gap: 10 (ref 5–15)
BUN: 11 mg/dL (ref 8–23)
CO2: 25 mmol/L (ref 22–32)
Calcium: 8.8 mg/dL — ABNORMAL LOW (ref 8.9–10.3)
Chloride: 101 mmol/L (ref 98–111)
Creatinine: 0.72 mg/dL (ref 0.44–1.00)
GFR, Estimated: >60 mL/min (ref >60)
Glucose, Bld: 175 mg/dL — ABNORMAL HIGH (ref 70–99)
Potassium: 4 mmol/L (ref 3.5–5.1)
Sodium: 136 mmol/L (ref 135–145)
Total Bilirubin: 0.8 mg/dL (ref 0.0–1.2)
Total Protein: 6.1 g/dL — ABNORMAL LOW (ref 6.5–8.1)

## 2023-10-14 LAB — LACTATE DEHYDROGENASE: LDH: 222 U/L — ABNORMAL HIGH (ref 98–192)

## 2023-10-14 MED ORDER — SODIUM CHLORIDE 0.9 % IV SOLN
150.0000 mg | Freq: Once | INTRAVENOUS | Status: AC
  Administered 2023-10-14: 150 mg via INTRAVENOUS
  Filled 2023-10-14: qty 150

## 2023-10-14 MED ORDER — SODIUM CHLORIDE 0.9 % IV SOLN
75.0000 mg/m2 | Freq: Once | INTRAVENOUS | Status: AC
  Administered 2023-10-14: 129 mg via INTRAVENOUS
  Filled 2023-10-14: qty 12.9

## 2023-10-14 MED ORDER — DEXAMETHASONE SODIUM PHOSPHATE 10 MG/ML IJ SOLN
10.0000 mg | Freq: Once | INTRAMUSCULAR | Status: AC
  Administered 2023-10-14: 10 mg via INTRAVENOUS
  Filled 2023-10-14: qty 1

## 2023-10-14 MED ORDER — DIPHENHYDRAMINE HCL 25 MG PO CAPS
50.0000 mg | ORAL_CAPSULE | Freq: Once | ORAL | Status: AC
  Administered 2023-10-14: 50 mg via ORAL
  Filled 2023-10-14: qty 2

## 2023-10-14 MED ORDER — PALONOSETRON HCL INJECTION 0.25 MG/5ML
0.2500 mg | Freq: Once | INTRAVENOUS | Status: AC
  Administered 2023-10-14: 0.25 mg via INTRAVENOUS
  Filled 2023-10-14: qty 5

## 2023-10-14 MED ORDER — HEPARIN SOD (PORK) LOCK FLUSH 100 UNIT/ML IV SOLN
500.0000 [IU] | Freq: Once | INTRAVENOUS | Status: AC | PRN
  Administered 2023-10-14: 500 [IU]

## 2023-10-14 MED ORDER — SODIUM CHLORIDE 0.9% FLUSH
10.0000 mL | INTRAVENOUS | Status: DC | PRN
  Administered 2023-10-14: 10 mL

## 2023-10-14 MED ORDER — ACETAMINOPHEN 325 MG PO TABS
650.0000 mg | ORAL_TABLET | Freq: Once | ORAL | Status: AC
  Administered 2023-10-14: 650 mg via ORAL
  Filled 2023-10-14: qty 2

## 2023-10-14 MED ORDER — TRASTUZUMAB-ANNS CHEMO 150 MG IV SOLR
6.0000 mg/kg | Freq: Once | INTRAVENOUS | Status: AC
  Administered 2023-10-14: 420 mg via INTRAVENOUS
  Filled 2023-10-14: qty 20

## 2023-10-14 MED ORDER — SODIUM CHLORIDE 0.9 % IV SOLN: INTRAVENOUS | Status: DC

## 2023-10-14 MED ORDER — SODIUM CHLORIDE 0.9 % IV SOLN
466.8000 mg | Freq: Once | INTRAVENOUS | Status: AC
  Administered 2023-10-14: 470 mg via INTRAVENOUS
  Filled 2023-10-14: qty 47

## 2023-10-14 NOTE — Patient Instructions (Signed)

## 2023-10-14 NOTE — Progress Notes (Unsigned)
 Patient is here for her last chemotherapy cycle today. She will continue on anti-HER2 therapy and AI. She will need ECHO and bone density prior to starting her maintenance therapy.   Bone density scheduled for ECHO scheduled for

## 2023-10-14 NOTE — Patient Instructions (Signed)
 [facility] - A DEPT OF [facility]  Discharge Instructions: Thank you for choosing [facility] to provide your oncology and hematology care.   If you have a lab appointment with the Cancer Center, please go directly to the Cancer Center and check in at the registration area.  Wear comfortable clothing and clothing appropriate for easy access to any Portacath or PICC line.   We strive to give you quality time with your provider. You may need to reschedule your appointment if you arrive late (15 or more minutes).  Arriving late affects you and other patients whose appointments are after yours.  Also, if you miss three or more appointments without notifying the office, you may be dismissed from the clinic at the provider's discretion.      For prescription refill requests, have your pharmacy contact our office and allow 72 hours for refills to be completed.    Today you received the following chemotherapy and/or immunotherapy agents Kanjinti /Taxotere /Carboplatin       To help prevent nausea and vomiting after your treatment, we encourage you to take your nausea medication as directed.  BELOW ARE SYMPTOMS THAT SHOULD BE REPORTED IMMEDIATELY: *FEVER GREATER THAN 100.4 F (38 C) OR HIGHER *CHILLS OR SWEATING *NAUSEA AND VOMITING THAT IS NOT CONTROLLED WITH YOUR NAUSEA MEDICATION *UNUSUAL SHORTNESS OF BREATH *UNUSUAL BRUISING OR BLEEDING *URINARY PROBLEMS (pain or burning when urinating, or frequent urination) *BOWEL PROBLEMS (unusual diarrhea, constipation, pain near the anus) TENDERNESS IN MOUTH AND THROAT WITH OR WITHOUT PRESENCE OF ULCERS (sore throat, sores in mouth, or a toothache) UNUSUAL RASH, SWELLING OR PAIN  UNUSUAL VAGINAL DISCHARGE OR ITCHING   Items with * indicate a potential emergency and should be followed up as soon as possible or go to the Emergency Department if any problems should occur.  Please show the CHEMOTHERAPY ALERT CARD  or IMMUNOTHERAPY ALERT CARD at check-in to the Emergency Department and triage nurse. Should you have questions after your visit or need to cancel or reschedule your appointment, please contact [facility] - A DEPT OF [facility]  [phone] and follow the prompts.  Office hours are 8:00 a.m. to 4:30 p.m. Monday - Friday. Please note that voicemails left after 4:00 p.m. may not be returned until the following business day.  We are closed weekends and major holidays. You have access to a nurse at all times for urgent questions. Please call the main number to the clinic [phone] and follow the prompts.  For any non-urgent questions, you may also contact your provider using MyChart. We now offer e-Visits for anyone 86 and older to request care online for non-urgent symptoms. For details visit [email or web address].   Also download the MyChart app! Go to the app store, search "MyChart", open the app, select [facility], and log in with your MyChart username and password.

## 2023-10-14 NOTE — Progress Notes (Signed)
 Hematology and Oncology Follow Up Visit  Rhonda Paul [id number] 1950/08/16 73 y.o. 10/14/2023   Principle Diagnosis:  Stage IA (T1cN0M0) invasive ductal carcinoma of the right breast -- ER+/PR-/HER2+ Remote history of stage I-triple negative-cancer of the right breast-2004  Current Therapy:   Status post right mastectomy --07/04/2023 Adjuvant Carbo/Taxotere /Herceptin / --start cycle #3/4 on 08/11/2023     Interim History:  Rhonda Paul is back for follow-up.  She is doing pretty well.  She really has had no complaints.  The nosebleeds are [place] all lot better since she saw ENT.  I think she applies Bactroban to the nasal mucosa.  She has had no problems with cough or shortness of breath.  She has had no issues with bleeding.  She is on Eliquis  from the atrial fibrillation that she has.  She has not had little bit of diarrhea..  She has had no leg swelling.  There is been no rashes.  She did see her dermatologist.  She has had no fever.  Her appetite has been quite good.  She had a very nice Easter.  Overall, I would say that her performance status is probably ECOG 1.     Medications:  Current Outpatient Medications:    apixaban  (ELIQUIS ) 5 MG TABS tablet, Take 1 tablet (5 mg total) by mouth 2 (two) times daily., Disp: 60 tablet, Rfl: 5   atorvastatin  (LIPITOR ) 40 MG tablet, TAKE ONE TABLET BY MOUTH DAILY, Disp: 90 tablet, Rfl: 3   Biotin 5000 MCG CHEW, Chew by mouth., Disp: , Rfl:    Biotin w/ Vitamins C & E (HAIR/SKIN/NAILS PO), Take 1 capsule by mouth daily., Disp: , Rfl:    dapagliflozin  propanediol (FARXIGA ) 10 MG TABS tablet, Take 1 tablet (10 mg total) by mouth daily before breakfast., Disp: 90 tablet, Rfl: 1   dexamethasone  (DECADRON ) 4 MG tablet, Take 2 tabs by mouth 2 times daily starting day before chemo. Then take 2 tabs daily for 2 days starting day after chemo. Take with food., Disp: 30 tablet, Rfl: 1   ezetimibe  (ZETIA ) 10 MG tablet, Take 1 tablet (10 mg total) by mouth  daily., Disp: 90 tablet, Rfl: 3   ipratropium (ATROVENT ) 0.03 % nasal spray, Place 1 spray into both nostrils daily., Disp: , Rfl:    levothyroxine  (SYNTHROID ) 50 MCG tablet, TAKE 1 TABLET BY MOUTH DAILY, Disp: 90 tablet, Rfl: 2   lidocaine -prilocaine  (EMLA ) cream, Apply 1 Application topically as needed. Apply one hour prior to port access and cover with plastic wrap, Disp: 30 g, Rfl: 0   metoprolol  succinate (TOPROL -XL) 50 MG 24 hr tablet, TAKE 1 TABLET BY MOUTH DAILY WITH OR IMMEDIATELY FOLLOWING A MEAL, Disp: 90 tablet, Rfl: 1   mupirocin ointment (BACTROBAN) 2 %, Apply 1 Application topically., Disp: , Rfl:    nitroGLYCERIN  (NITROSTAT ) 0.4 MG SL tablet, Place 1 tablet (0.4 mg total) under the tongue every 5 (five) minutes as needed for chest pain., Disp: 25 tablet, Rfl: 3   NON FORMULARY, Pt uses cpap nightly, Disp: , Rfl:    ondansetron  (ZOFRAN ) 8 MG tablet, Take 1 tablet (8 mg total) by mouth every 8 (eight) hours as needed for nausea or vomiting. Start on the third day after chemotherapy., Disp: 30 tablet, Rfl: 1   PEG 3350-KCl-NaBcb-NaCl-NaSulf (GOLYTELY PO), See admin instructions., Disp: , Rfl:    prochlorperazine  (COMPAZINE ) 10 MG tablet, Take 1 tablet (10 mg total) by mouth every 6 (six) hours as needed for nausea or vomiting., Disp: 30 tablet,  Rfl: 1  Allergies:  Allergies  Allergen Reactions   Lambs Quarters Hives, Itching and Nausea And Vomiting   Entresto  [Sacubitril -Valsartan ]     Low BP   Lanolin Acid [Lanolin] Rash   Other Itching and Rash    wool    Past Medical History, Surgical history, Social history, and Family History were reviewed and updated.  Review of Systems: Review of Systems  Constitutional: Negative.   HENT:  Negative.    Eyes: Negative.   Respiratory: Negative.    Cardiovascular: Negative.   Gastrointestinal: Negative.   Endocrine: Negative.   Genitourinary: Negative.  Negative for difficulty urinating.   Musculoskeletal: Negative.   Skin:  Negative.   Neurological: Negative.   Hematological: Negative.   Psychiatric/Behavioral: Negative.      Physical Exam:  height is 5\' 5"  (1.651 m) and weight is 143 lb (64.9 kg). Her blood pressure is 94/54 (abnormal) and her pulse is 84. Her respiration is 17.   Wt Readings from Last 3 Encounters:  10/14/23 143 lb (64.9 kg)  09/23/23 143 lb (64.9 kg)  09/22/23 142 lb (64.4 kg)    Physical Exam Vitals reviewed.  Constitutional:      Comments: Breast exam shows left breast no masses, edema or erythema.  There is no left axillary adenopathy.  Right chest wall shows will healing mastectomy.  There is no erythema or warmth.  There is no right axillary adenopathy.  HENT:     Head: Normocephalic and atraumatic.  Eyes:     Pupils: Pupils are equal, round, and reactive to light.  Cardiovascular:     Rate and Rhythm: Normal rate and regular rhythm.     Heart sounds: Normal heart sounds.  Pulmonary:     Effort: Pulmonary effort is normal.     Breath sounds: Normal breath sounds.  Abdominal:     General: Bowel sounds are normal.     Palpations: Abdomen is soft.  Musculoskeletal:        General: No tenderness or deformity. Normal range of motion.     Cervical back: Normal range of motion.  Lymphadenopathy:     Cervical: No cervical adenopathy.  Skin:    General: Skin is warm and dry.     Findings: No erythema or rash.  Neurological:     Mental Status: She is alert and oriented to person, place, and time.  Psychiatric:        Behavior: Behavior normal.        Thought Content: Thought content normal.        Judgment: Judgment normal.      Lab Results  Component Value Date   WBC 16.6 (H) 10/14/2023   HGB 9.5 (L) 10/14/2023   HCT 28.2 (L) 10/14/2023   MCV 105.2 (H) 10/14/2023   PLT 240 10/14/2023     Chemistry      Component Value Date/Time   NA 136 09/23/2023 0940   NA 141 03/24/2023 0832   NA 142 01/30/2017 0847   NA 140 02/01/2016 0841   K 4.2 09/23/2023 0940   K  4.9 (H) 01/30/2017 0847   K 4.8 02/01/2016 0841   CL 102 09/23/2023 0940   CL 104 01/30/2017 0847   CO2 24 09/23/2023 0940   CO2 32 01/30/2017 0847   CO2 27 02/01/2016 0841   BUN 11 09/23/2023 0940   BUN 9 03/24/2023 0832   BUN 11 01/30/2017 0847   BUN 12.7 02/01/2016 0841   CREATININE 0.78 09/23/2023 0940  CREATININE 1.0 01/30/2017 0847   CREATININE 0.8 02/01/2016 0841      Component Value Date/Time   CALCIUM  9.2 09/23/2023 0940   CALCIUM  9.6 01/30/2017 0847   CALCIUM  10.0 02/01/2016 0841   ALKPHOS 58 09/23/2023 0940   ALKPHOS 51 01/30/2017 0847   ALKPHOS 59 02/01/2016 0841   AST 18 09/23/2023 0940   AST 22 02/01/2016 0841   ALT 23 09/23/2023 0940   ALT 25 01/30/2017 0847   ALT 25 02/01/2016 0841   BILITOT 0.7 09/23/2023 0940   BILITOT 0.93 02/01/2016 0841      Impression and Plan: Ms. Wolter is a very nice 73 year old white female.  She had a remote history of early-stage breast cancer of the right breast.  She had adjuvant chemotherapy for that.    Now, she has a new breast cancer in the right breast.  This cancer is HER2 positive.  We will subsequently go ahead and treat her with anti-HER2 therapy with the chemotherapy in the adjuvant setting.  She has stage I disease.  Thankfully, she had a negative lymph node.  She will finish of her adjuvant chemotherapy today.  We will then have her come back in about a month or so.  She will then be put on Kadcyla.  Since she is ER positive, we will go ahead and get her off Femara.  She probably needs to have a bone density test done.  She also needs to have an echocardiogram.  And we will make sure these are all set up.    Ivor Mars, MD 5/6/202510:29 AM

## 2023-10-16 ENCOUNTER — Encounter: Payer: Self-pay | Admitting: Hematology & Oncology

## 2023-10-16 ENCOUNTER — Inpatient Hospital Stay

## 2023-10-16 VITALS — BP 94/56 | HR 71 | Temp 98.0°F | Resp 16

## 2023-10-16 DIAGNOSIS — Z5112 Encounter for antineoplastic immunotherapy: Secondary | ICD-10-CM | POA: Diagnosis not present

## 2023-10-16 DIAGNOSIS — C50911 Malignant neoplasm of unspecified site of right female breast: Secondary | ICD-10-CM

## 2023-10-16 MED ORDER — PEGFILGRASTIM-JMDB 6 MG/0.6ML ~~LOC~~ SOSY
6.0000 mg | PREFILLED_SYRINGE | Freq: Once | SUBCUTANEOUS | Status: AC
  Administered 2023-10-16: 6 mg via SUBCUTANEOUS
  Filled 2023-10-16: qty 0.6

## 2023-10-16 NOTE — Patient Instructions (Signed)

## 2023-10-22 ENCOUNTER — Telehealth: Payer: Self-pay | Admitting: Genetic Counselor

## 2023-10-22 NOTE — Telephone Encounter (Signed)
 Patient called to cancel genetic counseling appt.  She does not wish to reschedule at this time.

## 2023-10-23 ENCOUNTER — Inpatient Hospital Stay: Payer: Medicare HMO | Admitting: Genetic Counselor

## 2023-10-23 ENCOUNTER — Inpatient Hospital Stay: Payer: Medicare HMO

## 2023-10-24 DIAGNOSIS — G4733 Obstructive sleep apnea (adult) (pediatric): Secondary | ICD-10-CM | POA: Diagnosis not present

## 2023-10-31 DIAGNOSIS — Z1379 Encounter for other screening for genetic and chromosomal anomalies: Secondary | ICD-10-CM | POA: Diagnosis not present

## 2023-11-07 ENCOUNTER — Other Ambulatory Visit: Payer: Self-pay | Admitting: Family Medicine

## 2023-11-07 ENCOUNTER — Ambulatory Visit (HOSPITAL_BASED_OUTPATIENT_CLINIC_OR_DEPARTMENT_OTHER): Admitting: Family

## 2023-11-07 ENCOUNTER — Encounter (HOSPITAL_BASED_OUTPATIENT_CLINIC_OR_DEPARTMENT_OTHER): Payer: Self-pay | Admitting: Family

## 2023-11-07 VITALS — BP 80/60 | HR 84 | Ht 64.5 in | Wt 141.0 lb

## 2023-11-07 DIAGNOSIS — I2510 Atherosclerotic heart disease of native coronary artery without angina pectoris: Secondary | ICD-10-CM | POA: Diagnosis not present

## 2023-11-07 DIAGNOSIS — I4821 Permanent atrial fibrillation: Secondary | ICD-10-CM | POA: Diagnosis not present

## 2023-11-07 DIAGNOSIS — E785 Hyperlipidemia, unspecified: Secondary | ICD-10-CM

## 2023-11-07 DIAGNOSIS — D6859 Other primary thrombophilia: Secondary | ICD-10-CM | POA: Diagnosis not present

## 2023-11-07 DIAGNOSIS — Z780 Asymptomatic menopausal state: Secondary | ICD-10-CM

## 2023-11-07 DIAGNOSIS — Z853 Personal history of malignant neoplasm of breast: Secondary | ICD-10-CM

## 2023-11-07 NOTE — Progress Notes (Signed)
 Cardiology Office Note   Date:  11/07/2023  ID:  Rhonda Paul, DOB 06/19/1950, MRN [id number] PCP: Perley Bradley, MD  [facility] Providers Cardiologist:  Sheryle Donning, MD     History of Present Illness Rhonda Paul is a 73 y.o. 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   Last seen 03/2023 with occasional lightheadedness with position changes. It was noted if symptomatic with hypotension, to consider stopping Farxiga .   Since last seen she was diagnosed with breast cancer and recently completed chemotherapy. She had right simple mastectomy 07/04/23. Echo upcoming 11/11/23 per Dr. Maria Shiner. Notes since her last chemo treatment her BP has not increased as it usually does. Often about a week after finishing chemo BP will return to her baseline 90-100s/60s. However, BP at home persistently 70-80s/50s. She does not reports dizziness/lightheadedness but does not generalized fatigue.   ROS: Please see the history of present illness.    All other systems reviewed and are negative.   Studies Reviewed EKG Interpretation Date/Time:  Friday Nov 07 2023 15:57:19 EDT Ventricular Rate:  84 PR Interval:    QRS Duration:  90 QT Interval:  384 QTC Calculation: 453 R Axis:   -58  Text Interpretation: Atrial fibrillation Left axis deviation Low voltage QRS  No acute ST/T wave changes Confirmed by Neomi Banks ([id number]) on 11/07/2023 4:02:16 PM    Cardiac Studies & Procedures   ______________________________________________________________________________________________ CARDIAC CATHETERIZATION  CARDIAC CATHETERIZATION 01/16/2015  Conclusion 1. 1st Mrg lesion, 75% stenosed. 2. Prox RCA lesion, 30% stenosed. 3. The left ventricular systolic function is normal.   Moderately severe first obtuse marginal stenosis. The marginal is a relatively small  vessel in distribution and diameter. This likely represents the culprit for the patient's presentation.  30% proximal RCA narrowing with more severe narrowing on catheter engagement due to spasm. The spasm was relieved with intracoronary nitroglycerin .  Widely patent LAD  Normal LV function  Recommendations:   Sublingual nitroglycerin  for chest discomfort  Consider low-dose long-acting nitrates if recurring episodes of chest discomfort.  Eligible for discharge in a.m. assuming no complications.  Findings Coronary Findings Diagnostic  Dominance: Co-dominant  Left Anterior Descending  First Diagonal Branch The vessel is small in size.  Second Diagonal Branch The vessel is small in size.  Left Circumflex  First Obtuse Marginal Branch The vessel is small in size. eccentric .  Second Obtuse Marginal Branch The vessel is small in size.  Right Coronary Artery The lesion is type C .  Intervention  No interventions have been documented.   STRESS TESTS  NM PET CT CARDIAC PERFUSION MULTI W/ABSOLUTE BLOODFLOW 10/08/2022  Narrative   The study demonstrates normal perfusion. The study is intermediate risk due to abnormal myocardial blood flow reserve of 1.6 and no augmentation of EF with stress. These findings may represent multivessel CAD vs microvascular disease. Coronary calcifications noted in the proximal LAD and RCA.   LV perfusion is normal. There is no evidence of ischemia. There is no evidence of infarction.   Rest left ventricular function is normal. Rest EF: 58 %. Stress left ventricular function is normal. Stress EF: 58 %. EF visually appears preserved, however calculation may be affected by atrial fibrillation. End diastolic cavity size is normal. End systolic cavity size is normal. No evidence of transient ischemic dilation (TID) noted (TID 1.07).   Myocardial blood flow was computed to be 0.71ml/g/min at  rest and 1.45ml/g/min at stress. Global myocardial blood  flow reserve was 1.63 and was abnormal.   Coronary calcium  was present on the attenuation correction CT images. Moderate coronary calcifications were present. Coronary calcifications were present in the left anterior descending artery and right coronary artery distribution(s). Mild aortic valve calcifications.   Electronically signed by: Euell Herrlich, MD  CLINICAL DATA:  This over-read does not include interpretation of cardiac or coronary anatomy or pathology. The cardiac PET-CT interpretation by the cardiologist is attached.  COMPARISON:  None Available.  FINDINGS: No suspicious nodules, masses, or infiltrates are identified in the visualized portion of the lungs. Mild scarring noted in anterior upper lobe and left lower lobe. No pleural fluid seen.  The visualized portions of the mediastinum and chest wall are unremarkable.  IMPRESSION: No significant non-cardiac abnormality identified.   Electronically Signed By: Marlyce Sine M.D. On: 10/08/2022 10:37   ECHOCARDIOGRAM  ECHOCARDIOGRAM COMPLETE 05/30/2023  Narrative ECHOCARDIOGRAM REPORT    Patient Name:   Rhonda Paul Date of Exam: 05/30/2023 Medical Rec #:  [id number]        Height:       65.0 in Accession #:    [id number]       Weight:       143.8 lb Date of Birth:  [birth year]        BSA:          1.719 m Patient Age:    72 years         BP:           108/64 mmHg Patient Gender: F                HR:           63 bpm. Exam Location:  [address]  Procedure: 2D Echo, 3D Echo, Cardiac Doppler, Color Doppler and Strain Analysis  Indications:    I48.91 Atrial Fibrillation  History:        Patient has prior history of Echocardiogram examinations, most recent 10/03/2022. Previous Myocardial Infarction, Stroke, Arrythmias:Atrial Fibrillation; Risk Factors:Family History of Coronary Artery Disease and Hypertension. Right Breast Cancer status post Lumpectomy with Chemotherapy and Radiation (2003), now recurring  and Pre-Operative Eval for Right Mastectomy.  Sonographer:    Ewing Holiday RDCS Referring Phys: Sherryll Donald ENNEVER  IMPRESSIONS   1. Left ventricular ejection fraction, by estimation, is 60 to 65%. The left ventricle has normal function. The left ventricle has no regional wall motion abnormalities. Left ventricular diastolic function could not be evaluated. The average left ventricular global longitudinal strain is -19.2 %. The global longitudinal strain is normal. 2. Right ventricular systolic function is normal. The right ventricular size is normal. There is mildly elevated pulmonary artery systolic pressure. The estimated right ventricular systolic pressure is 40.3 mmHg. 3. Left atrial size was severely dilated. 4. The mitral valve is abnormal. Mild to moderate mitral valve regurgitation. 5. Tricuspid valve regurgitation is mild to moderate. 6. The aortic valve is tricuspid. Aortic valve regurgitation is not visualized. 7. The inferior vena cava is normal in size with <50% respiratory variability, suggesting right atrial pressure of 8 mmHg.  Comparison(s): Changes from prior study are noted. 10/03/2022: LVEF 50-55%.  FINDINGS Left Ventricle: Left ventricular ejection fraction, by estimation, is 60 to 65%. The left ventricle has normal function. The left ventricle has no regional wall motion abnormalities. The average left ventricular global longitudinal strain is -19.2 %. The global longitudinal strain is normal. The left ventricular  internal cavity size was normal in size. There is no left ventricular hypertrophy. Left ventricular diastolic function could not be evaluated due to atrial fibrillation. Left ventricular diastolic function could not be evaluated.  Right Ventricle: The right ventricular size is normal. No increase in right ventricular wall thickness. Right ventricular systolic function is normal. There is mildly elevated pulmonary artery systolic pressure. The tricuspid  regurgitant velocity is 2.84 m/s, and with an assumed right atrial pressure of 8 mmHg, the estimated right ventricular systolic pressure is 40.3 mmHg.  Left Atrium: Left atrial size was severely dilated.  Right Atrium: Right atrial size was normal in size.  Pericardium: There is no evidence of pericardial effusion.  Mitral Valve: The mitral valve is abnormal. Mild to moderate mitral valve regurgitation.  Tricuspid Valve: The tricuspid valve is grossly normal. Tricuspid valve regurgitation is mild to moderate.  Aortic Valve: The aortic valve is tricuspid. Aortic valve regurgitation is not visualized.  Pulmonic Valve: The pulmonic valve was normal in structure. Pulmonic valve regurgitation is not visualized.  Aorta: The aortic root and ascending aorta are structurally normal, with no evidence of dilitation.  Venous: The inferior vena cava is normal in size with less than 50% respiratory variability, suggesting right atrial pressure of 8 mmHg.  IAS/Shunts: No atrial level shunt detected by color flow Doppler.   LEFT VENTRICLE PLAX 2D LVIDd:         4.10 cm   Diastology LVIDs:         2.90 cm   LV e' medial:    9.90 cm/s LV PW:         0.90 cm   LV E/e' medial:  11.4 LV IVS:        0.70 cm   LV e' lateral:   13.40 cm/s LVOT diam:     2.20 cm   LV E/e' lateral: 8.4 LV SV:         62 LV SV Index:   36        2D Longitudinal Strain LVOT Area:     3.80 cm  2D Strain GLS (A2C):   -20.8 % 2D Strain GLS (A3C):   -18.4 % 2D Strain GLS (A4C):   -18.3 % 2D Strain GLS Avg:     -19.2 %  3D Volume EF: 3D EF:        66 % LV EDV:       93 ml LV ESV:       32 ml LV SV:        61 ml  RIGHT VENTRICLE RV Basal diam:  3.10 cm RV S prime:     15.10 cm/s TAPSE (M-mode): 1.6 cm RVSP:           35.3 mmHg  LEFT ATRIUM             Index        RIGHT ATRIUM           Index LA diam:        4.50 cm 2.62 cm/m   RA Pressure: 3.00 mmHg LA Vol (A2C):   96.2 ml 55.95 ml/m  RA Area:     15.00 cm LA  Vol (A4C):   99.7 ml 57.99 ml/m  RA Volume:   33.30 ml  19.37 ml/m LA Biplane Vol: 98.8 ml 57.46 ml/m AORTIC VALVE LVOT Vmax:   76.80 cm/s LVOT Vmean:  52.400 cm/s LVOT VTI:    0.162 m  AORTA Ao Root diam: 2.70 cm  Ao Asc diam:  2.80 cm  MITRAL VALVE                TRICUSPID VALVE MV Area (PHT   cm          TR Peak grad:   32.3 mmHg MV Decel Time: 180 msec     TR Vmax:        284.00 cm/s MR Peak grad: 98.8 mmHg     Estimated RAP:  3.00 mmHg MR Mean grad: 64.0 mmHg     RVSP:           35.3 mmHg MR Vmax:      497.00 cm/s MR Vmean:     379.0 cm/s    SHUNTS MV E velocity: 112.50 cm/s  Systemic VTI:  0.16 m Systemic Diam: 2.20 cm  Dinah Franco MD Electronically signed by Dinah Franco MD Signature Date/Time: 05/30/2023/2:53:01 PM    Final   TEE  ECHO TEE 04/18/2022  Narrative TRANSESOPHOGEAL ECHO REPORT    Patient Name:   Rhonda Paul Date of Exam: 04/18/2022 Medical Rec #:  [id number]        Height:       64.5 in Accession #:    [id number]       Weight:       151.5 lb Date of Birth:  18-Mar-1951        BSA:          1.748 m Patient Age:    71 years         BP:           120/72 mmHg Patient Gender: F                HR:           79 bpm. Exam Location:  Inpatient  Procedure: 3D Echo, Transesophageal Echo, Cardiac Doppler and Color Doppler  Indications:     I34.0 Nonrheumatic mitral (valve) insufficiency  History:         Patient has prior history of Echocardiogram examinations, most recent 04/10/2022. CAD and Previous Myocardial Infarction, Abnormal ECG, Stroke, Mitral Valve Disease; Risk Factors:Dyslipidemia.  Sonographer:     Raynelle Callow RDCS Referring Phys:  [id number] [facility] A Paulita Boss Diagnosing Phys: Gloriann Larger MD  PROCEDURE: After discussion of the risks and benefits of a TEE, an informed consent was obtained from the patient. The transesophogeal probe was passed without difficulty through the esophogus of the patient. Imaged were obtained with  the patient in a left lateral decubitus position. Sedation performed by different physician. The patient was monitored while under deep sedation. Anesthestetic sedation was provided intravenously by Anesthesiology: 400mg  of Propofol , 100mg  of Lidocaine . The patient's vital signs; including heart rate, blood pressure, and oxygen saturation; remained stable throughout the procedure. The patient developed no complications during the procedure.  IMPRESSIONS   1. Likely moderate mitral regurgitation. Mild anterior mitral valve prolapse There are multiple jets; functional MR. Pulmonary vein flow blunting without reversal. No significant blood pressure variability. Mitral valve anatomy is suitable for interventions. Cumulative vena contraca area ~ 0.42 (moderate to severe). The mitral valve is abnormal. Moderate mitral valve regurgitation. No evidence of mitral stenosis. 2. Left ventricular ejection fraction, by estimation, is 45 to 50%. Left ventricular ejection fraction by 3D volume is 49 %. The left ventricle has mildly decreased function. 3. Right ventricular systolic function is normal. The right ventricular size is normal. 4. Left atrial size was mildly dilated. No left atrial/left atrial appendage  thrombus was detected. 5. Right atrial size was mildly dilated. 6. Tricuspid valve regurgitation is mild to moderate. 7. The aortic valve is tricuspid. Aortic valve regurgitation is not visualized. No aortic stenosis is present. 8. Cannot exclude a small PFO. Agitated saline contrast bubble study was positive with shunting observed within 3-6 cardiac cycles suggestive of interatrial shunt. This was small and only occured with Valsalva.  FINDINGS Left Ventricle: Left ventricular ejection fraction, by estimation, is 45 to 50%. Left ventricular ejection fraction by 3D volume is 49 %. The left ventricle has mildly decreased function. The left ventricular internal cavity size was normal in size.  Right  Ventricle: The right ventricular size is normal. No increase in right ventricular wall thickness. Right ventricular systolic function is normal.  Left Atrium: Left atrial size was mildly dilated. No left atrial/left atrial appendage thrombus was detected.  Right Atrium: Right atrial size was mildly dilated.  Pericardium: There is no evidence of pericardial effusion.  Mitral Valve: Likely moderate mitral regurgitation. Mild anterior mitral valve prolapse There are multiple jets; functional MR. Pulmonary vein flow blunting without reversal. No significant blood pressure variability. Mitral valve anatomy is suitable for interventions. Cumulative vena contraca area ~ 0.42 (moderate to severe). The mitral valve is abnormal. Moderate mitral valve regurgitation. No evidence of mitral valve stenosis. MV peak gradient, 6.1 mmHg. The mean mitral valve gradient is 2.0 mmHg.  Tricuspid Valve: The tricuspid valve is normal in structure. Tricuspid valve regurgitation is mild to moderate. No evidence of tricuspid stenosis.  Aortic Valve: The aortic valve is tricuspid. Aortic valve regurgitation is not visualized. No aortic stenosis is present.  Pulmonic Valve: The pulmonic valve was grossly normal. Pulmonic valve regurgitation is mild. No evidence of pulmonic stenosis.  Aorta: The aortic root and ascending aorta are structurally normal, with no evidence of dilitation.  IAS/Shunts: Cannot exclude a small PFO. Agitated saline contrast bubble study was positive with shunting observed within 3-6 cardiac cycles suggestive of interatrial shunt.  Additional Comments: Spectral Doppler performed.   3D Volume EF LV 3D EF:    Left ventricular ejection fraction by 3D volume is 49 %. LV 3D EDV:   44.11 ml LV 3D ESV:   22.45 ml  3D Volume EF LV 3D EF:    65.10 % LV 3D EDV:   36100.00 mm LV 3D ESV:   12600.00 mm LV 3D SV:    23500.00 mm  3D Volume EF: 3D EF:        49 %  AORTIC VALVE LVOT Vmax:   72.10  cm/s LVOT Vmean:  51.300 cm/s LVOT VTI:    0.157 m  MITRAL VALVE MV Peak grad: 6.1 mmHg  SHUNTS MV Mean grad: 2.0 mmHg  Systemic VTI: 0.16 m MV Vmax:      1.23 m/s MV Vmean:     68.9 cm/s  Gloriann Larger MD Electronically signed by Gloriann Larger MD Signature Date/Time: 04/18/2022/5:59:49 PM    Final        ______________________________________________________________________________________________      Risk Assessment/Calculations  CHA2DS2-VASc Score = 6   This indicates a 9.7% annual risk of stroke. The patient's score is based upon: CHF History: 1 HTN History: 0 Diabetes History: 0 Stroke History: 2 Vascular Disease History: 1 Age Score: 1 Gender Score: 1            Physical Exam VS:  BP (!) 80/60   Pulse 84   Ht 5' 4.5" (1.638 m)   Wt 141 lb (64  kg)   LMP 11/08/2001 (Approximate)   SpO2 95%   BMI 23.83 kg/m    Wt Readings from Last 3 Encounters:  11/07/23 141 lb (64 kg)  10/14/23 143 lb (64.9 kg)  09/23/23 143 lb (64.9 kg)    GEN: Well nourished, well developed in no acute distress NECK: No JVD; No carotid bruits CARDIAC: IRIR, no murmurs, rubs, gallops RESPIRATORY:  Clear to auscultation without rales, wheezing or rhonchi  ABDOMEN: Soft, non-tender, non-distended EXTREMITIES:  No edema; No deformity   ASSESSMENT AND PLAN  NICM / HFrEF with recovered LVEF - Euvolemic and well compensated on exam. Echo upcoming per Dr. Maria Shiner for monitoring due to completion of chemotherapy. Previously did not tolerate Entresto . Given persistent hypotension, discontinue Farxiga . Continue Toprol  50mg  daily.  Permanent atrial fib - rate controlled. Continue toprol  50mg  daily. CHA2DS2-VASc Score = 6 [CHF History: 1, HTN History: 0, Diabetes History: 0, Stroke History: 2, Vascular Disease History: 1, Age Score: 1, Gender Score: 1].  Therefore, the patient's annual risk of stroke is 9.7 %.    Continue Eliquis  5mg  BID.   Breast cancer - follows with Dr.  Maria Shiner.   Nonobstructive CAD / HLD, LDL goal <70 - continue atorvastatin , zetia . No asa due to OAC. Recommend aiming for 150 minutes of moderate intensity activity per week and following a heart healthy diet.         Dispo: follow up in September with Dr. Veryl Gottron  Signed, Clearnce Curia, NP

## 2023-11-07 NOTE — Patient Instructions (Addendum)
 Medication Instructions:  STOP Farxiga   *If you need a refill on your cardiac medications before your next appointment, please call your pharmacy*  Follow-Up: At [facility], you and your health needs are our priority.  As part of our continuing mission to provide you with exceptional heart care, our providers are all part of one team.  This team includes your primary Cardiologist (physician) and Advanced Practice Providers or APPs (Physician Assistants and Nurse Practitioners) who all work together to provide you with the care you need, when you need it.  Your next appointment:   September 2025  Provider:   Sheryle Donning, MD or Neomi Banks, NP    We recommend signing up for the patient portal called "MyChart".  Sign up information is provided on this After Visit Summary.  MyChart is used to connect with patients for Virtual Visits (Telemedicine).  Patients are able to view lab/test results, encounter notes, upcoming appointments, etc.  Non-urgent messages can be sent to your provider as well.   To learn more about what you can do with MyChart, go to [email or web address].   Other Instructions  My will send a MyChart message in a couple week to check in

## 2023-11-08 ENCOUNTER — Other Ambulatory Visit: Payer: Self-pay

## 2023-11-11 ENCOUNTER — Ambulatory Visit (HOSPITAL_BASED_OUTPATIENT_CLINIC_OR_DEPARTMENT_OTHER)
Admission: RE | Admit: 2023-11-11 | Discharge: 2023-11-11 | Disposition: A | Discharge status: Home / Self Care | Source: Ambulatory Visit | Attending: Hematology & Oncology | Admitting: Hematology & Oncology

## 2023-11-11 DIAGNOSIS — Z8673 Personal history of transient ischemic attack (TIA), and cerebral infarction without residual deficits: Secondary | ICD-10-CM | POA: Insufficient documentation

## 2023-11-11 DIAGNOSIS — E785 Hyperlipidemia, unspecified: Secondary | ICD-10-CM | POA: Diagnosis not present

## 2023-11-11 DIAGNOSIS — I340 Nonrheumatic mitral (valve) insufficiency: Secondary | ICD-10-CM | POA: Insufficient documentation

## 2023-11-11 DIAGNOSIS — I10 Essential (primary) hypertension: Secondary | ICD-10-CM | POA: Diagnosis not present

## 2023-11-11 DIAGNOSIS — I4891 Unspecified atrial fibrillation: Secondary | ICD-10-CM | POA: Diagnosis not present

## 2023-11-11 DIAGNOSIS — Z0189 Encounter for other specified special examinations: Secondary | ICD-10-CM

## 2023-11-11 DIAGNOSIS — C50911 Malignant neoplasm of unspecified site of right female breast: Secondary | ICD-10-CM | POA: Insufficient documentation

## 2023-11-11 DIAGNOSIS — Z170 Estrogen receptor positive status [ER+]: Secondary | ICD-10-CM | POA: Insufficient documentation

## 2023-11-11 DIAGNOSIS — I252 Old myocardial infarction: Secondary | ICD-10-CM | POA: Diagnosis not present

## 2023-11-11 DIAGNOSIS — Z01818 Encounter for other preprocedural examination: Secondary | ICD-10-CM | POA: Insufficient documentation

## 2023-11-11 DIAGNOSIS — I2510 Atherosclerotic heart disease of native coronary artery without angina pectoris: Secondary | ICD-10-CM | POA: Diagnosis not present

## 2023-11-11 LAB — ECHOCARDIOGRAM LIMITED
Area-P 1/2: 7.44 cm2
Calc EF: 57.4 %
MV M vel: 4.26 m/s
MV Peak grad: 72.4 mmHg
MV Vena cont: 0.2 cm
Radius: 0.4 cm
S' Lateral: 2.8 cm
Single Plane A2C EF: 55.1 %
Single Plane A4C EF: 55.1 %

## 2023-11-17 ENCOUNTER — Inpatient Hospital Stay: Attending: Hematology & Oncology

## 2023-11-17 ENCOUNTER — Encounter: Payer: Self-pay | Admitting: Hematology & Oncology

## 2023-11-17 ENCOUNTER — Inpatient Hospital Stay

## 2023-11-17 ENCOUNTER — Encounter (HOSPITAL_BASED_OUTPATIENT_CLINIC_OR_DEPARTMENT_OTHER): Payer: Self-pay

## 2023-11-17 ENCOUNTER — Inpatient Hospital Stay: Admitting: Hematology & Oncology

## 2023-11-17 VITALS — BP 83/54 | HR 69 | Temp 98.2°F | Resp 20 | Ht 64.5 in | Wt 151.1 lb

## 2023-11-17 DIAGNOSIS — Z1731 Human epidermal growth factor receptor 2 positive status: Secondary | ICD-10-CM | POA: Diagnosis not present

## 2023-11-17 DIAGNOSIS — C50911 Malignant neoplasm of unspecified site of right female breast: Secondary | ICD-10-CM

## 2023-11-17 DIAGNOSIS — Z170 Estrogen receptor positive status [ER+]: Secondary | ICD-10-CM | POA: Insufficient documentation

## 2023-11-17 DIAGNOSIS — C50811 Malignant neoplasm of overlapping sites of right female breast: Secondary | ICD-10-CM | POA: Insufficient documentation

## 2023-11-17 DIAGNOSIS — Z5112 Encounter for antineoplastic immunotherapy: Secondary | ICD-10-CM | POA: Diagnosis present

## 2023-11-17 LAB — CMP (CANCER CENTER ONLY)
ALT: 12 U/L (ref 0–44)
AST: 18 U/L (ref 15–41)
Albumin: 3.2 g/dL — ABNORMAL LOW (ref 3.5–5.0)
Alkaline Phosphatase: 38 U/L (ref 38–126)
Anion gap: 7 (ref 5–15)
BUN: 7 mg/dL — ABNORMAL LOW (ref 8–23)
CO2: 27 mmol/L (ref 22–32)
Calcium: 8 mg/dL — ABNORMAL LOW (ref 8.9–10.3)
Chloride: 106 mmol/L (ref 98–111)
Creatinine: 0.7 mg/dL (ref 0.44–1.00)
GFR, Estimated: >60 mL/min (ref >60)
Glucose, Bld: 131 mg/dL — ABNORMAL HIGH (ref 70–99)
Potassium: 3.5 mmol/L (ref 3.5–5.1)
Sodium: 140 mmol/L (ref 135–145)
Total Bilirubin: 1.2 mg/dL (ref 0.0–1.2)
Total Protein: 5.2 g/dL — ABNORMAL LOW (ref 6.5–8.1)

## 2023-11-17 LAB — CBC WITH DIFFERENTIAL (CANCER CENTER ONLY)
Abs Immature Granulocytes: 0.02 10*3/uL (ref 0.00–0.07)
Basophils Absolute: 0.1 10*3/uL (ref 0.0–0.1)
Basophils Relative: 1 %
Eosinophils Absolute: 0.1 10*3/uL (ref 0.0–0.5)
Eosinophils Relative: 2 %
HCT: 28.8 % — ABNORMAL LOW (ref 36.0–46.0)
Hemoglobin: 9.6 g/dL — ABNORMAL LOW (ref 12.0–15.0)
Immature Granulocytes: 0 %
Lymphocytes Relative: 30 %
Lymphs Abs: 1.4 10*3/uL (ref 0.7–4.0)
MCH: 38.2 pg — ABNORMAL HIGH (ref 26.0–34.0)
MCHC: 33.3 g/dL (ref 30.0–36.0)
MCV: 114.7 fL — ABNORMAL HIGH (ref 80.0–100.0)
Monocytes Absolute: 0.6 10*3/uL (ref 0.1–1.0)
Monocytes Relative: 12 %
Neutro Abs: 2.5 10*3/uL (ref 1.7–7.7)
Neutrophils Relative %: 55 %
Platelet Count: 245 10*3/uL (ref 150–400)
RBC: 2.51 MIL/uL — ABNORMAL LOW (ref 3.87–5.11)
RDW: 17.5 % — ABNORMAL HIGH (ref 11.5–15.5)
Smear Review: NORMAL
WBC Count: 4.6 10*3/uL (ref 4.0–10.5)
nRBC: 0 % (ref 0.0–0.2)

## 2023-11-17 MED ORDER — HEPARIN SOD (PORK) LOCK FLUSH 100 UNIT/ML IV SOLN
500.0000 [IU] | Freq: Once | INTRAVENOUS | Status: AC
  Administered 2023-11-17: 500 [IU] via INTRAVENOUS

## 2023-11-17 MED ORDER — LETROZOLE 2.5 MG PO TABS: 2.5000 mg | ORAL_TABLET | Freq: Every day | ORAL | 12 refills | Status: AC

## 2023-11-17 MED ORDER — SODIUM CHLORIDE 0.9% FLUSH
10.0000 mL | Freq: Once | INTRAVENOUS | Status: AC
  Administered 2023-11-17: 10 mL via INTRAVENOUS

## 2023-11-17 NOTE — Addendum Note (Signed)
 Addended by: Chioma Mukherjee E on: 11/17/2023 10:35 AM   Modules accepted: Orders

## 2023-11-17 NOTE — Progress Notes (Signed)
 Hematology and Oncology Follow Up Visit  Rhonda Paul [id number] [birth year] 73 y.o. 11/17/2023   Principle Diagnosis:  Stage IA ([id number]) invasive ductal carcinoma of the right breast -- ER+/PR-/HER2+ Remote history of stage I-triple negative-cancer of the right breast-2004  Current Therapy:   Status post right mastectomy --07/04/2023 Adjuvant Carbo/Taxotere /Herceptin / --s/pcycle #4/4 -- start on 08/11/2023 Kadcyla 3.6 mg/m2 IV q 3 weeks -- start on 11/24/2023 Femara 2.5 mg po q day -- start on 11/18/2023     Interim History:  Rhonda Paul is back for follow-up.  So far, she is doing pretty well.  She was had no complaints since we last saw her.  The only issue that we might have is that she had an echocardiogram.  This was done on 11/11/2023.  The echocardiogram showed a left ventricular ejection fraction of 50-55%.  This is down from 60% that she had prior to adjuvant therapy.  I know that she does have the thing atrial fibrillation.  I know she is on medications for all this.  I still think that we are okay to have her on Kadcyla for her breast cancer in the adjuvant setting.  We will have to watch her echocardiogram quite closely.  She has had no headache.  She has had no nausea or vomiting.  There is been no change in bowel or bladder habits.  She has had no leg swelling.  There is been no rashes.  She has had no bleeding.  She has had no mouth sores.  Overall, I would have to say that her performance status is ECOG 1     Medications:  Current Outpatient Medications:    apixaban  (ELIQUIS ) 5 MG TABS tablet, Take 1 tablet (5 mg total) by mouth 2 (two) times daily., Disp: 60 tablet, Rfl: 5   atorvastatin  (LIPITOR ) 40 MG tablet, TAKE ONE TABLET BY MOUTH DAILY, Disp: 90 tablet, Rfl: 3   Biotin w/ Vitamins C & E (HAIR/SKIN/NAILS PO), Take 1 capsule by mouth daily., Disp: , Rfl:    dexamethasone  (DECADRON ) 4 MG tablet, Take 2 tabs by mouth 2 times daily starting day before chemo. Then take  2 tabs daily for 2 days starting day after chemo. Take with food., Disp: 30 tablet, Rfl: 1   ezetimibe  (ZETIA ) 10 MG tablet, Take 1 tablet (10 mg total) by mouth daily., Disp: 90 tablet, Rfl: 3   levothyroxine  (SYNTHROID ) 50 MCG tablet, TAKE 1 TABLET BY MOUTH DAILY, Disp: 90 tablet, Rfl: 2   lidocaine -prilocaine  (EMLA ) cream, Apply 1 Application topically as needed. Apply one hour prior to port access and cover with plastic wrap, Disp: 30 g, Rfl: 0   metoprolol  succinate (TOPROL -XL) 50 MG 24 hr tablet, TAKE 1 TABLET BY MOUTH DAILY WITH OR IMMEDIATELY FOLLOWING A MEAL, Disp: 90 tablet, Rfl: 1   NON FORMULARY, Pt uses cpap nightly, Disp: , Rfl:    ipratropium (ATROVENT ) 0.03 % nasal spray, Place 1 spray into both nostrils daily. (Patient not taking: Reported on 11/17/2023), Disp: , Rfl:    nitroGLYCERIN  (NITROSTAT ) 0.4 MG SL tablet, Place 1 tablet (0.4 mg total) under the tongue every 5 (five) minutes as needed for chest pain. (Patient not taking: Reported on 11/17/2023), Disp: 25 tablet, Rfl: 3   ondansetron  (ZOFRAN ) 8 MG tablet, Take 1 tablet (8 mg total) by mouth every 8 (eight) hours as needed for nausea or vomiting. Start on the third day after chemotherapy. (Patient not taking: Reported on 11/17/2023), Disp: 30 tablet, Rfl: 1  prochlorperazine  (COMPAZINE ) 10 MG tablet, Take 1 tablet (10 mg total) by mouth every 6 (six) hours as needed for nausea or vomiting. (Patient not taking: Reported on 11/17/2023), Disp: 30 tablet, Rfl: 1  Allergies:  Allergies  Allergen Reactions   Lambs Quarters Hives, Itching and Nausea And Vomiting   Entresto  [Sacubitril -Valsartan ]     Low BP   Lanolin Acid [Lanolin] Rash   Other Itching and Rash    wool    Past Medical History, Surgical history, Social history, and Family History were reviewed and updated.  Review of Systems: Review of Systems  Constitutional: Negative.   HENT:  Negative.    Eyes: Negative.   Respiratory: Negative.    Cardiovascular: Negative.    Gastrointestinal: Negative.   Endocrine: Negative.   Genitourinary: Negative.  Negative for difficulty urinating.   Musculoskeletal: Negative.   Skin: Negative.   Neurological: Negative.   Hematological: Negative.   Psychiatric/Behavioral: Negative.      Physical Exam:  height is 5' 4.5" (1.638 m) and weight is 151 lb 1.9 oz (68.5 kg). Her oral temperature is 98.2 F (36.8 C). Her blood pressure is 83/54 (abnormal) and her pulse is 69. Her respiration is 20 and oxygen saturation is 100%.   Wt Readings from Last 3 Encounters:  11/17/23 151 lb 1.9 oz (68.5 kg)  11/07/23 141 lb (64 kg)  10/14/23 143 lb (64.9 kg)    Physical Exam Vitals reviewed.  Constitutional:      Comments: Breast exam shows left breast no masses, edema or erythema.  There is no left axillary adenopathy.  Right chest wall shows will healing mastectomy.  There is no erythema or warmth.  There is no right axillary adenopathy.  HENT:     Head: Normocephalic and atraumatic.  Eyes:     Pupils: Pupils are equal, round, and reactive to light.  Cardiovascular:     Rate and Rhythm: Normal rate and regular rhythm.     Heart sounds: Normal heart sounds.  Pulmonary:     Effort: Pulmonary effort is normal.     Breath sounds: Normal breath sounds.  Abdominal:     General: Bowel sounds are normal.     Palpations: Abdomen is soft.  Musculoskeletal:        General: No tenderness or deformity. Normal range of motion.     Cervical back: Normal range of motion.  Lymphadenopathy:     Cervical: No cervical adenopathy.  Skin:    General: Skin is warm and dry.     Findings: No erythema or rash.  Neurological:     Mental Status: She is alert and oriented to person, place, and time.  Psychiatric:        Behavior: Behavior normal.        Thought Content: Thought content normal.        Judgment: Judgment normal.     Lab Results  Component Value Date   WBC 4.6 11/17/2023   HGB 9.6 (L) 11/17/2023   HCT 28.8 (L)  11/17/2023   MCV 114.7 (H) 11/17/2023   PLT 245 11/17/2023     Chemistry      Component Value Date/Time   NA 136 10/14/2023 0940   NA 141 03/24/2023 0832   NA 142 01/30/2017 0847   NA 140 02/01/2016 0841   K 4.0 10/14/2023 0940   K 4.9 (H) 01/30/2017 0847   K 4.8 02/01/2016 0841   CL 101 10/14/2023 0940   CL 104 01/30/2017 0847  CO2 25 10/14/2023 0940   CO2 32 01/30/2017 0847   CO2 27 02/01/2016 0841   BUN 11 10/14/2023 0940   BUN 9 03/24/2023 0832   BUN 11 01/30/2017 0847   BUN 12.7 02/01/2016 0841   CREATININE 0.72 10/14/2023 0940   CREATININE 1.0 01/30/2017 0847   CREATININE 0.8 02/01/2016 0841      Component Value Date/Time   CALCIUM  8.8 (L) 10/14/2023 0940   CALCIUM  9.6 01/30/2017 0847   CALCIUM  10.0 02/01/2016 0841   ALKPHOS 46 10/14/2023 0940   ALKPHOS 51 01/30/2017 0847   ALKPHOS 59 02/01/2016 0841   AST 18 10/14/2023 0940   AST 22 02/01/2016 0841   ALT 18 10/14/2023 0940   ALT 25 01/30/2017 0847   ALT 25 02/01/2016 0841   BILITOT 0.8 10/14/2023 0940   BILITOT 0.93 02/01/2016 0841      Impression and Plan: Rhonda Paul is a very nice 73 year old white female.  She had a remote history of early-stage breast cancer of the right breast.  She had adjuvant chemotherapy for that.    Now, she has a new breast cancer in the right breast.  This cancer is HER2 positive.  We will subsequently go ahead and treat her with anti-HER2 therapy with the chemotherapy in the adjuvant setting.  She has stage I disease.  Thankfully, she had a negative lymph node.  She finished her adjuvant chemotherapy back in May.  She did well with this.  She does have some tearing of the eyes which I am sure is from the Taxotere .  Again, I do think we can have to get her on adjuvant Kadcyla.  I also think we need to give her on adjuvant Femara.  I think both of these would be very helpful for her.  She is ER positive so Femara I think would be necessary.  I we will go ahead and get her  started on treatment next week.  We are to have to repeat the echocardiogram in about 3 months.  If I do see any decrease in her cardiac ejection fraction, we will clearly have to stop the Kadcyla.  I will plan to see her back myself for her second cycle of the Kadcyla.  Given that she only has stage I disease, I think she will only need probably 9 months of Kadcyla.     Ivor Mars, MD 6/9/20259:32 AM

## 2023-11-17 NOTE — Patient Instructions (Signed)

## 2023-11-18 ENCOUNTER — Encounter: Payer: Self-pay | Admitting: *Deleted

## 2023-11-18 ENCOUNTER — Other Ambulatory Visit: Payer: Medicare HMO

## 2023-11-18 LAB — CANCER ANTIGEN 27.29: CA 27.29: 13.5 U/mL (ref 0.0–38.6)

## 2023-11-18 MED ORDER — FUROSEMIDE 20 MG PO TABS: ORAL_TABLET | ORAL | 0 refills | Status: DC

## 2023-11-18 NOTE — Progress Notes (Unsigned)
 Patient has completed upfront treatment. She completed post treatment ECHO which did show some decrease in EF. This will have to be closely followed.   She will now start AI plus anti HER2 maintenance. Prescription for Femara sent. Patient will start nine months of Kadcyla on 11/24/2023.  Oncology Nurse Navigator Documentation     11/18/2023    9:15 AM  Oncology Nurse Navigator Flowsheets  Phase of Treatment AI (Aromatase Inhibitors)  Aromatase Inhibitor Actual Start Date: 11/17/2023  Navigator Follow Up Date: 12/15/2023  Navigator Follow Up Reason: Follow-up Appointment;Chemotherapy  Navigator Location [facility]  Navigator Encounter Type Appt/Treatment Plan Review  Patient Visit Type MedOnc  Treatment Phase Active Tx  Barriers/Navigation Needs No Barriers At This Time  Interventions None Required  Acuity Level 1-No Barriers  Support Groups/Services Friends and Family  Time Spent with Patient 15

## 2023-11-19 ENCOUNTER — Encounter: Payer: Self-pay | Admitting: Hematology & Oncology

## 2023-11-19 NOTE — Progress Notes (Signed)
 Pharmacist Chemotherapy Monitoring - Initial Assessment    Anticipated start date: 11/24/23   The following has been reviewed per standard work regarding the patient's treatment regimen: The patient's diagnosis, treatment plan and drug doses, and organ/hematologic function Lab orders and baseline tests specific to treatment regimen  The treatment plan start date, drug sequencing, and pre-medications Prior authorization status  Patient's documented medication list, including drug-drug interaction screen and prescriptions for anti-emetics and supportive care specific to the treatment regimen The drug concentrations, fluid compatibility, administration routes, and timing of the medications to be used The patient's access for treatment and lifetime cumulative dose history, if applicable  The patient's medication allergies and previous infusion related reactions, if applicable   Changes made to treatment plan:  N/A  Follow up needed:  N/A   Rhonda Paul, [facility], 11/19/2023  3:41 PM

## 2023-11-21 MED ORDER — POTASSIUM CHLORIDE CRYS ER 20 MEQ PO TBCR: EXTENDED_RELEASE_TABLET | ORAL | 0 refills | Status: DC

## 2023-11-24 ENCOUNTER — Inpatient Hospital Stay

## 2023-11-24 ENCOUNTER — Other Ambulatory Visit (HOSPITAL_BASED_OUTPATIENT_CLINIC_OR_DEPARTMENT_OTHER): Payer: Self-pay | Admitting: Cardiovascular Disease

## 2023-11-24 ENCOUNTER — Other Ambulatory Visit (HOSPITAL_BASED_OUTPATIENT_CLINIC_OR_DEPARTMENT_OTHER): Payer: Self-pay | Admitting: Family

## 2023-11-24 VITALS — BP 93/66 | HR 71 | Temp 97.8°F | Resp 20

## 2023-11-24 DIAGNOSIS — C50911 Malignant neoplasm of unspecified site of right female breast: Secondary | ICD-10-CM

## 2023-11-24 DIAGNOSIS — Z5112 Encounter for antineoplastic immunotherapy: Secondary | ICD-10-CM | POA: Diagnosis not present

## 2023-11-24 LAB — CMP (CANCER CENTER ONLY)
ALT: 13 U/L (ref 0–44)
AST: 19 U/L (ref 15–41)
Albumin: 3.5 g/dL (ref 3.5–5.0)
Alkaline Phosphatase: 40 U/L (ref 38–126)
Anion gap: 7 (ref 5–15)
BUN: 6 mg/dL — ABNORMAL LOW (ref 8–23)
CO2: 30 mmol/L (ref 22–32)
Calcium: 8 mg/dL — ABNORMAL LOW (ref 8.9–10.3)
Chloride: 103 mmol/L (ref 98–111)
Creatinine: 0.64 mg/dL (ref 0.44–1.00)
GFR, Estimated: >60 mL/min (ref >60)
Glucose, Bld: 125 mg/dL — ABNORMAL HIGH (ref 70–99)
Potassium: 3.5 mmol/L (ref 3.5–5.1)
Sodium: 140 mmol/L (ref 135–145)
Total Bilirubin: 1.1 mg/dL (ref 0.0–1.2)
Total Protein: 5.5 g/dL — ABNORMAL LOW (ref 6.5–8.1)

## 2023-11-24 LAB — CBC WITH DIFFERENTIAL (CANCER CENTER ONLY)
Abs Immature Granulocytes: 0.02 10*3/uL (ref 0.00–0.07)
Basophils Absolute: 0.1 10*3/uL (ref 0.0–0.1)
Basophils Relative: 2 %
Eosinophils Absolute: 0.1 10*3/uL (ref 0.0–0.5)
Eosinophils Relative: 3 %
HCT: 29.8 % — ABNORMAL LOW (ref 36.0–46.0)
Hemoglobin: 9.8 g/dL — ABNORMAL LOW (ref 12.0–15.0)
Immature Granulocytes: 1 %
Lymphocytes Relative: 36 %
Lymphs Abs: 1.5 10*3/uL (ref 0.7–4.0)
MCH: 37.8 pg — ABNORMAL HIGH (ref 26.0–34.0)
MCHC: 32.9 g/dL (ref 30.0–36.0)
MCV: 115.1 fL — ABNORMAL HIGH (ref 80.0–100.0)
Monocytes Absolute: 0.5 10*3/uL (ref 0.1–1.0)
Monocytes Relative: 12 %
Neutro Abs: 1.9 10*3/uL (ref 1.7–7.7)
Neutrophils Relative %: 46 %
Platelet Count: 218 10*3/uL (ref 150–400)
RBC: 2.59 MIL/uL — ABNORMAL LOW (ref 3.87–5.11)
RDW: 14.6 % (ref 11.5–15.5)
Smear Review: NORMAL
WBC Count: 4 10*3/uL (ref 4.0–10.5)
nRBC: 0 % (ref 0.0–0.2)

## 2023-11-24 MED ORDER — SODIUM CHLORIDE 0.9 % IV SOLN: INTRAVENOUS | Status: DC

## 2023-11-24 MED ORDER — SODIUM CHLORIDE 0.9 % IV SOLN
3.6000 mg/kg | Freq: Once | INTRAVENOUS | Status: AC
  Administered 2023-11-24: 260 mg via INTRAVENOUS
  Filled 2023-11-24: qty 8

## 2023-11-24 MED ORDER — DIPHENHYDRAMINE HCL 25 MG PO CAPS
50.0000 mg | ORAL_CAPSULE | Freq: Once | ORAL | Status: AC
  Filled 2023-11-24: qty 2

## 2023-11-24 MED ORDER — HEPARIN SOD (PORK) LOCK FLUSH 100 UNIT/ML IV SOLN: 500.0000 [IU] | Freq: Once | INTRAVENOUS | Status: AC | PRN

## 2023-11-24 MED ORDER — PROCHLORPERAZINE MALEATE 10 MG PO TABS
10.0000 mg | ORAL_TABLET | Freq: Once | ORAL | Status: AC
  Filled 2023-11-24: qty 1

## 2023-11-24 MED ORDER — ACETAMINOPHEN 325 MG PO TABS
650.0000 mg | ORAL_TABLET | Freq: Once | ORAL | Status: AC
  Filled 2023-11-24: qty 2

## 2023-11-24 MED ORDER — SODIUM CHLORIDE 0.9% FLUSH: 10.0000 mL | INTRAVENOUS | Status: DC | PRN

## 2023-11-24 NOTE — Progress Notes (Signed)
 OK to proceed with treatment with labs from 11/17/22 and BP 86/49 per order of Dr. Maria Shiner.

## 2023-11-24 NOTE — Patient Instructions (Addendum)
 Ado-Trastuzumab  Emtansine Injection What is this medication? ADO-TRASTUZUMAB  EMTANSINE (ADD oh traz TOO zuh mab em TAN zine) treats breast cancer. It works by blocking a protein that causes cancer cells to grow and multiply. This helps to slow or stop the spread of cancer cells. This medicine may be used for other purposes; ask your health care provider or pharmacist if you have questions. COMMON BRAND NAME(S): Kadcyla What should I tell my care team before I take this medication? They need to know if you have any of these conditions: Heart failure Liver disease Low platelet levels Lung disease Tingling of the fingers or toes or other nerve disorder An unusual or allergic reaction to ado-trastuzumab  emtansine, other medications, foods, dyes, or preservatives Pregnant or trying to get pregnant Breast-feeding How should I use this medication? This medication is infused into a vein. It is given by your care team in a hospital or clinic setting. Talk to your care team about the use of this medication in children. Special care may be needed. Overdosage: If you think you have taken too much of this medicine contact a poison control center or emergency room at once. NOTE: This medicine is only for you. Do not share this medicine with others. What if I miss a dose? Keep appointments for follow-up doses. It is important not to miss your dose. Call your care team if you are unable to keep an appointment. What may interact with this medication? Atazanavir Boceprevir Clarithromycin Dalfopristin; quinupristin Delavirdine Indinavir Isoniazid, INH Itraconazole Ketoconazole Nefazodone Nelfinavir Ritonavir Telaprevir Telithromycin Tipranavir Voriconazole This list may not describe all possible interactions. Give your health care provider a list of all the medicines, herbs, non-prescription drugs, or dietary supplements you use. Also tell them if you smoke, drink alcohol, or use illegal drugs.  Some items may interact with your medicine. What should I watch for while using this medication? This medication may make you feel generally unwell. This is not uncommon, as chemotherapy can affect healthy cells as well as cancer cells. Report any side effects. Continue your course of treatment even though you feel ill unless your care team tells you to stop. You may need blood work while taking this medication. This medication may increase your risk to bruise or bleed. Call your care team if you notice any unusual bleeding. Be careful brushing or flossing your teeth or using a toothpick because you may get an infection or bleed more easily. If you have any dental work done, tell your dentist you are receiving this medication. Talk to your care team if you may be pregnant. Serious birth defects can occur if you take this medication during pregnancy and for 7 months after the last dose. You will need a negative pregnancy test before starting this medication. Contraception is recommended while taking this medication and for 7 months after the last dose. Your care team can help you find the option that works for you. If your partner can get pregnant, use a condom during sex while taking this medication and for 4 months after the last dose. Do not breastfeed while taking this medication and for 7 months after the last dose. This medication may cause infertility. Talk to your care team if you are concerned with your fertility. What side effects may I notice from receiving this medication? Side effects that you should report to your care team as soon as possible: Allergic reactions--skin rash, itching, hives, swelling of the face, lips, tongue, or throat Bleeding--bloody or black, tar-like stools, vomiting  blood or brown material that looks like coffee grounds, red or dark brown urine, small red or purple spots on skin, unusual bruising or bleeding Dry cough, shortness of breath or trouble breathing Heart  failure--shortness of breath, swelling of the ankles, feet, or hands, sudden weight gain, unusual weakness or fatigue Infusion reactions--chest pain, shortness of breath or trouble breathing, feeling faint or lightheaded Liver injury--right upper belly pain, loss of appetite, nausea, light-colored stool, dark yellow or brown urine, yellowing skin or eyes, unusual weakness or fatigue Pain, tingling, or numbness in the hands or feet Painful swelling, warmth, or redness of the skin, blisters or sores at the infusion site Side effects that usually do not require medical attention (report to your care team if they continue or are bothersome): Constipation Fatigue Headache Muscle pain Nausea This list may not describe all possible side effects. Call your doctor for medical advice about side effects. You may report side effects to FDA at 1-800-FDA-1088. Where should I keep my medication? This medication is given in a hospital or clinic. It will not be stored at home. NOTE: This sheet is a summary. It may not cover all possible information. If you have questions about this medicine, talk to your doctor, pharmacist, or health care provider.  2024 Elsevier/Gold Standard (2021-10-12 00:00:00)[facility] - A DEPT OF [facility]  Discharge Instructions: Thank you for choosing [facility] to provide your oncology and hematology care.   If you have a lab appointment with the Cancer Center, please go directly to the Cancer Center and check in at the registration area.  Wear comfortable clothing and clothing appropriate for easy access to any Portacath or PICC line.   We strive to give you quality time with your provider. You may need to reschedule your appointment if you arrive late (15 or more minutes).  Arriving late affects you and other patients whose appointments are after yours.  Also, if you miss three or more appointments without notifying the office, you  may be dismissed from the clinic at the provider's discretion.      For prescription refill requests, have your pharmacy contact our office and allow 72 hours for refills to be completed.    Today you received the following chemotherapy and/or immunotherapy agents:  Kadcyla      To help prevent nausea and vomiting after your treatment, we encourage you to take your nausea medication as directed.  BELOW ARE SYMPTOMS THAT SHOULD BE REPORTED IMMEDIATELY: *FEVER GREATER THAN 100.4 F (38 C) OR HIGHER *CHILLS OR SWEATING *NAUSEA AND VOMITING THAT IS NOT CONTROLLED WITH YOUR NAUSEA MEDICATION *UNUSUAL SHORTNESS OF BREATH *UNUSUAL BRUISING OR BLEEDING *URINARY PROBLEMS (pain or burning when urinating, or frequent urination) *BOWEL PROBLEMS (unusual diarrhea, constipation, pain near the anus) TENDERNESS IN MOUTH AND THROAT WITH OR WITHOUT PRESENCE OF ULCERS (sore throat, sores in mouth, or a toothache) UNUSUAL RASH, SWELLING OR PAIN  UNUSUAL VAGINAL DISCHARGE OR ITCHING   Items with * indicate a potential emergency and should be followed up as soon as possible or go to the Emergency Department if any problems should occur.  Please show the CHEMOTHERAPY ALERT CARD or IMMUNOTHERAPY ALERT CARD at check-in to the Emergency Department and triage nurse. Should you have questions after your visit or need to cancel or reschedule your appointment, please contact [facility] - A DEPT OF [facility]  [phone] and follow the prompts.  Office  hours are 8:00 a.m. to 4:30 p.m. Monday - Friday. Please note that voicemails left after 4:00 p.m. may not be returned until the following business day.  We are closed weekends and major holidays. You have access to a nurse at all times for urgent questions. Please call the main number to the clinic [phone] and follow the prompts.  For any non-urgent questions, you may also contact your provider using MyChart. We now offer e-Visits  for anyone 28 and older to request care online for non-urgent symptoms. For details visit [email or web address].   Also download the MyChart app! Go to the app store, search MyChart, open the app, select [facility], and log in with your MyChart username and password.

## 2023-11-27 ENCOUNTER — Other Ambulatory Visit (HOSPITAL_BASED_OUTPATIENT_CLINIC_OR_DEPARTMENT_OTHER): Payer: Self-pay | Admitting: Cardiovascular Disease

## 2023-11-27 MED ORDER — DAPAGLIFLOZIN PROPANEDIOL 10 MG PO TABS: 10.0000 mg | ORAL_TABLET | Freq: Every day | ORAL | Status: DC

## 2023-11-27 NOTE — Telephone Encounter (Signed)
 Glad swellin gis improving. Appreciate her keeping feet up and wearing compression stockings.   Okay to resume Farxiga  10mg  daily. If she experience symptomatic low BP (I.e. lightheadedness, dizziness, weakness) recommend let us  know and stop Farxiga .   Mry Lamia S Charlye Spare, NP

## 2023-11-27 NOTE — Telephone Encounter (Signed)
 Please revie and advise

## 2023-11-28 NOTE — Telephone Encounter (Signed)
 No longer supposed to take Lasix  (likely pharmacy auto-requesting refill).   Anhthu Perdew S Gautam Langhorst, NP

## 2023-11-28 NOTE — Telephone Encounter (Signed)
 Pt's pharmacy is asking for a refill on medication furosemide . This medication was only prescribed for 3 days. Does Dr. Veryl Gottron want pt to continue this medication? Please address

## 2023-12-11 ENCOUNTER — Ambulatory Visit: Attending: Surgery | Admitting: Physical Therapy

## 2023-12-11 DIAGNOSIS — Z483 Aftercare following surgery for neoplasm: Secondary | ICD-10-CM | POA: Insufficient documentation

## 2023-12-11 NOTE — Therapy (Signed)
 OUTPATIENT PHYSICAL THERAPY SOZO SCREENING NOTE   Patient Name: Rhonda Paul MRN: [id number] DOB:[birth year], 73 y.o., female Today's Date: 12/11/2023  PCP: Cleotilde Planas, MD REFERRING PROVIDER: Vanderbilt Ned, MD   PT End of Session - 12/11/23 1332     Visit Number 2    PT Start Time 1058    PT Stop Time 1110    PT Time Calculation (min) 12 min    Activity Tolerance Patient tolerated treatment well    Behavior During Therapy [facility] for tasks assessed/performed          Past Medical History:  Diagnosis Date   Adenocarcinoma of breast (HCC) 11/2001   RIGHT   Arthritis    CHF (congestive heart failure) (HCC)    Chronic anticoagulation    Eliquis    Coronary artery disease    Nonobstructive   Diverticulitis 2009   diverticulitis   Dysrhythmia    A. Fib   Heart attack (HCC)    One week after stroke   Heart murmur    HOH (hard of hearing)    right ear; wears hearing aid   Hypothyroidism    Infertility, female 02/2000   4 SAB   Metrorrhagia    Migraine 02/2000   Permanent atrial fibrillation (HCC) 04/01/2014   Personal history of chemotherapy 2003   Personal history of radiation therapy 2003   Sleep apnea    STD (sexually transmitted disease) 1976   Hx of HSV   Stroke (HCC) 01/03/2015   s/p TPA R-MCA   Vitamin D  deficiency 07/2006   Past Surgical History:  Procedure Laterality Date   APPENDECTOMY  1959   BREAST BIOPSY Right 05/19/2023   US  RT BREAST BX W LOC DEV 1ST LESION IMG BX SPEC US  GUIDE 05/19/2023 [facility] MAMMOGRAPHY   BREAST SURGERY Right 11/2001   lumpectomy with sentinel node biopsy X 3 all negative, ER/PR negative , chemo 6 doses, and radiation daily X 6 weeks   BUBBLE STUDY  04/18/2022   Procedure: BUBBLE STUDY;  Surgeon: Santo Stanly LABOR, MD;  Location: [facility];  Service: Cardiovascular;;   CARDIAC CATHETERIZATION N/A 01/16/2015   Procedure: Left Heart Cath and Coronary Angiography;  Surgeon: Victory LELON Sharps, MD; OM1 75%, RCA 30%, EF  normal; spasm in RCA w/ catheter engagement    COLONOSCOPY  11/2012   diverticulitis, 2 polyps negative, repeat in 5 years   HALLUX FUSION Left 03/03/2017   Procedure: HALLUX RIGIDUS CORRECTION WITH COLECTOMY DEBRIDEMENT AND CAPSULAR RELEASE OF THE FIRST MPJ WITH IMPLANT LEFT FOOT;  Surgeon: Zan Factor, DPM;  Location: [facility];  Service: Podiatry;  Laterality: Left;   JOINT REPLACEMENT Left 02/2017   big toe   MASTECTOMY W/ SENTINEL NODE BIOPSY Right 07/04/2023   Procedure: RIGHT SIMPLE MASTECTOMY SENTINEL LYMPH NODE MAPPING;  Surgeon: Vanderbilt Ned, MD;  Location: [facility] OR;  Service: General;  Laterality: Right;  PEC BLOCK   PORT-A-CATH REMOVAL  2004   PORTA CATH INSERTION  2003   PORTACATH PLACEMENT Right 07/04/2023   Procedure: INSERTION PORT-A-CATH WITH ULTRASOUND GUIDANCE;  Surgeon: Vanderbilt Ned, MD;  Location: [facility] OR;  Service: General;  Laterality: Right;   RADIOLOGY WITH ANESTHESIA N/A 01/03/2015   Procedure: RADIOLOGY WITH ANESTHESIA;  Surgeon: Medication Radiologist, MD;  Location: [facility] OR;  Service: Radiology;  Laterality: N/A;   TEE WITHOUT CARDIOVERSION N/A 04/18/2022   Procedure: TRANSESOPHAGEAL ECHOCARDIOGRAM (TEE);  Surgeon: Santo Stanly LABOR, MD;  Location: [facility];  Service: Cardiovascular;  Laterality: N/A;   Patient  Active Problem List   Diagnosis Date Noted   Breast cancer, stage 1, estrogen receptor positive, right (HCC) 07/04/2023   CAD in native artery 07/28/2016   Chronic anticoagulation     NSTEMI- med rx, culprit lesion 75% OM1 01/14/2015   Atelectasis    Acute respiratory failure with hypoxemia (HCC)    Cerebral infarction (HCC) 01/03/2015   Permanent atrial fibrillation (HCC) 04/01/2014   Moderate mitral regurgitation 04/01/2014   Breast cancer (HCC) 04/01/2014   Hyperlipidemia 04/01/2014    REFERRING DIAG: right breast cancer at risk for lymphedema  THERAPY DIAG:  Aftercare following surgery for neoplasm  PERTINENT  HISTORY: Patient was diagnosed with right grade 2 IDC. It measures 1.3 cm and is located in the upper inner quadrant. It is ER pos, PR neg, HER2pos with a Ki67 of 15%. Hx of Rt breast cancer 20 years ago with lumpectomy and radiation and chemotherapy. Rt mastectomy on 07/04/23 with 1 negative node removed. Will start chemo on 08/11/23. Moderate mitral valve regurgitation and Afib (4 total with the previous lumpectomy)   PRECAUTIONS: right UE Lymphedema risk  SUBJECTIVE: Here for SOZO screen  PAIN:  Are you having pain? No  SOZO SCREENING: Patient was assessed today using the SOZO machine to determine the lymphedema index score. This was compared to her baseline score. It was determined that she is within the recommended range when compared to her baseline and no further action is needed at this time. She will continue SOZO screenings. These are done every 3 months for 2 years post operatively followed by every 6 months for 2 years, and then annually.   L-DEX FLOWSHEETS - 12/11/23 1300       L-DEX LYMPHEDEMA SCREENING   Measurement Type Unilateral    L-DEX MEASUREMENT EXTREMITY Upper Extremity    POSITION  Standing    DOMINANT SIDE Right    At Risk Side Right    BASELINE SCORE (UNILATERAL) -1.6    L-DEX SCORE (UNILATERAL) -0.1    VALUE CHANGE (UNILAT) 1.5         Eward Wonda Sharps, PT 12/11/23 1:33 PM

## 2023-12-12 ENCOUNTER — Other Ambulatory Visit: Payer: Self-pay

## 2023-12-15 ENCOUNTER — Inpatient Hospital Stay

## 2023-12-15 ENCOUNTER — Encounter: Payer: Self-pay | Admitting: Medical Oncology

## 2023-12-15 ENCOUNTER — Inpatient Hospital Stay: Attending: Hematology & Oncology | Admitting: Medical Oncology

## 2023-12-15 VITALS — BP 98/54 | HR 79 | Resp 18

## 2023-12-15 VITALS — BP 89/61 | HR 71 | Temp 98.3°F | Resp 18 | Ht 64.0 in | Wt 141.1 lb

## 2023-12-15 DIAGNOSIS — C50911 Malignant neoplasm of unspecified site of right female breast: Secondary | ICD-10-CM

## 2023-12-15 DIAGNOSIS — C50811 Malignant neoplasm of overlapping sites of right female breast: Secondary | ICD-10-CM | POA: Diagnosis present

## 2023-12-15 DIAGNOSIS — Z170 Estrogen receptor positive status [ER+]: Secondary | ICD-10-CM | POA: Diagnosis not present

## 2023-12-15 DIAGNOSIS — Z79811 Long term (current) use of aromatase inhibitors: Secondary | ICD-10-CM | POA: Diagnosis not present

## 2023-12-15 DIAGNOSIS — Z5112 Encounter for antineoplastic immunotherapy: Secondary | ICD-10-CM | POA: Insufficient documentation

## 2023-12-15 DIAGNOSIS — Z1731 Human epidermal growth factor receptor 2 positive status: Secondary | ICD-10-CM | POA: Insufficient documentation

## 2023-12-15 LAB — CBC WITH DIFFERENTIAL (CANCER CENTER ONLY)
Abs Immature Granulocytes: 0.01 K/uL (ref 0.00–0.07)
Basophils Absolute: 0 K/uL (ref 0.0–0.1)
Basophils Relative: 1 %
Eosinophils Absolute: 0.1 K/uL (ref 0.0–0.5)
Eosinophils Relative: 3 %
HCT: 31.7 % — ABNORMAL LOW (ref 36.0–46.0)
Hemoglobin: 10.8 g/dL — ABNORMAL LOW (ref 12.0–15.0)
Immature Granulocytes: 0 %
Lymphocytes Relative: 38 %
Lymphs Abs: 1.4 K/uL (ref 0.7–4.0)
MCH: 38.2 pg — ABNORMAL HIGH (ref 26.0–34.0)
MCHC: 34.1 g/dL (ref 30.0–36.0)
MCV: 112 fL — ABNORMAL HIGH (ref 80.0–100.0)
Monocytes Absolute: 0.4 K/uL (ref 0.1–1.0)
Monocytes Relative: 11 %
Neutro Abs: 1.8 K/uL (ref 1.7–7.7)
Neutrophils Relative %: 47 %
Platelet Count: 166 K/uL (ref 150–400)
RBC: 2.83 MIL/uL — ABNORMAL LOW (ref 3.87–5.11)
RDW: 12.5 % (ref 11.5–15.5)
Smear Review: NORMAL
WBC Count: 3.8 K/uL — ABNORMAL LOW (ref 4.0–10.5)
nRBC: 0 % (ref 0.0–0.2)

## 2023-12-15 LAB — CMP (CANCER CENTER ONLY)
ALT: 14 U/L (ref 0–44)
AST: 21 U/L (ref 15–41)
Albumin: 3.7 g/dL (ref 3.5–5.0)
Alkaline Phosphatase: 51 U/L (ref 38–126)
Anion gap: 9 (ref 5–15)
BUN: 6 mg/dL — ABNORMAL LOW (ref 8–23)
CO2: 29 mmol/L (ref 22–32)
Calcium: 8.9 mg/dL (ref 8.9–10.3)
Chloride: 103 mmol/L (ref 98–111)
Creatinine: 0.73 mg/dL (ref 0.44–1.00)
GFR, Estimated: >60 mL/min (ref >60)
Glucose, Bld: 125 mg/dL — ABNORMAL HIGH (ref 70–99)
Potassium: 3.4 mmol/L — ABNORMAL LOW (ref 3.5–5.1)
Sodium: 141 mmol/L (ref 135–145)
Total Bilirubin: 0.9 mg/dL (ref 0.0–1.2)
Total Protein: 6.2 g/dL — ABNORMAL LOW (ref 6.5–8.1)

## 2023-12-15 LAB — LACTATE DEHYDROGENASE: LDH: 181 U/L (ref 98–192)

## 2023-12-15 MED ORDER — HEPARIN SOD (PORK) LOCK FLUSH 100 UNIT/ML IV SOLN
500.0000 [IU] | Freq: Once | INTRAVENOUS | Status: AC | PRN
  Administered 2023-12-15: 500 [IU]

## 2023-12-15 MED ORDER — PROCHLORPERAZINE MALEATE 10 MG PO TABS
10.0000 mg | ORAL_TABLET | Freq: Once | ORAL | Status: AC
  Administered 2023-12-15: 10 mg via ORAL
  Filled 2023-12-15: qty 1

## 2023-12-15 MED ORDER — SODIUM CHLORIDE 0.9 % IV SOLN
3.6000 mg/kg | Freq: Once | INTRAVENOUS | Status: AC
  Administered 2023-12-15: 260 mg via INTRAVENOUS
  Filled 2023-12-15: qty 8

## 2023-12-15 MED ORDER — ACETAMINOPHEN 325 MG PO TABS
650.0000 mg | ORAL_TABLET | Freq: Once | ORAL | Status: AC
  Administered 2023-12-15: 650 mg via ORAL
  Filled 2023-12-15: qty 2

## 2023-12-15 MED ORDER — SODIUM CHLORIDE 0.9 % IV SOLN
INTRAVENOUS | Status: DC
Start: 2023-12-15 — End: 2023-12-15

## 2023-12-15 MED ORDER — DIPHENHYDRAMINE HCL 25 MG PO CAPS
50.0000 mg | ORAL_CAPSULE | Freq: Once | ORAL | Status: AC
  Administered 2023-12-15: 50 mg via ORAL
  Filled 2023-12-15: qty 2

## 2023-12-15 MED ORDER — SODIUM CHLORIDE 0.9% FLUSH
10.0000 mL | INTRAVENOUS | Status: DC | PRN
  Administered 2023-12-15: 10 mL

## 2023-12-15 NOTE — Progress Notes (Signed)
 Hematology and Oncology Follow Up Visit  Rhonda Paul [id number] 1950-09-05 73 y.o. 12/15/2023   Principle Diagnosis:  Stage IA (T1cN0M0) invasive ductal carcinoma of the right breast -- ER+/PR-/HER2+ Remote history of stage I-triple negative-cancer of the right breast-2004  Current Therapy:   Status post right mastectomy --07/04/2023 Adjuvant Carbo/Taxotere /Herceptin / --s/pcycle #4/4 -- start on 08/11/2023 Kadcyla  3.6 mg/m2 IV q 3 weeks -- start on 11/24/2023 Femara  2.5 mg po q day -- start on 11/18/2023     Interim History:  Rhonda Paul is back for follow-up.  Today she reports that she is doing well. She has no concerns. She is slowly feeling better following the completion of chemotherapy. She is tolerating the Kadcyla  well. She reports no side effects.   Her last ECHO on 11/11/2023 showed a left ventricular ejection fraction of 50-55%.  This is down from 60% that she had prior to adjuvant therapy.  She does have atrial fibrillation and is on medication for this. She is seen by cardiology.   Plan at this time is to continue her on Kadcyla  for her breast cancer in the adjuvant setting.  We will have to watch her echocardiogram quite closely.  She has had no headache.  She has had no nausea or vomiting.  There is been no change in bowel or bladder habits.  She has had no leg swelling.  There is been no rashes.  She has had no bleeding.  She has had no mouth sores. No chest pain, or SOB.   Overall, I would have to say that her performance status is ECOG 1    Wt Readings from Last 3 Encounters:  12/15/23 141 lb 1.6 oz (64 kg)  11/17/23 151 lb 1.9 oz (68.5 kg)  11/07/23 141 lb (64 kg)     Medications:  Current Outpatient Medications:    apixaban  (ELIQUIS ) 5 MG TABS tablet, Take 1 tablet (5 mg total) by mouth 2 (two) times daily., Disp: 60 tablet, Rfl: 5   atorvastatin  (LIPITOR ) 40 MG tablet, TAKE ONE TABLET BY MOUTH DAILY, Disp: 90 tablet, Rfl: 3   Biotin w/ Vitamins C & E  (HAIR/SKIN/NAILS PO), Take 1 capsule by mouth daily., Disp: , Rfl:    dapagliflozin  propanediol (FARXIGA ) 10 MG TABS tablet, Take 1 tablet (10 mg total) by mouth daily before breakfast., Disp: , Rfl:    ezetimibe  (ZETIA ) 10 MG tablet, Take 1 tablet (10 mg total) by mouth daily., Disp: 90 tablet, Rfl: 3   letrozole  (FEMARA ) 2.5 MG tablet, Take 1 tablet (2.5 mg total) by mouth daily., Disp: 30 tablet, Rfl: 12   levothyroxine  (SYNTHROID ) 50 MCG tablet, TAKE 1 TABLET BY MOUTH DAILY, Disp: 90 tablet, Rfl: 2   lidocaine -prilocaine  (EMLA ) cream, Apply 1 Application topically as needed. Apply one hour prior to port access and cover with plastic wrap, Disp: 30 g, Rfl: 0   metoprolol  succinate (TOPROL -XL) 50 MG 24 hr tablet, TAKE 1 TABLET BY MOUTH DAILY WITH OR IMMEDIATELY FOLLOWING A MEAL, Disp: 90 tablet, Rfl: 1   NON FORMULARY, Pt uses cpap nightly, Disp: , Rfl:    ipratropium (ATROVENT ) 0.03 % nasal spray, Place 1 spray into both nostrils daily. (Patient not taking: Reported on 12/15/2023), Disp: , Rfl:    nitroGLYCERIN  (NITROSTAT ) 0.4 MG SL tablet, Place 1 tablet (0.4 mg total) under the tongue every 5 (five) minutes as needed for chest pain. (Patient not taking: Reported on 12/15/2023), Disp: 25 tablet, Rfl: 3 No current facility-administered medications for this visit.  Facility-Administered Medications Ordered in Other Visits:    0.9 %  sodium chloride  infusion, , Intravenous, Continuous, Ennever, Maude SAUNDERS, MD, Last Rate: 10 mL/hr at 12/15/23 1113, New Bag at 12/15/23 1113   ado-trastuzumab emtansine  (KADCYLA ) 260 mg in sodium chloride  0.9 % 250 mL chemo infusion, 3.6 mg/kg (Treatment Plan Recorded), Intravenous, Once, Ennever, Maude SAUNDERS, MD   heparin  lock flush 100 unit/mL, 500 Units, Intracatheter, Once PRN, Ennever, Peter R, MD   sodium chloride  flush (NS) 0.9 % injection 10 mL, 10 mL, Intracatheter, PRN, Ennever, Peter R, MD  Allergies:  Allergies  Allergen Reactions   [place] Hives, Itching  and Nausea And Vomiting   Entresto  [Sacubitril -Valsartan ] Other (See Comments)    Low BP   Lanolin Acid [Lanolin] Rash   Other Itching and Rash    wool    Past Medical History, Surgical history, Social history, and Family History were reviewed and updated.  Review of Systems: Review of Systems  Constitutional: Negative.   HENT:  Negative.    Eyes: Negative.   Respiratory: Negative.    Cardiovascular: Negative.   Gastrointestinal: Negative.   Endocrine: Negative.   Genitourinary: Negative.  Negative for difficulty urinating.   Musculoskeletal: Negative.   Skin: Negative.   Neurological: Negative.   Hematological: Negative.   Psychiatric/Behavioral: Negative.      Physical Exam:  height is 5' 4 (1.626 m) and weight is 141 lb 1.6 oz (64 kg). Her oral temperature is 98.3 F (36.8 C). Her blood pressure is 89/61 (abnormal) and her pulse is 71. Her respiration is 18 and oxygen saturation is 100%.   Wt Readings from Last 3 Encounters:  12/15/23 141 lb 1.6 oz (64 kg)  11/17/23 151 lb 1.9 oz (68.5 kg)  11/07/23 141 lb (64 kg)    Physical Exam Vitals reviewed.  HENT:     Head: Normocephalic and atraumatic.  Eyes:     Pupils: Pupils are equal, round, and reactive to light.  Cardiovascular:     Rate and Rhythm: Normal rate and regular rhythm.     Heart sounds: Normal heart sounds.  Pulmonary:     Effort: Pulmonary effort is normal.     Breath sounds: Normal breath sounds.  Abdominal:     General: Bowel sounds are normal.     Palpations: Abdomen is soft.  Musculoskeletal:        General: No tenderness or deformity. Normal range of motion.     Cervical back: Normal range of motion.  Lymphadenopathy:     Cervical: No cervical adenopathy.  Skin:    General: Skin is warm and dry.     Findings: No erythema or rash.  Neurological:     Mental Status: She is alert and oriented to person, place, and time.  Psychiatric:        Behavior: Behavior normal.        Thought  Content: Thought content normal.        Judgment: Judgment normal.      Lab Results  Component Value Date   WBC 3.8 (L) 12/15/2023   HGB 10.8 (L) 12/15/2023   HCT 31.7 (L) 12/15/2023   MCV 112.0 (H) 12/15/2023   PLT 166 12/15/2023     Chemistry      Component Value Date/Time   NA 141 12/15/2023 0857   NA 141 03/24/2023 0832   NA 142 01/30/2017 0847   NA 140 02/01/2016 0841   K 3.4 (L) 12/15/2023 0857   K 4.9 (H)  01/30/2017 0847   K 4.8 02/01/2016 0841   CL 103 12/15/2023 0857   CL 104 01/30/2017 0847   CO2 29 12/15/2023 0857   CO2 32 01/30/2017 0847   CO2 27 02/01/2016 0841   BUN 6 (L) 12/15/2023 0857   BUN 9 03/24/2023 0832   BUN 11 01/30/2017 0847   BUN 12.7 02/01/2016 0841   CREATININE 0.73 12/15/2023 0857   CREATININE 1.0 01/30/2017 0847   CREATININE 0.8 02/01/2016 0841      Component Value Date/Time   CALCIUM  8.9 12/15/2023 0857   CALCIUM  9.6 01/30/2017 0847   CALCIUM  10.0 02/01/2016 0841   ALKPHOS 51 12/15/2023 0857   ALKPHOS 51 01/30/2017 0847   ALKPHOS 59 02/01/2016 0841   AST 21 12/15/2023 0857   AST 22 02/01/2016 0841   ALT 14 12/15/2023 0857   ALT 25 01/30/2017 0847   ALT 25 02/01/2016 0841   BILITOT 0.9 12/15/2023 0857   BILITOT 0.93 02/01/2016 0841     Encounter Diagnosis  Name Primary?   Breast cancer, stage 1, estrogen receptor positive, right (HCC) Yes    Impression and Plan: Rhonda Paul is a very nice 73 year old white female.  She had a remote history of early-stage breast cancer of the right breast.  She had adjuvant chemotherapy for that.  Now, she has a new breast cancer in the right breast.  This cancer is HER2 positive. ER positive. We will subsequently go ahead and treat her with anti-HER2 therapy with the chemotherapy in the adjuvant setting.  She has stage I disease.  Thankfully, she had a negative lymph node.  She finished her adjuvant chemotherapy back in May.  She did well with this.  She does have some tearing of the eyes  and generalized fatigue suspected to be due to the Taxotere .  She is now on adjuvant Kadcyla  (9 months planned) and Femara .  Plan is to repeat her ECHO in 3 months- Sept.  If we see any further decrease in her cardiac ejection fraction, we will clearly have to stop the Kadcyla .  TX today RTC 3 weeks MD, port labs(CBC w/, CMP, LDH), TX ECHO planned for Sept  Penne Rosenstock M [place], PA-C 7/7/202511:25 AM

## 2023-12-15 NOTE — Patient Instructions (Signed)
 [facility] - A DEPT OF [facility]  Discharge Instructions: Thank you for choosing [facility] to provide your oncology and hematology care.   If you have a lab appointment with the Cancer Center, please go directly to the Cancer Center and check in at the registration area.  Wear comfortable clothing and clothing appropriate for easy access to any Portacath or PICC line.   We strive to give you quality time with your provider. You may need to reschedule your appointment if you arrive late (15 or more minutes).  Arriving late affects you and other patients whose appointments are after yours.  Also, if you miss three or more appointments without notifying the office, you may be dismissed from the clinic at the provider's discretion.      For prescription refill requests, have your pharmacy contact our office and allow 72 hours for refills to be completed.    Today you received the following chemotherapy and/or immunotherapy agents Kadcyla      To help prevent nausea and vomiting after your treatment, we encourage you to take your nausea medication as directed.  BELOW ARE SYMPTOMS THAT SHOULD BE REPORTED IMMEDIATELY: *FEVER GREATER THAN 100.4 F (38 C) OR HIGHER *CHILLS OR SWEATING *NAUSEA AND VOMITING THAT IS NOT CONTROLLED WITH YOUR NAUSEA MEDICATION *UNUSUAL SHORTNESS OF BREATH *UNUSUAL BRUISING OR BLEEDING *URINARY PROBLEMS (pain or burning when urinating, or frequent urination) *BOWEL PROBLEMS (unusual diarrhea, constipation, pain near the anus) TENDERNESS IN MOUTH AND THROAT WITH OR WITHOUT PRESENCE OF ULCERS (sore throat, sores in mouth, or a toothache) UNUSUAL RASH, SWELLING OR PAIN  UNUSUAL VAGINAL DISCHARGE OR ITCHING   Items with * indicate a potential emergency and should be followed up as soon as possible or go to the Emergency Department if any problems should occur.  Please show the CHEMOTHERAPY ALERT CARD or IMMUNOTHERAPY  ALERT CARD at check-in to the Emergency Department and triage nurse. Should you have questions after your visit or need to cancel or reschedule your appointment, please contact [facility] - A DEPT OF [facility]  [phone] and follow the prompts.  Office hours are 8:00 a.m. to 4:30 p.m. Monday - Friday. Please note that voicemails left after 4:00 p.m. may not be returned until the following business day.  We are closed weekends and major holidays. You have access to a nurse at all times for urgent questions. Please call the main number to the clinic [phone] and follow the prompts.  For any non-urgent questions, you may also contact your provider using MyChart. We now offer e-Visits for anyone 87 and older to request care online for non-urgent symptoms. For details visit [email or web address].   Also download the MyChart app! Go to the app store, search "MyChart", open the app, select [facility], and log in with your MyChart username and password.

## 2023-12-15 NOTE — Progress Notes (Addendum)
 BP recheck 99/56. Ok to treat. Ok to continue 260mg  today. Will continue to monitor weight changes.  Bridgett Leach Goodman, [place], BCPS, BCOP 12/15/2023 11:06 AM

## 2023-12-16 ENCOUNTER — Encounter: Payer: Self-pay | Admitting: *Deleted

## 2023-12-16 NOTE — Progress Notes (Signed)
 Patient received cycle two of maintenance Kadcyla  (planned nine months). She will need a follow up ECHO in September.   Oncology Nurse Navigator Documentation     12/16/2023    9:00 AM  Oncology Nurse Navigator Flowsheets  Navigator Follow Up Date: 01/05/2024  Navigator Follow Up Reason: Follow-up Appointment;Chemotherapy  Navigator Location [facility]  Navigator Encounter Type Appt/Treatment Plan Review  Patient Visit Type MedOnc  Treatment Phase Active Tx  Barriers/Navigation Needs No Barriers At This Time  Interventions None Required  Acuity Level 1-No Barriers  Support Groups/Services Friends and Family  Time Spent with Patient 15

## 2023-12-17 ENCOUNTER — Other Ambulatory Visit (HOSPITAL_BASED_OUTPATIENT_CLINIC_OR_DEPARTMENT_OTHER)

## 2023-12-20 ENCOUNTER — Other Ambulatory Visit: Payer: Self-pay | Admitting: Cardiology

## 2023-12-22 ENCOUNTER — Ambulatory Visit

## 2023-12-22 ENCOUNTER — Other Ambulatory Visit: Payer: Self-pay

## 2023-12-22 MED ORDER — METOPROLOL SUCCINATE ER 50 MG PO TB24: 50.0000 mg | ORAL_TABLET | Freq: Every day | ORAL | 3 refills | Status: AC

## 2023-12-30 DIAGNOSIS — S61219A Laceration without foreign body of unspecified finger without damage to nail, initial encounter: Secondary | ICD-10-CM | POA: Diagnosis not present

## 2023-12-31 DIAGNOSIS — H5213 Myopia, bilateral: Secondary | ICD-10-CM | POA: Diagnosis not present

## 2024-01-01 ENCOUNTER — Ambulatory Visit (HOSPITAL_BASED_OUTPATIENT_CLINIC_OR_DEPARTMENT_OTHER)
Admission: RE | Admit: 2024-01-01 | Discharge: 2024-01-01 | Disposition: A | Discharge status: Home / Self Care | Source: Ambulatory Visit | Attending: Family Medicine | Admitting: Family Medicine

## 2024-01-01 DIAGNOSIS — Z853 Personal history of malignant neoplasm of breast: Secondary | ICD-10-CM | POA: Insufficient documentation

## 2024-01-01 DIAGNOSIS — Z780 Asymptomatic menopausal state: Secondary | ICD-10-CM | POA: Diagnosis not present

## 2024-01-01 DIAGNOSIS — M8589 Other specified disorders of bone density and structure, multiple sites: Secondary | ICD-10-CM | POA: Diagnosis not present

## 2024-01-05 ENCOUNTER — Other Ambulatory Visit: Payer: Self-pay

## 2024-01-05 ENCOUNTER — Inpatient Hospital Stay

## 2024-01-05 ENCOUNTER — Inpatient Hospital Stay (HOSPITAL_BASED_OUTPATIENT_CLINIC_OR_DEPARTMENT_OTHER): Admitting: Hematology & Oncology

## 2024-01-05 ENCOUNTER — Encounter: Payer: Self-pay | Admitting: Hematology & Oncology

## 2024-01-05 VITALS — BP 109/73 | HR 76 | Temp 97.2°F | Resp 19 | Ht 64.0 in | Wt 137.2 lb

## 2024-01-05 DIAGNOSIS — C50911 Malignant neoplasm of unspecified site of right female breast: Secondary | ICD-10-CM

## 2024-01-05 DIAGNOSIS — M818 Other osteoporosis without current pathological fracture: Secondary | ICD-10-CM

## 2024-01-05 DIAGNOSIS — J9601 Acute respiratory failure with hypoxia: Secondary | ICD-10-CM

## 2024-01-05 DIAGNOSIS — C50011 Malignant neoplasm of nipple and areola, right female breast: Secondary | ICD-10-CM | POA: Diagnosis not present

## 2024-01-05 DIAGNOSIS — Z5112 Encounter for antineoplastic immunotherapy: Secondary | ICD-10-CM | POA: Diagnosis not present

## 2024-01-05 DIAGNOSIS — I4821 Permanent atrial fibrillation: Secondary | ICD-10-CM

## 2024-01-05 DIAGNOSIS — Z170 Estrogen receptor positive status [ER+]: Secondary | ICD-10-CM | POA: Diagnosis not present

## 2024-01-05 DIAGNOSIS — E782 Mixed hyperlipidemia: Secondary | ICD-10-CM

## 2024-01-05 LAB — CBC WITH DIFFERENTIAL (CANCER CENTER ONLY)
Abs Immature Granulocytes: 0 K/uL (ref 0.00–0.07)
Basophils Absolute: 0.1 K/uL (ref 0.0–0.1)
Basophils Relative: 2 %
Eosinophils Absolute: 0.1 K/uL (ref 0.0–0.5)
Eosinophils Relative: 3 %
HCT: 33.2 % — ABNORMAL LOW (ref 36.0–46.0)
Hemoglobin: 11.2 g/dL — ABNORMAL LOW (ref 12.0–15.0)
Immature Granulocytes: 0 %
Lymphocytes Relative: 41 %
Lymphs Abs: 1.5 K/uL (ref 0.7–4.0)
MCH: 36.2 pg — ABNORMAL HIGH (ref 26.0–34.0)
MCHC: 33.7 g/dL (ref 30.0–36.0)
MCV: 107.4 fL — ABNORMAL HIGH (ref 80.0–100.0)
Monocytes Absolute: 0.4 K/uL (ref 0.1–1.0)
Monocytes Relative: 11 %
Neutro Abs: 1.6 K/uL — ABNORMAL LOW (ref 1.7–7.7)
Neutrophils Relative %: 43 %
Platelet Count: 157 K/uL (ref 150–400)
RBC: 3.09 MIL/uL — ABNORMAL LOW (ref 3.87–5.11)
RDW: 12.6 % (ref 11.5–15.5)
Smear Review: NORMAL
WBC Count: 3.8 K/uL — ABNORMAL LOW (ref 4.0–10.5)
nRBC: 0 % (ref 0.0–0.2)

## 2024-01-05 LAB — CMP (CANCER CENTER ONLY)
ALT: 19 U/L (ref 0–44)
AST: 29 U/L (ref 15–41)
Albumin: 3.9 g/dL (ref 3.5–5.0)
Alkaline Phosphatase: 59 U/L (ref 38–126)
Anion gap: 12 (ref 5–15)
BUN: 6 mg/dL — ABNORMAL LOW (ref 8–23)
CO2: 27 mmol/L (ref 22–32)
Calcium: 9.2 mg/dL (ref 8.9–10.3)
Chloride: 101 mmol/L (ref 98–111)
Creatinine: 0.67 mg/dL (ref 0.44–1.00)
GFR, Estimated: >60 mL/min (ref >60)
Glucose, Bld: 106 mg/dL — ABNORMAL HIGH (ref 70–99)
Potassium: 3.4 mmol/L — ABNORMAL LOW (ref 3.5–5.1)
Sodium: 139 mmol/L (ref 135–145)
Total Bilirubin: 0.8 mg/dL (ref 0.0–1.2)
Total Protein: 6.6 g/dL (ref 6.5–8.1)

## 2024-01-05 MED ORDER — DIPHENHYDRAMINE HCL 25 MG PO CAPS
50.0000 mg | ORAL_CAPSULE | Freq: Once | ORAL | Status: AC
Start: 2024-01-05 — End: 2024-01-05
  Administered 2024-01-05: 50 mg via ORAL
  Filled 2024-01-05: qty 2

## 2024-01-05 MED ORDER — APIXABAN 5 MG PO TABS: 5.0000 mg | ORAL_TABLET | Freq: Two times a day (BID) | ORAL | 5 refills | Status: DC

## 2024-01-05 MED ORDER — SODIUM CHLORIDE 0.9 % IV SOLN
INTRAVENOUS | Status: DC
Start: 2024-01-05 — End: 2024-01-05

## 2024-01-05 MED ORDER — SODIUM CHLORIDE 0.9 % IV SOLN
220.0000 mg | Freq: Once | INTRAVENOUS | Status: AC
  Administered 2024-01-05: 220 mg via INTRAVENOUS
  Filled 2024-01-05: qty 8

## 2024-01-05 MED ORDER — SODIUM CHLORIDE 0.9% FLUSH
10.0000 mL | INTRAVENOUS | Status: DC | PRN
  Administered 2024-01-05: 10 mL

## 2024-01-05 MED ORDER — PROCHLORPERAZINE MALEATE 10 MG PO TABS
10.0000 mg | ORAL_TABLET | Freq: Once | ORAL | Status: AC
  Administered 2024-01-05: 10 mg via ORAL
  Filled 2024-01-05: qty 1

## 2024-01-05 MED ORDER — HEPARIN SOD (PORK) LOCK FLUSH 100 UNIT/ML IV SOLN
500.0000 [IU] | Freq: Once | INTRAVENOUS | Status: AC | PRN
  Administered 2024-01-05: 500 [IU]

## 2024-01-05 MED ORDER — ACETAMINOPHEN 325 MG PO TABS
650.0000 mg | ORAL_TABLET | Freq: Once | ORAL | Status: AC
  Administered 2024-01-05: 650 mg via ORAL
  Filled 2024-01-05: qty 2

## 2024-01-05 NOTE — Patient Instructions (Signed)
 [facility] - A DEPT OF [facility]  Discharge Instructions: Thank you for choosing [facility] to provide your oncology and hematology care.   If you have a lab appointment with the Cancer Center, please go directly to the Cancer Center and check in at the registration area.  Wear comfortable clothing and clothing appropriate for easy access to any Portacath or PICC line.   We strive to give you quality time with your provider. You may need to reschedule your appointment if you arrive late (15 or more minutes).  Arriving late affects you and other patients whose appointments are after yours.  Also, if you miss three or more appointments without notifying the office, you may be dismissed from the clinic at the provider's discretion.      For prescription refill requests, have your pharmacy contact our office and allow 72 hours for refills to be completed.    Today you received the following chemotherapy and/or immunotherapy agents Kadcyla      To help prevent nausea and vomiting after your treatment, we encourage you to take your nausea medication as directed.  BELOW ARE SYMPTOMS THAT SHOULD BE REPORTED IMMEDIATELY: *FEVER GREATER THAN 100.4 F (38 C) OR HIGHER *CHILLS OR SWEATING *NAUSEA AND VOMITING THAT IS NOT CONTROLLED WITH YOUR NAUSEA MEDICATION *UNUSUAL SHORTNESS OF BREATH *UNUSUAL BRUISING OR BLEEDING *URINARY PROBLEMS (pain or burning when urinating, or frequent urination) *BOWEL PROBLEMS (unusual diarrhea, constipation, pain near the anus) TENDERNESS IN MOUTH AND THROAT WITH OR WITHOUT PRESENCE OF ULCERS (sore throat, sores in mouth, or a toothache) UNUSUAL RASH, SWELLING OR PAIN  UNUSUAL VAGINAL DISCHARGE OR ITCHING   Items with * indicate a potential emergency and should be followed up as soon as possible or go to the Emergency Department if any problems should occur.  Please show the CHEMOTHERAPY ALERT CARD or IMMUNOTHERAPY  ALERT CARD at check-in to the Emergency Department and triage nurse. Should you have questions after your visit or need to cancel or reschedule your appointment, please contact [facility] - A DEPT OF [facility]  [phone] and follow the prompts.  Office hours are 8:00 a.m. to 4:30 p.m. Monday - Friday. Please note that voicemails left after 4:00 p.m. may not be returned until the following business day.  We are closed weekends and major holidays. You have access to a nurse at all times for urgent questions. Please call the main number to the clinic [phone] and follow the prompts.  For any non-urgent questions, you may also contact your provider using MyChart. We now offer e-Visits for anyone 87 and older to request care online for non-urgent symptoms. For details visit [email or web address].   Also download the MyChart app! Go to the app store, search "MyChart", open the app, select [facility], and log in with your MyChart username and password.

## 2024-01-05 NOTE — Patient Instructions (Signed)

## 2024-01-05 NOTE — Progress Notes (Signed)
 Decrease Kadcyla  to 220mg  per MD.  Bridgett Leotis Helling, RPH, BCPS, BCOP 01/05/2024 10:21 AM

## 2024-01-05 NOTE — Telephone Encounter (Signed)
 Prescription refill request for Eliquis  received. Indication:afib Last office visit:5/25 Scr:0.73  7/25 Age: 73 Weight:64  kg  Prescription refilled

## 2024-01-05 NOTE — Progress Notes (Signed)
 Hematology and Oncology Follow Up Visit  Rhonda Paul [id number] [birth year] 73 y.o. 01/05/2024   Principle Diagnosis:  Stage IA (T1cN0M0) invasive ductal carcinoma of the right breast -- ER+/PR-/HER2+ Remote history of stage I-triple negative-cancer of the right breast-2004  Current Therapy:   Status post right mastectomy --07/04/2023 Adjuvant Carbo/Taxotere /Herceptin / --s/pcycle #4/4 -- start on 08/11/2023 Kadcyla  3.6 mg/m2 IV q 3 weeks -- start on 11/24/2023 Femara  2.5 mg po q day -- start on 11/18/2023     Interim History:  Ms. Stahle is back for follow-up.  At her 73nd birthday this past Saturday.  She had a wonderful day.  She had a wonderful meal.  I think couple 2 days ago, she cut her index finger on the left hand.  Thankfully, this was not too serious.  However, she did require sutures.  There is no infection.  She was not put on any antibiotics.  Otherwise, everything seems to be going pretty well.  She and her husband who are going up to [place]  to visit family and to go to their cottage on [facility] [place] .  I know that they will have a wonderful time with her.  She has had no fever.  She has had no problems with cough or shortness of breath.  She has had no nausea or vomiting.  There is been no bleeding.  She has had no leg swelling.  There is been no rashes.  She continues on Eliquis  for the atrial fibrillation.  She is on Femara .  She had a bone density test done.  This was done on 01/01/2024.  This did show some osteopenia.  We may need to think about getting her on Xgeva or Zometa.  I will have to talk to her about this.  Overall, I would say that her performance status is probably ECOG 1.      Wt Readings from Last 3 Encounters:  01/05/24 137 lb 3.2 oz (62.2 kg)  12/15/23 141 lb 1.6 oz (64 kg)  11/17/23 151 lb 1.9 oz (68.5 kg)     Medications:  Current Outpatient Medications:    apixaban  (ELIQUIS ) 5 MG TABS tablet, Take 1 tablet (5 mg total) by mouth 2 (two)  times daily., Disp: 60 tablet, Rfl: 5   atorvastatin  (LIPITOR ) 40 MG tablet, TAKE ONE TABLET BY MOUTH DAILY, Disp: 90 tablet, Rfl: 3   Biotin w/ Vitamins C & E (HAIR/SKIN/NAILS PO), Take 1 capsule by mouth daily., Disp: , Rfl:    dapagliflozin  propanediol (FARXIGA ) 10 MG TABS tablet, Take 1 tablet (10 mg total) by mouth daily before breakfast., Disp: , Rfl:    ezetimibe  (ZETIA ) 10 MG tablet, Take 1 tablet (10 mg total) by mouth daily., Disp: 90 tablet, Rfl: 3   letrozole  (FEMARA ) 2.5 MG tablet, Take 1 tablet (2.5 mg total) by mouth daily., Disp: 30 tablet, Rfl: 12   levothyroxine  (SYNTHROID ) 50 MCG tablet, TAKE 1 TABLET BY MOUTH DAILY, Disp: 90 tablet, Rfl: 2   lidocaine -prilocaine  (EMLA ) cream, Apply 1 Application topically as needed. Apply one hour prior to port access and cover with plastic wrap, Disp: 30 g, Rfl: 0   metoprolol  succinate (TOPROL -XL) 50 MG 24 hr tablet, Take 1 tablet (50 mg total) by mouth daily. Take with or immediately following a meal., Disp: 90 tablet, Rfl: 3   NON FORMULARY, Pt uses cpap nightly, Disp: , Rfl:    ipratropium (ATROVENT ) 0.03 % nasal spray, Place 1 spray into both nostrils daily. (Patient not taking: Reported on 01/05/2024),  Disp: , Rfl:    nitroGLYCERIN  (NITROSTAT ) 0.4 MG SL tablet, Place 1 tablet (0.4 mg total) under the tongue every 5 (five) minutes as needed for chest pain. (Patient not taking: Reported on 01/05/2024), Disp: 25 tablet, Rfl: 3  Allergies:  Allergies  Allergen Reactions   Lambs Quarters Hives, Itching and Nausea And Vomiting   Entresto  [Sacubitril -Valsartan ] Other (See Comments)    Low BP   Lanolin Acid [Lanolin] Rash   Other Itching and Rash    wool    Past Medical History, Surgical history, Social history, and Family History were reviewed and updated.  Review of Systems: Review of Systems  Constitutional: Negative.   HENT:  Negative.    Eyes: Negative.   Respiratory: Negative.    Cardiovascular: Negative.   Gastrointestinal:  Negative.   Endocrine: Negative.   Genitourinary: Negative.  Negative for difficulty urinating.   Musculoskeletal: Negative.   Skin: Negative.   Neurological: Negative.   Hematological: Negative.   Psychiatric/Behavioral: Negative.      Physical Exam:  height is 5' 4 (1.626 m) and weight is 137 lb 3.2 oz (62.2 kg). Her oral temperature is 97.2 F (36.2 C) (abnormal). Her blood pressure is 109/73 and her pulse is 76. Her respiration is 19 and oxygen saturation is 100%.   Wt Readings from Last 3 Encounters:  01/05/24 137 lb 3.2 oz (62.2 kg)  12/15/23 141 lb 1.6 oz (64 kg)  11/17/23 151 lb 1.9 oz (68.5 kg)    Physical Exam Vitals reviewed.  HENT:     Head: Normocephalic and atraumatic.  Eyes:     Pupils: Pupils are equal, round, and reactive to light.  Cardiovascular:     Rate and Rhythm: Normal rate and regular rhythm.     Heart sounds: Normal heart sounds.  Pulmonary:     Effort: Pulmonary effort is normal.     Breath sounds: Normal breath sounds.  Abdominal:     General: Bowel sounds are normal.     Palpations: Abdomen is soft.  Musculoskeletal:        General: No tenderness or deformity. Normal range of motion.     Cervical back: Normal range of motion.  Lymphadenopathy:     Cervical: No cervical adenopathy.  Skin:    General: Skin is warm and dry.     Findings: No erythema or rash.  Neurological:     Mental Status: She is alert and oriented to person, place, and time.  Psychiatric:        Behavior: Behavior normal.        Thought Content: Thought content normal.        Judgment: Judgment normal.      Lab Results  Component Value Date   WBC 3.8 (L) 01/05/2024   HGB 11.2 (L) 01/05/2024   HCT 33.2 (L) 01/05/2024   MCV 107.4 (H) 01/05/2024   PLT 157 01/05/2024     Chemistry      Component Value Date/Time   NA 139 01/05/2024 0851   NA 141 03/24/2023 0832   NA 142 01/30/2017 0847   NA 140 02/01/2016 0841   K 3.4 (L) 01/05/2024 0851   K 4.9 (H)  01/30/2017 0847   K 4.8 02/01/2016 0841   CL 101 01/05/2024 0851   CL 104 01/30/2017 0847   CO2 27 01/05/2024 0851   CO2 32 01/30/2017 0847   CO2 27 02/01/2016 0841   BUN 6 (L) 01/05/2024 0851   BUN 9 03/24/2023 9167  BUN 11 01/30/2017 0847   BUN 12.7 02/01/2016 0841   CREATININE 0.67 01/05/2024 0851   CREATININE 1.0 01/30/2017 0847   CREATININE 0.8 02/01/2016 0841      Component Value Date/Time   CALCIUM  9.2 01/05/2024 0851   CALCIUM  9.6 01/30/2017 0847   CALCIUM  10.0 02/01/2016 0841   ALKPHOS 59 01/05/2024 0851   ALKPHOS 51 01/30/2017 0847   ALKPHOS 59 02/01/2016 0841   AST 29 01/05/2024 0851   AST 22 02/01/2016 0841   ALT 19 01/05/2024 0851   ALT 25 01/30/2017 0847   ALT 25 02/01/2016 0841   BILITOT 0.8 01/05/2024 0851   BILITOT 0.93 02/01/2016 0841      Impression and Plan: Ms. Chaidez is a very nice 73 year old white female.  She had a remote history of early-stage breast cancer of the right breast.  She had adjuvant chemotherapy for that.  Now, she has a new breast cancer in the right breast.  This cancer is HER2 positive. ER positive. We will subsequently go ahead and treat her with anti-HER2 therapy with the chemotherapy in the adjuvant setting.  She has stage I disease.  Thankfully, she had a negative lymph node.  So far, everything seems to be going quite well.  She continues on the Kadcyla  and Femara .  I did talk to her about therapy for the osteopenia.  I recommended either Prolia twice a year or Reclast once a year.  She will think about this when she is on vacation.  Everything looks fantastic.  She will continue her Kadcyla .  I will plan to get her back in another 3 weeks.  Maude JONELLE Crease, MD 7/28/20259:30 AM

## 2024-01-13 ENCOUNTER — Encounter: Payer: Self-pay | Admitting: *Deleted

## 2024-01-13 NOTE — Progress Notes (Signed)
 Patient received cycle three Kadcyla . She continues on Femara .   Oncology Nurse Navigator Documentation     01/13/2024    7:30 AM  Oncology Nurse Navigator Flowsheets  Navigator Follow Up Date: 01/27/2024  Navigator Follow Up Reason: Follow-up Appointment;Chemotherapy  Navigator Location [facility]  Navigator Encounter Type Appt/Treatment Plan Review  Patient Visit Type MedOnc  Treatment Phase Active Tx  Barriers/Navigation Needs No Barriers At This Time  Interventions None Required  Acuity Level 1-No Barriers  Support Groups/Services Friends and Family  Time Spent with Patient 15

## 2024-01-22 DIAGNOSIS — G4733 Obstructive sleep apnea (adult) (pediatric): Secondary | ICD-10-CM | POA: Diagnosis not present

## 2024-01-26 ENCOUNTER — Other Ambulatory Visit: Payer: Self-pay | Admitting: Hematology & Oncology

## 2024-01-26 ENCOUNTER — Ambulatory Visit

## 2024-01-26 ENCOUNTER — Inpatient Hospital Stay: Attending: Hematology & Oncology

## 2024-01-26 ENCOUNTER — Ambulatory Visit: Admitting: Hematology & Oncology

## 2024-01-26 ENCOUNTER — Other Ambulatory Visit

## 2024-01-27 ENCOUNTER — Inpatient Hospital Stay

## 2024-01-27 ENCOUNTER — Inpatient Hospital Stay (HOSPITAL_BASED_OUTPATIENT_CLINIC_OR_DEPARTMENT_OTHER): Admitting: Medical Oncology

## 2024-01-27 ENCOUNTER — Encounter: Payer: Self-pay | Admitting: Medical Oncology

## 2024-01-27 ENCOUNTER — Inpatient Hospital Stay: Attending: Hematology & Oncology

## 2024-01-27 ENCOUNTER — Encounter: Payer: Self-pay | Admitting: *Deleted

## 2024-01-27 VITALS — BP 123/73 | HR 75

## 2024-01-27 VITALS — BP 124/77 | HR 92 | Temp 98.5°F | Resp 18 | Ht 64.0 in | Wt 132.0 lb

## 2024-01-27 DIAGNOSIS — Z170 Estrogen receptor positive status [ER+]: Secondary | ICD-10-CM

## 2024-01-27 DIAGNOSIS — C50011 Malignant neoplasm of nipple and areola, right female breast: Secondary | ICD-10-CM

## 2024-01-27 DIAGNOSIS — C50911 Malignant neoplasm of unspecified site of right female breast: Secondary | ICD-10-CM | POA: Diagnosis not present

## 2024-01-27 DIAGNOSIS — Z5112 Encounter for antineoplastic immunotherapy: Secondary | ICD-10-CM | POA: Insufficient documentation

## 2024-01-27 DIAGNOSIS — Z1731 Human epidermal growth factor receptor 2 positive status: Secondary | ICD-10-CM | POA: Insufficient documentation

## 2024-01-27 DIAGNOSIS — C50811 Malignant neoplasm of overlapping sites of right female breast: Secondary | ICD-10-CM | POA: Diagnosis present

## 2024-01-27 DIAGNOSIS — J9601 Acute respiratory failure with hypoxia: Secondary | ICD-10-CM

## 2024-01-27 LAB — CBC WITH DIFFERENTIAL (CANCER CENTER ONLY)
Abs Immature Granulocytes: 0.01 K/uL (ref 0.00–0.07)
Basophils Absolute: 0 K/uL (ref 0.0–0.1)
Basophils Relative: 1 %
Eosinophils Absolute: 0.1 K/uL (ref 0.0–0.5)
Eosinophils Relative: 3 %
HCT: 34.9 % — ABNORMAL LOW (ref 36.0–46.0)
Hemoglobin: 11.8 g/dL — ABNORMAL LOW (ref 12.0–15.0)
Immature Granulocytes: 0 %
Lymphocytes Relative: 41 %
Lymphs Abs: 1.5 K/uL (ref 0.7–4.0)
MCH: 35.4 pg — ABNORMAL HIGH (ref 26.0–34.0)
MCHC: 33.8 g/dL (ref 30.0–36.0)
MCV: 104.8 fL — ABNORMAL HIGH (ref 80.0–100.0)
Monocytes Absolute: 0.4 K/uL (ref 0.1–1.0)
Monocytes Relative: 11 %
Neutro Abs: 1.6 K/uL — ABNORMAL LOW (ref 1.7–7.7)
Neutrophils Relative %: 44 %
Platelet Count: 167 K/uL (ref 150–400)
RBC: 3.33 MIL/uL — ABNORMAL LOW (ref 3.87–5.11)
RDW: 12.8 % (ref 11.5–15.5)
WBC Count: 3.7 K/uL — ABNORMAL LOW (ref 4.0–10.5)
nRBC: 0 % (ref 0.0–0.2)

## 2024-01-27 LAB — CMP (CANCER CENTER ONLY)
ALT: 26 U/L (ref 0–44)
AST: 36 U/L (ref 15–41)
Albumin: 3.9 g/dL (ref 3.5–5.0)
Alkaline Phosphatase: 63 U/L (ref 38–126)
Anion gap: 13 (ref 5–15)
BUN: <5 mg/dL — ABNORMAL LOW (ref 8–23)
CO2: 26 mmol/L (ref 22–32)
Calcium: 9.2 mg/dL (ref 8.9–10.3)
Chloride: 102 mmol/L (ref 98–111)
Creatinine: 0.57 mg/dL (ref 0.44–1.00)
GFR, Estimated: >60 mL/min (ref >60)
Glucose, Bld: 91 mg/dL (ref 70–99)
Potassium: 3.7 mmol/L (ref 3.5–5.1)
Sodium: 141 mmol/L (ref 135–145)
Total Bilirubin: 0.8 mg/dL (ref 0.0–1.2)
Total Protein: 6.8 g/dL (ref 6.5–8.1)

## 2024-01-27 LAB — LACTATE DEHYDROGENASE: LDH: 219 U/L — ABNORMAL HIGH (ref 98–192)

## 2024-01-27 MED ORDER — DIPHENHYDRAMINE HCL 25 MG PO CAPS
50.0000 mg | ORAL_CAPSULE | Freq: Once | ORAL | Status: AC
  Administered 2024-01-27: 50 mg via ORAL
  Filled 2024-01-27: qty 2

## 2024-01-27 MED ORDER — SODIUM CHLORIDE 0.9 % IV SOLN
INTRAVENOUS | Status: DC
Start: 2024-01-27 — End: 2024-01-27

## 2024-01-27 MED ORDER — ACETAMINOPHEN 325 MG PO TABS
650.0000 mg | ORAL_TABLET | Freq: Once | ORAL | Status: AC
  Administered 2024-01-27: 650 mg via ORAL
  Filled 2024-01-27: qty 2

## 2024-01-27 MED ORDER — SODIUM CHLORIDE 0.9 % IV SOLN
3.6000 mg/kg | Freq: Once | INTRAVENOUS | Status: AC
  Administered 2024-01-27: 220 mg via INTRAVENOUS
  Filled 2024-01-27: qty 8

## 2024-01-27 MED ORDER — PROCHLORPERAZINE MALEATE 10 MG PO TABS
10.0000 mg | ORAL_TABLET | Freq: Once | ORAL | Status: AC
Start: 2024-01-27 — End: 2024-01-27
  Administered 2024-01-27: 10 mg via ORAL
  Filled 2024-01-27: qty 1

## 2024-01-27 NOTE — Progress Notes (Signed)
 Hematology and Oncology Follow Up Visit  Rhonda Paul [id number] 01-22-1951 73 y.o. 01/27/2024   Principle Diagnosis:  Stage IA (T1cN0M0) invasive ductal carcinoma of the right breast -- ER+/PR-/HER2+ Remote history of stage I-triple negative-cancer of the right breast-2004  Previous Therapy: Status post right mastectomy --07/04/2023 Adjuvant Carbo/Taxotere /Herceptin / --s/p cycle #4/4 -- start on 08/11/2023  Current Therapy:   Kadcyla  3.6 mg/m2 IV q 3 weeks -- start on 11/24/2023 Femara  2.5 mg po q day -- start on 11/18/2023     Interim History:  Rhonda Paul is back for follow-up.   Today she states that she has been well. She just got back from a wonderful vacation in [place] .   She has had no fever.  She has had no problems with cough or shortness of breath.  She has had no nausea or vomiting.  There is been no bleeding.  She has had no leg swelling.  There is been no rashes.  She continues on Eliquis  for the atrial fibrillation.  She is on Femara .  She had a bone density test done.  This was done on 01/01/2024.  This did show some osteopenia.   She is UTD on her dental visit.   Overall, I would say that her performance status is probably ECOG 1.      Wt Readings from Last 3 Encounters:  01/27/24 132 lb (59.9 kg)  01/05/24 137 lb 3.2 oz (62.2 kg)  12/15/23 141 lb 1.6 oz (64 kg)     Medications:  Current Outpatient Medications:    apixaban  (ELIQUIS ) 5 MG TABS tablet, Take 1 tablet (5 mg total) by mouth 2 (two) times daily., Disp: 60 tablet, Rfl: 5   atorvastatin  (LIPITOR ) 40 MG tablet, TAKE ONE TABLET BY MOUTH DAILY, Disp: 90 tablet, Rfl: 3   Biotin w/ Vitamins C & E (HAIR/SKIN/NAILS PO), Take 1 capsule by mouth daily., Disp: , Rfl:    Cholecalciferol 50 MCG (2000 UT) CAPS, Take 1 capsule by mouth daily., Disp: , Rfl:    dapagliflozin  propanediol (FARXIGA ) 10 MG TABS tablet, Take 1 tablet (10 mg total) by mouth daily before breakfast., Disp: , Rfl:    ezetimibe  (ZETIA )  10 MG tablet, Take 1 tablet (10 mg total) by mouth daily., Disp: 90 tablet, Rfl: 3   ipratropium (ATROVENT ) 0.03 % nasal spray, Place 1 spray into both nostrils daily., Disp: , Rfl:    letrozole  (FEMARA ) 2.5 MG tablet, Take 1 tablet (2.5 mg total) by mouth daily., Disp: 30 tablet, Rfl: 12   levothyroxine  (SYNTHROID ) 50 MCG tablet, TAKE 1 TABLET BY MOUTH DAILY, Disp: 90 tablet, Rfl: 2   lidocaine -prilocaine  (EMLA ) cream, Apply 1 Application topically as needed. Apply one hour prior to port access and cover with plastic wrap, Disp: 30 g, Rfl: 0   metoprolol  succinate (TOPROL -XL) 50 MG 24 hr tablet, Take 1 tablet (50 mg total) by mouth daily. Take with or immediately following a meal., Disp: 90 tablet, Rfl: 3   NON FORMULARY, Pt uses cpap nightly, Disp: , Rfl:    nitroGLYCERIN  (NITROSTAT ) 0.4 MG SL tablet, Place 1 tablet (0.4 mg total) under the tongue every 5 (five) minutes as needed for chest pain. (Patient not taking: Reported on 01/27/2024), Disp: 25 tablet, Rfl: 3 No current facility-administered medications for this visit.  Facility-Administered Medications Ordered in Other Visits:    0.9 %  sodium chloride  infusion, , Intravenous, Continuous, Ennever, Maude SAUNDERS, MD, Last Rate: 10 mL/hr at 01/27/24 0936, New Bag at 01/27/24 [phone]  ado-trastuzumab emtansine  (KADCYLA ) 220 mg in sodium chloride  0.9 % 250 mL chemo infusion, 3.6 mg/kg (Order-Specific), Intravenous, Once, Ennever, Maude SAUNDERS, MD  Allergies:  Allergies  Allergen Reactions   Lambs Quarters Hives, Itching and Nausea And Vomiting   Entresto  [Sacubitril -Valsartan ] Other (See Comments)    Low BP   Lanolin Acid [Lanolin] Rash   Other Itching and Rash    wool    Past Medical History, Surgical history, Social history, and Family History were reviewed and updated.  Review of Systems: Review of Systems  Constitutional: Negative.   HENT:  Negative.    Eyes: Negative.   Respiratory: Negative.    Cardiovascular: Negative.    Gastrointestinal: Negative.   Endocrine: Negative.   Genitourinary: Negative.  Negative for difficulty urinating.   Musculoskeletal: Negative.   Skin: Negative.   Neurological: Negative.   Hematological: Negative.   Psychiatric/Behavioral: Negative.      Physical Exam:  height is 5' 4 (1.626 m) and weight is 132 lb (59.9 kg). Her oral temperature is 98.5 F (36.9 C). Her blood pressure is 124/77 and her pulse is 92. Her respiration is 18 and oxygen saturation is 99%.   Wt Readings from Last 3 Encounters:  01/27/24 132 lb (59.9 kg)  01/05/24 137 lb 3.2 oz (62.2 kg)  12/15/23 141 lb 1.6 oz (64 kg)    Physical Exam Vitals reviewed.  HENT:     Head: Normocephalic and atraumatic.  Eyes:     Pupils: Pupils are equal, round, and reactive to light.  Cardiovascular:     Rate and Rhythm: Normal rate and regular rhythm.     Heart sounds: Normal heart sounds.  Pulmonary:     Effort: Pulmonary effort is normal.     Breath sounds: Normal breath sounds.  Abdominal:     General: Bowel sounds are normal.     Palpations: Abdomen is soft.  Musculoskeletal:        General: No tenderness or deformity. Normal range of motion.     Cervical back: Normal range of motion.  Lymphadenopathy:     Cervical: No cervical adenopathy.  Skin:    General: Skin is warm and dry.     Findings: No erythema or rash.  Neurological:     Mental Status: She is alert and oriented to person, place, and time.  Psychiatric:        Behavior: Behavior normal.        Thought Content: Thought content normal.        Judgment: Judgment normal.      Lab Results  Component Value Date   WBC 3.7 (L) 01/27/2024   HGB 11.8 (L) 01/27/2024   HCT 34.9 (L) 01/27/2024   MCV 104.8 (H) 01/27/2024   PLT 167 01/27/2024     Chemistry      Component Value Date/Time   NA 141 01/27/2024 0848   NA 141 03/24/2023 0832   NA 142 01/30/2017 0847   NA 140 02/01/2016 0841   K 3.7 01/27/2024 0848   K 4.9 (H) 01/30/2017 0847    K 4.8 02/01/2016 0841   CL 102 01/27/2024 0848   CL 104 01/30/2017 0847   CO2 26 01/27/2024 0848   CO2 32 01/30/2017 0847   CO2 27 02/01/2016 0841   BUN <5 (L) 01/27/2024 0848   BUN 9 03/24/2023 0832   BUN 11 01/30/2017 0847   BUN 12.7 02/01/2016 0841   CREATININE 0.57 01/27/2024 0848   CREATININE 1.0 01/30/2017 0847  CREATININE 0.8 02/01/2016 0841      Component Value Date/Time   CALCIUM  9.2 01/27/2024 0848   CALCIUM  9.6 01/30/2017 0847   CALCIUM  10.0 02/01/2016 0841   ALKPHOS 63 01/27/2024 0848   ALKPHOS 51 01/30/2017 0847   ALKPHOS 59 02/01/2016 0841   AST 36 01/27/2024 0848   AST 22 02/01/2016 0841   ALT 26 01/27/2024 0848   ALT 25 01/30/2017 0847   ALT 25 02/01/2016 0841   BILITOT 0.8 01/27/2024 0848   BILITOT 0.93 02/01/2016 0841     Encounter Diagnosis  Name Primary?   Breast cancer, stage 1, estrogen receptor positive, right (HCC) Yes    Impression and Plan: Ms. Starkel is a very nice 73 year old white female.  She had a remote history of early-stage breast cancer of the right breast.  She had adjuvant chemotherapy for that.  Now, she has a new breast cancer in the right breast.  This cancer is HER2 positive. ER positive. We will subsequently go ahead and treat her with anti-HER2 therapy with the chemotherapy in the adjuvant setting.  She continues on the Kadcyla  and Femara .  CBC and CMP look good today.   At her last visit she discussed Prolia twice a year or Reclast once a year with Dr. Timmy for her oestopenia. She wishes to hold off and discuss this more later.    Treatment today RTC 3 weeks MD, port labs, Infusion   Lauraine HERO Montfort, PA-C 8/19/20259:45 AM

## 2024-01-27 NOTE — Progress Notes (Signed)
 Patient will proceed with cycle four.   Oncology Nurse Navigator Documentation     01/27/2024   10:30 AM  Oncology Nurse Navigator Flowsheets  Navigator Follow Up Date: 02/16/2024  Navigator Follow Up Reason: Follow-up Appointment;Chemotherapy  Navigator Location [facility]  Navigator Encounter Type Follow-up Appt  Patient Visit Type MedOnc  Treatment Phase Active Tx  Barriers/Navigation Needs No Barriers At This Time  Interventions None Required  Acuity Level 1-No Barriers  Support Groups/Services Friends and Family  Time Spent with Patient 15

## 2024-02-10 ENCOUNTER — Encounter: Payer: Self-pay | Admitting: *Deleted

## 2024-02-10 DIAGNOSIS — C50911 Malignant neoplasm of unspecified site of right female breast: Secondary | ICD-10-CM

## 2024-02-10 NOTE — Progress Notes (Signed)
 Patient needs follow up ECHO after her last one showed decreased EF. Spoke to Dr Timmy and he gave verbal order for ECHO. Order placed.  Oncology Nurse Navigator Documentation     02/10/2024    8:00 AM  Oncology Nurse Navigator Flowsheets  Navigator Follow Up Date: 02/16/2024  Navigator Follow Up Reason: Follow-up Appointment;Chemotherapy  Navigator Location [facility]  Navigator Encounter Type Appt/Treatment Plan Review  Patient Visit Type MedOnc  Treatment Phase Active Tx  Barriers/Navigation Needs No Barriers At This Time  Interventions Coordination of Care  Acuity Level 1-No Barriers  Support Groups/Services Friends and Family  Time Spent with Patient 15

## 2024-02-12 ENCOUNTER — Ambulatory Visit: Payer: Self-pay | Admitting: Hematology & Oncology

## 2024-02-12 ENCOUNTER — Ambulatory Visit (HOSPITAL_BASED_OUTPATIENT_CLINIC_OR_DEPARTMENT_OTHER)
Admission: RE | Admit: 2024-02-12 | Discharge: 2024-02-12 | Disposition: A | Discharge status: Home / Self Care | Source: Ambulatory Visit | Attending: Hematology & Oncology | Admitting: Hematology & Oncology

## 2024-02-12 DIAGNOSIS — Z01818 Encounter for other preprocedural examination: Secondary | ICD-10-CM | POA: Diagnosis not present

## 2024-02-12 DIAGNOSIS — Z0189 Encounter for other specified special examinations: Secondary | ICD-10-CM

## 2024-02-12 DIAGNOSIS — I252 Old myocardial infarction: Secondary | ICD-10-CM | POA: Diagnosis not present

## 2024-02-12 DIAGNOSIS — C50911 Malignant neoplasm of unspecified site of right female breast: Secondary | ICD-10-CM | POA: Insufficient documentation

## 2024-02-12 DIAGNOSIS — I2510 Atherosclerotic heart disease of native coronary artery without angina pectoris: Secondary | ICD-10-CM | POA: Insufficient documentation

## 2024-02-12 DIAGNOSIS — Z170 Estrogen receptor positive status [ER+]: Secondary | ICD-10-CM | POA: Diagnosis not present

## 2024-02-12 DIAGNOSIS — E785 Hyperlipidemia, unspecified: Secondary | ICD-10-CM | POA: Diagnosis not present

## 2024-02-12 DIAGNOSIS — I4891 Unspecified atrial fibrillation: Secondary | ICD-10-CM | POA: Insufficient documentation

## 2024-02-12 DIAGNOSIS — I10 Essential (primary) hypertension: Secondary | ICD-10-CM | POA: Diagnosis not present

## 2024-02-12 LAB — ECHOCARDIOGRAM LIMITED
Area-P 1/2: 5.48 cm2
Calc EF: 56.3 %
S' Lateral: 3 cm
Single Plane A2C EF: 49.8 %
Single Plane A4C EF: 57.4 %

## 2024-02-16 ENCOUNTER — Encounter: Payer: Self-pay | Admitting: Hematology & Oncology

## 2024-02-16 ENCOUNTER — Inpatient Hospital Stay: Attending: Hematology & Oncology

## 2024-02-16 ENCOUNTER — Inpatient Hospital Stay

## 2024-02-16 ENCOUNTER — Inpatient Hospital Stay (HOSPITAL_BASED_OUTPATIENT_CLINIC_OR_DEPARTMENT_OTHER): Admitting: Hematology & Oncology

## 2024-02-16 ENCOUNTER — Encounter: Payer: Self-pay | Admitting: *Deleted

## 2024-02-16 VITALS — BP 122/64 | HR 67 | Temp 98.6°F | Resp 18 | Ht 64.0 in | Wt 135.1 lb

## 2024-02-16 DIAGNOSIS — Z5112 Encounter for antineoplastic immunotherapy: Secondary | ICD-10-CM | POA: Diagnosis present

## 2024-02-16 DIAGNOSIS — C50911 Malignant neoplasm of unspecified site of right female breast: Secondary | ICD-10-CM | POA: Diagnosis not present

## 2024-02-16 DIAGNOSIS — Z170 Estrogen receptor positive status [ER+]: Secondary | ICD-10-CM

## 2024-02-16 DIAGNOSIS — Z1731 Human epidermal growth factor receptor 2 positive status: Secondary | ICD-10-CM | POA: Insufficient documentation

## 2024-02-16 LAB — CBC WITH DIFFERENTIAL (CANCER CENTER ONLY)
Abs Immature Granulocytes: 0.01 K/uL (ref 0.00–0.07)
Basophils Absolute: 0.1 K/uL (ref 0.0–0.1)
Basophils Relative: 1 %
Eosinophils Absolute: 0.1 K/uL (ref 0.0–0.5)
Eosinophils Relative: 3 %
HCT: 35.3 % — ABNORMAL LOW (ref 36.0–46.0)
Hemoglobin: 12 g/dL (ref 12.0–15.0)
Immature Granulocytes: 0 %
Lymphocytes Relative: 43 %
Lymphs Abs: 1.9 K/uL (ref 0.7–4.0)
MCH: 35.3 pg — ABNORMAL HIGH (ref 26.0–34.0)
MCHC: 34 g/dL (ref 30.0–36.0)
MCV: 103.8 fL — ABNORMAL HIGH (ref 80.0–100.0)
Monocytes Absolute: 0.5 K/uL (ref 0.1–1.0)
Monocytes Relative: 11 %
Neutro Abs: 1.9 K/uL (ref 1.7–7.7)
Neutrophils Relative %: 42 %
Platelet Count: 103 K/uL — ABNORMAL LOW (ref 150–400)
RBC: 3.4 MIL/uL — ABNORMAL LOW (ref 3.87–5.11)
RDW: 14.5 % (ref 11.5–15.5)
Smear Review: NORMAL
WBC Count: 4.4 K/uL (ref 4.0–10.5)
nRBC: 0 % (ref 0.0–0.2)

## 2024-02-16 LAB — CMP (CANCER CENTER ONLY)
ALT: 33 U/L (ref 0–44)
AST: 44 U/L — ABNORMAL HIGH (ref 15–41)
Albumin: 4 g/dL (ref 3.5–5.0)
Alkaline Phosphatase: 80 U/L (ref 38–126)
Anion gap: 12 (ref 5–15)
BUN: 7 mg/dL — ABNORMAL LOW (ref 8–23)
CO2: 26 mmol/L (ref 22–32)
Calcium: 9.3 mg/dL (ref 8.9–10.3)
Chloride: 101 mmol/L (ref 98–111)
Creatinine: 0.75 mg/dL (ref 0.44–1.00)
GFR, Estimated: >60 mL/min (ref >60)
Glucose, Bld: 114 mg/dL — ABNORMAL HIGH (ref 70–99)
Potassium: 3.6 mmol/L (ref 3.5–5.1)
Sodium: 139 mmol/L (ref 135–145)
Total Bilirubin: 1.3 mg/dL — ABNORMAL HIGH (ref 0.0–1.2)
Total Protein: 6.8 g/dL (ref 6.5–8.1)

## 2024-02-16 LAB — LACTATE DEHYDROGENASE: LDH: 260 U/L — ABNORMAL HIGH (ref 98–192)

## 2024-02-16 MED ORDER — ACETAMINOPHEN 325 MG PO TABS
650.0000 mg | ORAL_TABLET | Freq: Once | ORAL | Status: AC
  Administered 2024-02-16: 650 mg via ORAL
  Filled 2024-02-16: qty 2

## 2024-02-16 MED ORDER — DIPHENHYDRAMINE HCL 25 MG PO CAPS
50.0000 mg | ORAL_CAPSULE | Freq: Once | ORAL | Status: AC
  Administered 2024-02-16: 50 mg via ORAL
  Filled 2024-02-16: qty 2

## 2024-02-16 MED ORDER — PROCHLORPERAZINE MALEATE 10 MG PO TABS
10.0000 mg | ORAL_TABLET | Freq: Once | ORAL | Status: AC
  Administered 2024-02-16: 10 mg via ORAL
  Filled 2024-02-16: qty 1

## 2024-02-16 MED ORDER — SODIUM CHLORIDE 0.9 % IV SOLN
3.6000 mg/kg | Freq: Once | INTRAVENOUS | Status: AC
  Administered 2024-02-16: 220 mg via INTRAVENOUS
  Filled 2024-02-16: qty 8

## 2024-02-16 MED ORDER — SODIUM CHLORIDE 0.9 % IV SOLN: INTRAVENOUS | Status: DC

## 2024-02-16 NOTE — Progress Notes (Signed)
 Hematology and Oncology Follow Up Visit  Rhonda Paul [id number] 1950-07-30 73 y.o. 02/16/2024   Principle Diagnosis:  Stage IA (T1cN0M0) invasive ductal carcinoma of the right breast -- ER+/PR-/HER2+ Remote history of stage I-triple negative-cancer of the right breast-2004  Previous Therapy: Status post right mastectomy --07/04/2023 Adjuvant Carbo/Taxotere /Herceptin / --s/p cycle #4/4 -- start on 08/11/2023  Current Therapy:   Kadcyla  3.6 mg/m2 IV q 3 weeks -- s/p cycle #4 - start on 11/24/2023 Femara  2.5 mg po q day -- start on 11/18/2023 Prolia 60 mg SQ every 6 months     Interim History:  Ms. Rhonda Paul is back for follow-up.  She seems to be doing quite well.  She had another wonderful time open [place] .  We did do an echocardiogram on her.  This was done on 02/12/2024.  The echocardiogram showed that she had a very good ejection fraction of 55-60%.  She did have a large left atrium which is no surprise since she has atrial fibrillation.  She has had no problems with cough or shortness of breath.  She has had no change in bowel or bladder habits.  She has had no rashes.  There is been no leg swelling.  She has had no bleeding.  She is doing well on the Femara .  Overall, I will say her performance status is probably ECOG 1.      Wt Readings from Last 3 Encounters:  02/16/24 135 lb 1.9 oz (61.3 kg)  01/27/24 132 lb (59.9 kg)  01/05/24 137 lb 3.2 oz (62.2 kg)     Medications:  Current Outpatient Medications:    apixaban  (ELIQUIS ) 5 MG TABS tablet, Take 1 tablet (5 mg total) by mouth 2 (two) times daily., Disp: 60 tablet, Rfl: 5   atorvastatin  (LIPITOR ) 40 MG tablet, TAKE ONE TABLET BY MOUTH DAILY, Disp: 90 tablet, Rfl: 3   Biotin w/ Vitamins C & E (HAIR/SKIN/NAILS PO), Take 1 capsule by mouth daily., Disp: , Rfl:    Cholecalciferol 50 MCG (2000 UT) CAPS, Take 1 capsule by mouth daily., Disp: , Rfl:    dapagliflozin  propanediol (FARXIGA ) 10 MG TABS tablet, Take 1 tablet (10 mg  total) by mouth daily before breakfast., Disp: , Rfl:    ezetimibe  (ZETIA ) 10 MG tablet, Take 1 tablet (10 mg total) by mouth daily., Disp: 90 tablet, Rfl: 3   ipratropium (ATROVENT ) 0.03 % nasal spray, Place 1 spray into both nostrils daily., Disp: , Rfl:    letrozole  (FEMARA ) 2.5 MG tablet, Take 1 tablet (2.5 mg total) by mouth daily., Disp: 30 tablet, Rfl: 12   levothyroxine  (SYNTHROID ) 50 MCG tablet, TAKE 1 TABLET BY MOUTH DAILY, Disp: 90 tablet, Rfl: 2   lidocaine -prilocaine  (EMLA ) cream, Apply 1 Application topically as needed. Apply one hour prior to port access and cover with plastic wrap, Disp: 30 g, Rfl: 0   metoprolol  succinate (TOPROL -XL) 50 MG 24 hr tablet, Take 1 tablet (50 mg total) by mouth daily. Take with or immediately following a meal., Disp: 90 tablet, Rfl: 3   NON FORMULARY, Pt uses cpap nightly, Disp: , Rfl:    nitroGLYCERIN  (NITROSTAT ) 0.4 MG SL tablet, Place 1 tablet (0.4 mg total) under the tongue every 5 (five) minutes as needed for chest pain. (Patient not taking: Reported on 02/16/2024), Disp: 25 tablet, Rfl: 3  Allergies:  Allergies  Allergen Reactions   Lambs Quarters Hives, Itching and Nausea And Vomiting   Entresto  [Sacubitril -Valsartan ] Other (See Comments)    Low BP   Lanolin  Acid [Lanolin] Rash   Other Itching and Rash    wool    Past Medical History, Surgical history, Social history, and Family History were reviewed and updated.  Review of Systems: Review of Systems  Constitutional: Negative.   HENT:  Negative.    Eyes: Negative.   Respiratory: Negative.    Cardiovascular: Negative.   Gastrointestinal: Negative.   Endocrine: Negative.   Genitourinary: Negative.  Negative for difficulty urinating.   Musculoskeletal: Negative.   Skin: Negative.   Neurological: Negative.   Hematological: Negative.   Psychiatric/Behavioral: Negative.      Physical Exam:  height is 5' 4 (1.626 m) and weight is 135 lb 1.9 oz (61.3 kg). Her oral temperature is  98.3 F (36.8 C). Her blood pressure is 101/55 (abnormal) and her pulse is 69. Her respiration is 20 and oxygen saturation is 100%.   Wt Readings from Last 3 Encounters:  02/16/24 135 lb 1.9 oz (61.3 kg)  01/27/24 132 lb (59.9 kg)  01/05/24 137 lb 3.2 oz (62.2 kg)    Physical Exam Vitals reviewed.  HENT:     Head: Normocephalic and atraumatic.  Eyes:     Pupils: Pupils are equal, round, and reactive to light.  Cardiovascular:     Rate and Rhythm: Normal rate and regular rhythm.     Heart sounds: Normal heart sounds.  Pulmonary:     Effort: Pulmonary effort is normal.     Breath sounds: Normal breath sounds.  Abdominal:     General: Bowel sounds are normal.     Palpations: Abdomen is soft.  Musculoskeletal:        General: No tenderness or deformity. Normal range of motion.     Cervical back: Normal range of motion.  Lymphadenopathy:     Cervical: No cervical adenopathy.  Skin:    General: Skin is warm and dry.     Findings: No erythema or rash.  Neurological:     Mental Status: She is alert and oriented to person, place, and time.  Psychiatric:        Behavior: Behavior normal.        Thought Content: Thought content normal.        Judgment: Judgment normal.      Lab Results  Component Value Date   WBC 4.4 02/16/2024   HGB 12.0 02/16/2024   HCT 35.3 (L) 02/16/2024   MCV 103.8 (H) 02/16/2024   PLT 103 (L) 02/16/2024     Chemistry      Component Value Date/Time   NA 139 02/16/2024 0921   NA 141 03/24/2023 0832   NA 142 01/30/2017 0847   NA 140 02/01/2016 0841   K 3.6 02/16/2024 0921   K 4.9 (H) 01/30/2017 0847   K 4.8 02/01/2016 0841   CL 101 02/16/2024 0921   CL 104 01/30/2017 0847   CO2 26 02/16/2024 0921   CO2 32 01/30/2017 0847   CO2 27 02/01/2016 0841   BUN 7 (L) 02/16/2024 0921   BUN 9 03/24/2023 0832   BUN 11 01/30/2017 0847   BUN 12.7 02/01/2016 0841   CREATININE 0.75 02/16/2024 0921   CREATININE 1.0 01/30/2017 0847   CREATININE 0.8  02/01/2016 0841      Component Value Date/Time   CALCIUM  9.3 02/16/2024 0921   CALCIUM  9.6 01/30/2017 0847   CALCIUM  10.0 02/01/2016 0841   ALKPHOS 80 02/16/2024 0921   ALKPHOS 51 01/30/2017 0847   ALKPHOS 59 02/01/2016 0841   AST 44 (H)  02/16/2024 0921   AST 22 02/01/2016 0841   ALT 33 02/16/2024 0921   ALT 25 01/30/2017 0847   ALT 25 02/01/2016 0841   BILITOT 1.3 (H) 02/16/2024 0921   BILITOT 0.93 02/01/2016 0841      Impression and Plan: Ms. Aki is a very nice 73 year old white female.  She had a remote history of early-stage breast cancer of the right breast.  She had adjuvant chemotherapy for that.  Now, she has a new breast cancer in the right breast.  This cancer is HER2 positive. ER positive. We will subsequently go ahead and treat her with anti-HER2 therapy with the chemotherapy in the adjuvant setting.  She continues on the Kadcyla  and Femara .  I am glad that her echocardiogram looks great.  I did talk to her about Prolia.  I really think this would be a very good idea for her.  She really does not have any problems with osteoporosis.  I just think this is another avenue that we can use to try to minimize recurrent disease.  We will go ahead and plan to get her back in 3 weeks.  Maude JONELLE Crease, MD 9/8/202510:19 AM

## 2024-02-16 NOTE — Patient Instructions (Signed)
 [facility] - A DEPT OF [place]  Discharge Instructions: Thank you for choosing [facility] to provide your oncology and hematology care.   If you have a lab appointment with the Cancer Center, please go directly to the Cancer Center and check in at the registration area.  Wear comfortable clothing and clothing appropriate for easy access to any Portacath or PICC line.   We strive to give you quality time with your provider. You may need to reschedule your appointment if you arrive late (15 or more minutes).  Arriving late affects you and other patients whose appointments are after yours.  Also, if you miss three or more appointments without notifying the office, you may be dismissed from the clinic at the provider's discretion.      For prescription refill requests, have your pharmacy contact our office and allow 72 hours for refills to be completed.    Today you received the following chemotherapy and/or immunotherapy agents Kadcycla.     To help prevent nausea and vomiting after your treatment, we encourage you to take your nausea medication as directed.  BELOW ARE SYMPTOMS THAT SHOULD BE REPORTED IMMEDIATELY: *FEVER GREATER THAN 100.4 F (38 C) OR HIGHER *CHILLS OR SWEATING *NAUSEA AND VOMITING THAT IS NOT CONTROLLED WITH YOUR NAUSEA MEDICATION *UNUSUAL SHORTNESS OF BREATH *UNUSUAL BRUISING OR BLEEDING *URINARY PROBLEMS (pain or burning when urinating, or frequent urination) *BOWEL PROBLEMS (unusual diarrhea, constipation, pain near the anus) TENDERNESS IN MOUTH AND THROAT WITH OR WITHOUT PRESENCE OF ULCERS (sore throat, sores in mouth, or a toothache) UNUSUAL RASH, SWELLING OR PAIN  UNUSUAL VAGINAL DISCHARGE OR ITCHING   Items with * indicate a potential emergency and should be followed up as soon as possible or go to the Emergency Department if any problems should occur.  Please show the CHEMOTHERAPY ALERT CARD or IMMUNOTHERAPY  ALERT CARD at check-in to the Emergency Department and triage nurse. Should you have questions after your visit or need to cancel or reschedule your appointment, please contact [facility] - A DEPT OF [facility]  [phone] and follow the prompts.  Office hours are 8:00 a.m. to 4:30 p.m. Monday - Friday. Please note that voicemails left after 4:00 p.m. may not be returned until the following business day.  We are closed weekends and major holidays. You have access to a nurse at all times for urgent questions. Please call the main number to the clinic [phone] and follow the prompts.  For any non-urgent questions, you may also contact your provider using MyChart. We now offer e-Visits for anyone 39 and older to request care online for non-urgent symptoms. For details visit [email or web address].   Also download the MyChart app! Go to the app store, search MyChart, open the app, select [place], and log in with your MyChart username and password.

## 2024-02-16 NOTE — Progress Notes (Signed)
 Patient will proceed with cycle five today. She had repeat echo which showed improved EF.   Oncology Nurse Navigator Documentation     02/16/2024    9:30 AM  Oncology Nurse Navigator Flowsheets  Navigator Follow Up Date: 03/08/2024  Navigator Follow Up Reason: Follow-up Appointment;Chemotherapy  Navigator Location [facility]  Navigator Encounter Type Treatment  Patient Visit Type MedOnc  Treatment Phase Active Tx  Barriers/Navigation Needs No Barriers At This Time  Interventions None Required  Acuity Level 1-No Barriers  Support Groups/Services Friends and Family  Time Spent with Patient 15

## 2024-02-18 ENCOUNTER — Other Ambulatory Visit: Payer: Self-pay

## 2024-02-22 ENCOUNTER — Other Ambulatory Visit (HOSPITAL_BASED_OUTPATIENT_CLINIC_OR_DEPARTMENT_OTHER): Payer: Self-pay | Admitting: Obstetrics & Gynecology

## 2024-02-23 NOTE — Telephone Encounter (Signed)
 Medication refill request: Levothyroxine  50 mcg Last AEX:  12/26/2022 Next AEX: Not scheduled Refill pending Dr.Miller's review. Patient is currently under treatment for breast cancer, stage 1, estrogen receptor positive, right.  PCP: Olam Pinal, MD with [facility] Last TSH I can see: 01/04/2022

## 2024-02-24 NOTE — Progress Notes (Signed)
 Cardiology Office Note:  .   Date:  02/25/2024  ID:  Rhonda Paul, DOB 1950-12-21, MRN [id number] PCP: Cleotilde Planas, MD   HeartCare Providers Cardiologist:  Shelda Bruckner, MD {  History of Present Illness: Rhonda   [name] is a 73 y.o. female with Pmh nonobstructive CAD, embolic CVA, permanent afib, NICM with recovered EF, moderate MR. She was previously seen by Dr. Hobart and Dr. Claudene prior to that.  Pertinent CV history: Echo with EF 40-45% with severe MR with restricted posterior leaflet, mild RV dysfunction. TEE with moderate MR and EF 45-50%. Medically managed, limited options. Had hypotension with entresto .  Today: Finished general chemo at the end of may, then 4 more limited IV treatments, then it will be oral meds alone.   Has lost some weight with treatment, but thinks this is stabilizing. She tolerates food without issues but doesn't have much appetite. She is staying hydrated. Energy and stamina are slowly improving.  Echo on 02/12/24 with normal EF of 55-60%, GLS slightly abnormal at -15.4, couldn't be done on prior due to poor images, that EF was 50-55%. Starting EF was 60-65%, GLS -19. We reviewed this together today.  Edema has resolved. Wore compression stockings and elevated feet for about a month, swelling improved. More recently hasn't had any issues since being back on farxiga .  ROS: Denies chest pain, shortness of breath at rest or with normal exertion. No PND, orthopnea, LE edema or unexpected weight gain. No syncope or palpitations. ROS otherwise negative except as noted.   Studies Reviewed: Rhonda    EKG:       Physical Exam:   VS:  BP 108/62 (BP Location: Left Arm, Patient Position: Sitting, Cuff Size: Normal)   Pulse 87   Resp 17   Ht 5' 4 (1.626 m)   Wt 133 lb (60.3 kg)   LMP 11/08/2001 (Approximate)   SpO2 97%   BMI 22.83 kg/m    Wt Readings from Last 3 Encounters:  02/25/24 133 lb (60.3 kg)  02/16/24 135 lb 1.9 oz (61.3 kg)   01/27/24 132 lb (59.9 kg)    GEN: Well nourished, well developed in no acute distress HEENT: Normal, moist mucous membranes NECK: No JVD CARDIAC: irregularly irregular rhythm, normal S1 and S2, no rubs or gallops. 1/6 HS murmur. VASCULAR: Radial and DP pulses 2+ bilaterally. No carotid bruits RESPIRATORY:  Clear to auscultation without rales, wheezing or rhonchi  ABDOMEN: Soft, non-tender, non-distended MUSCULOSKELETAL:  Ambulates independently SKIN: Warm and dry, no edema NEUROLOGIC:  Alert and oriented x 3. No focal neuro deficits noted. PSYCHIATRIC:  Normal affect    ASSESSMENT AND PLAN: .    Nonischemic cardiomyopathy Heart failure with recovered EF Moderate mitral regurgitation S/P chemotherapy for breast cancer -NYHA class I -lowest EF 40-45% with severe MR in 04/2022, recovered to 50-55% on recheck 09/2022 -now with blood pressure improved, she is tolerating farxiga  -tolerating metoprolol  succinate 50 mg daily -no blood pressure room for additional GDMT. EF has normalized.  -We did review her echoes together in the setting of her chemotherapy; most recent GLS just barely abnormal, but she is asymptomatic.  Permanent atrial fibrillation -CHA2DS2/VAS Stroke Risk Points=7  -on apixaban  -on metoprolol  for rate control  Nonobstructive CAD Mixed hyperlipidemia -cardiac PET without ischemia. EF 58% -continue atorvastatin , ezetimibe . Lipids at goal 03/2023. If doing well, would check lipids at follow up in 6 mos -no aspirin  as she is on apixaban   CV risk counseling and prevention -recommend  heart healthy/Mediterranean diet, with whole grains, fruits, vegetable, fish, lean meats, nuts, and olive oil. Limit salt. -recommend moderate walking, 3-5 times/week for 30-50 minutes each session. Aim for at least 150 minutes.week. Goal should be pace of 3 miles/hours, or walking 1.5 miles in 30 minutes -recommend avoidance of tobacco products. Avoid excess alcohol.  Dispo: 6  months  Signed, Shelda Bruckner, MD   Shelda Bruckner, MD, PhD, [facility]  [facility]    Heart & Vascular at [facility] [address], [address] [place], [place] [address] [phone]

## 2024-02-25 ENCOUNTER — Ambulatory Visit (INDEPENDENT_AMBULATORY_CARE_PROVIDER_SITE_OTHER): Admitting: Cardiology

## 2024-02-25 ENCOUNTER — Encounter (HOSPITAL_BASED_OUTPATIENT_CLINIC_OR_DEPARTMENT_OTHER): Payer: Self-pay | Admitting: Cardiology

## 2024-02-25 VITALS — BP 108/62 | HR 87 | Resp 17 | Ht 64.0 in | Wt 133.0 lb

## 2024-02-25 DIAGNOSIS — I5032 Chronic diastolic (congestive) heart failure: Secondary | ICD-10-CM

## 2024-02-25 DIAGNOSIS — Z712 Person consulting for explanation of examination or test findings: Secondary | ICD-10-CM | POA: Diagnosis not present

## 2024-02-25 DIAGNOSIS — I428 Other cardiomyopathies: Secondary | ICD-10-CM

## 2024-02-25 DIAGNOSIS — I4821 Permanent atrial fibrillation: Secondary | ICD-10-CM

## 2024-02-25 DIAGNOSIS — D6859 Other primary thrombophilia: Secondary | ICD-10-CM

## 2024-02-25 DIAGNOSIS — I2510 Atherosclerotic heart disease of native coronary artery without angina pectoris: Secondary | ICD-10-CM | POA: Diagnosis not present

## 2024-02-25 NOTE — Patient Instructions (Signed)
 Medication Instructions:  Your physician recommends that you continue on your current medications as directed. Please refer to the Current Medication list given to you today.  *If you need a refill on your cardiac medications before your next appointment, please call your pharmacy*  Lab Work: NONE  Testing/Procedures: NONE  Follow-Up: At [facility], you and your health needs are our priority.  As part of our continuing mission to provide you with exceptional heart care, we have created designated Provider Care Teams.  These Care Teams include your primary Cardiologist (physician) and Advanced Practice Providers (APPs -  Physician Assistants and Nurse Practitioners) who all work together to provide you with the care you need, when you need it.  We recommend signing up for the patient portal called MyChart.  Sign up information is provided on this After Visit Summary.  MyChart is used to connect with patients for Virtual Visits (Telemedicine).  Patients are able to view lab/test results, encounter notes, upcoming appointments, etc.  Non-urgent messages can be sent to your provider as well.   To learn more about what you can do with MyChart, go to [email or web address].    Your next appointment:   6 month(s)  The format for your next appointment:   In Person  Provider:   Shelda Bruckner, MD Reche ORN NP or Rosaline RAMAN NP

## 2024-02-26 ENCOUNTER — Other Ambulatory Visit: Payer: Self-pay

## 2024-03-02 DIAGNOSIS — I5022 Chronic systolic (congestive) heart failure: Secondary | ICD-10-CM | POA: Diagnosis not present

## 2024-03-02 DIAGNOSIS — G4733 Obstructive sleep apnea (adult) (pediatric): Secondary | ICD-10-CM | POA: Diagnosis not present

## 2024-03-02 DIAGNOSIS — I480 Paroxysmal atrial fibrillation: Secondary | ICD-10-CM | POA: Diagnosis not present

## 2024-03-03 ENCOUNTER — Other Ambulatory Visit: Payer: Self-pay | Admitting: Cardiology

## 2024-03-07 ENCOUNTER — Other Ambulatory Visit: Payer: Self-pay

## 2024-03-08 ENCOUNTER — Inpatient Hospital Stay

## 2024-03-08 ENCOUNTER — Inpatient Hospital Stay: Admitting: Hematology & Oncology

## 2024-03-08 ENCOUNTER — Other Ambulatory Visit: Payer: Self-pay

## 2024-03-08 ENCOUNTER — Encounter: Payer: Self-pay | Admitting: Hematology & Oncology

## 2024-03-08 VITALS — BP 109/72 | HR 68 | Temp 98.3°F | Resp 18 | Ht 64.0 in | Wt 134.0 lb

## 2024-03-08 VITALS — BP 103/60 | HR 70

## 2024-03-08 DIAGNOSIS — C50011 Malignant neoplasm of nipple and areola, right female breast: Secondary | ICD-10-CM

## 2024-03-08 DIAGNOSIS — Z5112 Encounter for antineoplastic immunotherapy: Secondary | ICD-10-CM | POA: Diagnosis not present

## 2024-03-08 DIAGNOSIS — C50911 Malignant neoplasm of unspecified site of right female breast: Secondary | ICD-10-CM

## 2024-03-08 DIAGNOSIS — Z170 Estrogen receptor positive status [ER+]: Secondary | ICD-10-CM

## 2024-03-08 LAB — CBC WITH DIFFERENTIAL (CANCER CENTER ONLY)
Abs Immature Granulocytes: 0.01 K/uL (ref 0.00–0.07)
Basophils Absolute: 0 K/uL (ref 0.0–0.1)
Basophils Relative: 1 %
Eosinophils Absolute: 0.1 K/uL (ref 0.0–0.5)
Eosinophils Relative: 2 %
HCT: 35 % — ABNORMAL LOW (ref 36.0–46.0)
Hemoglobin: 11.7 g/dL — ABNORMAL LOW (ref 12.0–15.0)
Immature Granulocytes: 0 %
Lymphocytes Relative: 42 %
Lymphs Abs: 1.9 K/uL (ref 0.7–4.0)
MCH: 34.7 pg — ABNORMAL HIGH (ref 26.0–34.0)
MCHC: 33.4 g/dL (ref 30.0–36.0)
MCV: 103.9 fL — ABNORMAL HIGH (ref 80.0–100.0)
Monocytes Absolute: 0.6 K/uL (ref 0.1–1.0)
Monocytes Relative: 13 %
Neutro Abs: 1.8 K/uL (ref 1.7–7.7)
Neutrophils Relative %: 42 %
Platelet Count: 89 K/uL — ABNORMAL LOW (ref 150–400)
RBC: 3.37 MIL/uL — ABNORMAL LOW (ref 3.87–5.11)
RDW: 15.1 % (ref 11.5–15.5)
Smear Review: NORMAL
WBC Count: 4.4 K/uL (ref 4.0–10.5)
nRBC: 0 % (ref 0.0–0.2)

## 2024-03-08 LAB — LACTATE DEHYDROGENASE: LDH: 222 U/L — ABNORMAL HIGH (ref 98–192)

## 2024-03-08 LAB — CMP (CANCER CENTER ONLY)
ALT: 25 U/L (ref 0–44)
AST: 37 U/L (ref 15–41)
Albumin: 4 g/dL (ref 3.5–5.0)
Alkaline Phosphatase: 73 U/L (ref 38–126)
Anion gap: 12 (ref 5–15)
BUN: 8 mg/dL (ref 8–23)
CO2: 26 mmol/L (ref 22–32)
Calcium: 9.2 mg/dL (ref 8.9–10.3)
Chloride: 102 mmol/L (ref 98–111)
Creatinine: 0.71 mg/dL (ref 0.44–1.00)
GFR, Estimated: >60 mL/min (ref >60)
Glucose, Bld: 113 mg/dL — ABNORMAL HIGH (ref 70–99)
Potassium: 3.7 mmol/L (ref 3.5–5.1)
Sodium: 140 mmol/L (ref 135–145)
Total Bilirubin: 1.2 mg/dL (ref 0.0–1.2)
Total Protein: 6.9 g/dL (ref 6.5–8.1)

## 2024-03-08 MED ORDER — ACETAMINOPHEN 325 MG PO TABS
650.0000 mg | ORAL_TABLET | Freq: Once | ORAL | Status: AC
  Administered 2024-03-08: 650 mg via ORAL
  Filled 2024-03-08: qty 2

## 2024-03-08 MED ORDER — SODIUM CHLORIDE 0.9 % IV SOLN: INTRAVENOUS | Status: DC

## 2024-03-08 MED ORDER — DIPHENHYDRAMINE HCL 25 MG PO CAPS
50.0000 mg | ORAL_CAPSULE | Freq: Once | ORAL | Status: AC
  Administered 2024-03-08: 50 mg via ORAL
  Filled 2024-03-08: qty 2

## 2024-03-08 MED ORDER — DENOSUMAB 60 MG/ML ~~LOC~~ SOSY
60.0000 mg | PREFILLED_SYRINGE | Freq: Once | SUBCUTANEOUS | Status: AC
  Administered 2024-03-08: 60 mg via SUBCUTANEOUS
  Filled 2024-03-08: qty 1

## 2024-03-08 MED ORDER — SODIUM CHLORIDE 0.9 % IV SOLN
3.6000 mg/kg | Freq: Once | INTRAVENOUS | Status: AC
  Administered 2024-03-08: 220 mg via INTRAVENOUS
  Filled 2024-03-08: qty 8

## 2024-03-08 MED ORDER — PROCHLORPERAZINE MALEATE 10 MG PO TABS
10.0000 mg | ORAL_TABLET | Freq: Once | ORAL | Status: AC
  Administered 2024-03-08: 10 mg via ORAL
  Filled 2024-03-08: qty 1

## 2024-03-08 NOTE — Patient Instructions (Signed)
 [facility] - A DEPT OF [place]  Discharge Instructions: Thank you for choosing [facility] to provide your oncology and hematology care.   If you have a lab appointment with the Cancer Center, please go directly to the Cancer Center and check in at the registration area.  Wear comfortable clothing and clothing appropriate for easy access to any Portacath or PICC line.   We strive to give you quality time with your provider. You may need to reschedule your appointment if you arrive late (15 or more minutes).  Arriving late affects you and other patients whose appointments are after yours.  Also, if you miss three or more appointments without notifying the office, you may be dismissed from the clinic at the provider's discretion.      For prescription refill requests, have your pharmacy contact our office and allow 72 hours for refills to be completed.    Today you received the following chemotherapy and/or immunotherapy agents Kadcyla /Prolia      To help prevent nausea and vomiting after your treatment, we encourage you to take your nausea medication as directed.  BELOW ARE SYMPTOMS THAT SHOULD BE REPORTED IMMEDIATELY: *FEVER GREATER THAN 100.4 F (38 C) OR HIGHER *CHILLS OR SWEATING *NAUSEA AND VOMITING THAT IS NOT CONTROLLED WITH YOUR NAUSEA MEDICATION *UNUSUAL SHORTNESS OF BREATH *UNUSUAL BRUISING OR BLEEDING *URINARY PROBLEMS (pain or burning when urinating, or frequent urination) *BOWEL PROBLEMS (unusual diarrhea, constipation, pain near the anus) TENDERNESS IN MOUTH AND THROAT WITH OR WITHOUT PRESENCE OF ULCERS (sore throat, sores in mouth, or a toothache) UNUSUAL RASH, SWELLING OR PAIN  UNUSUAL VAGINAL DISCHARGE OR ITCHING   Items with * indicate a potential emergency and should be followed up as soon as possible or go to the Emergency Department if any problems should occur.  Please show the CHEMOTHERAPY ALERT CARD or  IMMUNOTHERAPY ALERT CARD at check-in to the Emergency Department and triage nurse. Should you have questions after your visit or need to cancel or reschedule your appointment, please contact [facility] - A DEPT OF [facility]  [phone] and follow the prompts.  Office hours are 8:00 a.m. to 4:30 p.m. Monday - Friday. Please note that voicemails left after 4:00 p.m. may not be returned until the following business day.  We are closed weekends and major holidays. You have access to a nurse at all times for urgent questions. Please call the main number to the clinic [phone] and follow the prompts.  For any non-urgent questions, you may also contact your provider using MyChart. We now offer e-Visits for anyone 50 and older to request care online for non-urgent symptoms. For details visit [email or web address].   Also download the MyChart app! Go to the app store, search MyChart, open the app, select , and log in with your MyChart username and password.

## 2024-03-08 NOTE — Progress Notes (Signed)
 Hematology and Oncology Follow Up Visit  Rhonda Paul [id number] [birth year] 73 y.o. 03/08/2024   Principle Diagnosis:  Stage IA (T1cN0M0) invasive ductal carcinoma of the right breast -- ER+/PR-/HER2+ Remote history of stage I-triple negative-cancer of the right breast-2004  Previous Therapy: Status post right mastectomy --07/04/2023 Adjuvant Carbo/Taxotere /Herceptin / --s/p cycle #4/4 -- start on 08/11/2023  Current Therapy:   Kadcyla  3.6 mg/m2 IV q 3 weeks -- s/p cycle #5 - start on 11/24/2023 Femara  2.5 mg po q day -- start on 11/18/2023 Prolia 60 mg SQ every 6 months -next dose 08/2024     Interim History:  Rhonda Paul is back for follow-up.  She had a very busy weekend.  She was doing quite a bit of yard work.  She had no problems doing this.  She is eating well.  She has had no problems with cough or shortness of breath.  She has had no nausea or vomiting.  She has had no change in bowel or bladder habits.  She is doing well on the Femara .  Her platelet count has been down a little bit.  I have to believe that this is probably secondary to the Kadcyla .  She has had no bleeding.  There has been no bruising.  She has had no neuropathy.  Overall, I would have to say that her performance status is probably ECOG 1.       Wt Readings from Last 3 Encounters:  03/08/24 134 lb (60.8 kg)  02/25/24 133 lb (60.3 kg)  02/16/24 135 lb 1.9 oz (61.3 kg)     Medications:  Current Outpatient Medications:    apixaban  (ELIQUIS ) 5 MG TABS tablet, Take 1 tablet (5 mg total) by mouth 2 (two) times daily., Disp: 60 tablet, Rfl: 5   atorvastatin  (LIPITOR ) 40 MG tablet, TAKE ONE TABLET BY MOUTH DAILY, Disp: 90 tablet, Rfl: 3   Biotin w/ Vitamins C & E (HAIR/SKIN/NAILS PO), Take 1 capsule by mouth daily., Disp: , Rfl:    Cholecalciferol 50 MCG (2000 UT) CAPS, Take 1 capsule by mouth daily., Disp: , Rfl:    dapagliflozin  propanediol (FARXIGA ) 10 MG TABS tablet, TAKE 1 TABLET BY MOUTH DAILY  BEFORE BREAKFAST, Disp: 90 tablet, Rfl: 3   ezetimibe  (ZETIA ) 10 MG tablet, Take 1 tablet (10 mg total) by mouth daily., Disp: 90 tablet, Rfl: 3   ipratropium (ATROVENT ) 0.03 % nasal spray, Place 1 spray into both nostrils daily., Disp: , Rfl:    letrozole  (FEMARA ) 2.5 MG tablet, Take 1 tablet (2.5 mg total) by mouth daily., Disp: 30 tablet, Rfl: 12   levothyroxine  (SYNTHROID ) 50 MCG tablet, TAKE 1 TABLET BY MOUTH DAILY, Disp: 90 tablet, Rfl: 0   lidocaine -prilocaine  (EMLA ) cream, Apply 1 Application topically as needed. Apply one hour prior to port access and cover with plastic wrap, Disp: 30 g, Rfl: 0   metoprolol  succinate (TOPROL -XL) 50 MG 24 hr tablet, Take 1 tablet (50 mg total) by mouth daily. Take with or immediately following a meal., Disp: 90 tablet, Rfl: 3   nitroGLYCERIN  (NITROSTAT ) 0.4 MG SL tablet, Place 1 tablet (0.4 mg total) under the tongue every 5 (five) minutes as needed for chest pain., Disp: 25 tablet, Rfl: 3   NON FORMULARY, Pt uses cpap nightly, Disp: , Rfl:   Allergies:  Allergies  Allergen Reactions   Lambs Quarters Hives, Itching and Nausea And Vomiting   Entresto  [Sacubitril -Valsartan ] Other (See Comments)    Low BP   Lanolin Acid [Lanolin] Rash  Other Itching and Rash    wool    Past Medical History, Surgical history, Social history, and Family History were reviewed and updated.  Review of Systems: Review of Systems  Constitutional: Negative.   HENT:  Negative.    Eyes: Negative.   Respiratory: Negative.    Cardiovascular: Negative.   Gastrointestinal: Negative.   Endocrine: Negative.   Genitourinary: Negative.  Negative for difficulty urinating.   Musculoskeletal: Negative.   Skin: Negative.   Neurological: Negative.   Hematological: Negative.   Psychiatric/Behavioral: Negative.      Physical Exam:  height is 5' 4 (1.626 m) and weight is 134 lb (60.8 kg). Her oral temperature is 98.3 F (36.8 C). Her blood pressure is 109/72 and her pulse is  68. Her respiration is 18 and oxygen saturation is 100%.   Wt Readings from Last 3 Encounters:  03/08/24 134 lb (60.8 kg)  02/25/24 133 lb (60.3 kg)  02/16/24 135 lb 1.9 oz (61.3 kg)    Physical Exam Vitals reviewed.  HENT:     Head: Normocephalic and atraumatic.  Eyes:     Pupils: Pupils are equal, round, and reactive to light.  Cardiovascular:     Rate and Rhythm: Normal rate and regular rhythm.     Heart sounds: Normal heart sounds.  Pulmonary:     Effort: Pulmonary effort is normal.     Breath sounds: Normal breath sounds.  Abdominal:     General: Bowel sounds are normal.     Palpations: Abdomen is soft.  Musculoskeletal:        General: No tenderness or deformity. Normal range of motion.     Cervical back: Normal range of motion.  Lymphadenopathy:     Cervical: No cervical adenopathy.  Skin:    General: Skin is warm and dry.     Findings: No erythema or rash.  Neurological:     Mental Status: She is alert and oriented to person, place, and time.  Psychiatric:        Behavior: Behavior normal.        Thought Content: Thought content normal.        Judgment: Judgment normal.      Lab Results  Component Value Date   WBC 4.4 03/08/2024   HGB 11.7 (L) 03/08/2024   HCT 35.0 (L) 03/08/2024   MCV 103.9 (H) 03/08/2024   PLT 89 (L) 03/08/2024     Chemistry      Component Value Date/Time   NA 140 03/08/2024 0846   NA 141 03/24/2023 0832   NA 142 01/30/2017 0847   NA 140 02/01/2016 0841   K 3.7 03/08/2024 0846   K 4.9 (H) 01/30/2017 0847   K 4.8 02/01/2016 0841   CL 102 03/08/2024 0846   CL 104 01/30/2017 0847   CO2 26 03/08/2024 0846   CO2 32 01/30/2017 0847   CO2 27 02/01/2016 0841   BUN 8 03/08/2024 0846   BUN 9 03/24/2023 0832   BUN 11 01/30/2017 0847   BUN 12.7 02/01/2016 0841   CREATININE 0.71 03/08/2024 0846   CREATININE 1.0 01/30/2017 0847   CREATININE 0.8 02/01/2016 0841      Component Value Date/Time   CALCIUM  9.2 03/08/2024 0846   CALCIUM   9.6 01/30/2017 0847   CALCIUM  10.0 02/01/2016 0841   ALKPHOS 73 03/08/2024 0846   ALKPHOS 51 01/30/2017 0847   ALKPHOS 59 02/01/2016 0841   AST 37 03/08/2024 0846   AST 22 02/01/2016 0841   ALT  25 03/08/2024 0846   ALT 25 01/30/2017 0847   ALT 25 02/01/2016 0841   BILITOT 1.2 03/08/2024 0846   BILITOT 0.93 02/01/2016 0841      Impression and Plan: Ms. Milbourn is a very nice 73 year old white female.  She had a remote history of early-stage breast cancer of the right breast.  She had adjuvant chemotherapy for that.  She subsequently was found to have a new breast cancer in the right breast.  This cancer is HER2 positive./ER positive.  She underwent mastectomy.  We gave her adjuvant chemotherapy.  She completed this in May 2025.  She now is on Kadcyla .  She is doing well on the Kadcyla .  We will have to watch her platelet count.  I do think it is okay for treatment.  She also continues on the Femara .  Will go ahead and do Prolia.  She is in agreement with this.  I do think this would help her out.  I will plan to have her come back to see us  in another 3 weeks.    Maude JONELLE Crease, MD 9/29/20259:37 AM

## 2024-03-08 NOTE — Progress Notes (Signed)
 Saw MD today. Plts 89 today. OK to treat per MD.

## 2024-03-09 ENCOUNTER — Encounter: Payer: Self-pay | Admitting: *Deleted

## 2024-03-09 NOTE — Progress Notes (Signed)
 Patient continues to do well with treatment. She received cycle six.   Oncology Nurse Navigator Documentation     03/09/2024    9:15 AM  Oncology Nurse Navigator Flowsheets  Navigator Follow Up Date: 03/29/2024  Navigator Follow Up Reason: Follow-up Appointment;Chemotherapy  Navigator Location [facility]  Navigator Encounter Type Appt/Treatment Plan Review  Patient Visit Type MedOnc  Treatment Phase Active Tx  Barriers/Navigation Needs No Barriers At This Time  Interventions None Required  Acuity Level 1-No Barriers  Support Groups/Services Friends and Family  Time Spent with Patient 15

## 2024-03-10 ENCOUNTER — Encounter: Payer: Self-pay | Admitting: Hematology & Oncology

## 2024-03-12 ENCOUNTER — Other Ambulatory Visit: Payer: Self-pay

## 2024-03-14 ENCOUNTER — Other Ambulatory Visit: Payer: Self-pay

## 2024-03-15 ENCOUNTER — Ambulatory Visit: Admitting: Physical Therapy

## 2024-03-24 ENCOUNTER — Other Ambulatory Visit: Payer: Self-pay

## 2024-03-25 ENCOUNTER — Other Ambulatory Visit: Payer: Self-pay

## 2024-03-29 ENCOUNTER — Inpatient Hospital Stay

## 2024-03-29 ENCOUNTER — Other Ambulatory Visit: Payer: Self-pay

## 2024-03-29 ENCOUNTER — Inpatient Hospital Stay: Attending: Hematology & Oncology

## 2024-03-29 ENCOUNTER — Encounter: Payer: Self-pay | Admitting: Medical Oncology

## 2024-03-29 ENCOUNTER — Inpatient Hospital Stay: Admitting: Medical Oncology

## 2024-03-29 VITALS — BP 112/79 | HR 75 | Temp 97.6°F | Resp 20 | Ht 64.5 in | Wt 134.0 lb

## 2024-03-29 DIAGNOSIS — D6959 Other secondary thrombocytopenia: Secondary | ICD-10-CM | POA: Insufficient documentation

## 2024-03-29 DIAGNOSIS — C50011 Malignant neoplasm of nipple and areola, right female breast: Secondary | ICD-10-CM | POA: Diagnosis not present

## 2024-03-29 DIAGNOSIS — Z7901 Long term (current) use of anticoagulants: Secondary | ICD-10-CM

## 2024-03-29 DIAGNOSIS — E538 Deficiency of other specified B group vitamins: Secondary | ICD-10-CM | POA: Insufficient documentation

## 2024-03-29 DIAGNOSIS — D696 Thrombocytopenia, unspecified: Secondary | ICD-10-CM

## 2024-03-29 DIAGNOSIS — C50911 Malignant neoplasm of unspecified site of right female breast: Secondary | ICD-10-CM | POA: Insufficient documentation

## 2024-03-29 DIAGNOSIS — Z5112 Encounter for antineoplastic immunotherapy: Secondary | ICD-10-CM | POA: Diagnosis present

## 2024-03-29 DIAGNOSIS — Z1731 Human epidermal growth factor receptor 2 positive status: Secondary | ICD-10-CM | POA: Insufficient documentation

## 2024-03-29 DIAGNOSIS — Z170 Estrogen receptor positive status [ER+]: Secondary | ICD-10-CM

## 2024-03-29 DIAGNOSIS — T451X5A Adverse effect of antineoplastic and immunosuppressive drugs, initial encounter: Secondary | ICD-10-CM | POA: Diagnosis not present

## 2024-03-29 LAB — CBC WITH DIFFERENTIAL (CANCER CENTER ONLY)
Abs Immature Granulocytes: 0.01 K/uL (ref 0.00–0.07)
Basophils Absolute: 0 K/uL (ref 0.0–0.1)
Basophils Relative: 1 %
Eosinophils Absolute: 0.1 K/uL (ref 0.0–0.5)
Eosinophils Relative: 2 %
HCT: 36.1 % (ref 36.0–46.0)
Hemoglobin: 12.5 g/dL (ref 12.0–15.0)
Immature Granulocytes: 0 %
Lymphocytes Relative: 44 %
Lymphs Abs: 1.7 K/uL (ref 0.7–4.0)
MCH: 37.1 pg — ABNORMAL HIGH (ref 26.0–34.0)
MCHC: 34.6 g/dL (ref 30.0–36.0)
MCV: 107.1 fL — ABNORMAL HIGH (ref 80.0–100.0)
Monocytes Absolute: 0.5 K/uL (ref 0.1–1.0)
Monocytes Relative: 12 %
Neutro Abs: 1.6 K/uL — ABNORMAL LOW (ref 1.7–7.7)
Neutrophils Relative %: 41 %
Platelet Count: 68 K/uL — ABNORMAL LOW (ref 150–400)
RBC: 3.37 MIL/uL — ABNORMAL LOW (ref 3.87–5.11)
RDW: 16 % — ABNORMAL HIGH (ref 11.5–15.5)
WBC Count: 4 K/uL (ref 4.0–10.5)
nRBC: 0 % (ref 0.0–0.2)

## 2024-03-29 LAB — CMP (CANCER CENTER ONLY)
ALT: 28 U/L (ref 0–44)
AST: 47 U/L — ABNORMAL HIGH (ref 15–41)
Albumin: 4 g/dL (ref 3.5–5.0)
Alkaline Phosphatase: 102 U/L (ref 38–126)
Anion gap: 13 (ref 5–15)
BUN: 7 mg/dL — ABNORMAL LOW (ref 8–23)
CO2: 24 mmol/L (ref 22–32)
Calcium: 9.1 mg/dL (ref 8.9–10.3)
Chloride: 102 mmol/L (ref 98–111)
Creatinine: 0.66 mg/dL (ref 0.44–1.00)
GFR, Estimated: >60 mL/min (ref >60)
Glucose, Bld: 94 mg/dL (ref 70–99)
Potassium: 3.6 mmol/L (ref 3.5–5.1)
Sodium: 138 mmol/L (ref 135–145)
Total Bilirubin: 1.3 mg/dL — ABNORMAL HIGH (ref 0.0–1.2)
Total Protein: 7.1 g/dL (ref 6.5–8.1)

## 2024-03-29 LAB — HIV ANTIBODY (ROUTINE TESTING W REFLEX): HIV Screen 4th Generation wRfx: NONREACTIVE

## 2024-03-29 LAB — LACTATE DEHYDROGENASE: LDH: 244 U/L — ABNORMAL HIGH (ref 98–192)

## 2024-03-29 LAB — FOLATE: Folate: 14 ng/mL (ref >5.9)

## 2024-03-29 LAB — VITAMIN B12: Vitamin B-12: 188 pg/mL (ref 180–914)

## 2024-03-29 LAB — HEPATITIS B SURFACE ANTIGEN: Hepatitis B Surface Ag: NONREACTIVE

## 2024-03-29 LAB — HEPATITIS B SURFACE ANTIBODY,QUALITATIVE: Hep B S Ab: NONREACTIVE

## 2024-03-29 NOTE — Progress Notes (Signed)
 Hematology and Oncology Follow Up Visit  GENEIEVE DUELL [id number] 1950-12-14 73 y.o. 03/29/2024   Principle Diagnosis:  Stage IA ([id number]) invasive ductal carcinoma of the right breast -- ER+/PR-/HER2+ Remote history of stage I-triple negative-cancer of the right breast-2004  Previous Therapy: Status post right mastectomy --07/04/2023 Adjuvant Carbo/Taxotere /Herceptin / --s/p cycle #4/4 -- start on 08/11/2023  Current Therapy:   Kadcyla  3.6 mg/m2 IV q 3 weeks -- s/p cycle #6 - start on 11/24/2023 Femara  2.5 mg po q day -- start on 11/18/2023 Prolia 60 mg SQ every 6 months -next dose 08/2024     Interim History:  Ms. Morand is back for follow-up and consideration for treatment.   Today she states that she has been doing well.   She is eating well.  She has had no problems with cough or shortness of breath.  She has had no nausea or vomiting.  She has had no change in bowel or bladder habits.  She is doing well on the Femara . No reported side effects  Her platelet count has been down a little bit which is suspected to be secondary to the Kadcyla .  She has had no bleeding but has noticed more bruising than normal on her arms.   She has had no neuropathy.  Overall, I would have to say that her performance status is probably ECOG 1.       Wt Readings from Last 3 Encounters:  03/29/24 134 lb (60.8 kg)  03/08/24 134 lb (60.8 kg)  02/25/24 133 lb (60.3 kg)     Medications:  Current Outpatient Medications:    apixaban  (ELIQUIS ) 5 MG TABS tablet, Take 1 tablet (5 mg total) by mouth 2 (two) times daily., Disp: 60 tablet, Rfl: 5   atorvastatin  (LIPITOR ) 40 MG tablet, TAKE ONE TABLET BY MOUTH DAILY, Disp: 90 tablet, Rfl: 3   Biotin w/ Vitamins C & E (HAIR/SKIN/NAILS PO), Take 1 capsule by mouth daily., Disp: , Rfl:    Cholecalciferol 50 MCG (2000 UT) CAPS, Take 1 capsule by mouth daily., Disp: , Rfl:    dapagliflozin  propanediol (FARXIGA ) 10 MG TABS tablet, TAKE 1 TABLET BY MOUTH  DAILY BEFORE BREAKFAST, Disp: 90 tablet, Rfl: 3   ezetimibe  (ZETIA ) 10 MG tablet, Take 1 tablet (10 mg total) by mouth daily., Disp: 90 tablet, Rfl: 3   ipratropium (ATROVENT ) 0.03 % nasal spray, Place 1 spray into both nostrils daily., Disp: , Rfl:    letrozole  (FEMARA ) 2.5 MG tablet, Take 1 tablet (2.5 mg total) by mouth daily., Disp: 30 tablet, Rfl: 12   levothyroxine  (SYNTHROID ) 50 MCG tablet, TAKE 1 TABLET BY MOUTH DAILY, Disp: 90 tablet, Rfl: 0   lidocaine -prilocaine  (EMLA ) cream, Apply 1 Application topically as needed. Apply one hour prior to port access and cover with plastic wrap, Disp: 30 g, Rfl: 0   metoprolol  succinate (TOPROL -XL) 50 MG 24 hr tablet, Take 1 tablet (50 mg total) by mouth daily. Take with or immediately following a meal., Disp: 90 tablet, Rfl: 3   nitroGLYCERIN  (NITROSTAT ) 0.4 MG SL tablet, Place 1 tablet (0.4 mg total) under the tongue every 5 (five) minutes as needed for chest pain., Disp: 25 tablet, Rfl: 3   NON FORMULARY, Pt uses cpap nightly, Disp: , Rfl:   Allergies:  Allergies  Allergen Reactions   [place] Hives, Itching and Nausea And Vomiting   Entresto  [Sacubitril -Valsartan ] Other (See Comments)    Low BP   Lanolin Acid [Lanolin] Rash   Other Itching and Rash  wool    Past Medical History, Surgical history, Social history, and Family History were reviewed and updated.  Review of Systems: Review of Systems  Constitutional: Negative.   HENT:  Negative.    Eyes: Negative.   Respiratory: Negative.    Cardiovascular: Negative.   Gastrointestinal: Negative.   Endocrine: Negative.   Genitourinary: Negative.  Negative for difficulty urinating.   Musculoskeletal: Negative.   Skin: Negative.   Neurological: Negative.   Hematological: Negative.   Psychiatric/Behavioral: Negative.      Physical Exam:  height is 5' 4.5 (1.638 m) and weight is 134 lb (60.8 kg). Her oral temperature is 97.6 F (36.4 C). Her blood pressure is 112/79 and her  pulse is 75. Her respiration is 20 and oxygen saturation is 100%.   Wt Readings from Last 3 Encounters:  03/29/24 134 lb (60.8 kg)  03/08/24 134 lb (60.8 kg)  02/25/24 133 lb (60.3 kg)    Physical Exam Vitals reviewed.  HENT:     Head: Normocephalic and atraumatic.  Eyes:     Pupils: Pupils are equal, round, and reactive to light.  Cardiovascular:     Rate and Rhythm: Normal rate and regular rhythm.     Heart sounds: Normal heart sounds.  Pulmonary:     Effort: Pulmonary effort is normal.     Breath sounds: Normal breath sounds.  Abdominal:     General: Bowel sounds are normal.     Palpations: Abdomen is soft.  Musculoskeletal:        General: No tenderness or deformity. Normal range of motion.     Cervical back: Normal range of motion.  Lymphadenopathy:     Cervical: No cervical adenopathy.  Skin:    General: Skin is warm and dry.     Findings: Bruising (Two 2 inch bruises of right ventral arm) present. No erythema or rash.  Neurological:     Mental Status: She is alert and oriented to person, place, and time.  Psychiatric:        Behavior: Behavior normal.        Thought Content: Thought content normal.        Judgment: Judgment normal.      Lab Results  Component Value Date   WBC 4.0 03/29/2024   HGB 12.5 03/29/2024   HCT 36.1 03/29/2024   MCV 107.1 (H) 03/29/2024   PLT 68 (L) 03/29/2024     Chemistry      Component Value Date/Time   NA 140 03/08/2024 0846   NA 141 03/24/2023 0832   NA 142 01/30/2017 0847   NA 140 02/01/2016 0841   K 3.7 03/08/2024 0846   K 4.9 (H) 01/30/2017 0847   K 4.8 02/01/2016 0841   CL 102 03/08/2024 0846   CL 104 01/30/2017 0847   CO2 26 03/08/2024 0846   CO2 32 01/30/2017 0847   CO2 27 02/01/2016 0841   BUN 8 03/08/2024 0846   BUN 9 03/24/2023 0832   BUN 11 01/30/2017 0847   BUN 12.7 02/01/2016 0841   CREATININE 0.71 03/08/2024 0846   CREATININE 1.0 01/30/2017 0847   CREATININE 0.8 02/01/2016 0841      Component  Value Date/Time   CALCIUM  9.2 03/08/2024 0846   CALCIUM  9.6 01/30/2017 0847   CALCIUM  10.0 02/01/2016 0841   ALKPHOS 73 03/08/2024 0846   ALKPHOS 51 01/30/2017 0847   ALKPHOS 59 02/01/2016 0841   AST 37 03/08/2024 0846   AST 22 02/01/2016 0841   ALT 25  03/08/2024 0846   ALT 25 01/30/2017 0847   ALT 25 02/01/2016 0841   BILITOT 1.2 03/08/2024 0846   BILITOT 0.93 02/01/2016 0841     Encounter Diagnosis  Name Primary?   Thrombocytopenia Yes    Impression and Plan: Ms. Curl is a very nice 73 year old white female.  She had a remote history of early-stage breast cancer of the right breast.  She had adjuvant chemotherapy for that.  She subsequently was found to have a new breast cancer in the right breast.  This cancer is HER2 positive./ER positive.  She underwent mastectomy.  We gave her adjuvant chemotherapy.  She completed this in May 2025.  She now is on Kadcyla .  She is doing well on the Kadcyla  other than thrombocytopenia.   Platelets continue to drop. MCV has trended upwards as well. I suspect there may be a B12 deficiency as well. Per package insert: In the event of decreased platelet count to Grade >= 3 (<50,000/mm3), do not administer KADCYLA  until platelet counts recover to Grade 1 (>=75,000/mm3). Closely monitor patients with thrombocytopenia (< 100,000/mm3) and patients on anti-coagulant treatment during treatment with KADCYLA .  She also continues on the [facility]   I reviewed potential etiologies for thrombocytopenia including liver disease, splenomegaly, infectious processes, nutritional anemias, immune mediated and bone marrow disorders. Patient is not taking any medications or recent infections. Patient will proceed with labs today to check CBC, CMP, Vitamin B12, MMA, folate, LDH, Hepatitis B and C serologies, HIV serology, platelet by citrate and save smear.  #Thrombocytopenia, suspected to be secondary to medication: --Labs today to check CBC w/diff, CMP, B12 level,  folate level, Hep B and C serologies, HIV serology, MDS panel given macrocytosis  -- Negative Hep C screening years ago with no new exposures.  -- She will go ahead and start 2,000 mcg B12 once daily.  --Evaluate for platelet abnormality with save smear. --Evaluate for liver disease and/or splenomegaly with abdominal US   Labs today HOLDING TX given thrombocytopenia, chronic anticoagulation and bruising RTC 1 weeks port labs, APP, TX    Lauraine HERO [facility], PA-C 10/20/20259:06 AM

## 2024-03-29 NOTE — Patient Instructions (Signed)
 [facility] - A DEPT OF [place]  Discharge Instructions: Thank you for choosing [facility] to provide your oncology and hematology care.   If you have a lab appointment with the Cancer Center, please go directly to the Cancer Center and check in at the registration area.  Wear comfortable clothing and clothing appropriate for easy access to any Portacath or PICC line.   We strive to give you quality time with your provider. You may need to reschedule your appointment if you arrive late (15 or more minutes).  Arriving late affects you and other patients whose appointments are after yours.  Also, if you miss three or more appointments without notifying the office, you may be dismissed from the clinic at the provider's discretion.      For prescription refill requests, have your pharmacy contact our office and allow 72 hours for refills to be completed.    Today you received the following chemotherapy and/or immunotherapy agents Kadcyla /Prolia      To help prevent nausea and vomiting after your treatment, we encourage you to take your nausea medication as directed.  BELOW ARE SYMPTOMS THAT SHOULD BE REPORTED IMMEDIATELY: *FEVER GREATER THAN 100.4 F (38 C) OR HIGHER *CHILLS OR SWEATING *NAUSEA AND VOMITING THAT IS NOT CONTROLLED WITH YOUR NAUSEA MEDICATION *UNUSUAL SHORTNESS OF BREATH *UNUSUAL BRUISING OR BLEEDING *URINARY PROBLEMS (pain or burning when urinating, or frequent urination) *BOWEL PROBLEMS (unusual diarrhea, constipation, pain near the anus) TENDERNESS IN MOUTH AND THROAT WITH OR WITHOUT PRESENCE OF ULCERS (sore throat, sores in mouth, or a toothache) UNUSUAL RASH, SWELLING OR PAIN  UNUSUAL VAGINAL DISCHARGE OR ITCHING   Items with * indicate a potential emergency and should be followed up as soon as possible or go to the Emergency Department if any problems should occur.  Please show the CHEMOTHERAPY ALERT CARD or  IMMUNOTHERAPY ALERT CARD at check-in to the Emergency Department and triage nurse. Should you have questions after your visit or need to cancel or reschedule your appointment, please contact [facility] - A DEPT OF [facility]  [phone] and follow the prompts.  Office hours are 8:00 a.m. to 4:30 p.m. Monday - Friday. Please note that voicemails left after 4:00 p.m. may not be returned until the following business day.  We are closed weekends and major holidays. You have access to a nurse at all times for urgent questions. Please call the main number to the clinic [phone] and follow the prompts.  For any non-urgent questions, you may also contact your provider using MyChart. We now offer e-Visits for anyone 27 and older to request care online for non-urgent symptoms. For details visit [email or web address].   Also download the MyChart app! Go to the app store, search MyChart, open the app, select [place], and log in with your MyChart username and password. Saw MD today. Plts 89 today. OK to treat per MD. [facility] - A DEPT OF [place]  Discharge Instructions: Thank you for choosing [facility] to provide your oncology and hematology care.   If you have a lab appointment with the Cancer Center, please go directly to the Cancer Center and check in at the registration area.  Wear comfortable clothing and clothing appropriate for easy access to any Portacath or PICC line.   We strive to give you quality time with your provider. You may need to  reschedule your appointment if you arrive late (15 or more minutes).  Arriving late affects you and other patients whose appointments are after yours.  Also, if you miss three or more appointments without notifying the office, you may be dismissed from the clinic at the provider's discretion.      For prescription refill requests, have your pharmacy contact our office  and allow 72 hours for refills to be completed.    Today you received the following chemotherapy and/or immunotherapy agents Kadcycla.     To help prevent nausea and vomiting after your treatment, we encourage you to take your nausea medication as directed.  BELOW ARE SYMPTOMS THAT SHOULD BE REPORTED IMMEDIATELY: *FEVER GREATER THAN 100.4 F (38 C) OR HIGHER *CHILLS OR SWEATING *NAUSEA AND VOMITING THAT IS NOT CONTROLLED WITH YOUR NAUSEA MEDICATION *UNUSUAL SHORTNESS OF BREATH *UNUSUAL BRUISING OR BLEEDING *URINARY PROBLEMS (pain or burning when urinating, or frequent urination) *BOWEL PROBLEMS (unusual diarrhea, constipation, pain near the anus) TENDERNESS IN MOUTH AND THROAT WITH OR WITHOUT PRESENCE OF ULCERS (sore throat, sores in mouth, or a toothache) UNUSUAL RASH, SWELLING OR PAIN  UNUSUAL VAGINAL DISCHARGE OR ITCHING   Items with * indicate a potential emergency and should be followed up as soon as possible or go to the Emergency Department if any problems should occur.  Please show the CHEMOTHERAPY ALERT CARD or IMMUNOTHERAPY ALERT CARD at check-in to the Emergency Department and triage nurse. Should you have questions after your visit or need to cancel or reschedule your appointment, please contact [facility] - A DEPT OF [facility]  [phone] and follow the prompts.  Office hours are 8:00 a.m. to 4:30 p.m. Monday - Friday. Please note that voicemails left after 4:00 p.m. may not be returned until the following business day.  We are closed weekends and major holidays. You have access to a nurse at all times for urgent questions. Please call the main number to the clinic [phone] and follow the prompts.  For any non-urgent questions, you may also contact your provider using MyChart. We now offer e-Visits for anyone 68 and older to request care online for non-urgent symptoms. For details visit [email or web address].   Also download the MyChart  app! Go to the app store, search MyChart, open the app, select [place], and log in with your MyChart username and password. Implanted [facility] Guide An implanted port is a device that is placed under the skin. It is usually placed in the chest. The device may vary based on the need. Implanted ports can be used to give IV medicine, to take blood, or to give fluids. You may have an implanted port if: You need IV medicine that would be irritating to the small veins in your hands or arms. You need IV medicines, such as chemotherapy, for a Timmothy Baranowski period of time. You need IV nutrition for a Rhett Najera period of time. You may have fewer limitations when using a port than you would if you used other types of Marck Mcclenny-term IVs. You will also likely be able to return to normal activities after your incision heals. An implanted port has two main parts: Reservoir. The reservoir is the part where a needle is inserted to give medicines or draw blood. The reservoir is round. After the port is placed, it appears as a small, raised area under your skin. Catheter. The catheter is a small, thin tube that connects the reservoir to a vein. Medicine that is inserted  into the reservoir goes into the catheter and then into the vein. How is my port accessed? To access your port: A numbing cream may be placed on the skin over the port site. Your health care provider will put on a mask and sterile gloves. The skin over your port will be cleaned carefully with a germ-killing soap and allowed to dry. Your health care provider will gently pinch the port and insert a needle into it. Your health care provider will check for a blood return to make sure the port is in the vein and is still working (patent). If your port needs to remain accessed to get medicine continuously (constant infusion), your health care provider will place a clear bandage (dressing) over the needle site. The dressing and needle will need to be changed every week,  or as told by your health care provider. What is flushing? Flushing helps keep the port working. Follow instructions from your health care provider about how and when to flush the port. Ports are usually flushed with saline solution or a medicine called heparin . The need for flushing will depend on how the port is used: If the port is only used from time to time to give medicines or draw blood, the port may need to be flushed: Before and after medicines have been given. Before and after blood has been drawn. As part of routine maintenance. Flushing may be recommended every 4-6 weeks. If a constant infusion is running, the port may not need to be flushed. Throw away any syringes in a disposal container that is meant for sharp items (sharps container). You can buy a sharps container from a pharmacy, or you can make one by using an empty hard plastic bottle with a cover. How Jodi Kappes will my port stay implanted? The port can stay in for as Joshawn Crissman as your health care provider thinks it is needed. When it is time for the port to come out, a surgery will be done to remove it. The surgery will be similar to the procedure that was done to put the port in. Follow these instructions at home: Caring for your port and port site Flush your port as told by your health care provider. If you need an infusion over several days, follow instructions from your health care provider about how to take care of your port site. Make sure you: Change your dressing as told by your health care provider. Wash your hands with soap and water for at least 20 seconds before and after you change your dressing. If soap and water are not available, use alcohol-based hand sanitizer. Place any used dressings or infusion bags into a plastic bag. Throw that bag in the trash. Keep the dressing that covers the needle clean and dry. Do not get it wet. Do not use scissors or sharp objects near the infusion tubing. Keep any external tubes clamped,  unless they are being used. Check your port site every day for signs of infection. Check for: Redness, swelling, or pain. Fluid or blood. Warmth. Pus or a bad smell. Protect the skin around the port site. Avoid wearing bra straps that rub or irritate the site. Protect the skin around your port from seat belts. Place a soft pad over your chest if needed. Bathe or shower as told by your health care provider. The site may get wet as Thoms Barthelemy as you are not actively receiving an infusion. General instructions  Return to your normal activities as told by your health care  provider. Ask your health care provider what activities are safe for you. Carry a medical alert card or wear a medical alert bracelet at all times. This will let health care providers know that you have an implanted port in case of an emergency. Where to find more information American Cancer Society: www.cancer.org American Society of Clinical Oncology: www.cancer.net Contact a health care provider if: You have a fever or chills. You have redness, swelling, or pain at the port site. You have fluid or blood coming from your port site. Your incision feels warm to the touch. You have pus or a bad smell coming from the port site. Summary Implanted ports are usually placed in the chest for Randi Poullard-term IV access. Follow instructions from your health care provider about flushing the port and changing bandages (dressings). Take care of the area around your port by avoiding clothing that puts pressure on the area, and by watching for signs of infection. Protect the skin around your port from seat belts. Place a soft pad over your chest if needed. Contact a health care provider if you have a fever or you have redness, swelling, pain, fluid, or a bad smell at the port site. This information is not intended to replace advice given to you by your health care provider. Make sure you discuss any questions you have with your health care  provider. Document Revised: 11/28/2020 Document Reviewed: 11/28/2020 Elsevier Patient Education  2024 [employer].

## 2024-03-30 ENCOUNTER — Other Ambulatory Visit: Payer: Self-pay

## 2024-03-30 LAB — HEPATITIS B CORE ANTIBODY, TOTAL: HEP B CORE AB: NEGATIVE

## 2024-04-01 ENCOUNTER — Ambulatory Visit: Payer: Self-pay | Admitting: Medical Oncology

## 2024-04-01 ENCOUNTER — Other Ambulatory Visit: Payer: Self-pay | Admitting: Medical Oncology

## 2024-04-01 LAB — MYELODYSPLASTIC SYNDROME (MDS), FISH PANEL

## 2024-04-01 LAB — METHYLMALONIC ACID, SERUM: Methylmalonic Acid, Quantitative: 267 nmol/L (ref 0–378)

## 2024-04-02 ENCOUNTER — Ambulatory Visit (HOSPITAL_BASED_OUTPATIENT_CLINIC_OR_DEPARTMENT_OTHER)
Admission: RE | Admit: 2024-04-02 | Discharge: 2024-04-02 | Disposition: A | Discharge status: Home / Self Care | Source: Ambulatory Visit | Attending: Medical Oncology | Admitting: Medical Oncology

## 2024-04-02 DIAGNOSIS — D696 Thrombocytopenia, unspecified: Secondary | ICD-10-CM | POA: Insufficient documentation

## 2024-04-05 ENCOUNTER — Inpatient Hospital Stay

## 2024-04-05 ENCOUNTER — Encounter: Payer: Self-pay | Admitting: Medical Oncology

## 2024-04-05 ENCOUNTER — Inpatient Hospital Stay: Admitting: Medical Oncology

## 2024-04-05 ENCOUNTER — Other Ambulatory Visit: Payer: Self-pay | Admitting: *Deleted

## 2024-04-05 VITALS — BP 109/71 | HR 67

## 2024-04-05 VITALS — BP 109/71 | HR 67 | Temp 97.8°F | Resp 18 | Ht 64.0 in | Wt 134.8 lb

## 2024-04-05 DIAGNOSIS — Z7901 Long term (current) use of anticoagulants: Secondary | ICD-10-CM

## 2024-04-05 DIAGNOSIS — J9811 Atelectasis: Secondary | ICD-10-CM

## 2024-04-05 DIAGNOSIS — D696 Thrombocytopenia, unspecified: Secondary | ICD-10-CM

## 2024-04-05 DIAGNOSIS — C50911 Malignant neoplasm of unspecified site of right female breast: Secondary | ICD-10-CM

## 2024-04-05 DIAGNOSIS — I2510 Atherosclerotic heart disease of native coronary artery without angina pectoris: Secondary | ICD-10-CM

## 2024-04-05 DIAGNOSIS — J9601 Acute respiratory failure with hypoxia: Secondary | ICD-10-CM

## 2024-04-05 DIAGNOSIS — Z170 Estrogen receptor positive status [ER+]: Secondary | ICD-10-CM

## 2024-04-05 DIAGNOSIS — Z5112 Encounter for antineoplastic immunotherapy: Secondary | ICD-10-CM | POA: Diagnosis not present

## 2024-04-05 DIAGNOSIS — C50011 Malignant neoplasm of nipple and areola, right female breast: Secondary | ICD-10-CM

## 2024-04-05 DIAGNOSIS — I4821 Permanent atrial fibrillation: Secondary | ICD-10-CM

## 2024-04-05 DIAGNOSIS — I214 Non-ST elevation (NSTEMI) myocardial infarction: Secondary | ICD-10-CM

## 2024-04-05 DIAGNOSIS — E782 Mixed hyperlipidemia: Secondary | ICD-10-CM

## 2024-04-05 DIAGNOSIS — R040 Epistaxis: Secondary | ICD-10-CM

## 2024-04-05 DIAGNOSIS — I340 Nonrheumatic mitral (valve) insufficiency: Secondary | ICD-10-CM

## 2024-04-05 LAB — CMP (CANCER CENTER ONLY)
ALT: 26 U/L (ref 0–44)
AST: 41 U/L (ref 15–41)
Albumin: 4 g/dL (ref 3.5–5.0)
Alkaline Phosphatase: 91 U/L (ref 38–126)
Anion gap: 12 (ref 5–15)
BUN: 8 mg/dL (ref 8–23)
CO2: 24 mmol/L (ref 22–32)
Calcium: 8.9 mg/dL (ref 8.9–10.3)
Chloride: 102 mmol/L (ref 98–111)
Creatinine: 0.65 mg/dL (ref 0.44–1.00)
GFR, Estimated: >60 mL/min (ref >60)
Glucose, Bld: 103 mg/dL — ABNORMAL HIGH (ref 70–99)
Potassium: 3.8 mmol/L (ref 3.5–5.1)
Sodium: 138 mmol/L (ref 135–145)
Total Bilirubin: 1.3 mg/dL — ABNORMAL HIGH (ref 0.0–1.2)
Total Protein: 6.7 g/dL (ref 6.5–8.1)

## 2024-04-05 LAB — CBC WITH DIFFERENTIAL (CANCER CENTER ONLY)
Abs Immature Granulocytes: 0.01 K/uL (ref 0.00–0.07)
Basophils Absolute: 0 K/uL (ref 0.0–0.1)
Basophils Relative: 1 %
Eosinophils Absolute: 0.1 K/uL (ref 0.0–0.5)
Eosinophils Relative: 2 %
HCT: 35.5 % — ABNORMAL LOW (ref 36.0–46.0)
Hemoglobin: 12 g/dL (ref 12.0–15.0)
Immature Granulocytes: 0 %
Lymphocytes Relative: 39 %
Lymphs Abs: 1.3 K/uL (ref 0.7–4.0)
MCH: 36.1 pg — ABNORMAL HIGH (ref 26.0–34.0)
MCHC: 33.8 g/dL (ref 30.0–36.0)
MCV: 106.9 fL — ABNORMAL HIGH (ref 80.0–100.0)
Monocytes Absolute: 0.4 K/uL (ref 0.1–1.0)
Monocytes Relative: 11 %
Neutro Abs: 1.6 K/uL — ABNORMAL LOW (ref 1.7–7.7)
Neutrophils Relative %: 47 %
Platelet Count: 72 K/uL — ABNORMAL LOW (ref 150–400)
RBC: 3.32 MIL/uL — ABNORMAL LOW (ref 3.87–5.11)
RDW: 15.6 % — ABNORMAL HIGH (ref 11.5–15.5)
WBC Count: 3.5 K/uL — ABNORMAL LOW (ref 4.0–10.5)
nRBC: 0 % (ref 0.0–0.2)

## 2024-04-05 MED ORDER — CYANOCOBALAMIN 1000 MCG/ML IJ SOLN
1000.0000 ug | Freq: Once | INTRAMUSCULAR | Status: AC
  Administered 2024-04-05: 1000 ug via INTRAMUSCULAR
  Filled 2024-04-05: qty 1

## 2024-04-05 NOTE — Progress Notes (Addendum)
 Hematology and Oncology Follow Up Visit  Rhonda Paul [id number] Apr 16, 1951 73 y.o. 04/05/2024   Principle Diagnosis:  Stage IA ([id number]) invasive ductal carcinoma of the right breast -- ER+/PR-/HER2+ Remote history of stage I-triple negative-cancer of the right breast-2004  Previous Therapy: Status post right mastectomy --07/04/2023 Adjuvant Carbo/Taxotere /Herceptin / --s/p cycle #4/4 -- start on 08/11/2023  Current Therapy:   Kadcyla  3.6 mg/m2 IV q 3 weeks -- s/p cycle #6 - start on 11/24/2023 Femara  2.5 mg po q day -- start on 11/18/2023 Prolia 60 mg SQ every 6 months -next dose 08/2024     Interim History:  Rhonda Paul is back for follow-up and consideration for treatment.   Today she reports being well. Bruising on her arm is resolving. No new bruising. No falls.   She is eating well.  She has had no problems with cough or shortness of breath.  She has had no nausea or vomiting.  She has had no change in bowel or bladder habits.  She is doing well on the Femara . No reported side effects  Her platelet count has been down a little bit which is suspected to be secondary to the Kadcyla . There has been no bleeding to her knowledge: denies epistaxis, gingivitis, hemoptysis, hematemesis, hematuria, melena, excessive bruising, blood donation.   She has had no neuropathy.  Overall, I would have to say that her performance status is probably ECOG 1.       Wt Readings from Last 3 Encounters:  04/05/24 134 lb 13.1 oz (61.2 kg)  03/29/24 134 lb (60.8 kg)  03/08/24 134 lb (60.8 kg)     Medications:  Current Outpatient Medications:    apixaban  (ELIQUIS ) 5 MG TABS tablet, Take 1 tablet (5 mg total) by mouth 2 (two) times daily., Disp: 60 tablet, Rfl: 5   atorvastatin  (LIPITOR ) 40 MG tablet, TAKE ONE TABLET BY MOUTH DAILY, Disp: 90 tablet, Rfl: 3   Biotin w/ Vitamins C & E (HAIR/SKIN/NAILS PO), Take 1 capsule by mouth daily., Disp: , Rfl:    Cholecalciferol 50 MCG (2000 UT) CAPS,  Take 1 capsule by mouth daily., Disp: , Rfl:    Cyanocobalamin (VITAMIN B 12) 500 MCG TABS, Take 2,000 mcg by mouth daily., Disp: , Rfl:    dapagliflozin  propanediol (FARXIGA ) 10 MG TABS tablet, TAKE 1 TABLET BY MOUTH DAILY BEFORE BREAKFAST, Disp: 90 tablet, Rfl: 3   ezetimibe  (ZETIA ) 10 MG tablet, Take 1 tablet (10 mg total) by mouth daily., Disp: 90 tablet, Rfl: 3   ipratropium (ATROVENT ) 0.03 % nasal spray, Place 1 spray into both nostrils daily., Disp: , Rfl:    letrozole  (FEMARA ) 2.5 MG tablet, Take 1 tablet (2.5 mg total) by mouth daily., Disp: 30 tablet, Rfl: 12   levothyroxine  (SYNTHROID ) 50 MCG tablet, TAKE 1 TABLET BY MOUTH DAILY, Disp: 90 tablet, Rfl: 0   lidocaine -prilocaine  (EMLA ) cream, Apply 1 Application topically as needed. Apply one hour prior to port access and cover with plastic wrap, Disp: 30 g, Rfl: 0   metoprolol  succinate (TOPROL -XL) 50 MG 24 hr tablet, Take 1 tablet (50 mg total) by mouth daily. Take with or immediately following a meal., Disp: 90 tablet, Rfl: 3   nitroGLYCERIN  (NITROSTAT ) 0.4 MG SL tablet, Place 1 tablet (0.4 mg total) under the tongue every 5 (five) minutes as needed for chest pain., Disp: 25 tablet, Rfl: 3   NON FORMULARY, Pt uses cpap nightly, Disp: , Rfl:   Allergies:  Allergies  Allergen Reactions   Lambs Alexa  Hives, Itching and Nausea And Vomiting   Entresto  [Sacubitril -Valsartan ] Other (See Comments)    Low BP   Lanolin Acid [Lanolin] Rash   Other Itching and Rash    wool    Past Medical History, Surgical history, Social history, and Family History were reviewed and updated.  Review of Systems: Review of Systems  Constitutional: Negative.   HENT:  Negative.    Eyes: Negative.   Respiratory: Negative.    Cardiovascular: Negative.   Gastrointestinal: Negative.   Endocrine: Negative.   Genitourinary: Negative.  Negative for difficulty urinating.   Musculoskeletal: Negative.   Skin: Negative.   Neurological: Negative.    Hematological: Negative.   Psychiatric/Behavioral: Negative.      Physical Exam:  height is 5' 4 (1.626 m) and weight is 134 lb 13.1 oz (61.2 kg). Her oral temperature is 97.8 F (36.6 C). Her blood pressure is 109/71 and her pulse is 67. Her respiration is 18 and oxygen saturation is 98%.   Wt Readings from Last 3 Encounters:  04/05/24 134 lb 13.1 oz (61.2 kg)  03/29/24 134 lb (60.8 kg)  03/08/24 134 lb (60.8 kg)    Physical Exam Vitals reviewed.  HENT:     Head: Normocephalic and atraumatic.  Eyes:     Pupils: Pupils are equal, round, and reactive to light.  Cardiovascular:     Rate and Rhythm: Normal rate and regular rhythm.     Heart sounds: Normal heart sounds.  Pulmonary:     Effort: Pulmonary effort is normal.     Breath sounds: Normal breath sounds.  Abdominal:     General: Bowel sounds are normal.     Palpations: Abdomen is soft.  Musculoskeletal:        General: No tenderness or deformity. Normal range of motion.     Cervical back: Normal range of motion.  Lymphadenopathy:     Cervical: No cervical adenopathy.  Skin:    General: Skin is warm and dry.     Findings: No bruising, erythema or rash.  Neurological:     Mental Status: She is alert and oriented to person, place, and time.  Psychiatric:        Behavior: Behavior normal.        Thought Content: Thought content normal.        Judgment: Judgment normal.      Lab Results  Component Value Date   WBC 3.5 (L) 04/05/2024   HGB 12.0 04/05/2024   HCT 35.5 (L) 04/05/2024   MCV 106.9 (H) 04/05/2024   PLT 72 (L) 04/05/2024     Chemistry      Component Value Date/Time   NA 138 03/29/2024 0846   NA 141 03/24/2023 0832   NA 142 01/30/2017 0847   NA 140 02/01/2016 0841   K 3.6 03/29/2024 0846   K 4.9 (H) 01/30/2017 0847   K 4.8 02/01/2016 0841   CL 102 03/29/2024 0846   CL 104 01/30/2017 0847   CO2 24 03/29/2024 0846   CO2 32 01/30/2017 0847   CO2 27 02/01/2016 0841   BUN 7 (L) 03/29/2024 0846    BUN 9 03/24/2023 0832   BUN 11 01/30/2017 0847   BUN 12.7 02/01/2016 0841   CREATININE 0.66 03/29/2024 0846   CREATININE 1.0 01/30/2017 0847   CREATININE 0.8 02/01/2016 0841      Component Value Date/Time   CALCIUM  9.1 03/29/2024 0846   CALCIUM  9.6 01/30/2017 0847   CALCIUM  10.0 02/01/2016 0841   ALKPHOS  102 03/29/2024 0846   ALKPHOS 51 01/30/2017 0847   ALKPHOS 59 02/01/2016 0841   AST 47 (H) 03/29/2024 0846   AST 22 02/01/2016 0841   ALT 28 03/29/2024 0846   ALT 25 01/30/2017 0847   ALT 25 02/01/2016 0841   BILITOT 1.3 (H) 03/29/2024 0846   BILITOT 0.93 02/01/2016 0841     Encounter Diagnoses  Name Primary?   Breast cancer, stage 1, estrogen receptor positive, right (HCC) Yes   Thrombocytopenia    Chronic anticoagulation      Impression and Plan: Rhonda Paul is a very nice 73 year old white female.  She had a remote history of early-stage breast cancer of the right breast.  She had adjuvant chemotherapy for that.  She subsequently was found to have a new breast cancer in the right breast.  This cancer is HER2 positive./ER positive.  She underwent mastectomy.  We gave her adjuvant chemotherapy.  She completed this in May 2025.  She now is on Kadcyla .  She is doing well on the Kadcyla  other than thrombocytopenia.   Per package insert: In the event of decreased platelet count to Grade >= 3 (<50,000/mm3), do not administer KADCYLA  until platelet counts recover to Grade 1 (>=75,000/mm3). Closely monitor patients with thrombocytopenia (< 100,000/mm3) and patients on anti-coagulant treatment during treatment with KADCYLA .  She also continues on the Femara   She is on 2,000 mcg B12 once daily. She is now switching to IM injections weekly x 4 then monthly. This has improved her counts a bit with a value of 72,000 today- also had TX held last time which also could have improved counts. US  abdomen from 04/02/2024 showed no significant abnormality. MDS on 03/29/2024 normal.   B12  injection today.  RTC weekly for B12 injections x 4 then once monthly x 12 RTC Thursday or Friday port labs, APP, TX if Platelets are > 75,000   Lauraine CHRISTELLA Dais, PA-C 10/27/202510:23 AM

## 2024-04-06 ENCOUNTER — Other Ambulatory Visit: Payer: Self-pay

## 2024-04-07 ENCOUNTER — Other Ambulatory Visit: Payer: Self-pay

## 2024-04-07 DIAGNOSIS — C50911 Malignant neoplasm of unspecified site of right female breast: Secondary | ICD-10-CM

## 2024-04-08 ENCOUNTER — Encounter: Payer: Self-pay | Admitting: *Deleted

## 2024-04-08 ENCOUNTER — Inpatient Hospital Stay

## 2024-04-08 VITALS — BP 112/76 | HR 67 | Resp 18

## 2024-04-08 DIAGNOSIS — E782 Mixed hyperlipidemia: Secondary | ICD-10-CM

## 2024-04-08 DIAGNOSIS — J9811 Atelectasis: Secondary | ICD-10-CM

## 2024-04-08 DIAGNOSIS — C50911 Malignant neoplasm of unspecified site of right female breast: Secondary | ICD-10-CM

## 2024-04-08 DIAGNOSIS — Z5112 Encounter for antineoplastic immunotherapy: Secondary | ICD-10-CM | POA: Diagnosis not present

## 2024-04-08 DIAGNOSIS — I4821 Permanent atrial fibrillation: Secondary | ICD-10-CM

## 2024-04-08 DIAGNOSIS — I340 Nonrheumatic mitral (valve) insufficiency: Secondary | ICD-10-CM

## 2024-04-08 DIAGNOSIS — R040 Epistaxis: Secondary | ICD-10-CM

## 2024-04-08 DIAGNOSIS — D696 Thrombocytopenia, unspecified: Secondary | ICD-10-CM

## 2024-04-08 DIAGNOSIS — Z7901 Long term (current) use of anticoagulants: Secondary | ICD-10-CM

## 2024-04-08 DIAGNOSIS — C50011 Malignant neoplasm of nipple and areola, right female breast: Secondary | ICD-10-CM

## 2024-04-08 DIAGNOSIS — I2510 Atherosclerotic heart disease of native coronary artery without angina pectoris: Secondary | ICD-10-CM

## 2024-04-08 DIAGNOSIS — J9601 Acute respiratory failure with hypoxia: Secondary | ICD-10-CM

## 2024-04-08 DIAGNOSIS — I214 Non-ST elevation (NSTEMI) myocardial infarction: Secondary | ICD-10-CM

## 2024-04-08 LAB — CMP (CANCER CENTER ONLY)
ALT: 26 U/L (ref 0–44)
AST: 40 U/L (ref 15–41)
Albumin: 4 g/dL (ref 3.5–5.0)
Alkaline Phosphatase: 85 U/L (ref 38–126)
Anion gap: 11 (ref 5–15)
BUN: 8 mg/dL (ref 8–23)
CO2: 25 mmol/L (ref 22–32)
Calcium: 9.3 mg/dL (ref 8.9–10.3)
Chloride: 102 mmol/L (ref 98–111)
Creatinine: 0.68 mg/dL (ref 0.44–1.00)
GFR, Estimated: >60 mL/min (ref >60)
Glucose, Bld: 112 mg/dL — ABNORMAL HIGH (ref 70–99)
Potassium: 3.9 mmol/L (ref 3.5–5.1)
Sodium: 138 mmol/L (ref 135–145)
Total Bilirubin: 1.5 mg/dL — ABNORMAL HIGH (ref 0.0–1.2)
Total Protein: 6.9 g/dL (ref 6.5–8.1)

## 2024-04-08 LAB — CBC WITH DIFFERENTIAL (CANCER CENTER ONLY)
Abs Immature Granulocytes: 0.01 K/uL (ref 0.00–0.07)
Basophils Absolute: 0.1 K/uL (ref 0.0–0.1)
Basophils Relative: 1 %
Eosinophils Absolute: 0.1 K/uL (ref 0.0–0.5)
Eosinophils Relative: 2 %
HCT: 36.3 % (ref 36.0–46.0)
Hemoglobin: 12.3 g/dL (ref 12.0–15.0)
Immature Granulocytes: 0 %
Lymphocytes Relative: 46 %
Lymphs Abs: 1.6 K/uL (ref 0.7–4.0)
MCH: 36.3 pg — ABNORMAL HIGH (ref 26.0–34.0)
MCHC: 33.9 g/dL (ref 30.0–36.0)
MCV: 107.1 fL — ABNORMAL HIGH (ref 80.0–100.0)
Monocytes Absolute: 0.4 K/uL (ref 0.1–1.0)
Monocytes Relative: 11 %
Neutro Abs: 1.4 K/uL — ABNORMAL LOW (ref 1.7–7.7)
Neutrophils Relative %: 40 %
Platelet Count: 81 K/uL — ABNORMAL LOW (ref 150–400)
RBC: 3.39 MIL/uL — ABNORMAL LOW (ref 3.87–5.11)
RDW: 15.4 % (ref 11.5–15.5)
WBC Count: 3.5 K/uL — ABNORMAL LOW (ref 4.0–10.5)
nRBC: 0 % (ref 0.0–0.2)

## 2024-04-08 MED ORDER — SODIUM CHLORIDE 0.9 % IV SOLN
3.6000 mg/kg | Freq: Once | INTRAVENOUS | Status: AC
  Administered 2024-04-08: 220 mg via INTRAVENOUS
  Filled 2024-04-08: qty 8

## 2024-04-08 MED ORDER — ACETAMINOPHEN 325 MG PO TABS
650.0000 mg | ORAL_TABLET | Freq: Once | ORAL | Status: AC
  Administered 2024-04-08: 650 mg via ORAL
  Filled 2024-04-08: qty 2

## 2024-04-08 MED ORDER — DIPHENHYDRAMINE HCL 25 MG PO CAPS
50.0000 mg | ORAL_CAPSULE | Freq: Once | ORAL | Status: AC
  Administered 2024-04-08: 50 mg via ORAL
  Filled 2024-04-08: qty 2

## 2024-04-08 MED ORDER — SODIUM CHLORIDE 0.9 % IV SOLN: INTRAVENOUS | Status: DC

## 2024-04-08 MED ORDER — PROCHLORPERAZINE MALEATE 10 MG PO TABS
10.0000 mg | ORAL_TABLET | Freq: Once | ORAL | Status: AC
  Administered 2024-04-08: 10 mg via ORAL
  Filled 2024-04-08: qty 1

## 2024-04-08 NOTE — Progress Notes (Signed)
 Patient was scheduled to be treated last week, but held due to thrombocytopenia. She has otherwise good treatment tolerance. Today her platelets have improved and she will receive cycle seven  Oncology Nurse Navigator Documentation     04/08/2024   12:30 PM  Oncology Nurse Navigator Flowsheets  Navigator Follow Up Date: 04/27/2024  Navigator Follow Up Reason: Follow-up Appointment;Chemotherapy  Navigator Location [facility]  Navigator Encounter Type Treatment  Patient Visit Type MedOnc  Treatment Phase Active Tx  Barriers/Navigation Needs No Barriers At This Time  Interventions Psycho-Social Support  Acuity Level 1-No Barriers  Support Groups/Services Friends and Family  Time Spent with Patient 15

## 2024-04-08 NOTE — Patient Instructions (Signed)
 [facility] - A DEPT OF [facility]  Discharge Instructions: Thank you for choosing [facility] to provide your oncology and hematology care.   If you have a lab appointment with the Cancer Center, please go directly to the Cancer Center and check in at the registration area.  Wear comfortable clothing and clothing appropriate for easy access to any Portacath or PICC line.   We strive to give you quality time with your provider. You may need to reschedule your appointment if you arrive late (15 or more minutes).  Arriving late affects you and other patients whose appointments are after yours.  Also, if you miss three or more appointments without notifying the office, you may be dismissed from the clinic at the provider's discretion.      For prescription refill requests, have your pharmacy contact our office and allow 72 hours for refills to be completed.    Today you received the following chemotherapy and/or immunotherapy agents Kadcyla      To help prevent nausea and vomiting after your treatment, we encourage you to take your nausea medication as directed.  BELOW ARE SYMPTOMS THAT SHOULD BE REPORTED IMMEDIATELY: *FEVER GREATER THAN 100.4 F (38 C) OR HIGHER *CHILLS OR SWEATING *NAUSEA AND VOMITING THAT IS NOT CONTROLLED WITH YOUR NAUSEA MEDICATION *UNUSUAL SHORTNESS OF BREATH *UNUSUAL BRUISING OR BLEEDING *URINARY PROBLEMS (pain or burning when urinating, or frequent urination) *BOWEL PROBLEMS (unusual diarrhea, constipation, pain near the anus) TENDERNESS IN MOUTH AND THROAT WITH OR WITHOUT PRESENCE OF ULCERS (sore throat, sores in mouth, or a toothache) UNUSUAL RASH, SWELLING OR PAIN  UNUSUAL VAGINAL DISCHARGE OR ITCHING   Items with * indicate a potential emergency and should be followed up as soon as possible or go to the Emergency Department if any problems should occur.  Please show the CHEMOTHERAPY ALERT CARD or IMMUNOTHERAPY  ALERT CARD at check-in to the Emergency Department and triage nurse. Should you have questions after your visit or need to cancel or reschedule your appointment, please contact [facility] - A DEPT OF [facility]  [phone] and follow the prompts.  Office hours are 8:00 a.m. to 4:30 p.m. Monday - Friday. Please note that voicemails left after 4:00 p.m. may not be returned until the following business day.  We are closed weekends and major holidays. You have access to a nurse at all times for urgent questions. Please call the main number to the clinic [phone] and follow the prompts.  For any non-urgent questions, you may also contact your provider using MyChart. We now offer e-Visits for anyone 87 and older to request care online for non-urgent symptoms. For details visit [email or web address].   Also download the MyChart app! Go to the app store, search "MyChart", open the app, select [facility], and log in with your MyChart username and password.

## 2024-04-08 NOTE — Progress Notes (Signed)
 OK to treat with platelets 81 and ANC 1.4 per Dr. Timmy

## 2024-04-08 NOTE — Patient Instructions (Addendum)

## 2024-04-11 ENCOUNTER — Other Ambulatory Visit (HOSPITAL_BASED_OUTPATIENT_CLINIC_OR_DEPARTMENT_OTHER): Payer: Self-pay | Admitting: Obstetrics & Gynecology

## 2024-04-12 ENCOUNTER — Inpatient Hospital Stay: Attending: Hematology & Oncology

## 2024-04-12 VITALS — BP 120/61 | HR 73 | Temp 98.1°F | Resp 16

## 2024-04-12 DIAGNOSIS — Z1731 Human epidermal growth factor receptor 2 positive status: Secondary | ICD-10-CM | POA: Diagnosis not present

## 2024-04-12 DIAGNOSIS — D696 Thrombocytopenia, unspecified: Secondary | ICD-10-CM | POA: Insufficient documentation

## 2024-04-12 DIAGNOSIS — D510 Vitamin B12 deficiency anemia due to intrinsic factor deficiency: Secondary | ICD-10-CM | POA: Insufficient documentation

## 2024-04-12 DIAGNOSIS — Z170 Estrogen receptor positive status [ER+]: Secondary | ICD-10-CM | POA: Diagnosis not present

## 2024-04-12 DIAGNOSIS — C50911 Malignant neoplasm of unspecified site of right female breast: Secondary | ICD-10-CM | POA: Diagnosis not present

## 2024-04-12 DIAGNOSIS — C50011 Malignant neoplasm of nipple and areola, right female breast: Secondary | ICD-10-CM

## 2024-04-12 MED ORDER — CYANOCOBALAMIN 1000 MCG/ML IJ SOLN
1000.0000 ug | Freq: Once | INTRAMUSCULAR | Status: AC
  Administered 2024-04-12: 1000 ug via INTRAMUSCULAR
  Filled 2024-04-12: qty 1

## 2024-04-12 NOTE — Patient Instructions (Signed)
 Vitamin B12 Deficiency Vitamin B12 deficiency means that your body does not have enough vitamin B12. The body needs this important vitamin: To make red blood cells. To make genes (DNA). To help the nerves work. If you do not have enough vitamin B12 in your body, you can have health problems, such as not having enough red blood cells in the blood (anemia). What are the causes? Not eating enough foods that contain vitamin B12. Not being able to take in (absorb) vitamin B12 from the food that you eat. Certain diseases. A condition in which the body does not make enough of a certain protein. This results in your body not taking in enough vitamin B12. Having a surgery in which part of the stomach or small intestine is taken out. Taking medicines that make it hard for the body to take in vitamin B12. These include: Heartburn medicines. Some medicines that are used to treat diabetes. What increases the risk? Being an older adult. Eating a vegetarian or vegan diet that does not include any foods that come from animals. Not eating enough foods that contain vitamin B12 while you are pregnant. Taking certain medicines. Having alcoholism. What are the signs or symptoms? In some cases, there are no symptoms. If the condition leads to too few blood cells or nerve damage, symptoms can occur, such as: Feeling weak or tired. Not being hungry. Losing feeling (numbness) or tingling in your hands and feet. Redness and burning of the tongue. Feeling sad (depressed). Confusion or memory problems. Trouble walking. If anemia is very bad, symptoms can include: Being short of breath. Being dizzy. Having a very fast heartbeat. How is this treated? Changing the way you eat and drink, such as: Eating more foods that contain vitamin B12. Drinking little or no alcohol. Getting vitamin B12 shots. Taking vitamin B12 supplements by mouth (orally). Your doctor will tell you the dose that is best for you. Follow  these instructions at home: Eating and drinking  Eat foods that come from animals and have a lot of vitamin B12 in them. These include: Meats and poultry. This includes beef, pork, chicken, [place], and organ meats, such as liver. Seafood, such as clams, rainbow trout, salmon, tuna, and haddock. Eggs. Dairy foods such as milk, yogurt, and cheese. Eat breakfast cereals that have vitamin B12 added to them (are fortified). Check the label. The items listed above may not be a complete list of foods and beverages you can eat and drink. Contact a dietitian for more information. Alcohol use Do not drink alcohol if: Your doctor tells you not to drink. You are pregnant, may be pregnant, or are planning to become pregnant. If you drink alcohol: Limit how much you have to: 0-1 drink a day for women. 0-2 drinks a day for men. Know how much alcohol is in your drink. In the [place] one drink equals one 12 oz bottle of beer (355 mL), one 5 oz glass of wine (148 mL), or one 1 oz glass of hard liquor (44 mL). General instructions Get any vitamin B12 shots if told by your doctor. Take supplements only as told by your doctor. Follow the directions. Keep all follow-up visits. Contact a doctor if: Your symptoms come back. Your symptoms get worse or do not get better with treatment. Get help right away if: You have trouble breathing. You have a very fast heartbeat. You have chest pain. You get dizzy. You faint. These symptoms may be an emergency. Get help right away. Call 911.  Do not wait to see if the symptoms will go away. Do not drive yourself to the hospital. Summary Vitamin B12 deficiency means that your body is not getting enough of the vitamin. In some cases, there are no symptoms of this condition. Treatment may include making a change in the way you eat and drink, getting shots, or taking supplements. Eat foods that have vitamin B12 in them. This information is not intended to replace advice  given to you by your health care provider. Make sure you discuss any questions you have with your health care provider. Document Revised: 01/19/2021 Document Reviewed: 01/19/2021 Elsevier Patient Education  2024 [employer].

## 2024-04-13 ENCOUNTER — Other Ambulatory Visit: Payer: Self-pay

## 2024-04-19 ENCOUNTER — Ambulatory Visit: Attending: Surgery

## 2024-04-19 ENCOUNTER — Inpatient Hospital Stay

## 2024-04-19 ENCOUNTER — Other Ambulatory Visit: Payer: Self-pay | Admitting: Family Medicine

## 2024-04-19 VITALS — Wt 131.4 lb

## 2024-04-19 VITALS — BP 109/64 | HR 72 | Temp 99.0°F | Resp 18

## 2024-04-19 DIAGNOSIS — D696 Thrombocytopenia, unspecified: Secondary | ICD-10-CM | POA: Diagnosis not present

## 2024-04-19 DIAGNOSIS — C50011 Malignant neoplasm of nipple and areola, right female breast: Secondary | ICD-10-CM

## 2024-04-19 DIAGNOSIS — D510 Vitamin B12 deficiency anemia due to intrinsic factor deficiency: Secondary | ICD-10-CM | POA: Diagnosis not present

## 2024-04-19 DIAGNOSIS — Z170 Estrogen receptor positive status [ER+]: Secondary | ICD-10-CM | POA: Diagnosis not present

## 2024-04-19 DIAGNOSIS — C50911 Malignant neoplasm of unspecified site of right female breast: Secondary | ICD-10-CM | POA: Diagnosis not present

## 2024-04-19 DIAGNOSIS — Z1731 Human epidermal growth factor receptor 2 positive status: Secondary | ICD-10-CM | POA: Diagnosis not present

## 2024-04-19 DIAGNOSIS — Z1231 Encounter for screening mammogram for malignant neoplasm of breast: Secondary | ICD-10-CM

## 2024-04-19 DIAGNOSIS — Z483 Aftercare following surgery for neoplasm: Secondary | ICD-10-CM | POA: Insufficient documentation

## 2024-04-19 MED ORDER — CYANOCOBALAMIN 1000 MCG/ML IJ SOLN
1000.0000 ug | Freq: Once | INTRAMUSCULAR | Status: AC
  Administered 2024-04-19: 1000 ug via INTRAMUSCULAR
  Filled 2024-04-19: qty 1

## 2024-04-19 NOTE — Therapy (Signed)
 OUTPATIENT PHYSICAL THERAPY SOZO SCREENING NOTE   Patient Name: Rhonda Paul MRN: [id number] DOB:[birth year], 73 y.o., female Today's Date: 04/19/2024  PCP: Cleotilde Planas, MD REFERRING PROVIDER: Vanderbilt Ned, MD   PT End of Session - 04/19/24 [id number]     Visit Number 2   # unchanged due to screen only   PT Start Time 0837    PT Stop Time 0841    PT Time Calculation (min) 4 min    Activity Tolerance Patient tolerated treatment well    Behavior During Therapy [facility] for tasks assessed/performed          Past Medical History:  Diagnosis Date   Adenocarcinoma of breast (HCC) 11/2001   RIGHT   Arthritis    CHF (congestive heart failure) (HCC)    Chronic anticoagulation    Eliquis    Coronary artery disease    Nonobstructive   Diverticulitis 2009   diverticulitis   Dysrhythmia    A. Fib   Heart attack (HCC)    One week after stroke   Heart murmur    HOH (hard of hearing)    right ear; wears hearing aid   Hypothyroidism    Infertility, female 02/2000   4 SAB   Metrorrhagia    Migraine 02/2000   Permanent atrial fibrillation (HCC) 04/01/2014   Personal history of chemotherapy 2003   Personal history of radiation therapy 2003   Sleep apnea    STD (sexually transmitted disease) 1976   Hx of HSV   Stroke (HCC) 01/03/2015   s/p TPA R-MCA   Vitamin D  deficiency 07/2006   Past Surgical History:  Procedure Laterality Date   APPENDECTOMY  1959   BREAST BIOPSY Right 05/19/2023   US  RT BREAST BX W LOC DEV 1ST LESION IMG BX SPEC US  GUIDE 05/19/2023 [facility] MAMMOGRAPHY   BREAST SURGERY Right 11/2001   lumpectomy with sentinel node biopsy X 3 all negative, ER/PR negative , chemo 6 doses, and radiation daily X 6 weeks   BUBBLE STUDY  04/18/2022   Procedure: BUBBLE STUDY;  Surgeon: Santo Stanly LABOR, MD;  Location: [facility];  Service: Cardiovascular;;   CARDIAC CATHETERIZATION N/A 01/16/2015   Procedure: Left Heart Cath and Coronary Angiography;  Surgeon: Victory LELON Sharps, MD; OM1 75%, RCA 30%, EF normal; spasm in RCA w/ catheter engagement    COLONOSCOPY  11/2012   diverticulitis, 2 polyps negative, repeat in 5 years   HALLUX FUSION Left 03/03/2017   Procedure: HALLUX RIGIDUS CORRECTION WITH COLECTOMY DEBRIDEMENT AND CAPSULAR RELEASE OF THE FIRST MPJ WITH IMPLANT LEFT FOOT;  Surgeon: Zan Factor, DPM;  Location: [facility];  Service: Podiatry;  Laterality: Left;   JOINT REPLACEMENT Left 02/2017   big toe   MASTECTOMY W/ SENTINEL NODE BIOPSY Right 07/04/2023   Procedure: RIGHT SIMPLE MASTECTOMY SENTINEL LYMPH NODE MAPPING;  Surgeon: Vanderbilt Ned, MD;  Location: [facility] OR;  Service: General;  Laterality: Right;  PEC BLOCK   PORT-A-CATH REMOVAL  2004   PORTA CATH INSERTION  2003   PORTACATH PLACEMENT Right 07/04/2023   Procedure: INSERTION PORT-A-CATH WITH ULTRASOUND GUIDANCE;  Surgeon: Vanderbilt Ned, MD;  Location: [facility] OR;  Service: General;  Laterality: Right;   RADIOLOGY WITH ANESTHESIA N/A 01/03/2015   Procedure: RADIOLOGY WITH ANESTHESIA;  Surgeon: Medication Radiologist, MD;  Location: [facility] OR;  Service: Radiology;  Laterality: N/A;   TEE WITHOUT CARDIOVERSION N/A 04/18/2022   Procedure: TRANSESOPHAGEAL ECHOCARDIOGRAM (TEE);  Surgeon: Santo Stanly LABOR, MD;  Location: [facility]COPY;  Service:  Cardiovascular;  Laterality: N/A;   Patient Active Problem List   Diagnosis Date Noted   Breast cancer, stage 1, estrogen receptor positive, right (HCC) 07/04/2023   CAD in native artery 07/28/2016   Chronic anticoagulation     NSTEMI- med rx, culprit lesion 75% OM1 01/14/2015   Atelectasis    Acute respiratory failure with hypoxemia (HCC)    Cerebral infarction (HCC) 01/03/2015   Permanent atrial fibrillation (HCC) 04/01/2014   Moderate mitral regurgitation 04/01/2014   Breast cancer (HCC) 04/01/2014   Hyperlipidemia 04/01/2014    REFERRING DIAG: right breast cancer at risk for lymphedema  THERAPY DIAG: Aftercare following  surgery for neoplasm  PERTINENT HISTORY: Patient was diagnosed with right grade 2 IDC. It measures 1.3 cm and is located in the upper inner quadrant. It is ER pos, PR neg, HER2pos with a Ki67 of 15%. Hx of Rt breast cancer 20 years ago with lumpectomy and radiation and chemotherapy. Rt mastectomy on 07/04/23 with 1 negative node removed. Will start chemo on 08/11/23. Moderate mitral valve regurgitation and Afib (4 total with the previous lumpectomy)   PRECAUTIONS: right UE Lymphedema risk  SUBJECTIVE: Pt returns for her 3 month L-Dex screen.   PAIN:  Are you having pain? No  SOZO SCREENING: Patient was assessed today using the SOZO machine to determine the lymphedema index score. This was compared to her baseline score. It was determined that she is within the recommended range when compared to her baseline and no further action is needed at this time. She will continue SOZO screenings. These are done every 3 months for 2 years post operatively followed by every 6 months for 2 years, and then annually.   L-DEX FLOWSHEETS - 04/19/24 0800       L-DEX LYMPHEDEMA SCREENING   Measurement Type Unilateral    L-DEX MEASUREMENT EXTREMITY Upper Extremity    POSITION  Standing    DOMINANT SIDE Right    At Risk Side Right    BASELINE SCORE (UNILATERAL) -1.6    L-DEX SCORE (UNILATERAL) 0.4    VALUE CHANGE (UNILAT) 2         P: Cont every 3 month L-Dex screens until 06/2025, then transition to 6 months until 06/2027.   Berwyn Knights, PTA 04/19/24 8:40 AM

## 2024-04-20 ENCOUNTER — Other Ambulatory Visit: Payer: Self-pay | Admitting: Cardiology

## 2024-04-20 ENCOUNTER — Ambulatory Visit: Admitting: Hematology & Oncology

## 2024-04-20 ENCOUNTER — Ambulatory Visit

## 2024-04-20 ENCOUNTER — Inpatient Hospital Stay

## 2024-04-20 ENCOUNTER — Other Ambulatory Visit

## 2024-04-21 DIAGNOSIS — G4733 Obstructive sleep apnea (adult) (pediatric): Secondary | ICD-10-CM | POA: Diagnosis not present

## 2024-04-26 ENCOUNTER — Other Ambulatory Visit: Payer: Self-pay | Admitting: Family

## 2024-04-26 DIAGNOSIS — E782 Mixed hyperlipidemia: Secondary | ICD-10-CM

## 2024-04-26 DIAGNOSIS — M818 Other osteoporosis without current pathological fracture: Secondary | ICD-10-CM

## 2024-04-26 DIAGNOSIS — I4821 Permanent atrial fibrillation: Secondary | ICD-10-CM

## 2024-04-26 NOTE — Telephone Encounter (Signed)
 Prescription refill request for Eliquis  received. Indication:afib Last office visit:5/25 Scr:0.68  10/25 Age: 73 Weight:59.6  kg  Prescription refilled

## 2024-04-27 ENCOUNTER — Inpatient Hospital Stay

## 2024-04-27 ENCOUNTER — Inpatient Hospital Stay (HOSPITAL_BASED_OUTPATIENT_CLINIC_OR_DEPARTMENT_OTHER): Admitting: Hematology & Oncology

## 2024-04-27 ENCOUNTER — Encounter: Payer: Self-pay | Admitting: *Deleted

## 2024-04-27 ENCOUNTER — Other Ambulatory Visit: Payer: Self-pay

## 2024-04-27 VITALS — BP 117/71 | Temp 98.2°F | Resp 18 | Ht 64.0 in | Wt 133.0 lb

## 2024-04-27 DIAGNOSIS — Z170 Estrogen receptor positive status [ER+]: Secondary | ICD-10-CM | POA: Diagnosis not present

## 2024-04-27 DIAGNOSIS — C50911 Malignant neoplasm of unspecified site of right female breast: Secondary | ICD-10-CM

## 2024-04-27 DIAGNOSIS — C50011 Malignant neoplasm of nipple and areola, right female breast: Secondary | ICD-10-CM

## 2024-04-27 DIAGNOSIS — D696 Thrombocytopenia, unspecified: Secondary | ICD-10-CM | POA: Diagnosis not present

## 2024-04-27 DIAGNOSIS — Z1731 Human epidermal growth factor receptor 2 positive status: Secondary | ICD-10-CM | POA: Diagnosis not present

## 2024-04-27 DIAGNOSIS — D510 Vitamin B12 deficiency anemia due to intrinsic factor deficiency: Secondary | ICD-10-CM

## 2024-04-27 DIAGNOSIS — Z7901 Long term (current) use of anticoagulants: Secondary | ICD-10-CM

## 2024-04-27 LAB — VITAMIN B12: Vitamin B-12: 506 pg/mL (ref 180–914)

## 2024-04-27 LAB — CMP (CANCER CENTER ONLY)
ALT: 30 U/L (ref 0–44)
AST: 46 U/L — ABNORMAL HIGH (ref 15–41)
Albumin: 4.1 g/dL (ref 3.5–5.0)
Alkaline Phosphatase: 107 U/L (ref 38–126)
Anion gap: 13 (ref 5–15)
BUN: 7 mg/dL — ABNORMAL LOW (ref 8–23)
CO2: 25 mmol/L (ref 22–32)
Calcium: 9 mg/dL (ref 8.9–10.3)
Chloride: 101 mmol/L (ref 98–111)
Creatinine: 0.75 mg/dL (ref 0.44–1.00)
GFR, Estimated: >60 mL/min (ref >60)
Glucose, Bld: 109 mg/dL — ABNORMAL HIGH (ref 70–99)
Potassium: 3.8 mmol/L (ref 3.5–5.1)
Sodium: 139 mmol/L (ref 135–145)
Total Bilirubin: 1.7 mg/dL — ABNORMAL HIGH (ref 0.0–1.2)
Total Protein: 7.1 g/dL (ref 6.5–8.1)

## 2024-04-27 LAB — CBC WITH DIFFERENTIAL (CANCER CENTER ONLY)
Abs Immature Granulocytes: 0.01 K/uL (ref 0.00–0.07)
Basophils Absolute: 0 K/uL (ref 0.0–0.1)
Basophils Relative: 1 %
Eosinophils Absolute: 0.1 K/uL (ref 0.0–0.5)
Eosinophils Relative: 2 %
HCT: 36.9 % (ref 36.0–46.0)
Hemoglobin: 12.7 g/dL (ref 12.0–15.0)
Immature Granulocytes: 0 %
Lymphocytes Relative: 41 %
Lymphs Abs: 1.6 K/uL (ref 0.7–4.0)
MCH: 36.6 pg — ABNORMAL HIGH (ref 26.0–34.0)
MCHC: 34.4 g/dL (ref 30.0–36.0)
MCV: 106.3 fL — ABNORMAL HIGH (ref 80.0–100.0)
Monocytes Absolute: 0.5 K/uL (ref 0.1–1.0)
Monocytes Relative: 13 %
Neutro Abs: 1.7 K/uL (ref 1.7–7.7)
Neutrophils Relative %: 43 %
Platelet Count: 68 K/uL — ABNORMAL LOW (ref 150–400)
RBC: 3.47 MIL/uL — ABNORMAL LOW (ref 3.87–5.11)
RDW: 14.9 % (ref 11.5–15.5)
WBC Count: 4 K/uL (ref 4.0–10.5)
nRBC: 0 % (ref 0.0–0.2)

## 2024-04-27 LAB — LACTATE DEHYDROGENASE: LDH: 230 U/L (ref 105–235)

## 2024-04-27 MED ORDER — CYANOCOBALAMIN 1000 MCG/ML IJ SOLN
1000.0000 ug | Freq: Once | INTRAMUSCULAR | Status: AC
  Administered 2024-04-27: 1000 ug via INTRAMUSCULAR
  Filled 2024-04-27: qty 1

## 2024-04-27 NOTE — Progress Notes (Signed)
 Hematology and Oncology Follow Up Visit  Rhonda Paul [id number] [birth year] 73 y.o. 04/27/2024   Principle Diagnosis:  Stage IA ([id number]) invasive ductal carcinoma of the right breast -- ER+/PR-/HER2+ Remote history of stage I-triple negative-cancer of the right breast-2004 Pernicious anemia  Previous Therapy: Status post right mastectomy --07/04/2023 Adjuvant Carbo/Taxotere /Herceptin / --s/p cycle #4/4 -- start on 08/11/2023  Current Therapy:   Kadcyla  3.6 mg/m2 IV q 3 weeks -- s/p cycle #7 - start on 11/24/2023 --DC on 04/27/2024 for thrombocytopenia Femara  2.5 mg po q day -- start on 11/18/2023 Prolia 60 mg SQ every 6 months -next dose 08/2024 Vitamin B12 1000 mcg IM monthly     Interim History:  Rhonda Paul is back for follow-up.  Unfortunately, I think what happened to stop the Kadcyla .  Her platelet count just does not coming up adequately.  I just do not want to see her with a permanent thrombocytopenia that will affect her quality of life and her other aspects of life..  She does have atrial fibrillation.  She is on Eliquis .  Again with the thrombocytopenia, I want to try to decrease her risk of bleeding.  I think it is can be okay for us  to stop the Kadcyla  now.  She has had a good amount of Kadcyla .  She has had a chemotherapy with Herceptin .  We really have given her quite a bit of anti-HER2 therapy.  We will not have her on Femara .  I think this is appropriate for her.  She had a very early stage breast cancer.  She has had no other issues.  She has had no problems with bowels or bladder.  She has had no leg swelling.  She has had no nausea or vomiting.  She has had no cough.  She has had no headache.  Overall, I would have to say that her performance status is probably ECOG 1.        Wt Readings from Last 3 Encounters:  04/27/24 133 lb (60.3 kg)  04/19/24 131 lb 6 oz (59.6 kg)  04/05/24 134 lb 13.1 oz (61.2 kg)     Medications:  Current Outpatient Medications:     apixaban  (ELIQUIS ) 5 MG TABS tablet, TAKE 1 TABLET BY MOUTH 2 TIMES A DAY, Disp: 180 tablet, Rfl: 1   atorvastatin  (LIPITOR ) 40 MG tablet, TAKE 1 TABLET BY MOUTH DAILY, Disp: 90 tablet, Rfl: 3   Biotin w/ Vitamins C & E (HAIR/SKIN/NAILS PO), Take 1 capsule by mouth daily., Disp: , Rfl:    Cholecalciferol 50 MCG (2000 UT) CAPS, Take 1 capsule by mouth daily., Disp: , Rfl:    dapagliflozin  propanediol (FARXIGA ) 10 MG TABS tablet, TAKE 1 TABLET BY MOUTH DAILY BEFORE BREAKFAST, Disp: 90 tablet, Rfl: 3   ezetimibe  (ZETIA ) 10 MG tablet, Take 1 tablet (10 mg total) by mouth daily., Disp: 90 tablet, Rfl: 3   ipratropium (ATROVENT ) 0.03 % nasal spray, Place 1 spray into both nostrils daily., Disp: , Rfl:    letrozole  (FEMARA ) 2.5 MG tablet, Take 1 tablet (2.5 mg total) by mouth daily., Disp: 30 tablet, Rfl: 12   levothyroxine  (SYNTHROID ) 50 MCG tablet, TAKE 1 TABLET BY MOUTH DAILY, Disp: 90 tablet, Rfl: 0   lidocaine -prilocaine  (EMLA ) cream, Apply 1 Application topically as needed. Apply one hour prior to port access and cover with plastic wrap, Disp: 30 g, Rfl: 0   metoprolol  succinate (TOPROL -XL) 50 MG 24 hr tablet, Take 1 tablet (50 mg total) by mouth daily. Take with or  immediately following a meal., Disp: 90 tablet, Rfl: 3   nitroGLYCERIN  (NITROSTAT ) 0.4 MG SL tablet, Place 1 tablet (0.4 mg total) under the tongue every 5 (five) minutes as needed for chest pain., Disp: 25 tablet, Rfl: 3   NON FORMULARY, Pt uses cpap nightly, Disp: , Rfl:   Allergies:  Allergies  Allergen Reactions   [place] Hives, Itching and Nausea And Vomiting   Entresto  [Sacubitril -Valsartan ] Other (See Comments)    Low BP   Lanolin Acid [Lanolin] Rash   Other Itching and Rash    wool    Past Medical History, Surgical history, Social history, and Family History were reviewed and updated.  Review of Systems: Review of Systems  Constitutional: Negative.   HENT:  Negative.    Eyes: Negative.   Respiratory:  Negative.    Cardiovascular: Negative.   Gastrointestinal: Negative.   Endocrine: Negative.   Genitourinary: Negative.  Negative for difficulty urinating.   Musculoskeletal: Negative.   Skin: Negative.   Neurological: Negative.   Hematological: Negative.   Psychiatric/Behavioral: Negative.      Physical Exam:  height is 5' 4 (1.626 m) and weight is 133 lb (60.3 kg). Her oral temperature is 98.2 F (36.8 C). Her blood pressure is 117/71. Her respiration is 18 and oxygen saturation is 100%.   Wt Readings from Last 3 Encounters:  04/27/24 133 lb (60.3 kg)  04/19/24 131 lb 6 oz (59.6 kg)  04/05/24 134 lb 13.1 oz (61.2 kg)    Physical Exam Vitals reviewed.  HENT:     Head: Normocephalic and atraumatic.  Eyes:     Pupils: Pupils are equal, round, and reactive to light.  Cardiovascular:     Rate and Rhythm: Normal rate and regular rhythm.     Heart sounds: Normal heart sounds.  Pulmonary:     Effort: Pulmonary effort is normal.     Breath sounds: Normal breath sounds.  Abdominal:     General: Bowel sounds are normal.     Palpations: Abdomen is soft.  Musculoskeletal:        General: No tenderness or deformity. Normal range of motion.     Cervical back: Normal range of motion.  Lymphadenopathy:     Cervical: No cervical adenopathy.  Skin:    General: Skin is warm and dry.     Findings: No bruising, erythema or rash.  Neurological:     Mental Status: She is alert and oriented to person, place, and time.  Psychiatric:        Behavior: Behavior normal.        Thought Content: Thought content normal.        Judgment: Judgment normal.      Lab Results  Component Value Date   WBC 4.0 04/27/2024   HGB 12.7 04/27/2024   HCT 36.9 04/27/2024   MCV 106.3 (H) 04/27/2024   PLT 68 (L) 04/27/2024     Chemistry      Component Value Date/Time   NA 139 04/27/2024 0920   NA 141 03/24/2023 0832   NA 142 01/30/2017 0847   NA 140 02/01/2016 0841   K 3.8 04/27/2024 0920   K  4.9 (H) 01/30/2017 0847   K 4.8 02/01/2016 0841   CL 101 04/27/2024 0920   CL 104 01/30/2017 0847   CO2 25 04/27/2024 0920   CO2 32 01/30/2017 0847   CO2 27 02/01/2016 0841   BUN 7 (L) 04/27/2024 0920   BUN 9 03/24/2023 0832   BUN  11 01/30/2017 0847   BUN 12.7 02/01/2016 0841   CREATININE 0.75 04/27/2024 0920   CREATININE 1.0 01/30/2017 0847   CREATININE 0.8 02/01/2016 0841      Component Value Date/Time   CALCIUM  9.0 04/27/2024 0920   CALCIUM  9.6 01/30/2017 0847   CALCIUM  10.0 02/01/2016 0841   ALKPHOS 107 04/27/2024 0920   ALKPHOS 51 01/30/2017 0847   ALKPHOS 59 02/01/2016 0841   AST 46 (H) 04/27/2024 0920   AST 22 02/01/2016 0841   ALT 30 04/27/2024 0920   ALT 25 01/30/2017 0847   ALT 25 02/01/2016 0841   BILITOT 1.7 (H) 04/27/2024 0920   BILITOT 0.93 02/01/2016 0841      Impression and Plan: Ms. Dragoo is a very nice 73 year old white female.  She had a remote history of early-stage breast cancer of the right breast.  She had adjuvant chemotherapy for that.  She subsequently was found to have a new breast cancer in the right breast.  This cancer is HER2 positive./ER positive.  She underwent mastectomy.  We gave her adjuvant chemotherapy.  She completed this in May 2025.    We had her on Kadcyla  as a maintenance.  Again, she has had this thrombocytopenia that is not resolving.  Again I just do not think that we need to push things too hard and then have her have permanent thrombocytopenia that may impact any therapy down the road.  I do think that she still has a very high chance of cure with what we have done already we have been aggressive with her therapy.  We have treated her pretty much on schedule.  I would like to go ahead and get her back probably in December.  I just want to follow-up with her.  If all is good at that point, then we will try to move her appointments out a little bit longer.  Maude JONELLE Crease, MD 11/18/202512:11 PM

## 2024-04-27 NOTE — Progress Notes (Signed)
 Patient will discontinue Kadcyla  after repeated issues with thrombocytopenia. She will continue on Femara . Patient has had no recent navigation needs and as such will discontinue active navigation at this time, but be available to the patient as needed in the future.   Oncology Nurse Navigator Documentation     04/27/2024    9:45 AM  Oncology Nurse Navigator Flowsheets  Navigation Complete Date: 04/27/2024  Post Navigation: Continue to Follow Patient? No  Reason Not Navigating Patient: Patient On Maintenance Chemotherapy  Navigator Location [facility]  Navigator Encounter Type Follow-up Appt  Patient Visit Type MedOnc  Treatment Phase Active Tx  Barriers/Navigation Needs No Barriers At This Time  Interventions None Required  Acuity Level 1-No Barriers  Support Groups/Services Friends and Family  Time Spent with Patient 15

## 2024-04-27 NOTE — Patient Instructions (Addendum)
 Vitamin B12 Injection What is this medication? Vitamin B12 (VAHY tuh min B12) prevents and treats low vitamin B12 levels in your body. It is used in people who do not get enough vitamin B12 from their diet or when their digestive tract does not absorb enough. Vitamin B12 plays an important role in maintaining the health of your nervous system and red blood cells. This medicine may be used for other purposes; ask your health care provider or pharmacist if you have questions. COMMON BRAND NAME(S): B-12 Compliance Kit, B-12 Injection Kit, Cyomin, Dodex, LA-12, Nutri-Twelve, Physicians EZ Use B-12, Primabalt, Vitamin Deficiency Injectable System - B12 What should I tell my care team before I take this medication? They need to know if you have any of these conditions: Kidney disease Leber's disease Megaloblastic anemia An unusual or allergic reaction to cyanocobalamin, cobalt, other medications, foods, dyes, or preservatives Pregnant or trying to get pregnant Breast-feeding How should I use this medication? This medication is injected into a muscle or deeply under the skin. It is usually given in a clinic or care team's office. However, your care team may teach you how to inject yourself. Follow all instructions. Talk to your care team about the use of this medication in children. Special care may be needed. Overdosage: If you think you have taken too much of this medicine contact a poison control center or emergency room at once. NOTE: This medicine is only for you. Do not share this medicine with others. What if I miss a dose? If you are given your dose at a clinic or care team's office, call to reschedule your appointment. If you give your own injections, and you miss a dose, take it as soon as you can. If it is almost time for your next dose, take only that dose. Do not take double or extra doses. What may interact with this medication? Alcohol Colchicine This list may not describe all possible  interactions. Give your health care provider a list of all the medicines, herbs, non-prescription drugs, or dietary supplements you use. Also tell them if you smoke, drink alcohol, or use illegal drugs. Some items may interact with your medicine. What should I watch for while using this medication? Visit your care team regularly. You may need blood work done while you are taking this medication. You may need to follow a special diet. Talk to your care team. Limit your alcohol intake and avoid smoking to get the best benefit. What side effects may I notice from receiving this medication? Side effects that you should report to your care team as soon as possible: Allergic reactions--skin rash, itching, hives, swelling of the face, lips, tongue, or throat Swelling of the ankles, hands, or feet Trouble breathing Side effects that usually do not require medical attention (report to your care team if they continue or are bothersome): Diarrhea This list may not describe all possible side effects. Call your doctor for medical advice about side effects. You may report side effects to FDA at 1-800-FDA-1088. Where should I keep my medication? Keep out of the reach of children. Store at room temperature between 15 and 30 degrees C (59 and 85 degrees F). Protect from light. Throw away any unused medication after the expiration date. NOTE: This sheet is a summary. It may not cover all possible information. If you have questions about this medicine, talk to your doctor, pharmacist, or health care provider.  2024 Elsevier/Gold Standard (2021-02-06 00:00:00)Vitamin B12 Deficiency Vitamin B12 deficiency means that your  body does not have enough vitamin B12. The body needs this important vitamin: To make red blood cells. To make genes (DNA). To help the nerves work. If you do not have enough vitamin B12 in your body, you can have health problems, such as not having enough red blood cells in the blood  (anemia). What are the causes? Not eating enough foods that contain vitamin B12. Not being able to take in (absorb) vitamin B12 from the food that you eat. Certain diseases. A condition in which the body does not make enough of a certain protein. This results in your body not taking in enough vitamin B12. Having a surgery in which part of the stomach or small intestine is taken out. Taking medicines that make it hard for the body to take in vitamin B12. These include: Heartburn medicines. Some medicines that are used to treat diabetes. What increases the risk? Being an older adult. Eating a vegetarian or vegan diet that does not include any foods that come from animals. Not eating enough foods that contain vitamin B12 while you are pregnant. Taking certain medicines. Having alcoholism. What are the signs or symptoms? In some cases, there are no symptoms. If the condition leads to too few blood cells or nerve damage, symptoms can occur, such as: Feeling weak or tired. Not being hungry. Losing feeling (numbness) or tingling in your hands and feet. Redness and burning of the tongue. Feeling sad (depressed). Confusion or memory problems. Trouble walking. If anemia is very bad, symptoms can include: Being short of breath. Being dizzy. Having a very fast heartbeat. How is this treated? Changing the way you eat and drink, such as: Eating more foods that contain vitamin B12. Drinking little or no alcohol. Getting vitamin B12 shots. Taking vitamin B12 supplements by mouth (orally). Your doctor will tell you the dose that is best for you. Follow these instructions at home: Eating and drinking  Eat foods that come from animals and have a lot of vitamin B12 in them. These include: Meats and poultry. This includes beef, pork, chicken, turkey, and organ meats, such as liver. Seafood, such as clams, rainbow trout, salmon, tuna, and haddock. Eggs. Dairy foods such as milk, yogurt, and  cheese. Eat breakfast cereals that have vitamin B12 added to them (are fortified). Check the label. The items listed above may not be a complete list of foods and beverages you can eat and drink. Contact a dietitian for more information. Alcohol use Do not drink alcohol if: Your doctor tells you not to drink. You are pregnant, may be pregnant, or are planning to become pregnant. If you drink alcohol: Limit how much you have to: 0-1 drink a day for women. 0-2 drinks a day for men. Know how much alcohol is in your drink. In the [place] one drink equals one 12 oz bottle of beer (355 mL), one 5 oz glass of wine (148 mL), or one 1 oz glass of hard liquor (44 mL). General instructions Get any vitamin B12 shots if told by your doctor. Take supplements only as told by your doctor. Follow the directions. Keep all follow-up visits. Contact a doctor if: Your symptoms come back. Your symptoms get worse or do not get better with treatment. Get help right away if: You have trouble breathing. You have a very fast heartbeat. You have chest pain. You get dizzy. You faint. These symptoms may be an emergency. Get help right away. Call 911. Do not wait to see if the symptoms  will go away. Do not drive yourself to the hospital. Summary Vitamin B12 deficiency means that your body is not getting enough of the vitamin. In some cases, there are no symptoms of this condition. Treatment may include making a change in the way you eat and drink, getting shots, or taking supplements. Eat foods that have vitamin B12 in them. This information is not intended to replace advice given to you by your health care provider. Make sure you discuss any questions you have with your health care provider. Document Revised: 01/19/2021 Document Reviewed: 01/19/2021 Elsevier Patient Education  2024 [employer].

## 2024-04-28 LAB — CANCER ANTIGEN 27.29: CA 27.29: 15.7 U/mL (ref 0.0–38.6)

## 2024-04-29 ENCOUNTER — Ambulatory Visit: Payer: Self-pay | Admitting: Hematology & Oncology

## 2024-05-11 ENCOUNTER — Inpatient Hospital Stay

## 2024-05-11 ENCOUNTER — Inpatient Hospital Stay: Admitting: Hematology & Oncology

## 2024-05-12 ENCOUNTER — Ambulatory Visit
Admission: RE | Admit: 2024-05-12 | Discharge: 2024-05-12 | Disposition: A | Source: Ambulatory Visit | Attending: Family Medicine | Admitting: Family Medicine

## 2024-05-12 DIAGNOSIS — Z1231 Encounter for screening mammogram for malignant neoplasm of breast: Secondary | ICD-10-CM

## 2024-05-18 ENCOUNTER — Inpatient Hospital Stay: Admitting: Hematology & Oncology

## 2024-05-18 ENCOUNTER — Inpatient Hospital Stay

## 2024-06-01 ENCOUNTER — Inpatient Hospital Stay

## 2024-06-01 ENCOUNTER — Inpatient Hospital Stay: Attending: Hematology & Oncology

## 2024-06-01 ENCOUNTER — Ambulatory Visit: Payer: Self-pay | Admitting: Hematology & Oncology

## 2024-06-01 ENCOUNTER — Other Ambulatory Visit: Payer: Self-pay

## 2024-06-01 ENCOUNTER — Inpatient Hospital Stay: Admitting: Hematology & Oncology

## 2024-06-01 VITALS — BP 127/80 | Temp 98.2°F | Resp 18 | Ht 64.0 in | Wt 132.0 lb

## 2024-06-01 DIAGNOSIS — D510 Vitamin B12 deficiency anemia due to intrinsic factor deficiency: Secondary | ICD-10-CM | POA: Diagnosis present

## 2024-06-01 DIAGNOSIS — Z79811 Long term (current) use of aromatase inhibitors: Secondary | ICD-10-CM | POA: Insufficient documentation

## 2024-06-01 DIAGNOSIS — Z170 Estrogen receptor positive status [ER+]: Secondary | ICD-10-CM | POA: Diagnosis not present

## 2024-06-01 DIAGNOSIS — Z1731 Human epidermal growth factor receptor 2 positive status: Secondary | ICD-10-CM | POA: Diagnosis not present

## 2024-06-01 DIAGNOSIS — C50911 Malignant neoplasm of unspecified site of right female breast: Secondary | ICD-10-CM | POA: Insufficient documentation

## 2024-06-01 DIAGNOSIS — C50011 Malignant neoplasm of nipple and areola, right female breast: Secondary | ICD-10-CM | POA: Diagnosis not present

## 2024-06-01 LAB — CMP (CANCER CENTER ONLY)
ALT: 37 U/L (ref 0–44)
AST: 44 U/L — ABNORMAL HIGH (ref 15–41)
Albumin: 4.1 g/dL (ref 3.5–5.0)
Alkaline Phosphatase: 97 U/L (ref 38–126)
Anion gap: 12 (ref 5–15)
BUN: 10 mg/dL (ref 8–23)
CO2: 27 mmol/L (ref 22–32)
Calcium: 9.1 mg/dL (ref 8.9–10.3)
Chloride: 101 mmol/L (ref 98–111)
Creatinine: 0.72 mg/dL (ref 0.44–1.00)
GFR, Estimated: >60 mL/min
Glucose, Bld: 101 mg/dL — ABNORMAL HIGH (ref 70–99)
Potassium: 4 mmol/L (ref 3.5–5.1)
Sodium: 140 mmol/L (ref 135–145)
Total Bilirubin: 1.3 mg/dL — ABNORMAL HIGH (ref 0.0–1.2)
Total Protein: 7 g/dL (ref 6.5–8.1)

## 2024-06-01 LAB — CBC WITH DIFFERENTIAL (CANCER CENTER ONLY)
Abs Immature Granulocytes: 0.01 K/uL (ref 0.00–0.07)
Basophils Absolute: 0.1 K/uL (ref 0.0–0.1)
Basophils Relative: 1 %
Eosinophils Absolute: 0.1 K/uL (ref 0.0–0.5)
Eosinophils Relative: 2 %
HCT: 36.2 % (ref 36.0–46.0)
Hemoglobin: 12.6 g/dL (ref 12.0–15.0)
Immature Granulocytes: 0 %
Lymphocytes Relative: 43 %
Lymphs Abs: 1.7 K/uL (ref 0.7–4.0)
MCH: 37.1 pg — ABNORMAL HIGH (ref 26.0–34.0)
MCHC: 34.8 g/dL (ref 30.0–36.0)
MCV: 106.5 fL — ABNORMAL HIGH (ref 80.0–100.0)
Monocytes Absolute: 0.4 K/uL (ref 0.1–1.0)
Monocytes Relative: 9 %
Neutro Abs: 1.7 K/uL (ref 1.7–7.7)
Neutrophils Relative %: 45 %
Platelet Count: 97 K/uL — ABNORMAL LOW (ref 150–400)
RBC: 3.4 MIL/uL — ABNORMAL LOW (ref 3.87–5.11)
RDW: 13.9 % (ref 11.5–15.5)
WBC Count: 4 K/uL (ref 4.0–10.5)
nRBC: 0 % (ref 0.0–0.2)

## 2024-06-01 LAB — VITAMIN B12: Vitamin B-12: 442 pg/mL (ref 180–914)

## 2024-06-01 MED ORDER — CYANOCOBALAMIN 1000 MCG/ML IJ SOLN: 1000.0000 ug | Freq: Once | INTRAMUSCULAR | Status: DC

## 2024-06-01 MED ORDER — CYANOCOBALAMIN 1000 MCG/ML IJ SOLN
1000.0000 ug | Freq: Once | INTRAMUSCULAR | Status: AC
  Administered 2024-06-01: 1000 ug via INTRAMUSCULAR
  Filled 2024-06-01: qty 1

## 2024-06-01 NOTE — Patient Instructions (Signed)
 Vitamin B12 Injection What is this medication? Vitamin B12 (VAHY tuh min B12) prevents and treats low vitamin B12 levels in your body. It is used in people who do not get enough vitamin B12 from their diet or when their digestive tract does not absorb enough. Vitamin B12 plays an important role in maintaining the health of your nervous system and red blood cells. This medicine may be used for other purposes; ask your health care provider or pharmacist if you have questions. COMMON BRAND NAME(S): B-12 Compliance Kit, B-12 Injection Kit, Cyomin, Dodex, LA-12, Nutri-Twelve, Physicians EZ Use B-12, Primabalt, Vitamin Deficiency Injectable System - B12 What should I tell my care team before I take this medication? They need to know if you have any of these conditions: Kidney disease Leber's disease Megaloblastic anemia An unusual or allergic reaction to cyanocobalamin, cobalt, other medications, foods, dyes, or preservatives Pregnant or trying to get pregnant Breast-feeding How should I use this medication? This medication is injected into a muscle or deeply under the skin. It is usually given in a clinic or care team's office. However, your care team may teach you how to inject yourself. Follow all instructions. Talk to your care team about the use of this medication in children. Special care may be needed. Overdosage: If you think you have taken too much of this medicine contact a poison control center or emergency room at once. NOTE: This medicine is only for you. Do not share this medicine with others. What if I miss a dose? If you are given your dose at a clinic or care team's office, call to reschedule your appointment. If you give your own injections, and you miss a dose, take it as soon as you can. If it is almost time for your next dose, take only that dose. Do not take double or extra doses. What may interact with this medication? Alcohol Colchicine This list may not describe all possible  interactions. Give your health care provider a list of all the medicines, herbs, non-prescription drugs, or dietary supplements you use. Also tell them if you smoke, drink alcohol, or use illegal drugs. Some items may interact with your medicine. What should I watch for while using this medication? Visit your care team regularly. You may need blood work done while you are taking this medication. You may need to follow a special diet. Talk to your care team. Limit your alcohol intake and avoid smoking to get the best benefit. What side effects may I notice from receiving this medication? Side effects that you should report to your care team as soon as possible: Allergic reactions--skin rash, itching, hives, swelling of the face, lips, tongue, or throat Swelling of the ankles, hands, or feet Trouble breathing Side effects that usually do not require medical attention (report to your care team if they continue or are bothersome): Diarrhea This list may not describe all possible side effects. Call your doctor for medical advice about side effects. You may report side effects to FDA at 1-800-FDA-1088. Where should I keep my medication? Keep out of the reach of children. Store at room temperature between 15 and 30 degrees C (59 and 85 degrees F). Protect from light. Throw away any unused medication after the expiration date. NOTE: This sheet is a summary. It may not cover all possible information. If you have questions about this medicine, talk to your doctor, pharmacist, or health care provider.  2024 Elsevier/Gold Standard (2021-02-06 00:00:00)Vitamin B12 Deficiency Vitamin B12 deficiency means that your  body does not have enough vitamin B12. The body needs this important vitamin: To make red blood cells. To make genes (DNA). To help the nerves work. If you do not have enough vitamin B12 in your body, you can have health problems, such as not having enough red blood cells in the blood  (anemia). What are the causes? Not eating enough foods that contain vitamin B12. Not being able to take in (absorb) vitamin B12 from the food that you eat. Certain diseases. A condition in which the body does not make enough of a certain protein. This results in your body not taking in enough vitamin B12. Having a surgery in which part of the stomach or small intestine is taken out. Taking medicines that make it hard for the body to take in vitamin B12. These include: Heartburn medicines. Some medicines that are used to treat diabetes. What increases the risk? Being an older adult. Eating a vegetarian or vegan diet that does not include any foods that come from animals. Not eating enough foods that contain vitamin B12 while you are pregnant. Taking certain medicines. Having alcoholism. What are the signs or symptoms? In some cases, there are no symptoms. If the condition leads to too few blood cells or nerve damage, symptoms can occur, such as: Feeling weak or tired. Not being hungry. Losing feeling (numbness) or tingling in your hands and feet. Redness and burning of the tongue. Feeling sad (depressed). Confusion or memory problems. Trouble walking. If anemia is very bad, symptoms can include: Being short of breath. Being dizzy. Having a very fast heartbeat. How is this treated? Changing the way you eat and drink, such as: Eating more foods that contain vitamin B12. Drinking little or no alcohol. Getting vitamin B12 shots. Taking vitamin B12 supplements by mouth (orally). Your doctor will tell you the dose that is best for you. Follow these instructions at home: Eating and drinking  Eat foods that come from animals and have a lot of vitamin B12 in them. These include: Meats and poultry. This includes beef, pork, chicken, turkey, and organ meats, such as liver. Seafood, such as clams, rainbow trout, salmon, tuna, and haddock. Eggs. Dairy foods such as milk, yogurt, and  cheese. Eat breakfast cereals that have vitamin B12 added to them (are fortified). Check the label. The items listed above may not be a complete list of foods and beverages you can eat and drink. Contact a dietitian for more information. Alcohol use Do not drink alcohol if: Your doctor tells you not to drink. You are pregnant, may be pregnant, or are planning to become pregnant. If you drink alcohol: Limit how much you have to: 0-1 drink a day for women. 0-2 drinks a day for men. Know how much alcohol is in your drink. In the [place] one drink equals one 12 oz bottle of beer (355 mL), one 5 oz glass of wine (148 mL), or one 1 oz glass of hard liquor (44 mL). General instructions Get any vitamin B12 shots if told by your doctor. Take supplements only as told by your doctor. Follow the directions. Keep all follow-up visits. Contact a doctor if: Your symptoms come back. Your symptoms get worse or do not get better with treatment. Get help right away if: You have trouble breathing. You have a very fast heartbeat. You have chest pain. You get dizzy. You faint. These symptoms may be an emergency. Get help right away. Call 911. Do not wait to see if the symptoms  will go away. Do not drive yourself to the hospital. Summary Vitamin B12 deficiency means that your body is not getting enough of the vitamin. In some cases, there are no symptoms of this condition. Treatment may include making a change in the way you eat and drink, getting shots, or taking supplements. Eat foods that have vitamin B12 in them. This information is not intended to replace advice given to you by your health care provider. Make sure you discuss any questions you have with your health care provider. Document Revised: 01/19/2021 Document Reviewed: 01/19/2021 Elsevier Patient Education  2024 [employer].

## 2024-06-01 NOTE — Progress Notes (Signed)
 " Hematology and Oncology Follow Up Visit  Rhonda Paul [id number] 05-28-1951 73 y.o. 06/01/2024   Principle Diagnosis:  Stage IA ([id number]) invasive ductal carcinoma of the right breast -- ER+/PR-/HER2+ Remote history of stage I-triple negative-cancer of the right breast-2004 Pernicious anemia  Previous Therapy: Status post right mastectomy --07/04/2023 Adjuvant Carbo/Taxotere /Herceptin / --s/p cycle #4/4 -- start on 08/11/2023  Current Therapy:   Kadcyla  3.6 mg/m2 IV q 3 weeks -- s/p cycle #7 - start on 11/24/2023 --DC on 04/27/2024 for thrombocytopenia Femara  2.5 mg po q day -- start on 11/18/2023 Prolia  60 mg SQ every 6 months -next dose 08/2024 Vitamin B12 1000 mcg IM monthly     Interim History:  Rhonda Paul is back for follow-up.  She is doing quite well.  She had a wonderful time of admission for Thanksgiving.  She will be home for Christmas.  It sounds to be quite Christmas for her.  Her platelet count has come up nicely.  As such, stopping the Kadcyla  has been a good thing for her.  There is been no problems with bleeding.  She is on Eliquis  for her atrial fibrillation.  She has had no problems with arthralgias or myalgias from the Femara .  She has had no issues with bowels or bladder.  She has had no leg swelling.  She has had no headache.  There has been no rashes.  Overall, I would have say that her performance status is by ECOG 0.        Wt Readings from Last 3 Encounters:  04/27/24 133 lb (60.3 kg)  04/19/24 131 lb 6 oz (59.6 kg)  04/05/24 134 lb 13.1 oz (61.2 kg)     Medications:  Current Outpatient Medications:    apixaban  (ELIQUIS ) 5 MG TABS tablet, TAKE 1 TABLET BY MOUTH 2 TIMES A DAY, Disp: 180 tablet, Rfl: 1   atorvastatin  (LIPITOR ) 40 MG tablet, TAKE 1 TABLET BY MOUTH DAILY, Disp: 90 tablet, Rfl: 3   Biotin w/ Vitamins C & E (HAIR/SKIN/NAILS PO), Take 1 capsule by mouth daily., Disp: , Rfl:    Cholecalciferol 50 MCG (2000 UT) CAPS, Take 1 capsule by  mouth daily., Disp: , Rfl:    dapagliflozin  propanediol (FARXIGA ) 10 MG TABS tablet, TAKE 1 TABLET BY MOUTH DAILY BEFORE BREAKFAST, Disp: 90 tablet, Rfl: 3   ezetimibe  (ZETIA ) 10 MG tablet, Take 1 tablet (10 mg total) by mouth daily., Disp: 90 tablet, Rfl: 3   ipratropium (ATROVENT ) 0.03 % nasal spray, Place 1 spray into both nostrils daily., Disp: , Rfl:    letrozole  (FEMARA ) 2.5 MG tablet, Take 1 tablet (2.5 mg total) by mouth daily., Disp: 30 tablet, Rfl: 12   levothyroxine  (SYNTHROID ) 50 MCG tablet, TAKE 1 TABLET BY MOUTH DAILY, Disp: 90 tablet, Rfl: 0   lidocaine -prilocaine  (EMLA ) cream, Apply 1 Application topically as needed. Apply one hour prior to port access and cover with plastic wrap, Disp: 30 g, Rfl: 0   metoprolol  succinate (TOPROL -XL) 50 MG 24 hr tablet, Take 1 tablet (50 mg total) by mouth daily. Take with or immediately following a meal., Disp: 90 tablet, Rfl: 3   nitroGLYCERIN  (NITROSTAT ) 0.4 MG SL tablet, Place 1 tablet (0.4 mg total) under the tongue every 5 (five) minutes as needed for chest pain., Disp: 25 tablet, Rfl: 3   NON FORMULARY, Pt uses cpap nightly, Disp: , Rfl:  No current facility-administered medications for this visit.  Facility-Administered Medications Ordered in Other Visits:    cyanocobalamin  (VITAMIN B12) injection  1,000 mcg, 1,000 mcg, Intramuscular, Once, [place], Sarah M, [place]  Allergies:  Allergies  Allergen Reactions   Lambs Quarters Hives, Itching and Nausea And Vomiting   Entresto  [Sacubitril -Valsartan ] Other (See Comments)    Low BP   Lanolin Acid [Lanolin] Rash   Other Itching and Rash    wool    Past Medical History, Surgical history, Social history, and Family History were reviewed and updated.  Review of Systems: Review of Systems  Constitutional: Negative.   HENT:  Negative.    Eyes: Negative.   Respiratory: Negative.    Cardiovascular: Negative.   Gastrointestinal: Negative.   Endocrine: Negative.   Genitourinary: Negative.   Negative for difficulty urinating.   Musculoskeletal: Negative.   Skin: Negative.   Neurological: Negative.   Hematological: Negative.   Psychiatric/Behavioral: Negative.      Physical Exam:  Her vital signs show temperature of 98.2.  Pulse 79.  Blood pressure 127/80.  Weight is 132 pounds.  Wt Readings from Last 3 Encounters:  04/27/24 133 lb (60.3 kg)  04/19/24 131 lb 6 oz (59.6 kg)  04/05/24 134 lb 13.1 oz (61.2 kg)    Physical Exam Vitals reviewed.  HENT:     Head: Normocephalic and atraumatic.  Eyes:     Pupils: Pupils are equal, round, and reactive to light.  Cardiovascular:     Rate and Rhythm: Normal rate and regular rhythm.     Heart sounds: Normal heart sounds.  Pulmonary:     Effort: Pulmonary effort is normal.     Breath sounds: Normal breath sounds.  Abdominal:     General: Bowel sounds are normal.     Palpations: Abdomen is soft.  Musculoskeletal:        General: No tenderness or deformity. Normal range of motion.     Cervical back: Normal range of motion.  Lymphadenopathy:     Cervical: No cervical adenopathy.  Skin:    General: Skin is warm and dry.     Findings: No bruising, erythema or rash.  Neurological:     Mental Status: She is alert and oriented to person, place, and time.  Psychiatric:        Behavior: Behavior normal.        Thought Content: Thought content normal.        Judgment: Judgment normal.      Lab Results  Component Value Date   WBC 4.0 06/01/2024   HGB 12.6 06/01/2024   HCT 36.2 06/01/2024   MCV 106.5 (H) 06/01/2024   PLT 97 (L) 06/01/2024     Chemistry      Component Value Date/Time   NA 139 04/27/2024 0920   NA 141 03/24/2023 0832   NA 142 01/30/2017 0847   NA 140 02/01/2016 0841   K 3.8 04/27/2024 0920   K 4.9 (H) 01/30/2017 0847   K 4.8 02/01/2016 0841   CL 101 04/27/2024 0920   CL 104 01/30/2017 0847   CO2 25 04/27/2024 0920   CO2 32 01/30/2017 0847   CO2 27 02/01/2016 0841   BUN 7 (L) 04/27/2024 0920    BUN 9 03/24/2023 0832   BUN 11 01/30/2017 0847   BUN 12.7 02/01/2016 0841   CREATININE 0.75 04/27/2024 0920   CREATININE 1.0 01/30/2017 0847   CREATININE 0.8 02/01/2016 0841      Component Value Date/Time   CALCIUM  9.0 04/27/2024 0920   CALCIUM  9.6 01/30/2017 0847   CALCIUM  10.0 02/01/2016 0841   ALKPHOS 107 04/27/2024 0920  ALKPHOS 51 01/30/2017 0847   ALKPHOS 59 02/01/2016 0841   AST 46 (H) 04/27/2024 0920   AST 22 02/01/2016 0841   ALT 30 04/27/2024 0920   ALT 25 01/30/2017 0847   ALT 25 02/01/2016 0841   BILITOT 1.7 (H) 04/27/2024 0920   BILITOT 0.93 02/01/2016 0841      Impression and Plan: Ms. Engen is a very nice 73 year old white female.  She had a remote history of early-stage breast cancer of the right breast.  She had adjuvant chemotherapy for that.  She subsequently was found to have a new breast cancer in the right breast.  This cancer is HER2 positive./ER positive.  She underwent mastectomy.  We gave her adjuvant chemotherapy.  She completed this in May 2025.    At this point, she will just be on Femara .  She should do well with the Femara .  I think we can move her appointments out now.  I feel better that her platelet count is back up.  We will plan to see her back now in about 2 months.   Maude JONELLE Crease, MD 12/23/20258:51 AM "

## 2024-06-01 NOTE — Patient Instructions (Signed)

## 2024-06-15 ENCOUNTER — Other Ambulatory Visit: Payer: Self-pay | Admitting: Cardiology

## 2024-07-19 ENCOUNTER — Ambulatory Visit: Attending: Surgery

## 2024-07-22 ENCOUNTER — Inpatient Hospital Stay

## 2024-07-22 ENCOUNTER — Inpatient Hospital Stay: Admitting: Hematology & Oncology

## 2024-07-22 ENCOUNTER — Inpatient Hospital Stay: Attending: Hematology & Oncology

## 2024-08-23 ENCOUNTER — Ambulatory Visit (HOSPITAL_BASED_OUTPATIENT_CLINIC_OR_DEPARTMENT_OTHER): Admitting: Cardiology
